# Patient Record
Sex: Male | Born: 1965 | Race: White | Hispanic: No | Marital: Married | Smoking: Former smoker
Health system: Southern US, Community
[De-identification: ages and names within clinical notes are randomized; demographics above are authoritative.]

## PROBLEM LIST (undated history)

## (undated) DIAGNOSIS — I25810 Atherosclerosis of coronary artery bypass graft(s) without angina pectoris: Secondary | ICD-10-CM

## (undated) DIAGNOSIS — H409 Unspecified glaucoma: Secondary | ICD-10-CM

## (undated) DIAGNOSIS — E785 Hyperlipidemia, unspecified: Secondary | ICD-10-CM

## (undated) DIAGNOSIS — I10 Essential (primary) hypertension: Secondary | ICD-10-CM

## (undated) HISTORY — DX: Atherosclerosis of coronary artery bypass graft(s) without angina pectoris: I25.810

## (undated) HISTORY — DX: Hyperlipidemia, unspecified: E78.5

## (undated) HISTORY — PX: NO PAST SURGERIES: SHX2092

---

## 2020-05-30 DIAGNOSIS — Z8673 Personal history of transient ischemic attack (TIA), and cerebral infarction without residual deficits: Secondary | ICD-10-CM

## 2020-05-30 HISTORY — DX: Personal history of transient ischemic attack (TIA), and cerebral infarction without residual deficits: Z86.73

## 2020-07-01 ENCOUNTER — Other Ambulatory Visit: Payer: Self-pay

## 2020-07-01 ENCOUNTER — Inpatient Hospital Stay (HOSPITAL_BASED_OUTPATIENT_CLINIC_OR_DEPARTMENT_OTHER)
Admission: EM | Admit: 2020-07-01 | Discharge: 2020-07-13 | DRG: 234 | Disposition: A | Discharge status: Home / Self Care | Payer: BC Managed Care – PPO | Attending: Cardiothoracic Surgery | Admitting: Cardiothoracic Surgery

## 2020-07-01 ENCOUNTER — Encounter (HOSPITAL_BASED_OUTPATIENT_CLINIC_OR_DEPARTMENT_OTHER): Payer: Self-pay | Admitting: *Deleted

## 2020-07-01 ENCOUNTER — Emergency Department (HOSPITAL_BASED_OUTPATIENT_CLINIC_OR_DEPARTMENT_OTHER): Payer: BC Managed Care – PPO

## 2020-07-01 DIAGNOSIS — J939 Pneumothorax, unspecified: Secondary | ICD-10-CM

## 2020-07-01 DIAGNOSIS — E785 Hyperlipidemia, unspecified: Secondary | ICD-10-CM | POA: Diagnosis present

## 2020-07-01 DIAGNOSIS — I2510 Atherosclerotic heart disease of native coronary artery without angina pectoris: Secondary | ICD-10-CM | POA: Diagnosis present

## 2020-07-01 DIAGNOSIS — Z9689 Presence of other specified functional implants: Secondary | ICD-10-CM

## 2020-07-01 DIAGNOSIS — I6522 Occlusion and stenosis of left carotid artery: Secondary | ICD-10-CM | POA: Diagnosis present

## 2020-07-01 DIAGNOSIS — Z01810 Encounter for preprocedural cardiovascular examination: Secondary | ICD-10-CM

## 2020-07-01 DIAGNOSIS — I214 Non-ST elevation (NSTEMI) myocardial infarction: Principal | ICD-10-CM | POA: Diagnosis present

## 2020-07-01 DIAGNOSIS — Z79899 Other long term (current) drug therapy: Secondary | ICD-10-CM

## 2020-07-01 DIAGNOSIS — Z09 Encounter for follow-up examination after completed treatment for conditions other than malignant neoplasm: Secondary | ICD-10-CM

## 2020-07-01 DIAGNOSIS — Z8616 Personal history of COVID-19: Secondary | ICD-10-CM

## 2020-07-01 DIAGNOSIS — I10 Essential (primary) hypertension: Secondary | ICD-10-CM | POA: Diagnosis present

## 2020-07-01 DIAGNOSIS — H409 Unspecified glaucoma: Secondary | ICD-10-CM | POA: Diagnosis present

## 2020-07-01 DIAGNOSIS — E119 Type 2 diabetes mellitus without complications: Secondary | ICD-10-CM | POA: Diagnosis present

## 2020-07-01 DIAGNOSIS — Z951 Presence of aortocoronary bypass graft: Secondary | ICD-10-CM

## 2020-07-01 DIAGNOSIS — E8770 Fluid overload, unspecified: Secondary | ICD-10-CM | POA: Diagnosis not present

## 2020-07-01 DIAGNOSIS — R1310 Dysphagia, unspecified: Secondary | ICD-10-CM | POA: Diagnosis present

## 2020-07-01 DIAGNOSIS — D62 Acute posthemorrhagic anemia: Secondary | ICD-10-CM | POA: Diagnosis not present

## 2020-07-01 DIAGNOSIS — I161 Hypertensive emergency: Principal | ICD-10-CM | POA: Diagnosis present

## 2020-07-01 DIAGNOSIS — J9811 Atelectasis: Secondary | ICD-10-CM

## 2020-07-01 DIAGNOSIS — Z87891 Personal history of nicotine dependence: Secondary | ICD-10-CM

## 2020-07-01 DIAGNOSIS — Z8249 Family history of ischemic heart disease and other diseases of the circulatory system: Secondary | ICD-10-CM

## 2020-07-01 DIAGNOSIS — D6959 Other secondary thrombocytopenia: Secondary | ICD-10-CM | POA: Diagnosis present

## 2020-07-01 HISTORY — DX: Essential (primary) hypertension: I10

## 2020-07-01 HISTORY — DX: Unspecified glaucoma: H40.9

## 2020-07-01 LAB — CBC WITH DIFFERENTIAL/PLATELET
Abs Immature Granulocytes: 0.01 10*3/uL (ref 0.00–0.07)
Basophils Absolute: 0 10*3/uL (ref 0.0–0.1)
Basophils Relative: 0 %
Eosinophils Absolute: 0.2 10*3/uL (ref 0.0–0.5)
Eosinophils Relative: 3 %
HCT: 46.8 % (ref 39.0–52.0)
Hemoglobin: 15.7 g/dL (ref 13.0–17.0)
Immature Granulocytes: 0 %
Lymphocytes Relative: 28 %
Lymphs Abs: 1.9 10*3/uL (ref 0.7–4.0)
MCH: 28.7 pg (ref 26.0–34.0)
MCHC: 33.5 g/dL (ref 30.0–36.0)
MCV: 85.6 fL (ref 80.0–100.0)
Monocytes Absolute: 0.6 10*3/uL (ref 0.1–1.0)
Monocytes Relative: 9 %
Neutro Abs: 4.1 10*3/uL (ref 1.7–7.7)
Neutrophils Relative %: 60 %
Platelets: 291 10*3/uL (ref 150–400)
RBC: 5.47 MIL/uL (ref 4.22–5.81)
RDW: 13.2 % (ref 11.5–15.5)
WBC: 6.9 10*3/uL (ref 4.0–10.5)
nRBC: 0 % (ref 0.0–0.2)

## 2020-07-01 LAB — COMPREHENSIVE METABOLIC PANEL
ALT: 17 U/L (ref 0–44)
AST: 23 U/L (ref 15–41)
Albumin: 4.2 g/dL (ref 3.5–5.0)
Alkaline Phosphatase: 98 U/L (ref 38–126)
Anion gap: 11 (ref 5–15)
BUN: 20 mg/dL (ref 6–20)
CO2: 24 mmol/L (ref 22–32)
Calcium: 9.5 mg/dL (ref 8.9–10.3)
Chloride: 101 mmol/L (ref 98–111)
Creatinine, Ser: 1.09 mg/dL (ref 0.61–1.24)
GFR, Estimated: >60 mL/min (ref >60)
Glucose, Bld: 101 mg/dL — ABNORMAL HIGH (ref 70–99)
Potassium: 4.1 mmol/L (ref 3.5–5.1)
Sodium: 136 mmol/L (ref 135–145)
Total Bilirubin: 0.6 mg/dL (ref 0.3–1.2)
Total Protein: 7.3 g/dL (ref 6.5–8.1)

## 2020-07-01 LAB — D-DIMER, QUANTITATIVE: D-Dimer, Quant: 0.62 ug/mL-FEU — ABNORMAL HIGH (ref 0.00–0.50)

## 2020-07-01 LAB — TROPONIN I (HIGH SENSITIVITY)
Troponin I (High Sensitivity): 558 ng/L (ref <18)
Troponin I (High Sensitivity): 601 ng/L (ref <18)

## 2020-07-01 LAB — SARS CORONAVIRUS 2 BY RT PCR (HOSPITAL ORDER, PERFORMED IN ~~LOC~~ HOSPITAL LAB): SARS Coronavirus 2: POSITIVE — AB

## 2020-07-01 MED ORDER — NITROGLYCERIN IN D5W 200-5 MCG/ML-% IV SOLN
0.0000 ug/min | INTRAVENOUS | Status: DC
  Administered 2020-07-01: 5 ug/min via INTRAVENOUS
  Administered 2020-07-03 – 2020-07-04 (×2): 30 ug/min via INTRAVENOUS
  Filled 2020-07-01 (×3): qty 250

## 2020-07-01 MED ORDER — HEPARIN (PORCINE) 25000 UT/250ML-% IV SOLN
1450.0000 [IU]/h | INTRAVENOUS | Status: DC
  Administered 2020-07-01: 1100 [IU]/h via INTRAVENOUS
  Administered 2020-07-02: 1350 [IU]/h via INTRAVENOUS
  Filled 2020-07-01 (×2): qty 250

## 2020-07-01 MED ORDER — HEPARIN BOLUS VIA INFUSION
4000.0000 [IU] | Freq: Once | INTRAVENOUS | Status: AC
  Administered 2020-07-01: 4000 [IU] via INTRAVENOUS

## 2020-07-01 NOTE — Progress Notes (Signed)
ANTICOAGULATION CONSULT NOTE - Initial Consult  Pharmacy Consult for heparin Indication: ACS/STEMI  No Known Allergies  Patient Measurements: Height: 5\' 8"  (172.7 cm) Weight: 104.3 kg (230 lb) IBW/kg (Calculated) : 68.4 Heparin Dosing Weight: 91.1  Vital Signs: Temp: 98.1 F (36.7 C) (02/02 1730) BP: 213/126 (02/02 1812) Pulse Rate: 83 (02/02 1812)  Labs: Recent Labs    07/01/20 1739  HGB 15.7  HCT 46.8  PLT 291  CREATININE 1.09  TROPONINIHS 558*    Estimated Creatinine Clearance: 92.8 mL/min (by C-G formula based on SCr of 1.09 mg/dL).   Medical History: Past Medical History:  Diagnosis Date  . Glaucoma   . Hypertension     Assessment: 55 yo male admitted with L sided chest pain which radiates down his L arm.  Reports shortness of breath x1 week.  Troponin on arrival 558. CBC stable.  Goal of Therapy:  Heparin level 0.3-0.7 units/ml Monitor platelets by anticoagulation protocol: Yes   Plan:  Heparin 4000 units bolus x1, then Start heparin infusion at 1100 units/hr  F/u 6 hour HL Monitor CBC, s/sx bleeding  44, PharmD PGY-1 Acute Care Pharmacy Resident Office: [phone] 07/01/2020 6:59 PM

## 2020-07-01 NOTE — ED Notes (Signed)
Date and time results received: 07/01/20 2002 (use smartphrase ".now" to insert current time)  Test: Trop Critical Value: 601  Name of Provider Notified: Rubin Payor   Orders Received? Or Actions Taken?: none

## 2020-07-01 NOTE — ED Notes (Signed)
Spoke with the patient regarding his BP.  He stated that he used to take BP medicine and he stopped taking it 5 years ago because he feels like a zombie.  He has young children then and he prefer to play with them and he decided to stopped taking his blood pressure medicine.

## 2020-07-01 NOTE — ED Notes (Addendum)
Pt sent from UC for hypertension. No complaints of chest pain at this time. Chest pain was last night around midnight. Heat helped. Pt had covid 3 weeks ago

## 2020-07-01 NOTE — ED Notes (Signed)
ED Provider at bedside. 

## 2020-07-01 NOTE — ED Provider Notes (Signed)
[facility] EMERGENCY DEPARTMENT Provider Note   CSN: [id number] Arrival date & time: 07/01/20  1724     History Chief Complaint  Patient presents with  . Chest Pain    Leonard Donovan is a 55 y.o. male.  HPI Patient presents with chest pain.  Has had for the last around 2 weeks on and off.  Pain-free now.  Pain is a pressure in his mid chest.  Will go to left arm and left neck at times.  Also now covers both sides of the chest and both arms although is pain-free at this time.  He has been on and off.  Does not seem to come on with exertion although 2 days ago did have it come on while he is at work but usually episodes come on whenever they want to including sometimes at rest.  States that they last an hour and a half and then will go away.  Not really feeling shortness of breath.  Although was diagnosed with Covid 3 weeks ago.  Had around 10 days of fevers.  No more fevers.  Has a history of hypertension but not on any medicines.  States he tried to go see his primary care doctor but it been so long since he had seen them they would not accept him as a patient.  Went to urgent care and was sent in here.  Has a pressure of 230/130 upon arrival.  No swelling his legs.  No further cough.  No known cardiac history    Past Medical History:  Diagnosis Date  . Glaucoma   . Hypertension     There are no problems to display for this patient.   History reviewed. No pertinent surgical history.     No family history on file.  Social History   Tobacco Use  . Smoking status: Former Games developer  . Smokeless tobacco: Former Clinical biochemist  . Vaping Use: Never used  Substance Use Topics  . Alcohol use: Not Currently  . Drug use: Not Currently    Home Medications Prior to Admission medications   Medication Sig Start Date End Date Taking? Authorizing Provider  Ascorbic Acid (VITAMIN C) 1000 MG tablet Take 1,000 mg by mouth daily.   Yes [provider]  calcium-vitamin  D (OSCAL WITH D) 500-200 MG-UNIT tablet Take 1 tablet by mouth.   Yes [provider]  folic acid-vitamin b complex-vitamin c-selenium-zinc (DIALYVITE) 3 MG TABS tablet Take 1 tablet by mouth daily.   Yes [provider]  zinc gluconate 50 MG tablet Take 50 mg by mouth daily.   Yes [provider]    Allergies    Patient has no known allergies.  Review of Systems   Review of Systems  Constitutional: Negative for appetite change.  HENT: Negative for congestion.   Respiratory: Negative for cough and shortness of breath.   Cardiovascular: Positive for chest pain.  Gastrointestinal: Negative for abdominal pain.  Genitourinary: Negative for flank pain.  Musculoskeletal: Negative for back pain.  Skin: Negative for rash.  Neurological: Negative for weakness.  Psychiatric/Behavioral: Negative for confusion.    Physical Exam Updated Vital Signs BP (!) 213/126   Pulse 83   Temp 98.1 F (36.7 C)   Resp 14   Ht 5\' 8"  (1.727 m)   Wt 104.3 kg   SpO2 95%   BMI 34.97 kg/m   Physical Exam Vitals and nursing note reviewed.  Constitutional:      Appearance: He  is well-developed.  HENT:     Head: Atraumatic.  Cardiovascular:     Rate and Rhythm: Normal rate and regular rhythm.  Pulmonary:     Breath sounds: No decreased breath sounds, wheezing, rhonchi or rales.  Chest:     Chest wall: No tenderness.  Musculoskeletal:     Cervical back: Normal range of motion and neck supple.     Right lower leg: No tenderness. No edema.     Left lower leg: No tenderness. No edema.  Skin:    Capillary Refill: Capillary refill takes less than 2 seconds.  Neurological:     Mental Status: He is alert and oriented to person, place, and time.     ED Results / Procedures / Treatments   Labs (all labs ordered are listed, but only abnormal results are displayed) Labs Reviewed  COMPREHENSIVE METABOLIC PANEL - Abnormal; Notable for the following components:      Result  Value   Glucose, Bld 101 (*)    All other components within normal limits  D-DIMER, QUANTITATIVE (NOT AT [facility]) - Abnormal; Notable for the following components:   D-Dimer, Quant 0.62 (*)    All other components within normal limits  TROPONIN I (HIGH SENSITIVITY) - Abnormal; Notable for the following components:   Troponin I (High Sensitivity) 558 (*)    All other components within normal limits  SARS CORONAVIRUS 2 BY RT PCR (HOSPITAL ORDER, PERFORMED IN [facility])  CBC WITH DIFFERENTIAL/PLATELET  HEPARIN LEVEL (UNFRACTIONATED)  TROPONIN I (HIGH SENSITIVITY)    EKG EKG Interpretation  Date/Time:  Wednesday July 01 2020 17:29:32 EST Ventricular Rate:  87 PR Interval:  158 QRS Duration: 94 QT Interval:  380 QTC Calculation: 457 R Axis:   -2 Text Interpretation: Normal sinus rhythm Possible Left atrial enlargement Borderline ECG baseline wander in V3. No old tracing to compare Confirmed by Benjiman Core [phone]) on 07/01/2020 6:31:25 PM   Radiology DG Chest 2 View  Result Date: 07/01/2020 CLINICAL DATA:  Chest pain.  Shortness of breath EXAM: CHEST - 2 VIEW COMPARISON:  None. FINDINGS: Heart size is normal. No pleural effusion or edema. Bilateral upper lobe predominant hazy opacities are identified. Subsegmental atelectasis noted in the left base. The visualized osseous structures are unremarkable. IMPRESSION: Bilateral upper lobe predominant hazy opacities concerning for multifocal infection. Electronically Signed   By: Signa Kell M.D.   On: 07/01/2020 18:01    Procedures Procedures   Medications Ordered in ED Medications  nitroGLYCERIN 50 mg in dextrose 5 % 250 mL (0.2 mg/mL) infusion (5 mcg/min Intravenous New Bag/Given 07/01/20 1853)  heparin bolus via infusion 4,000 Units (has no administration in time range)  heparin ADULT infusion 100 units/mL (25000 units/240mL) (1,100 Units/hr Intravenous New Bag/Given 07/01/20 1911)    ED Course  I have reviewed  the triage vital signs and the nursing notes.  Pertinent labs & imaging results that were available during my care of the patient were reviewed by me and considered in my medical decision making (see chart for details).    MDM Rules/Calculators/A&P                          Patient presents with chest pain.  Episodes of it last around an hour and a half.  One event may have been exertional but has exertion without episodes and otherwise come on at rest.  Has exerted himself since the potentially exertional episode without any pain.  Initially mid chest or left chest going to the left arm.  Now had involved both sides the chest and both arms.  Does not go to the back.  Not hypoxic.  Did have Covid around 3 weeks ago.  EKG reassuring but troponin is negative and patient is severely hypertensive. Discussed with Dr. Gaynelle Arabian from cardiology.  Thinks patient would be appropriate for his service.  Will start on nitroglycerin drip and heparin drip.  I think likely either hypertensive emergency versus a Covid myocarditis.  Acute vessel occlusion felt less likely but still considered.  Doubt pulmonary embolism.  D-dimer has been ordered through triage, however I think this is mildly elevated only from the Covid infection I do not going to get a CT angiography at this time. Will admit to Cardiology at [facility].  CRITICAL CARE Performed by: Benjiman Core Total critical care time: 30 minutes Critical care time was exclusive of separately billable procedures and treating other patients. Critical care was necessary to treat or prevent imminent or life-threatening deterioration. Critical care was time spent personally by me on the following activities: development of treatment plan with patient and/or surrogate as well as nursing, discussions with consultants, evaluation of patient's response to treatment, examination of patient, obtaining history from patient or surrogate, ordering and performing treatments  and interventions, ordering and review of laboratory studies, ordering and review of radiographic studies, pulse oximetry and re-evaluation of patient's condition.  Final Clinical Impression(s) / ED Diagnoses Final diagnoses:  Hypertensive emergency  NSTEMI (non-ST elevated myocardial infarction) [facility])    Rx / DC Orders ED Discharge Orders    None       Benjiman Core, MD 07/01/20 1930

## 2020-07-01 NOTE — ED Triage Notes (Signed)
C/o left sided chest pain which radiates Down left arm , SOb  multiple episodes x 1 week

## 2020-07-02 ENCOUNTER — Observation Stay (HOSPITAL_COMMUNITY): Payer: BC Managed Care – PPO

## 2020-07-02 ENCOUNTER — Other Ambulatory Visit (HOSPITAL_BASED_OUTPATIENT_CLINIC_OR_DEPARTMENT_OTHER): Payer: Self-pay

## 2020-07-02 ENCOUNTER — Other Ambulatory Visit: Payer: Self-pay

## 2020-07-02 ENCOUNTER — Encounter (HOSPITAL_COMMUNITY)
Admission: EM | Disposition: A | Discharge status: Home / Self Care | Payer: Self-pay | Source: Home / Self Care | Attending: Cardiology

## 2020-07-02 ENCOUNTER — Encounter (HOSPITAL_COMMUNITY): Payer: Self-pay | Admitting: Cardiology

## 2020-07-02 DIAGNOSIS — E785 Hyperlipidemia, unspecified: Secondary | ICD-10-CM | POA: Diagnosis present

## 2020-07-02 DIAGNOSIS — D6959 Other secondary thrombocytopenia: Secondary | ICD-10-CM | POA: Diagnosis present

## 2020-07-02 DIAGNOSIS — Z951 Presence of aortocoronary bypass graft: Secondary | ICD-10-CM | POA: Diagnosis not present

## 2020-07-02 DIAGNOSIS — Z79899 Other long term (current) drug therapy: Secondary | ICD-10-CM | POA: Diagnosis not present

## 2020-07-02 DIAGNOSIS — I2510 Atherosclerotic heart disease of native coronary artery without angina pectoris: Secondary | ICD-10-CM

## 2020-07-02 DIAGNOSIS — I6522 Occlusion and stenosis of left carotid artery: Secondary | ICD-10-CM | POA: Diagnosis present

## 2020-07-02 DIAGNOSIS — E119 Type 2 diabetes mellitus without complications: Secondary | ICD-10-CM | POA: Diagnosis present

## 2020-07-02 DIAGNOSIS — I214 Non-ST elevation (NSTEMI) myocardial infarction: Secondary | ICD-10-CM

## 2020-07-02 DIAGNOSIS — R1310 Dysphagia, unspecified: Secondary | ICD-10-CM | POA: Diagnosis present

## 2020-07-02 DIAGNOSIS — I161 Hypertensive emergency: Secondary | ICD-10-CM | POA: Diagnosis present

## 2020-07-02 DIAGNOSIS — Z8616 Personal history of COVID-19: Secondary | ICD-10-CM | POA: Diagnosis not present

## 2020-07-02 DIAGNOSIS — E8770 Fluid overload, unspecified: Secondary | ICD-10-CM | POA: Diagnosis not present

## 2020-07-02 DIAGNOSIS — H409 Unspecified glaucoma: Secondary | ICD-10-CM | POA: Diagnosis present

## 2020-07-02 DIAGNOSIS — D62 Acute posthemorrhagic anemia: Secondary | ICD-10-CM | POA: Diagnosis not present

## 2020-07-02 DIAGNOSIS — I10 Essential (primary) hypertension: Secondary | ICD-10-CM | POA: Diagnosis present

## 2020-07-02 DIAGNOSIS — Z8249 Family history of ischemic heart disease and other diseases of the circulatory system: Secondary | ICD-10-CM

## 2020-07-02 DIAGNOSIS — Z87891 Personal history of nicotine dependence: Secondary | ICD-10-CM | POA: Diagnosis not present

## 2020-07-02 DIAGNOSIS — E782 Mixed hyperlipidemia: Secondary | ICD-10-CM | POA: Diagnosis not present

## 2020-07-02 DIAGNOSIS — I25110 Atherosclerotic heart disease of native coronary artery with unstable angina pectoris: Secondary | ICD-10-CM | POA: Diagnosis not present

## 2020-07-02 DIAGNOSIS — Z1810 Retained metal fragments, unspecified: Secondary | ICD-10-CM | POA: Diagnosis not present

## 2020-07-02 HISTORY — PX: LEFT HEART CATH AND CORONARY ANGIOGRAPHY: CATH118249

## 2020-07-02 LAB — ECHOCARDIOGRAM COMPLETE
Area-P 1/2: 2.66 cm2
Height: 69 in
S' Lateral: 3.1 cm
Weight: 3489.6 oz

## 2020-07-02 LAB — CBC
HCT: 45.1 % (ref 39.0–52.0)
Hemoglobin: 14.7 g/dL (ref 13.0–17.0)
MCH: 28.7 pg (ref 26.0–34.0)
MCHC: 32.6 g/dL (ref 30.0–36.0)
MCV: 88.1 fL (ref 80.0–100.0)
Platelets: 240 10*3/uL (ref 150–400)
RBC: 5.12 MIL/uL (ref 4.22–5.81)
RDW: 13.3 % (ref 11.5–15.5)
WBC: 8.9 10*3/uL (ref 4.0–10.5)
nRBC: 0 % (ref 0.0–0.2)

## 2020-07-02 LAB — TROPONIN I (HIGH SENSITIVITY): Troponin I (High Sensitivity): 271 ng/L (ref <18)

## 2020-07-02 LAB — HEMOGLOBIN A1C
Hgb A1c MFr Bld: 5.7 % — ABNORMAL HIGH (ref 4.8–5.6)
Mean Plasma Glucose: 116.89 mg/dL

## 2020-07-02 LAB — HEPARIN LEVEL (UNFRACTIONATED)
Heparin Unfractionated: 0.19 IU/mL — ABNORMAL LOW (ref 0.30–0.70)
Heparin Unfractionated: 0.29 IU/mL — ABNORMAL LOW (ref 0.30–0.70)

## 2020-07-02 SURGERY — LEFT HEART CATH AND CORONARY ANGIOGRAPHY
Anesthesia: LOCAL

## 2020-07-02 MED ORDER — HYDRALAZINE HCL 20 MG/ML IJ SOLN
INTRAMUSCULAR | Status: DC | PRN
  Administered 2020-07-02 (×2): 10 mg via INTRAVENOUS

## 2020-07-02 MED ORDER — SODIUM CHLORIDE 0.9 % IV SOLN: 250.0000 mL | INTRAVENOUS | Status: DC | PRN

## 2020-07-02 MED ORDER — HYDRALAZINE HCL 20 MG/ML IJ SOLN
INTRAMUSCULAR | Status: AC
  Filled 2020-07-02: qty 1

## 2020-07-02 MED ORDER — ASPIRIN EC 81 MG PO TBEC
81.0000 mg | DELAYED_RELEASE_TABLET | Freq: Every day | ORAL | Status: DC
  Administered 2020-07-03 – 2020-07-07 (×5): 81 mg via ORAL
  Filled 2020-07-02 (×5): qty 1

## 2020-07-02 MED ORDER — FENTANYL CITRATE (PF) 100 MCG/2ML IJ SOLN
INTRAMUSCULAR | Status: DC | PRN
  Administered 2020-07-02: 25 ug via INTRAVENOUS

## 2020-07-02 MED ORDER — ASPIRIN 300 MG RE SUPP
300.0000 mg | RECTAL | Status: AC
  Filled 2020-07-02: qty 1

## 2020-07-02 MED ORDER — ATORVASTATIN CALCIUM 80 MG PO TABS
80.0000 mg | ORAL_TABLET | Freq: Every day | ORAL | Status: DC
  Administered 2020-07-02 – 2020-07-13 (×11): 80 mg via ORAL
  Filled 2020-07-02 (×11): qty 1

## 2020-07-02 MED ORDER — HYDRALAZINE HCL 20 MG/ML IJ SOLN: 10.0000 mg | INTRAMUSCULAR | Status: AC | PRN

## 2020-07-02 MED ORDER — MIDAZOLAM HCL 2 MG/2ML IJ SOLN
INTRAMUSCULAR | Status: DC | PRN
  Administered 2020-07-02: 2 mg via INTRAVENOUS

## 2020-07-02 MED ORDER — SODIUM CHLORIDE 0.9% FLUSH
3.0000 mL | Freq: Two times a day (BID) | INTRAVENOUS | Status: DC
  Administered 2020-07-05 – 2020-07-07 (×4): 3 mL via INTRAVENOUS

## 2020-07-02 MED ORDER — MIDAZOLAM HCL 2 MG/2ML IJ SOLN
INTRAMUSCULAR | Status: AC
  Filled 2020-07-02: qty 2

## 2020-07-02 MED ORDER — LIDOCAINE HCL (PF) 1 % IJ SOLN
INTRAMUSCULAR | Status: AC
  Filled 2020-07-02: qty 30

## 2020-07-02 MED ORDER — HEPARIN (PORCINE) IN NACL 1000-0.9 UT/500ML-% IV SOLN
INTRAVENOUS | Status: DC | PRN
  Administered 2020-07-02 (×2): 500 mL

## 2020-07-02 MED ORDER — ASPIRIN 81 MG PO CHEW
324.0000 mg | CHEWABLE_TABLET | ORAL | Status: AC
  Administered 2020-07-02: 324 mg via ORAL
  Filled 2020-07-02: qty 4

## 2020-07-02 MED ORDER — ACETAMINOPHEN 325 MG PO TABS: 650.0000 mg | ORAL_TABLET | ORAL | Status: DC | PRN

## 2020-07-02 MED ORDER — HEPARIN (PORCINE) IN NACL 1000-0.9 UT/500ML-% IV SOLN
INTRAVENOUS | Status: AC
  Filled 2020-07-02: qty 1000

## 2020-07-02 MED ORDER — ONDANSETRON HCL 4 MG/2ML IJ SOLN: 4.0000 mg | Freq: Four times a day (QID) | INTRAMUSCULAR | Status: DC | PRN

## 2020-07-02 MED ORDER — NITROGLYCERIN 0.4 MG SL SUBL: 0.4000 mg | SUBLINGUAL_TABLET | SUBLINGUAL | Status: DC | PRN

## 2020-07-02 MED ORDER — VERAPAMIL HCL 2.5 MG/ML IV SOLN
INTRAVENOUS | Status: AC
  Filled 2020-07-02: qty 2

## 2020-07-02 MED ORDER — SODIUM CHLORIDE 0.9% FLUSH: 3.0000 mL | INTRAVENOUS | Status: DC | PRN

## 2020-07-02 MED ORDER — IOHEXOL 350 MG/ML SOLN
INTRAVENOUS | Status: DC | PRN
  Administered 2020-07-02: 70 mL

## 2020-07-02 MED ORDER — HEPARIN (PORCINE) 25000 UT/250ML-% IV SOLN
1450.0000 [IU]/h | INTRAVENOUS | Status: DC
  Filled 2020-07-02: qty 250

## 2020-07-02 MED ORDER — CARVEDILOL 3.125 MG PO TABS
3.1250 mg | ORAL_TABLET | Freq: Two times a day (BID) | ORAL | Status: DC
  Administered 2020-07-02 – 2020-07-04 (×4): 3.125 mg via ORAL
  Filled 2020-07-02 (×4): qty 1

## 2020-07-02 MED ORDER — LIDOCAINE HCL (PF) 1 % IJ SOLN
INTRAMUSCULAR | Status: DC | PRN
  Administered 2020-07-02: 2 mL

## 2020-07-02 MED ORDER — LABETALOL HCL 5 MG/ML IV SOLN: 10.0000 mg | INTRAVENOUS | Status: AC | PRN

## 2020-07-02 MED ORDER — VERAPAMIL HCL 2.5 MG/ML IV SOLN
INTRAVENOUS | Status: DC | PRN
  Administered 2020-07-02: 10 mL via INTRA_ARTERIAL

## 2020-07-02 MED ORDER — HEPARIN BOLUS VIA INFUSION
2000.0000 [IU] | Freq: Once | INTRAVENOUS | Status: AC
  Administered 2020-07-02: 2000 [IU] via INTRAVENOUS

## 2020-07-02 MED ORDER — ACETAMINOPHEN 500 MG PO TABS
1000.0000 mg | ORAL_TABLET | Freq: Once | ORAL | Status: AC
  Administered 2020-07-02: 1000 mg via ORAL
  Filled 2020-07-02: qty 2

## 2020-07-02 MED ORDER — AMLODIPINE BESYLATE 5 MG PO TABS
5.0000 mg | ORAL_TABLET | Freq: Every day | ORAL | Status: DC
  Administered 2020-07-02 – 2020-07-06 (×5): 5 mg via ORAL
  Filled 2020-07-02 (×5): qty 1

## 2020-07-02 MED ORDER — HEPARIN (PORCINE) 25000 UT/250ML-% IV SOLN
2000.0000 [IU]/h | INTRAVENOUS | Status: DC
  Administered 2020-07-02: 22:00:00 1450 [IU]/h via INTRAVENOUS
  Administered 2020-07-03: 1700 [IU]/h via INTRAVENOUS
  Administered 2020-07-04 – 2020-07-08 (×7): 2000 [IU]/h via INTRAVENOUS
  Filled 2020-07-02 (×13): qty 250

## 2020-07-02 MED ORDER — FENTANYL CITRATE (PF) 100 MCG/2ML IJ SOLN
INTRAMUSCULAR | Status: AC
  Filled 2020-07-02: qty 2

## 2020-07-02 MED ORDER — SODIUM CHLORIDE 0.9 % IV SOLN: INTRAVENOUS | Status: AC

## 2020-07-02 SURGICAL SUPPLY — 11 items
BAG SNAP BAND KOVER 36X36 (MISCELLANEOUS) ×2 IMPLANT
CATH 5FR JL3.5 JR4 ANG PIG MP (CATHETERS) ×2 IMPLANT
COVER DOME SNAP 22 D (MISCELLANEOUS) ×2 IMPLANT
DEVICE RAD COMP TR BAND LRG (VASCULAR PRODUCTS) ×2 IMPLANT
GLIDESHEATH SLEND SS 6F .021 (SHEATH) ×2 IMPLANT
GUIDEWIRE INQWIRE 1.5J.035X260 (WIRE) ×1 IMPLANT
INQWIRE 1.5J .035X260CM (WIRE) ×2
KIT HEART LEFT (KITS) ×2 IMPLANT
PACK CARDIAC CATHETERIZATION (CUSTOM PROCEDURE TRAY) ×2 IMPLANT
TRANSDUCER W/STOPCOCK (MISCELLANEOUS) ×2 IMPLANT
TUBING CIL FLEX 10 FLL-RA (TUBING) ×2 IMPLANT

## 2020-07-02 NOTE — ED Notes (Signed)
Report given to [facility] at [place].

## 2020-07-02 NOTE — Interval H&P Note (Signed)
Cath Lab Visit (complete for each Cath Lab visit)  Clinical Evaluation Leading to the Procedure:   ACS: Yes.    Non-ACS:    Anginal Classification: CCS IV  Anti-ischemic medical therapy: Minimal Therapy (1 class of medications)  Non-Invasive Test Results: No non-invasive testing performed  Prior CABG: No previous CABG      History and Physical Interval Note:  07/02/2020 3:11 PM  Leonard Donovan  has presented today for surgery, with the diagnosis of NSTEMI.  The various methods of treatment have been discussed with the patient and family. After consideration of risks, benefits and other options for treatment, the patient has consented to  Procedure(s): LEFT HEART CATH AND CORONARY ANGIOGRAPHY (N/A) as a surgical intervention.  The patient's history has been reviewed, patient examined, no change in status, stable for surgery.  I have reviewed the patient's chart and labs.  Questions were answered to the patient's satisfaction.     Lance Muss

## 2020-07-02 NOTE — Progress Notes (Signed)
ANTICOAGULATION CONSULT NOTE   Pharmacy Consult for heparin Indication: ACS/STEMI  No Known Allergies  Patient Measurements: Height: 5\' 9"  (175.3 cm) Weight: 98.9 kg (218 lb 1.6 oz) IBW/kg (Calculated) : 70.7 Heparin Dosing Weight: 91 kg  Vital Signs: Temp: 97.9 F (36.6 C) (02/03 0957) Temp Source: Oral (02/03 0957) BP: 189/108 (02/03 1100) Pulse Rate: 66 (02/03 1100)  Labs: Recent Labs    07/01/20 1739 07/01/20 1913 07/02/20 0115 07/02/20 0955  HGB 15.7  --   --   --   HCT 46.8  --   --   --   PLT 291  --   --   --   HEPARINUNFRC  --   --  0.19* 0.29*  CREATININE 1.09  --   --   --   TROPONINIHS 558* 601*  --  271*    Estimated Creatinine Clearance: 91.9 mL/min (by C-G formula based on SCr of 1.09 mg/dL).  Assessment: 55 yo male admitted with L sided chest pain which radiates down his L arm.  Reports shortness of breath x1 week.  Troponin on arrival 558. CBC stable.  Heparin level just below goal (0.29) on IV heparin at 1350 units/hr. No issues with line or bleeding reported per RN.  Goal of Therapy:  Heparin level 0.3-0.7 units/ml Monitor platelets by anticoagulation protocol: Yes   Plan:  Increase IV heparin to 1450 units/hr. Continue daily heparin level and CBC. F/u plans for IV heparin after cath today.  44, Reece Leader, BCCP Clinical Pharmacist  07/02/2020 1:09 PM   [facility] pharmacy phone numbers are listed on [email or web address]

## 2020-07-02 NOTE — Progress Notes (Signed)
TCTS consulted for CABG evaluation. °

## 2020-07-02 NOTE — Progress Notes (Signed)
ANTICOAGULATION CONSULT NOTE   Pharmacy Consult for heparin Indication: ACS/STEMI  No Known Allergies  Patient Measurements: Height: 5\' 9"  (175.3 cm) Weight: 98.9 kg (218 lb 1.6 oz) IBW/kg (Calculated) : 70.7 Heparin Dosing Weight: 91 kg  Vital Signs: Temp: 98.9 F (37.2 C) (02/03 1400) Temp Source: Oral (02/03 1400) BP: 121/71 (02/03 1800) Pulse Rate: 88 (02/03 1800)  Labs: Recent Labs    07/01/20 1739 07/01/20 1913 07/02/20 0115 07/02/20 0955 07/02/20 1453  HGB 15.7  --   --   --  14.7  HCT 46.8  --   --   --  45.1  PLT 291  --   --   --  240  HEPARINUNFRC  --   --  0.19* 0.29*  --   CREATININE 1.09  --   --   --   --   TROPONINIHS 558* 601*  --  271*  --     Estimated Creatinine Clearance: 91.9 mL/min (by C-G formula based on SCr of 1.09 mg/dL).  Assessment: 55 yo male admitted with L sided chest pain which radiates down his L arm.  Reports shortness of breath x1 week.  Troponin on arrival 558. CBC stable.  Heparin level just below goal (0.29) on IV heparin at 1350 units/hr. No issues with line or bleeding reported per RN.  Pt is s/p cath with multi-vessels dz. Plan for TCTS consult. D/w 44 and will resume heparin 6 hr post sheath removal.   Goal of Therapy:  Heparin level 0.3-0.7 units/ml Monitor platelets by anticoagulation protocol: Yes   Plan:  Resume IV heparin 1450 units/hr at 2130 tonight Continue daily heparin level and CBC.  2131, PharmD, BCIDP, AAHIVP, CPP Infectious Disease Pharmacist 07/02/2020 7:11 PM

## 2020-07-02 NOTE — Progress Notes (Signed)
  Echocardiogram 2D Echocardiogram has been performed.  Leonard Donovan 07/02/2020, 2:22 PM

## 2020-07-02 NOTE — Progress Notes (Signed)
ANTICOAGULATION CONSULT NOTE   Pharmacy Consult for heparin Indication: ACS/STEMI  No Known Allergies  Patient Measurements: Height: 5\' 8"  (172.7 cm) Weight: 104.3 kg (230 lb) IBW/kg (Calculated) : 68.4 Heparin Dosing Weight: 91 kg  Vital Signs: Temp: 98.1 F (36.7 C) (02/02 1730) BP: 165/102 (02/03 0230) Pulse Rate: 59 (02/03 0230)  Labs: Recent Labs    07/01/20 1739 07/01/20 1913 07/02/20 0115  HGB 15.7  --   --   HCT 46.8  --   --   PLT 291  --   --   HEPARINUNFRC  --   --  0.19*  CREATININE 1.09  --   --   TROPONINIHS 558* 601*  --     Estimated Creatinine Clearance: 92.8 mL/min (by C-G formula based on SCr of 1.09 mg/dL).  Assessment: 55 yo male admitted with L sided chest pain which radiates down his L arm.  Reports shortness of breath x1 week.  Troponin on arrival 558. CBC stable.  Heparin level subtherapeutic (0.19) on gtt at 1100 units/hr. No issues with line or bleeding reported per RN.  Goal of Therapy:  Heparin level 0.3-0.7 units/ml Monitor platelets by anticoagulation protocol: Yes   Plan:  Rebolus heparin 2000 units and increase gtt to 1350 units/hr Will f/u 6hr heparin level  44, PharmD, BCPS Please see amion for complete clinical pharmacist phone list 07/02/2020 3:27 AM

## 2020-07-02 NOTE — H&P (Addendum)
Cardiology Admission History and Physical:   Patient ID: LANNIE MOGUL MRN: [id number]; DOB: [birth year]   Admission date: 07/01/2020  Primary Care Provider: Patient, No Pcp Per [facility] Cardiologist: New to [facility]. [facility] Electrophysiologist:  None   Chief Complaint: chest pain  Patient Profile:   BALDEMAR WOLFROM is a 55 y.o. male with a history of hypertension, glaucoma, and recent COVID infection (diagnosed on 06/18/2020) but no known cardiac history although he has not been to a medical provider in about 20 years. He was seen at an Urgent Care yesterday for further evaluation of intermittent chest pain and was found to be markedly hypertensive. He was sent to the ED for further evaluation and ruled in for NSTEMI.   History of Present Illness:   Mr. Gladd is a 55 year old male with a history of hypertension, glaucoma, and recent COVID infection (diagnosed on 06/18/2020) but no known cardiac history.  However, he has not seen a medical provider in probably 20 years until last month when he was [facility].  Although he has a history of hypertension he has not been on any medications.  He recently started taking multiple vitamins after being diagnosed with Covid but is on no other medications at home.  He has no known history of hyperlipidemia or diabetes.  He is a former smoker but quit about 30 years ago.  No other alcohol or drug use.  He does have a family history of heart disease with his sister having known CAD ultimately requiring CABG (first diagnosed with CAD in her 9's).  His father has a history of hypertension.  Patient started having symptoms of COVID on 06/07/2020. Symptoms included fever, fatigue/decreased energy, and mild nasal congestion/cough.  Symptoms persisted and [facility] ultimately [facility] where he tested positive for COVID.  He still has a mild productive cough but otherwise has recovered.  No more fevers.  He  does report intermittent diffuse chest pain over the past 2 weeks that radiates to bilateral shoulders at times.  He describes the pain as a burning sensation and ranks it as an 8 out of 10 at the worst.  He has associated shortness of breath and numbness in his hands with this as well as occasional diaphoresis.  He has also noted some neck/jaw pain he cannot say if it is always related to his chest pain.  No nausea/vomiting.  Pain has been getting worse over the past 2 weeks and becoming more frequent.  It occurs both at rest and with activity.  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  And on Tuesday night he had multiple episodes of pain throughout the night that was worse with laying down.  Given progressive symptoms, he went to an Urgent Care yesterday for further evaluation and was noted to be markedly hypertensive.  Therefore, he was sent to the ED for further work-up.  He is currently chest pain free. He does report some shortness of breath at night when laying flat on his back as well as waking up all of a sudden feeling short of breath.  However it sounds like he probably has undiagnosed sleep apnea rather than true orthopnea/PND.  His wife does report snoring when he lays on his back.  No lower extremity edema.  He has had some intermittent dizziness lately but no syncope.   In the ED, patient  markedly hypertensive with BP as high as 234/128. Otherwise, vitals stable. EKG showed normal sinus rhythm with no acute ischemic changes. High-sensitivity troponin elevated at 558 >> 601. Chest x-ray showed bilateral upper lobe predominant hazy opacities concerning for multifocal infection. WBC 6.9, Hgb 15.7, Plts 13.2. Na 136, K 4.1, Glucose 101, BUN 20, Cr 1.09. LFT normal. D-dimer minimally elevated at 0.62. COVID-19 still positive (but not felt to be acute infection). He was started on IV Heparin and admitted for further evaluation.    Past Medical History:   Diagnosis Date  . Glaucoma   . Hypertension     Past Surgical History:  Procedure Laterality Date  . NO PAST SURGERIES       Medications Prior to Admission: Prior to Admission medications   Medication Sig Start Date End Date Taking? Authorizing Provider  Ascorbic Acid (VITAMIN C) 1000 MG tablet Take 1,000 mg by mouth daily.   Yes [provider]  cholecalciferol (VITAMIN D3) 25 MCG (1000 UNIT) tablet Take 1,000 Units by mouth daily.   Yes [provider]  guaiFENesin (MUCINEX) 600 MG 12 hr tablet Take 600 mg by mouth 2 (two) times daily as needed for cough or to loosen phlegm.   Yes [provider]  Multiple Vitamins-Minerals (MULTIVITAMIN WITH MINERALS) tablet Take 1 tablet by mouth daily.   Yes [provider]  vitamin B-12 (CYANOCOBALAMIN) 1000 MCG tablet Take 1,000 mcg by mouth daily.   Yes [provider]  zinc gluconate 50 MG tablet Take 50 mg by mouth daily.   Yes [provider]     Allergies:   No Known Allergies  Social History:   Social History   Socioeconomic History  . Marital status: Married    Spouse name: Not on file  . Number of children: Not on file  . Years of education: Not on file  . Highest education level: Not on file  Occupational History  . Not on file  Tobacco Use  . Smoking status: Former Games developer  . Smokeless tobacco: Former Clinical biochemist  . Vaping Use: Never used  Substance and Sexual Activity  . Alcohol use: Not Currently  . Drug use: Not Currently  . Sexual activity: Not on file  Other Topics Concern  . Not on file  Social History Narrative  . Not on file   Social Determinants of Health   Financial Resource Strain: Not on file  Food Insecurity: Not on file  Transportation Needs: Not on file  Physical Activity: Not on file  Stress: Not on file  Social Connections: Not on file  Intimate Partner Violence: Not on file    Family History:   The patient's family history includes  CAD in his sister; Heart attack in his sister; Hypertension in his father.    ROS:  Please see the history of present illness.  All other ROS reviewed and negative.     Physical Exam/Data:   Vitals:   07/02/20 0730 07/02/20 0945 07/02/20 0957 07/02/20 1100  BP: (!) 156/98 (!) 187/104 (!) 187/104 (!) 189/108  Pulse: 65 76 76 66  Resp: 19 20 20  (!) 21  Temp:  97.9 F (36.6 C) 97.9 F (36.6 C)   TempSrc:  Oral Oral   SpO2: 96% 98% 98% 96%  Weight:  98.9 kg 98.9 kg   Height:  5\' 9"  (1.753 m) 5\' 9"  (1.753 m)     Intake/Output Summary (Last 24 hours) at 07/02/2020 1224 Last data filed at 07/02/2020  1220 Gross per 24 hour  Intake 282.92 ml  Output 300 ml  Net -17.08 ml   Last 3 Weights 07/02/2020 07/02/2020 07/01/2020  Weight (lbs) 218 lb 1.6 oz 218 lb 1.6 oz 230 lb  Weight (kg) 98.93 kg 98.93 kg 104.327 kg     Body mass index is 32.21 kg/m.  General: 55 y.o. male resting comfortably in no acute distress. HEENT: Normocephalic and atraumatic. Sclera clear.  Neck: Supple. No carotid bruits. No JVD. Heart: RRR. Distinct S1 and S2. No murmurs, gallops, or rubs. Radial pulses 2+ and equal bilaterally. Lungs: No increased work of breathing. Clear to ausculation bilaterally. No wheezes, rhonchi, or rales.  Abdomen: Soft, non-distended, and non-tender to palpation. Bowel sounds present. Extremities: No lower extremity edema.    Skin: Warm and dry. Neuro: Alert and oriented x3. No focal deficits. Psych: Normal affect. Responds appropriately.   EKG:  The ECG that was done was personally reviewed and demonstrates normal sinus rhythm, rate 86 bpm, with mild J point elevation in V2 (not consistent with STEMI) and otherwise non-specific ST/T changes.  Telemetry: Telemetry personally reviewed and demonstrates normal sinus rhythm with rates in the 60's to 70's.  Relevant CV Studies: N/A.  Laboratory Data:  High Sensitivity Troponin:   Recent Labs  Lab 07/01/20 1739 07/01/20 1913   TROPONINIHS 558* 601*      Chemistry Recent Labs  Lab 07/01/20 1739  NA 136  K 4.1  CL 101  CO2 24  GLUCOSE 101*  BUN 20  CREATININE 1.09  CALCIUM 9.5  GFRNONAA >60  ANIONGAP 11    Recent Labs  Lab 07/01/20 1739  PROT 7.3  ALBUMIN 4.2  AST 23  ALT 17  ALKPHOS 98  BILITOT 0.6   Hematology Recent Labs  Lab 07/01/20 1739  WBC 6.9  RBC 5.47  HGB 15.7  HCT 46.8  MCV 85.6  MCH 28.7  MCHC 33.5  RDW 13.2  PLT 291   BNPNo results for input(s): BNP, PROBNP in the last 168 hours.  DDimer  Recent Labs  Lab 07/01/20 1739  DDIMER 0.62*     Radiology/Studies:  DG Chest 2 View  Result Date: 07/01/2020 CLINICAL DATA:  Chest pain.  Shortness of breath EXAM: CHEST - 2 VIEW COMPARISON:  None. FINDINGS: Heart size is normal. No pleural effusion or edema. Bilateral upper lobe predominant hazy opacities are identified. Subsegmental atelectasis noted in the left base. The visualized osseous structures are unremarkable. IMPRESSION: Bilateral upper lobe predominant hazy opacities concerning for multifocal infection. Electronically Signed   By: Signa Kellaylor  Stroud M.D.   On: 07/01/2020 18:01   Assessment and Plan:   NSTEMI vs. Demand Ischemia - Patient presented with intermittent chest pain for the last 2 weeks that has been increasing in severity and frequency. Occurs both at rest and with activity.Currently chest pain free. - EKG showed no acute ischemic changes. - High-sensitivity troponin elevated at 558 >> 601.  - D-dimer minimally elevated at 0.61. - Will order Echo. - Continue IV Heparin. IV Nitro. - Will check lipid panel and hemoglobin A1c. - Will start aspirin, beta-blocker, and high-intensity statin. - Patient has both typical and typical features. Possibly demand ischemia in setting of hypertensive emergency. Also may be a component of myopericarditis given recent COVID infection and worse when laying down. However, I think we need to rule out ACS. Discussed with MD who  agrees. Will proceed with cardiac catheterization. The patient understands that risks include but are not limited to stroke (  1 in 1000), death (1 in 1000), kidney failure [usually temporary] (1 in 500), bleeding (1 in 200), allergic reaction [possibly serious] (1 in 200), and agrees to proceed.   Hypertensive Emergency - BP markedly elevated at 234/128. - Currently on IV Nitro and BP 189/108. Can continue this for now for BP control. - Will add Amlodipine 5mg  daily and Coreg 3.125mg  twice daily.   Recent COVID-19 Infection - Symptoms started on 06/07/2020. [facility]. He a lingering mild cough but otherwise symptoms resolved. No fevers. - D-dimer slightly elevated. - Chest x-ray showed bilateral upper lobe predominant hazy opacities concerning for possible multifocal infection. However, patient afebrile and WBC normal. Patient denies any shortness of breath outside of chest pain episode. Don't think any other work-up is necessary.  Risk Assessment/Risk Scores:   TIMI Risk Score for Unstable Angina or Non-ST Elevation MI:   The patient's TIMI risk score is 2, which indicates a 8% risk of all cause mortality, new or recurrent myocardial infarction or need for urgent revascularization in the next 14 days.{  Severity of Illness: The appropriate patient status for this patient is OBSERVATION. Observation status is judged to be reasonable and necessary in order to provide the required intensity of service to ensure the patient's safety. The patient's presenting symptoms, physical exam findings, and initial radiographic and laboratory data in the context of their medical condition is felt to place them at decreased risk for further clinical deterioration. Furthermore, it is anticipated that the patient will be medically stable for discharge from the hospital within 2 midnights of admission. The following factors support the patient status of observation.   " The  patient's presenting symptoms include chest pain. " The physical exam findings as above. " The initial radiographic and laboratory data are elevated troponin.  For questions or updates, please contact [facility] Please consult [email or web address] for contact info under    Signed, 06/20/2020, PA-C  07/02/2020 12:24 PM   The patient was seen, examined and discussed with 08/30/2020, PA-C  and I agree with the above.   55 y.o. male with a history of hypertension, glaucoma, and recent COVID infection (diagnosed on 06/18/2020) but no known cardiac history although he has not been to a medical provider in about 20 years. He was seen at an Urgent Care yesterday for further evaluation of intermittent chest pain and was found to be markedly hypertensive. He was sent to the ED for further evaluation and ruled in for NSTEMI.  Troponin elevation 558, 601, 271. The patient states that he has family history of premature coronary artery disease, his sister underwent bypass surgery in her 93s, he also has history of smoking about 30 years ago.  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  His symptoms get significantly worse after he acquired Covid infection on January 20 is.  He has been experiencing chest pressure shortness of breath and chest pain radiating down his arms. He is EKG shows sinus rhythm nonspecific ST-T wave abnormalities.  His echocardiogram is pending.  He is normal creatinine, hemoglobin and platelet count.  On presentation he had severely elevated blood pressure. This might represent hypertensive urgency, however with family history of premature coronary artery disease, risk factors and elevated troponin will proceed with cardiac catheterization.  Physical exam he is not in acute distress, he has no JVDs, S1-S2 no  murmur clear lungs and warm extremities with good pulses and no edema.  We will  start aspirin, high-dose statin, low-dose carvedilol, we will start amlodipine 5 mg daily and uptitrate as needed.  We might add these apart depending on his LVEF.  Tobias Alexander, MD 07/02/2020

## 2020-07-03 ENCOUNTER — Encounter (HOSPITAL_COMMUNITY): Payer: Self-pay | Admitting: Interventional Cardiology

## 2020-07-03 ENCOUNTER — Inpatient Hospital Stay (HOSPITAL_COMMUNITY): Payer: BC Managed Care – PPO

## 2020-07-03 DIAGNOSIS — Z1810 Retained metal fragments, unspecified: Secondary | ICD-10-CM

## 2020-07-03 DIAGNOSIS — I214 Non-ST elevation (NSTEMI) myocardial infarction: Secondary | ICD-10-CM | POA: Diagnosis not present

## 2020-07-03 DIAGNOSIS — I161 Hypertensive emergency: Secondary | ICD-10-CM | POA: Diagnosis not present

## 2020-07-03 DIAGNOSIS — Z8616 Personal history of COVID-19: Secondary | ICD-10-CM

## 2020-07-03 DIAGNOSIS — I10 Essential (primary) hypertension: Secondary | ICD-10-CM

## 2020-07-03 DIAGNOSIS — I25110 Atherosclerotic heart disease of native coronary artery with unstable angina pectoris: Secondary | ICD-10-CM

## 2020-07-03 DIAGNOSIS — E782 Mixed hyperlipidemia: Secondary | ICD-10-CM | POA: Diagnosis not present

## 2020-07-03 LAB — BASIC METABOLIC PANEL
Anion gap: 10 (ref 5–15)
BUN: 10 mg/dL (ref 6–20)
CO2: 22 mmol/L (ref 22–32)
Calcium: 8.7 mg/dL — ABNORMAL LOW (ref 8.9–10.3)
Chloride: 107 mmol/L (ref 98–111)
Creatinine, Ser: 1.05 mg/dL (ref 0.61–1.24)
GFR, Estimated: >60 mL/min (ref >60)
Glucose, Bld: 109 mg/dL — ABNORMAL HIGH (ref 70–99)
Potassium: 3.6 mmol/L (ref 3.5–5.1)
Sodium: 139 mmol/L (ref 135–145)

## 2020-07-03 LAB — PULMONARY FUNCTION TEST
FEF 25-75 Pre: 5.6 L/sec
FEF2575-%Pred-Pre: 170 %
FEV1-%Pred-Pre: 99 %
FEV1-Pre: 3.74 L
FEV1FVC-%Pred-Pre: 113 %
FEV6-%Pred-Pre: 90 %
FEV6-Pre: 4.24 L
FEV6FVC-%Pred-Pre: 103 %
FVC-%Pred-Pre: 87 %
FVC-Pre: 4.25 L
Pre FEV1/FVC ratio: 88 %
Pre FEV6/FVC Ratio: 100 %

## 2020-07-03 LAB — HEPARIN LEVEL (UNFRACTIONATED)
Heparin Unfractionated: <0.1 IU/mL — ABNORMAL LOW (ref 0.30–0.70)
Heparin Unfractionated: 0.13 IU/mL — ABNORMAL LOW (ref 0.30–0.70)
Heparin Unfractionated: 0.44 IU/mL (ref 0.30–0.70)

## 2020-07-03 LAB — LIPID PANEL
Cholesterol: 149 mg/dL (ref 0–200)
HDL: 34 mg/dL — ABNORMAL LOW (ref >40)
LDL Cholesterol: 99 mg/dL (ref 0–99)
Total CHOL/HDL Ratio: 4.4 RATIO
Triglycerides: 82 mg/dL (ref <150)
VLDL: 16 mg/dL (ref 0–40)

## 2020-07-03 LAB — CBC
HCT: 41.4 % (ref 39.0–52.0)
Hemoglobin: 13.9 g/dL (ref 13.0–17.0)
MCH: 28.7 pg (ref 26.0–34.0)
MCHC: 33.6 g/dL (ref 30.0–36.0)
MCV: 85.4 fL (ref 80.0–100.0)
Platelets: 198 10*3/uL (ref 150–400)
RBC: 4.85 MIL/uL (ref 4.22–5.81)
RDW: 13.3 % (ref 11.5–15.5)
WBC: 8.1 10*3/uL (ref 4.0–10.5)
nRBC: 0 % (ref 0.0–0.2)

## 2020-07-03 NOTE — Progress Notes (Signed)
CARDIAC REHAB PHASE I   PRE:  Rate/Rhythm: 77 SR    BP: sitting 149/93    SaO2: 96 RA  MODE:  Ambulation: 800 ft   POST:  Rate/Rhythm: 88 SR    BP: sitting 134/103     SaO2: 98 RA  Tolerated well, thankful to walk. No angina, on NTG. Will f/u later for preop (resp in for PFTs). [id number]   Harriet Masson CES, ACSM 07/03/2020 11:41 AM

## 2020-07-03 NOTE — Progress Notes (Signed)
ANTICOAGULATION CONSULT NOTE   Pharmacy Consult for heparin Indication: ACS/STEMI  No Known Allergies  Patient Measurements: Height: 5\' 9"  (175.3 cm) Weight: 96.7 kg (213 lb 3.2 oz) (scale b) IBW/kg (Calculated) : 70.7 Heparin Dosing Weight: 91 kg  Vital Signs: Temp: 97.5 F (36.4 C) (02/04 0313) Temp Source: Oral (02/04 0313) BP: 156/93 (02/04 0313) Pulse Rate: 73 (02/04 0313)  Labs: Recent Labs    07/01/20 1739 07/01/20 1913 07/02/20 0115 07/02/20 0955 07/02/20 1453 07/03/20 0401  HGB 15.7  --   --   --  14.7 13.9  HCT 46.8  --   --   --  45.1 41.4  PLT 291  --   --   --  240 198  HEPARINUNFRC  --   --  0.19* 0.29*  --  <0.10*  CREATININE 1.09  --   --   --   --   --   TROPONINIHS 558* 601*  --  271*  --   --     Estimated Creatinine Clearance: 90.9 mL/min (by C-G formula based on SCr of 1.09 mg/dL).  Assessment: 55 yo male s/p cath with multi-vessels dz. Plan for TCTS consult. Heparin resumed post sheath removal.   Heparin level undetectable on 1450 units/hr. No issues with line or bleeding reported per RN.  Goal of Therapy:  Heparin level 0.3-0.7 units/ml Monitor platelets by anticoagulation protocol: Yes   Plan:  Increase IV heparin to 1700 units/hr Will f/u 6 hr heparin level  44, PharmD, BCPS Please see amion for complete clinical pharmacist phone list 07/03/2020 5:06 AM

## 2020-07-03 NOTE — Progress Notes (Signed)
ANTICOAGULATION CONSULT NOTE   Pharmacy Consult for heparin Indication: ACS/STEMI  No Known Allergies  Patient Measurements: Height: 5\' 9"  (175.3 cm) Weight: 96.7 kg (213 lb 3.2 oz) (scale b) IBW/kg (Calculated) : 70.7 Heparin Dosing Weight: 91 kg  Vital Signs: Temp: 99 F (37.2 C) (02/04 1941) Temp Source: Oral (02/04 1941) BP: 159/87 (02/04 1941) Pulse Rate: 88 (02/04 1941)  Labs: Recent Labs    07/01/20 1739 07/01/20 1913 07/02/20 0115 07/02/20 0955 07/02/20 1453 07/03/20 0401 07/03/20 1202 07/03/20 2150  HGB 15.7  --   --   --  14.7 13.9  --   --   HCT 46.8  --   --   --  45.1 41.4  --   --   PLT 291  --   --   --  240 198  --   --   HEPARINUNFRC  --   --    < > 0.29*  --  <0.10* 0.13* 0.44  CREATININE 1.09  --   --   --   --  1.05  --   --   TROPONINIHS 558* 601*  --  271*  --   --   --   --    < > = values in this interval not displayed.    Estimated Creatinine Clearance: 94.4 mL/min (by C-G formula based on SCr of 1.05 mg/dL).  Assessment: 55 yo male s/p cath with multi-vessels dz.. Heparin resumed post sheath removal. CABG planned for Tuesday  Heparin level therapeutic (0.44) on gtt at 2000 units/hr. No bleeding noted.  Goal of Therapy:  Heparin level 0.3-0.7 units/ml Monitor platelets by anticoagulation protocol: Yes   Plan:  Continue heparin at 2000 units / hr F/u daily heparin level and CBC  Tuesday, PharmD, BCPS Please see amion for complete clinical pharmacist phone list 07/03/2020 10:44 PM

## 2020-07-03 NOTE — Consult Note (Addendum)
[address]       [place] [address]             [phone]        Leonard Donovan [facility] Medical Record #[id number] Date of Birth: [birth year]  Referring: No ref. provider found Primary Care: Patient, No Pcp Per Primary Cardiologist:No primary care provider on file.  Chief Complaint:    Chief Complaint  Patient presents with  . Chest Pain    History of Present Illness:      Leonard Donovan is a 55 year old male patient with a past medical history significant for hypertension, glaucoma, and recent Covid infection which was diagnosed on 06/18/2020.  He has no known history of hyperlipidemia or diabetes.  He is a former smoker but has not smoked in about 30 years.  He does have a positive family history for heart disease with his  sister having known coronary artery disease and ultimately required CABG.  She was first diagnosed with coronary artery disease in her 19s.  He presented on 07/02/2020 to the [facility] emergency department with diffuse chest pain over the past 2 weeks that radiates to bilateral shoulders at times.  His pain has really been occurring for the last two years (since COVID) but it started only with a lot of exertion. He does have associated shortness of breath and numbness in his hands as well as occasional diaphoresis.  The pain is a burning sensation and is an 8 out of 10 at its worst.  The pain had been getting worse over the past 2 weeks and becoming more frequent.  It occurs at both rest and with activity at this point.  He also reports some shortness of breath while lying flat on his back as well as waking up all of a sudden with a feeling of shortness of breath during the night.  Due to his strong family history and symptoms he was taken to the Cath Lab and underwent a cardiac catheterization on 07/02/2020 which showed proximal LAD lesion of 75% stenosis with diffuse disease down the vessel, mid circumflex lesion of 80% with a first OM branch of 75%  stenosis, proximal RCA lesion of 80% stenosis with a right PDA branch of 75% stenosis and an additional right PDA branch with 50% stenosis, lastly a right  atrioventriclar artery with 50% stenosis. Recent Echocardiogram shows EF of 60-65% and mild mitral valve regurgitation with no other valvular abnormalities.  We are consulted for possible surgical revascularization.  Leonard Donovan lives in [place] with his wife and three kids ages 40, 45, and 35. 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 He has lead an active lifestyle until he was diagnosed with COVID in January. He had fevers and shortness of breath with this infection. After about two weeks he began to feel better but his wife continues to worry about post COVID pneumonia. Chest xray was reviewed with the patient and wife at the bedside.     Current Activity/ Functional Status: Patient was independent with mobility/ambulation, transfers, ADL's, IADL's.   Zubrod Score: At the time of surgery this patient's most appropriate activity status/level should be described as: []     0    Normal activity, no symptoms [x]     1    Restricted in physical strenuous activity but ambulatory, able to do out light work []     2    Ambulatory and capable of self care, unable to do work activities,  up and about                 more than 50%  Of the time                            []     3    Only limited self care, in bed greater than 50% of waking hours []     4    Completely disabled, no self care, confined to bed or chair []     5    Moribund  Past Medical History:  Diagnosis Date  . Glaucoma   . Hypertension     Past Surgical History:  Procedure Laterality Date  . LEFT HEART CATH AND CORONARY ANGIOGRAPHY N/A 07/02/2020   Procedure: LEFT HEART CATH AND CORONARY ANGIOGRAPHY;  Surgeon: , MD;  Location: [facility] INVASIVE CV LAB;  Service: Cardiovascular;  Laterality: N/A;  . NO PAST SURGERIES      Social History   Tobacco Use  Smoking  Status Former Smoker  Smokeless Tobacco Former 08/30/2020    Social History   Substance and Sexual Activity  Alcohol Use Not Currently     No Known Allergies  Current Facility-Administered Medications  Medication Dose Route Frequency Provider Last Rate Last Admin  . 0.9 %  sodium chloride infusion  250 mL Intravenous PRN Corky Crafts, MD      . acetaminophen (TYLENOL) tablet 650 mg  650 mg Oral Q4H PRN [facility], MD      . amLODipine (NORVASC) tablet 5 mg  5 mg Oral Daily Neurosurgeon, MD   5 mg at 07/03/20 0910  . aspirin EC tablet 81 mg  81 mg Oral Daily Corky Crafts, MD   81 mg at 07/03/20 0910  . atorvastatin (LIPITOR) tablet 80 mg  80 mg Oral Daily 08/31/20, MD   80 mg at 07/03/20 0910  . carvedilol (COREG) tablet 3.125 mg  3.125 mg Oral BID WC 08/31/20, MD   3.125 mg at 07/03/20 0910  . heparin ADULT infusion 100 units/mL (25000 units/260mL)  1,700 Units/hr Intravenous Continuous Corky Crafts, RPH 17 mL/hr at 07/03/20 0547 1,700 Units/hr at 07/03/20 0547  . nitroGLYCERIN (NITROSTAT) SL tablet 0.4 mg  0.4 mg Sublingual Q5 Min x 3 PRN Titus Mould, MD      . nitroGLYCERIN 50 mg in dextrose 5 % 250 mL (0.2 mg/mL) infusion  0-200 mcg/min Intravenous Continuous 08/31/20, MD 9 mL/hr at 07/03/20 0110 30 mcg/min at 07/03/20 0110  . ondansetron (ZOFRAN) injection 4 mg  4 mg Intravenous Q6H PRN Corky Crafts S, MD      . sodium chloride flush (NS) 0.9 % injection 3 mL  3 mL Intravenous Q12H 08/31/20 S, MD      . sodium chloride flush (NS) 0.9 % injection 3 mL  3 mL Intravenous PRN 08/31/20, MD        Medications Prior to Admission  Medication Sig Dispense Refill Last Dose  . Ascorbic Acid (VITAMIN C) 1000 MG tablet Take 1,000 mg by mouth daily.   06/30/2020  . cholecalciferol (VITAMIN D3) 25 MCG (1000 UNIT) tablet Take 1,000 Units by mouth daily.   06/30/2020  . guaiFENesin (MUCINEX) 600 MG  12 hr tablet Take 600 mg by mouth 2 (two) times daily as needed for cough or to loosen phlegm.   Past Week at Unknown  time  . Multiple Vitamins-Minerals (MULTIVITAMIN WITH MINERALS) tablet Take 1 tablet by mouth daily.   06/30/2020  . vitamin B-12 (CYANOCOBALAMIN) 1000 MCG tablet Take 1,000 mcg by mouth daily.   06/30/2020  . zinc gluconate 50 MG tablet Take 50 mg by mouth daily.   06/30/2020    Family History  Problem Relation Age of Onset  . Hypertension Father   . CAD Sister        diagnosed in her 23s  . Heart attack Sister      Review of Systems:   Review of Systems  Constitutional: Negative for chills, fever, malaise/fatigue and weight loss.  Respiratory: Positive for cough and shortness of breath.   Cardiovascular: Positive for orthopnea and PND. Negative for chest pain and leg swelling.   Pertinent items are noted in HPI.    Physical Exam: BP (!) 148/88 (BP Location: Right Arm)   Pulse 73   Temp 98.8 F (37.1 C) (Oral)   Resp 18   Ht 5\' 9"  (1.753 m)   Wt 96.7 kg Comment: scale b  SpO2 94%   BMI 31.48 kg/m    General appearance: alert, cooperative and no distress Resp: clear to auscultation bilaterally Cardio: regular rate and rhythm, S1, S2 normal, no murmur, click, rub or gallop GI: soft, non-tender; bowel sounds normal; no masses,  no organomegaly Extremities: extremities normal, atraumatic, no cyanosis or edema Neurologic: Grossly normal Right and left legs warm and well perfused. No abnormalities. Will probably work for .   Diagnostic Studies & Laboratory data:     Recent Radiology Findings:   DG Chest 2 View  Result Date: 07/01/2020 CLINICAL DATA:  Chest pain.  Shortness of breath EXAM: CHEST - 2 VIEW COMPARISON:  None. FINDINGS: Heart size is normal. No pleural effusion or edema. Bilateral upper lobe predominant hazy opacities are identified. Subsegmental atelectasis noted in the left base. The visualized osseous structures are unremarkable.  IMPRESSION: Bilateral upper lobe predominant hazy opacities concerning for multifocal infection. Electronically Signed   By: 08/29/2020 M.D.   On: 07/01/2020 18:01   CARDIAC CATHETERIZATION  Result Date: 07/02/2020  1st Mrg lesion is 75% stenosed.  Mid Cx lesion is 80% stenosed.  Prox RCA lesion is 80% stenosed.  Mid LAD lesion is 50% stenosed.  Dist LAD lesion is 75% stenosed.  RPDA-1 lesion is 75% stenosed.  RPDA-2 lesion is 50% stenosed.  RPAV lesion is 50% stenosed.  Prox LAD to Mid LAD lesion is 75% stenosed.  The left ventricular systolic function is normal.  LV end diastolic pressure is normal.  The left ventricular ejection fraction is 55-65% by visual estimate.  There is no aortic valve stenosis.  Diffuse three vessel disease, particularly throughout the LAD.  Plan for cardiac surgery consult.  Distal small vessel disease may affect quality of targets.  I stressed the importance of risk factor modification with the patient including lipid lowering therapy and BP control. Results discussed with his wife, 08/30/2020.   ECHOCARDIOGRAM COMPLETE  Result Date: 07/02/2020    ECHOCARDIOGRAM REPORT   Patient Name:   Leonard Donovan Date of Exam: 07/02/2020 Medical Rec #:  08/30/2020      Height:       69.0 in Accession #:    [id number]     Weight:       218.1 lb Date of Birth:  Sep 01, 1965     BSA:          2.143 m Patient Age:  54 years       BP:           189/108 mmHg Patient Gender: M              HR:           75 bpm. Exam Location:  Inpatient Procedure: 2D Echo, Cardiac Doppler and Color Doppler Indications:    NSTEMI I21.4  History:        Patient has no prior history of Echocardiogram examinations.                 Risk Factors:Hypertension.  Sonographer:    Eulah PontSarah Pirrotta RDCS Referring Phys: [id number] CALLIE E GOODRICH IMPRESSIONS  1. Left ventricular ejection fraction, by estimation, is 60 to 65%. The left ventricle has normal function. The left ventricle has no regional wall motion  abnormalities. Left ventricular diastolic parameters are consistent with Grade II diastolic dysfunction (pseudonormalization). Elevated left atrial pressure.  2. Right ventricular systolic function is normal. The right ventricular size is normal.  3. The mitral valve is grossly normal. Mild mitral valve regurgitation.  4. The aortic valve is normal in structure. Aortic valve regurgitation is not visualized. FINDINGS  Left Ventricle: Left ventricular ejection fraction, by estimation, is 60 to 65%. The left ventricle has normal function. The left ventricle has no regional wall motion abnormalities. The left ventricular internal cavity size was normal in size. There is  no left ventricular hypertrophy. Left ventricular diastolic parameters are consistent with Grade II diastolic dysfunction (pseudonormalization). Elevated left atrial pressure. Right Ventricle: The right ventricular size is normal. Right vetricular wall thickness was not assessed. Right ventricular systolic function is normal. Left Atrium: Left atrial size was normal in size. Right Atrium: Right atrial size was normal in size. Pericardium: There is no evidence of pericardial effusion. Mitral Valve: The mitral valve is grossly normal. Mild mitral valve regurgitation. Tricuspid Valve: The tricuspid valve is normal in structure. Tricuspid valve regurgitation is mild. Aortic Valve: The aortic valve is normal in structure. Aortic valve regurgitation is not visualized. Pulmonic Valve: The pulmonic valve was normal in structure. Pulmonic valve regurgitation is not visualized. Aorta: The aortic root and ascending aorta are structurally normal, with no evidence of dilitation. IAS/Shunts: The interatrial septum was not assessed.  LEFT VENTRICLE PLAX 2D LVIDd:         4.70 cm  Diastology LVIDs:         3.10 cm  LV e' medial:    4.35 cm/s LV PW:         1.00 cm  LV E/e' medial:  20.2 LV IVS:        1.10 cm  LV e' lateral:   3.26 cm/s LVOT diam:     2.10 cm  LV E/e'  lateral: 26.9 LV SV:         85 LV SV Index:   40 LVOT Area:     3.46 cm  RIGHT VENTRICLE RV S prime:     18.50 cm/s TAPSE (M-mode): 2.1 cm LEFT ATRIUM             Index       RIGHT ATRIUM           Index LA diam:        3.50 cm 1.63 cm/m  RA Area:     10.20 cm LA Vol (A2C):   52.7 ml 24.59 ml/m RA Volume:   19.70 ml  9.19 ml/m LA Vol (A4C):   51.1  ml 23.84 ml/m LA Biplane Vol: 54.6 ml 25.47 ml/m  AORTIC VALVE LVOT Vmax:   123.00 cm/s LVOT Vmean:  89.800 cm/s LVOT VTI:    0.245 m  AORTA Ao Root diam: 3.20 cm Ao Asc diam:  3.20 cm MITRAL VALVE MV Area (PHT): 2.66 cm     SHUNTS MV Decel Time: 285 msec     Systemic VTI:  0.24 m MV E velocity: 87.80 cm/s   Systemic Diam: 2.10 cm MV A velocity: 119.00 cm/s MV E/A ratio:  0.74 Dietrich Pates MD Electronically signed by Dietrich Pates MD Signature Date/Time: 07/02/2020/8:02:07 PM    Final      I have independently reviewed the above radiologic studies and discussed with the patient   Recent Lab Findings: Lab Results  Component Value Date   WBC 8.1 07/03/2020   HGB 13.9 07/03/2020   HCT 41.4 07/03/2020   PLT 198 07/03/2020   GLUCOSE 109 (H) 07/03/2020   CHOL 149 07/03/2020   TRIG 82 07/03/2020   HDL 34 (L) 07/03/2020   LDLCALC 99 07/03/2020   ALT 17 07/01/2020   AST 23 07/01/2020   NA 139 07/03/2020   K 3.6 07/03/2020   CL 107 07/03/2020   CREATININE 1.05 07/03/2020   BUN 10 07/03/2020   CO2 22 07/03/2020   HGBA1C 5.7 (H) 07/02/2020      Assessment / Plan:      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 Pain free at the moment. He is on asa, statin, Norvasc and Coreg.  2. Hypertension-has not been on medication in many years due to intolerance. BP moderately controlled on IV nitro and PO medications listed above. 3. Glaucoma- mild, was never on any medications 4. Recent COVID + a month ago. Lingering cough but no other symptoms at this time. On room air with good oxygen saturation. Recent CXR reviewed without pleural effusion. Upper  bilateral hazy opacities mentioned in the CXR report but with no WBC or fevers doubtful this is pneumonia. No CXR pre-COVID to compare to.   Plan: Coronary artery bypass grafting discussed with the patient and wife at the bedside. Possibly scheduled with Dr. Tyrone Sage for Tuesday 2/8. All questions were answered to the patient's and wife's satisfaction. Would recommend PFTs for recent COVID infection.     I  spent 40 minutes counseling the patient face to face.  Jari Favre, PA-C 07/03/2020 9:12 AM  Patient seen, examined, laboratory findings chest x-ray and cine films all reviewed.  Patient presented to the current facility on 68 approximately 530 Wednesday, February 3 after trying to get an appointment because of chest discomfort shoulder pain starting on Tuesday.  He presented with markedly elevated blood pressure 250/128, tachycardia positive cardiac enzymes.  No CTA of the chest was done to rule out dissection.  He was started on heparin and nitroglycerin drips.  Ultimately after delay of 18 hours he was seen by cardiology at [facility] and transferred.  Echocardiogram and cardiac catheterization was done, confirming presence of three-vessel coronary artery disease.  The patient's blood pressure is better controlled now and he is without chest pain.  I have reviewed in detail with he and his wife the findings on the cardiac catheterization and the place for coronary artery bypass grafting with significant three-vessel coronary artery disease symptomatic and non-STEMI presentation.  He does have distal disease both in the circumflex and right coronary distribution but also has significant proximal disease which could not be adequately bypassed.  Risk of surgery  including death infection stroke myocardial infarction bleeding blood transfusion were all discussed.  Currently operative schedule as the patient on for coronary artery bypass grafting on Wednesday morning.  With his acute presentation poorly controlled  blood pressure remaining in the hospital until the time of surgery should be done. I spend 45 min with patient and his wife explaining details.  Delight Ovens MD      [address] [place] [place] [address] Office [phone]

## 2020-07-03 NOTE — Progress Notes (Signed)
Progress Note  Patient Name: Leonard Donovan Date of Encounter: 07/03/2020  [facility] Cardiologist: New patient  Subjective   The patient feels better today he denies any chest pain or shortness of breath.  Inpatient Medications    Scheduled Meds: . amLODipine  5 mg Oral Daily  . aspirin EC  81 mg Oral Daily  . atorvastatin  80 mg Oral Daily  . carvedilol  3.125 mg Oral BID WC  . sodium chloride flush  3 mL Intravenous Q12H   Continuous Infusions: . sodium chloride    . heparin 1,700 Units/hr (07/03/20 0547)  . nitroGLYCERIN 30 mcg/min (07/03/20 0110)   PRN Meds: sodium chloride, acetaminophen, nitroGLYCERIN, ondansetron (ZOFRAN) IV, sodium chloride flush   Vital Signs    Vitals:   07/02/20 1900 07/03/20 0104 07/03/20 0313 07/03/20 0725  BP: 117/76 136/78 (!) 156/93 (!) 148/88  Pulse: 87 74 73 73  Resp: (!) 21 (!) 23 17 18   Temp: 98.6 F (37 C) 98.1 F (36.7 C) (!) 97.5 F (36.4 C) 98.8 F (37.1 C)  TempSrc: Oral  Oral Oral  SpO2: 92% 94% 93% 94%  Weight:   96.7 kg   Height:        Intake/Output Summary (Last 24 hours) at 07/03/2020 1122 Last data filed at 07/03/2020 0850 Gross per 24 hour  Intake 809 ml  Output 1775 ml  Net -966 ml   Last 3 Weights 07/03/2020 07/02/2020 07/02/2020  Weight (lbs) 213 lb 3.2 oz 218 lb 1.6 oz 218 lb 1.6 oz  Weight (kg) 96.707 kg 98.93 kg 98.93 kg      Telemetry    Sinus rhythm with ventricular rates in 70s- Personally Reviewed  ECG    No new tracing- Personally Reviewed  Physical Exam   GEN: No acute distress.   Neck: No JVD Cardiac: RRR, no murmurs, rubs, or gallops.  Respiratory: Clear to auscultation bilaterally. GI: Soft, nontender, non-distended  MS: No edema; No deformity. Neuro:  Nonfocal  Psych: Normal affect   Labs    High Sensitivity Troponin:   Recent Labs  Lab 07/01/20 1739 07/01/20 1913 07/02/20 0955  TROPONINIHS 558* 601* 271*      Chemistry Recent Labs  Lab 07/01/20 1739 07/03/20 0401   NA 136 139  K 4.1 3.6  CL 101 107  CO2 24 22  GLUCOSE 101* 109*  BUN 20 10  CREATININE 1.09 1.05  CALCIUM 9.5 8.7*  PROT 7.3  --   ALBUMIN 4.2  --   AST 23  --   ALT 17  --   ALKPHOS 98  --   BILITOT 0.6  --   GFRNONAA >60 >60  ANIONGAP 11 10     Hematology Recent Labs  Lab 07/01/20 1739 07/02/20 1453 07/03/20 0401  WBC 6.9 8.9 8.1  RBC 5.47 5.12 4.85  HGB 15.7 14.7 13.9  HCT 46.8 45.1 41.4  MCV 85.6 88.1 85.4  MCH 28.7 28.7 28.7  MCHC 33.5 32.6 33.6  RDW 13.2 13.3 13.3  PLT 291 240 198    BNPNo results for input(s): BNP, PROBNP in the last 168 hours.   DDimer  Recent Labs  Lab 07/01/20 1739  DDIMER 0.62*     Radiology    DG Chest 2 View  Result Date: 07/01/2020 CLINICAL DATA:  Chest pain.  Shortness of breath EXAM: CHEST - 2 VIEW COMPARISON:  None. FINDINGS: Heart size is normal. No pleural effusion or edema. Bilateral upper lobe predominant hazy opacities are  identified. Subsegmental atelectasis noted in the left base. The visualized osseous structures are unremarkable. IMPRESSION: Bilateral upper lobe predominant hazy opacities concerning for multifocal infection. Electronically Signed   By: Signa Kell M.D.   On: 07/01/2020 18:01   CARDIAC CATHETERIZATION  Result Date: 07/02/2020  1st Mrg lesion is 75% stenosed.  Mid Cx lesion is 80% stenosed.  Prox RCA lesion is 80% stenosed.  Mid LAD lesion is 50% stenosed.  Dist LAD lesion is 75% stenosed.  RPDA-1 lesion is 75% stenosed.  RPDA-2 lesion is 50% stenosed.  RPAV lesion is 50% stenosed.  Prox LAD to Mid LAD lesion is 75% stenosed.  The left ventricular systolic function is normal.  LV end diastolic pressure is normal.  The left ventricular ejection fraction is 55-65% by visual estimate.  There is no aortic valve stenosis.  Diffuse three vessel disease, particularly throughout the LAD.  Plan for cardiac surgery consult.  Distal small vessel disease may affect quality of targets.  I stressed the  importance of risk factor modification with the patient including lipid lowering therapy and BP control. Results discussed with his wife, Albin Felling.   ECHOCARDIOGRAM COMPLETE: 07/01/2020  1. Left ventricular ejection fraction, by estimation, is 60 to 65%. The  left ventricle has normal function. The left ventricle has no regional  wall motion abnormalities. Left ventricular diastolic parameters are  consistent with Grade II diastolic  dysfunction (pseudonormalization). Elevated left atrial pressure.  2. Right ventricular systolic function is normal. The right ventricular  size is normal.  3. The mitral valve is grossly normal. Mild mitral valve regurgitation.  4. The aortic valve is normal in structure. Aortic valve regurgitation is  not visualized.   Patient Profile     55 y.o. male   Assessment & Plan    CAD, non-STEMI -the patient underwent cardiac catheterization yesterday that showed three-vessel coronary artery disease and was referred to CT surgery that has scheduled him for bypass surgery for next Tuesday, July 07, 2020. He is undergoing preop evaluation including vascular mapping and carotid ultrasound. His LVEF is 60 to 65% with no regional wall motion abnormalities he has grade 2 diastolic dysfunction but no signs of fluid overload on physical exam. New aspirin and high-dose statin with atorvastatin 80 mg daily.  Hypertension -his blood pressure remains elevated, I would increase his carvedilol to 6.25 mg p.o. twice daily, we will continue amlodipine 5 mg daily.  Uptitrate as needed.  Hyperlipidemia -as of above.  For questions or updates, please contact [facility] Please consult [email or web address] for contact info under     Signed, Tobias Alexander, MD  07/03/2020, 11:22 AM

## 2020-07-03 NOTE — Progress Notes (Signed)
ANTICOAGULATION CONSULT NOTE   Pharmacy Consult for heparin Indication: ACS/STEMI  No Known Allergies  Patient Measurements: Height: 5\' 9"  (175.3 cm) Weight: 96.7 kg (213 lb 3.2 oz) (scale b) IBW/kg (Calculated) : 70.7 Heparin Dosing Weight: 91 kg  Vital Signs: Temp: 98.5 F (36.9 C) (02/04 1134) Temp Source: Oral (02/04 1134) BP: 149/93 (02/04 1134) Pulse Rate: 73 (02/04 0725)  Labs: Recent Labs    07/01/20 1739 07/01/20 1913 07/02/20 0115 07/02/20 0955 07/02/20 1453 07/03/20 0401 07/03/20 1202  HGB 15.7  --   --   --  14.7 13.9  --   HCT 46.8  --   --   --  45.1 41.4  --   PLT 291  --   --   --  240 198  --   HEPARINUNFRC  --   --    < > 0.29*  --  <0.10* 0.13*  CREATININE 1.09  --   --   --   --  1.05  --   TROPONINIHS 558* 601*  --  271*  --   --   --    < > = values in this interval not displayed.    Estimated Creatinine Clearance: 94.4 mL/min (by C-G formula based on SCr of 1.05 mg/dL).  Assessment: 55 yo male s/p cath with multi-vessels dz.. Heparin resumed post sheath removal.   Cabg planned for Tuesday  Heparin level still low post cath  Goal of Therapy:  Heparin level 0.3-0.7 units/ml Monitor platelets by anticoagulation protocol: Yes   Plan:  Increase heparin to 2000 units / hr 8 hour heparin level  Thank you Saturday, PharmD 07/03/2020 1:07 PM

## 2020-07-03 NOTE — Progress Notes (Signed)
CARDIAC REHAB PHASE I   Talked with pt and wife about sternal precautions, IS, mobility post op, and d/c planning. Pt was receptive and performed 2500+ on IS. Gave materials for pt to review. Wife and daughter will be with him d/c.   [id number] Harrie Jeans ACSM-EP 07/03/2020 2:43 PM

## 2020-07-03 NOTE — Progress Notes (Signed)
Pre-CABG  Completed    Please see CV Proc for preliminary results.   Clint Guy, RVT

## 2020-07-04 DIAGNOSIS — I214 Non-ST elevation (NSTEMI) myocardial infarction: Secondary | ICD-10-CM | POA: Diagnosis not present

## 2020-07-04 DIAGNOSIS — I161 Hypertensive emergency: Secondary | ICD-10-CM | POA: Diagnosis not present

## 2020-07-04 LAB — CBC
HCT: 40.6 % (ref 39.0–52.0)
Hemoglobin: 13.4 g/dL (ref 13.0–17.0)
MCH: 28.3 pg (ref 26.0–34.0)
MCHC: 33 g/dL (ref 30.0–36.0)
MCV: 85.8 fL (ref 80.0–100.0)
Platelets: 185 10*3/uL (ref 150–400)
RBC: 4.73 MIL/uL (ref 4.22–5.81)
RDW: 13.2 % (ref 11.5–15.5)
WBC: 7.7 10*3/uL (ref 4.0–10.5)
nRBC: 0 % (ref 0.0–0.2)

## 2020-07-04 LAB — BASIC METABOLIC PANEL
Anion gap: 9 (ref 5–15)
BUN: 15 mg/dL (ref 6–20)
CO2: 22 mmol/L (ref 22–32)
Calcium: 8.3 mg/dL — ABNORMAL LOW (ref 8.9–10.3)
Chloride: 107 mmol/L (ref 98–111)
Creatinine, Ser: 1.08 mg/dL (ref 0.61–1.24)
GFR, Estimated: >60 mL/min (ref >60)
Glucose, Bld: 96 mg/dL (ref 70–99)
Potassium: 3.5 mmol/L (ref 3.5–5.1)
Sodium: 138 mmol/L (ref 135–145)

## 2020-07-04 LAB — HEPARIN LEVEL (UNFRACTIONATED): Heparin Unfractionated: 0.48 IU/mL (ref 0.30–0.70)

## 2020-07-04 MED ORDER — CARVEDILOL 12.5 MG PO TABS
12.5000 mg | ORAL_TABLET | Freq: Two times a day (BID) | ORAL | Status: DC
  Administered 2020-07-04 – 2020-07-07 (×7): 12.5 mg via ORAL
  Filled 2020-07-04 (×4): qty 1
  Filled 2020-07-04: qty 2
  Filled 2020-07-04 (×2): qty 1

## 2020-07-04 NOTE — Progress Notes (Signed)
ANTICOAGULATION CONSULT NOTE   Pharmacy Consult for heparin Indication: ACS/STEMI  No Known Allergies  Patient Measurements: Height: 5\' 9"  (175.3 cm) Weight: 96.6 kg (213 lb) IBW/kg (Calculated) : 70.7 Heparin Dosing Weight: 91 kg  Vital Signs: Temp: 98.5 F (36.9 C) (02/05 0725) Temp Source: Oral (02/05 0725) BP: 144/101 (02/05 0725) Pulse Rate: 74 (02/05 0725)  Labs: Recent Labs    07/01/20 1739 07/01/20 1913 07/02/20 0115 07/02/20 0955 07/02/20 1453 07/03/20 0401 07/03/20 1202 07/03/20 2150 07/04/20 0245  HGB 15.7  --   --   --  14.7 13.9  --   --  13.4  HCT 46.8  --   --   --  45.1 41.4  --   --  40.6  PLT 291  --   --   --  240 198  --   --  185  HEPARINUNFRC  --   --    < > 0.29*  --  <0.10* 0.13* 0.44 0.48  CREATININE 1.09  --   --   --   --  1.05  --   --  1.08  TROPONINIHS 558* 601*  --  271*  --   --   --   --   --    < > = values in this interval not displayed.    Estimated Creatinine Clearance: 91.8 mL/min (by C-G formula based on SCr of 1.08 mg/dL).  Assessment: 55 yo male s/p cath with multi-vessel dz. Heparin resumed post sheath removal. CABG planned for Tuesday 2/8.  Heparin level therapeutic at 0.48 on gtt at 2000 units/hr. CBC stable, no bleeding noted.  Goal of Therapy:  Heparin level 0.3-0.7 units/ml Monitor platelets by anticoagulation protocol: Yes   Plan:  Continue heparin at 2000 units / hr F/u daily heparin level and CBC, monitor for s/sx bleeding Follow plans post-CABG  06-19-2005, PharmD PGY1 Acute Care Pharmacy Resident Please refer to [facility] for unit-specific pharmacist

## 2020-07-04 NOTE — Progress Notes (Signed)
Progress Note  Patient Name: Leonard Donovan Date of Encounter: 07/04/2020  [facility] Cardiologist: New patient  Subjective   No events overnight, he is chest pain-free.  Inpatient Medications    Scheduled Meds: . amLODipine  5 mg Oral Daily  . aspirin EC  81 mg Oral Daily  . atorvastatin  80 mg Oral Daily  . carvedilol  3.125 mg Oral BID WC  . sodium chloride flush  3 mL Intravenous Q12H   Continuous Infusions: . sodium chloride    . heparin 2,000 Units/hr (07/04/20 0008)  . nitroGLYCERIN 30 mcg/min (07/04/20 0009)   PRN Meds: sodium chloride, acetaminophen, nitroGLYCERIN, ondansetron (ZOFRAN) IV, sodium chloride flush   Vital Signs    Vitals:   07/03/20 1941 07/04/20 0015 07/04/20 0307 07/04/20 0725  BP: (!) 159/87 (!) 147/103 (!) 142/88 (!) 144/101  Pulse: 88 72 77 74  Resp: (!) 21  20 20   Temp: 99 F (37.2 C) 98 F (36.7 C) 98.8 F (37.1 C) 98.5 F (36.9 C)  TempSrc: Oral Oral Oral Oral  SpO2: 95% 96% 95% 95%  Weight:   96.6 kg   Height:        Intake/Output Summary (Last 24 hours) at 07/04/2020 1050 Last data filed at 07/04/2020 0834 Gross per 24 hour  Intake 1077 ml  Output 400 ml  Net 677 ml   Last 3 Weights 07/04/2020 07/03/2020 07/02/2020  Weight (lbs) 213 lb 213 lb 3.2 oz 218 lb 1.6 oz  Weight (kg) 96.616 kg 96.707 kg 98.93 kg      Telemetry    Sinus rhythm with ventricular rates in 70s- Personally Reviewed  ECG    No new tracing- Personally Reviewed  Physical Exam   GEN: No acute distress.   Neck: No JVD Cardiac: RRR, no murmurs, rubs, or gallops.  Respiratory: Clear to auscultation bilaterally. GI: Soft, nontender, non-distended  MS: No edema; No deformity. Neuro:  Nonfocal  Psych: Normal affect   Labs    High Sensitivity Troponin:   Recent Labs  Lab 07/01/20 1739 07/01/20 1913 07/02/20 0955  TROPONINIHS 558* 601* 271*      Chemistry Recent Labs  Lab 07/01/20 1739 07/03/20 0401 07/04/20 0245  NA 136 139 138  K 4.1  3.6 3.5  CL 101 107 107  CO2 24 22 22   GLUCOSE 101* 109* 96  BUN 20 10 15   CREATININE 1.09 1.05 1.08  CALCIUM 9.5 8.7* 8.3*  PROT 7.3  --   --   ALBUMIN 4.2  --   --   AST 23  --   --   ALT 17  --   --   ALKPHOS 98  --   --   BILITOT 0.6  --   --   GFRNONAA >60 >60 >60  ANIONGAP 11 10 9      Hematology Recent Labs  Lab 07/02/20 1453 07/03/20 0401 07/04/20 0245  WBC 8.9 8.1 7.7  RBC 5.12 4.85 4.73  HGB 14.7 13.9 13.4  HCT 45.1 41.4 40.6  MCV 88.1 85.4 85.8  MCH 28.7 28.7 28.3  MCHC 32.6 33.6 33.0  RDW 13.3 13.3 13.2  PLT 240 198 185    BNPNo results for input(s): BNP, PROBNP in the last 168 hours.   DDimer  Recent Labs  Lab 07/01/20 1739  DDIMER 0.62*     Radiology    DG Chest 2 View  Result Date: 07/01/2020 CLINICAL DATA:  Chest pain.  Shortness of breath EXAM: CHEST - 2  VIEW COMPARISON:  None. FINDINGS: Heart size is normal. No pleural effusion or edema. Bilateral upper lobe predominant hazy opacities are identified. Subsegmental atelectasis noted in the left base. The visualized osseous structures are unremarkable. IMPRESSION: Bilateral upper lobe predominant hazy opacities concerning for multifocal infection. Electronically Signed   By: Signa Kell M.D.   On: 07/01/2020 18:01   CARDIAC CATHETERIZATION  Result Date: 07/02/2020  1st Mrg lesion is 75% stenosed.  Mid Cx lesion is 80% stenosed.  Prox RCA lesion is 80% stenosed.  Mid LAD lesion is 50% stenosed.  Dist LAD lesion is 75% stenosed.  RPDA-1 lesion is 75% stenosed.  RPDA-2 lesion is 50% stenosed.  RPAV lesion is 50% stenosed.  Prox LAD to Mid LAD lesion is 75% stenosed.  The left ventricular systolic function is normal.  LV end diastolic pressure is normal.  The left ventricular ejection fraction is 55-65% by visual estimate.  There is no aortic valve stenosis.  Diffuse three vessel disease, particularly throughout the LAD.  Plan for cardiac surgery consult.  Distal small vessel disease may  affect quality of targets.  I stressed the importance of risk factor modification with the patient including lipid lowering therapy and BP control. Results discussed with his wife, Leonard Donovan.   ECHOCARDIOGRAM COMPLETE: 07/01/2020  1. Left ventricular ejection fraction, by estimation, is 60 to 65%. The  left ventricle has normal function. The left ventricle has no regional  wall motion abnormalities. Left ventricular diastolic parameters are  consistent with Grade II diastolic  dysfunction (pseudonormalization). Elevated left atrial pressure.  2. Right ventricular systolic function is normal. The right ventricular  size is normal.  3. The mitral valve is grossly normal. Mild mitral valve regurgitation.  4. The aortic valve is normal in structure. Aortic valve regurgitation is  not visualized.   Patient Profile     55 y.o. male   Assessment & Plan    CAD, non-STEMI -the patient underwent cardiac catheterization yesterday that showed three-vessel coronary artery disease and was referred to CT surgery that has scheduled him for bypass surgery for next Tuesday, July 07, 2020. He is undergoing preop evaluation including vascular mapping and carotid ultrasound. His LVEF is 60 to 65% with no regional wall motion abnormalities he has grade 2 diastolic dysfunction but no signs of fluid overload on physical exam. -New aspirin and high-dose statin with atorvastatin 80 mg daily. -Discontinue nitroglycerin drip,  -Uptitrate carvedilol to 12.5 mg p.o. twice daily  Hypertension -his blood pressure remains elevated, I would increase his carvedilol to 6.25 mg p.o. twice daily, we will continue amlodipine 5 mg daily. Uptitrate carvedilol to 12.5 mg p.o. twice daily.  Hyperlipidemia -atorvastatin 80 mg daily.  For questions or updates, please contact [facility] Please consult [email or web address] for contact info under     Signed, Tobias Alexander, MD  07/04/2020, 10:50 AM

## 2020-07-04 NOTE — Progress Notes (Signed)
CARDIAC REHAB PHASE I   PRE:  Rate/Rhythm: SR/78  BP:  Sitting: 150/93      SaO2: 96  MODE:  Ambulation: 470 ft 96%/SR  POST:  Rate/Rhythm: SR/97  BP:  Sitting: 161/103      SaO2: 97%  Pr OOB in chair, agrees to walk. Pt voices he has eager to walk all day. Pt ambulated with strong, steady gait and had no C/O SOB, dizziness or pain. Pt back to chair with spouse in room. Call bell at side. Post ambulation BP elevated 161/103: RN Laveda Norman) notified. OOB to chair encouraged.   Lorin Picket, MS, ACSM EP-C, [facility] 07/04/2020  2:43 PM

## 2020-07-05 DIAGNOSIS — I214 Non-ST elevation (NSTEMI) myocardial infarction: Secondary | ICD-10-CM | POA: Diagnosis not present

## 2020-07-05 DIAGNOSIS — I161 Hypertensive emergency: Secondary | ICD-10-CM | POA: Diagnosis not present

## 2020-07-05 DIAGNOSIS — E782 Mixed hyperlipidemia: Secondary | ICD-10-CM | POA: Diagnosis not present

## 2020-07-05 LAB — BASIC METABOLIC PANEL
Anion gap: 9 (ref 5–15)
BUN: 11 mg/dL (ref 6–20)
CO2: 21 mmol/L — ABNORMAL LOW (ref 22–32)
Calcium: 8.7 mg/dL — ABNORMAL LOW (ref 8.9–10.3)
Chloride: 108 mmol/L (ref 98–111)
Creatinine, Ser: 1 mg/dL (ref 0.61–1.24)
GFR, Estimated: >60 mL/min (ref >60)
Glucose, Bld: 94 mg/dL (ref 70–99)
Potassium: 3.9 mmol/L (ref 3.5–5.1)
Sodium: 138 mmol/L (ref 135–145)

## 2020-07-05 LAB — CBC
HCT: 42.3 % (ref 39.0–52.0)
Hemoglobin: 14 g/dL (ref 13.0–17.0)
MCH: 28.7 pg (ref 26.0–34.0)
MCHC: 33.1 g/dL (ref 30.0–36.0)
MCV: 86.7 fL (ref 80.0–100.0)
Platelets: 165 10*3/uL (ref 150–400)
RBC: 4.88 MIL/uL (ref 4.22–5.81)
RDW: 13.5 % (ref 11.5–15.5)
WBC: 7 10*3/uL (ref 4.0–10.5)
nRBC: 0 % (ref 0.0–0.2)

## 2020-07-05 LAB — HEPARIN LEVEL (UNFRACTIONATED): Heparin Unfractionated: 0.64 IU/mL (ref 0.30–0.70)

## 2020-07-05 MED ORDER — LOSARTAN POTASSIUM 50 MG PO TABS
50.0000 mg | ORAL_TABLET | Freq: Every day | ORAL | Status: DC
  Administered 2020-07-05 – 2020-07-07 (×3): 50 mg via ORAL
  Filled 2020-07-05 (×3): qty 1

## 2020-07-05 NOTE — Progress Notes (Signed)
ANTICOAGULATION CONSULT NOTE   Pharmacy Consult for heparin Indication: ACS/STEMI  No Known Allergies  Patient Measurements: Height: 5\' 9"  (175.3 cm) Weight: 96.3 kg (212 lb 6.4 oz) IBW/kg (Calculated) : 70.7 Heparin Dosing Weight: 91 kg  Vital Signs: Temp: 97.8 F (36.6 C) (02/06 0800) Temp Source: Oral (02/06 0800) BP: 159/99 (02/06 0800) Pulse Rate: 65 (02/06 0800)  Labs: Recent Labs    07/02/20 0955 07/02/20 1453 07/03/20 0401 07/03/20 1202 07/03/20 2150 07/04/20 0245 07/05/20 0445  HGB  --    < > 13.9  --   --  13.4 14.0  HCT  --    < > 41.4  --   --  40.6 42.3  PLT  --    < > 198  --   --  185 165  HEPARINUNFRC 0.29*  --  <0.10*   < > 0.44 0.48 0.64  CREATININE  --   --  1.05  --   --  1.08 1.00  TROPONINIHS 271*  --   --   --   --   --   --    < > = values in this interval not displayed.    Estimated Creatinine Clearance: 98.9 mL/min (by C-G formula based on SCr of 1 mg/dL).  Assessment: 55 yo male s/p cath with multi-vessel dz. Heparin resumed post sheath removal. CABG planned for Tuesday 2/8.  Heparin level therapeutic at 0.64 on gtt at 2,000 units/hr. CBC stable, no bleeding noted.  Goal of Therapy:  Heparin level 0.3-0.7 units/ml Monitor platelets by anticoagulation protocol: Yes   Plan:  Continue IV heparin at 2,000 units/hr F/u daily heparin level and CBC, monitor for s/sx bleeding Follow plans post-CABG  06-19-2005, PharmD PGY1 Acute Care Pharmacy Resident Please refer to [facility] for unit-specific pharmacist

## 2020-07-05 NOTE — Progress Notes (Addendum)
Progress Note  Patient Name: Leonard Donovan Date of Encounter: 07/05/2020  [facility] Cardiologist: New patient  Subjective   No events overnight, no recurrent chest pain.  Inpatient Medications    Scheduled Meds: . amLODipine  5 mg Oral Daily  . aspirin EC  81 mg Oral Daily  . atorvastatin  80 mg Oral Daily  . carvedilol  12.5 mg Oral BID WC  . losartan  50 mg Oral Daily  . sodium chloride flush  3 mL Intravenous Q12H   Continuous Infusions: . sodium chloride    . heparin 2,000 Units/hr (07/05/20 1240)   PRN Meds: sodium chloride, acetaminophen, nitroGLYCERIN, ondansetron (ZOFRAN) IV, sodium chloride flush   Vital Signs    Vitals:   07/05/20 0532 07/05/20 0536 07/05/20 0800 07/05/20 1129  BP: (!) 169/99  (!) 159/99 (!) 158/100  Pulse: 67  65 61  Resp: 17  18 17   Temp: 98.4 F (36.9 C)  97.8 F (36.6 C) 98.2 F (36.8 C)  TempSrc: Oral  Oral Oral  SpO2: 98%  95% 94%  Weight:  96.3 kg    Height:        Intake/Output Summary (Last 24 hours) at 07/05/2020 1312 Last data filed at 07/05/2020 0955 Gross per 24 hour  Intake 723 ml  Output 0 ml  Net 723 ml   Last 3 Weights 07/05/2020 07/04/2020 07/03/2020  Weight (lbs) 212 lb 6.4 oz 213 lb 213 lb 3.2 oz  Weight (kg) 96.344 kg 96.616 kg 96.707 kg     Telemetry    Sinus rhythm with ventricular rates in 70s- Personally Reviewed  ECG    No new tracing- Personally Reviewed  Physical Exam   GEN: No acute distress.   Neck: No JVD Cardiac: RRR, no murmurs, rubs, or gallops.  Respiratory: Clear to auscultation bilaterally. GI: Soft, nontender, non-distended  MS: No edema; No deformity. Neuro:  Nonfocal  Psych: Normal affect   Labs    High Sensitivity Troponin:   Recent Labs  Lab 07/01/20 1739 07/01/20 1913 07/02/20 0955  TROPONINIHS 558* 601* 271*     Chemistry Recent Labs  Lab 07/01/20 1739 07/03/20 0401 07/04/20 0245 07/05/20 0445  NA 136 139 138 138  K 4.1 3.6 3.5 3.9  CL 101 107 107 108   CO2 24 22 22  21*  GLUCOSE 101* 109* 96 94  BUN 20 10 15 11   CREATININE 1.09 1.05 1.08 1.00  CALCIUM 9.5 8.7* 8.3* 8.7*  PROT 7.3  --   --   --   ALBUMIN 4.2  --   --   --   AST 23  --   --   --   ALT 17  --   --   --   ALKPHOS 98  --   --   --   BILITOT 0.6  --   --   --   GFRNONAA >60 >60 >60 >60  ANIONGAP 11 10 9 9     Hematology Recent Labs  Lab 07/03/20 0401 07/04/20 0245 07/05/20 0445  WBC 8.1 7.7 7.0  RBC 4.85 4.73 4.88  HGB 13.9 13.4 14.0  HCT 41.4 40.6 42.3  MCV 85.4 85.8 86.7  MCH 28.7 28.3 28.7  MCHC 33.6 33.0 33.1  RDW 13.3 13.2 13.5  PLT 198 185 165   BNPNo results for input(s): BNP, PROBNP in the last 168 hours.   DDimer  Recent Labs  Lab 07/01/20 1739  DDIMER 0.62*    Radiology  DG Chest 2 View  Result Date: 07/01/2020 CLINICAL DATA:  Chest pain.  Shortness of breath EXAM: CHEST - 2 VIEW COMPARISON:  None. FINDINGS: Heart size is normal. No pleural effusion or edema. Bilateral upper lobe predominant hazy opacities are identified. Subsegmental atelectasis noted in the left base. The visualized osseous structures are unremarkable. IMPRESSION: Bilateral upper lobe predominant hazy opacities concerning for multifocal infection. Electronically Signed   By: Signa Kell M.D.   On: 07/01/2020 18:01   CARDIAC CATHETERIZATION  Result Date: 07/02/2020  1st Mrg lesion is 75% stenosed.  Mid Cx lesion is 80% stenosed.  Prox RCA lesion is 80% stenosed.  Mid LAD lesion is 50% stenosed.  Dist LAD lesion is 75% stenosed.  RPDA-1 lesion is 75% stenosed.  RPDA-2 lesion is 50% stenosed.  RPAV lesion is 50% stenosed.  Prox LAD to Mid LAD lesion is 75% stenosed.  The left ventricular systolic function is normal.  LV end diastolic pressure is normal.  The left ventricular ejection fraction is 55-65% by visual estimate.  There is no aortic valve stenosis.  Diffuse three vessel disease, particularly throughout the LAD.  Plan for cardiac surgery consult.  Distal  small vessel disease may affect quality of targets.  I stressed the importance of risk factor modification with the patient including lipid lowering therapy and BP control. Results discussed with his wife, Albin Felling.   ECHOCARDIOGRAM COMPLETE: 07/01/2020  1. Left ventricular ejection fraction, by estimation, is 60 to 65%. The  left ventricle has normal function. The left ventricle has no regional  wall motion abnormalities. Left ventricular diastolic parameters are  consistent with Grade II diastolic  dysfunction (pseudonormalization). Elevated left atrial pressure.  2. Right ventricular systolic function is normal. The right ventricular  size is normal.  3. The mitral valve is grossly normal. Mild mitral valve regurgitation.  4. The aortic valve is normal in structure. Aortic valve regurgitation is  not visualized.    Patient Profile     55 y.o. male   Assessment & Plan    CAD, non-STEMI -the patient underwent cardiac catheterization yesterday that showed three-vessel coronary artery disease and was referred to CT surgery that has scheduled him for bypass surgery for next Tuesday, July 07, 2020. He is undergoing preop evaluation including vascular mapping and carotid ultrasound. His LVEF is 60 to 65% with no regional wall motion abnormalities he has grade 2 diastolic dysfunction but no signs of fluid overload on physical exam. -New aspirin and high-dose statin with atorvastatin 80 mg daily. -Discontinued nitroglycerin drip,  -continue carvedilol to 12.5 mg p.o. twice daily  Hypertension -his blood pressure remains elevated, continue carvedilol to 12.5 mg p.o. twice daily, amlodipine 5 mg daily, add losartan 50 mg po daily.  Hyperlipidemia -atorvastatin 80 mg daily.  For questions or updates, please contact [facility] Please consult [email or web address] for contact info under     Signed, Tobias Alexander, MD  07/05/2020, 1:12 PM

## 2020-07-06 DIAGNOSIS — I214 Non-ST elevation (NSTEMI) myocardial infarction: Secondary | ICD-10-CM | POA: Diagnosis not present

## 2020-07-06 LAB — CBC
HCT: 43.1 % (ref 39.0–52.0)
Hemoglobin: 14 g/dL (ref 13.0–17.0)
MCH: 28.5 pg (ref 26.0–34.0)
MCHC: 32.5 g/dL (ref 30.0–36.0)
MCV: 87.8 fL (ref 80.0–100.0)
Platelets: 188 10*3/uL (ref 150–400)
RBC: 4.91 MIL/uL (ref 4.22–5.81)
RDW: 13.7 % (ref 11.5–15.5)
WBC: 6.6 10*3/uL (ref 4.0–10.5)
nRBC: 0 % (ref 0.0–0.2)

## 2020-07-06 LAB — BASIC METABOLIC PANEL
Anion gap: 9 (ref 5–15)
BUN: 15 mg/dL (ref 6–20)
CO2: 20 mmol/L — ABNORMAL LOW (ref 22–32)
Calcium: 8.5 mg/dL — ABNORMAL LOW (ref 8.9–10.3)
Chloride: 108 mmol/L (ref 98–111)
Creatinine, Ser: 1.06 mg/dL (ref 0.61–1.24)
GFR, Estimated: >60 mL/min (ref >60)
Glucose, Bld: 97 mg/dL (ref 70–99)
Potassium: 3.7 mmol/L (ref 3.5–5.1)
Sodium: 137 mmol/L (ref 135–145)

## 2020-07-06 LAB — HEPARIN LEVEL (UNFRACTIONATED): Heparin Unfractionated: 0.6 IU/mL (ref 0.30–0.70)

## 2020-07-06 MED ORDER — AMLODIPINE BESYLATE 5 MG PO TABS
5.0000 mg | ORAL_TABLET | Freq: Once | ORAL | Status: AC
  Administered 2020-07-06: 5 mg via ORAL
  Filled 2020-07-06: qty 1

## 2020-07-06 MED ORDER — AMLODIPINE BESYLATE 10 MG PO TABS
10.0000 mg | ORAL_TABLET | Freq: Every day | ORAL | Status: DC
  Administered 2020-07-07: 10 mg via ORAL
  Filled 2020-07-06: qty 1

## 2020-07-06 NOTE — Progress Notes (Addendum)
Pre operative carotid duplex US showed right ICA 40-59% stenosis. Cardiologist can follow as outpatient. He states he had diarrhea yesterday but has none yet this am. Patient denies chest pain or shortness of breath within the last 24 hours. Patient is scheduled for CABG on Wednesday 07/08/2020 with Dr. Tyrone Sage.

## 2020-07-06 NOTE — Progress Notes (Addendum)
Progress Note  Patient Name: Leonard Donovan Date of Encounter: 07/06/2020  [facility] Cardiologist: Tobias Alexander, MD   Subjective   Feeling okay today. No recurrent chest pain. He has occasional mild DOE but no SOB at rest. He denies dizziness or lightheadedness. He mentions occasional sensation of food getting stuck in his throat and wonders if he has esophageal strictures like his mother, though has never seen a gastroenterologist.   Inpatient Medications    Scheduled Meds: . amLODipine  5 mg Oral Daily  . aspirin EC  81 mg Oral Daily  . atorvastatin  80 mg Oral Daily  . carvedilol  12.5 mg Oral BID WC  . losartan  50 mg Oral Daily  . sodium chloride flush  3 mL Intravenous Q12H   Continuous Infusions: . sodium chloride    . heparin 2,000 Units/hr (07/05/20 1240)   PRN Meds: sodium chloride, acetaminophen, nitroGLYCERIN, ondansetron (ZOFRAN) IV, sodium chloride flush   Vital Signs    Vitals:   07/05/20 2145 07/06/20 0535 07/06/20 0858 07/06/20 1202  BP: 136/88 (!) 144/100 (!) 146/122 (!) 141/91  Pulse:  (!) 52 (!) 54 (!) 56  Resp: 13 19  20   Temp: 98.4 F (36.9 C) 98.3 F (36.8 C)  97.9 F (36.6 C)  TempSrc: Oral Oral  Oral  SpO2: 98% 98%  96%  Weight:  96 kg    Height:        Intake/Output Summary (Last 24 hours) at 07/06/2020 1259 Last data filed at 07/06/2020 0843 Gross per 24 hour  Intake 654 ml  Output 825 ml  Net -171 ml   Last 3 Weights 07/06/2020 07/05/2020 07/04/2020  Weight (lbs) 211 lb 9.6 oz 212 lb 6.4 oz 213 lb  Weight (kg) 95.981 kg 96.344 kg 96.616 kg      Telemetry    Sinus bradycardia with rates as low as 46 bpm, though predominately in the 50s. - Personally Reviewed  ECG    No new tracings - Personally Reviewed  Physical Exam   GEN: Sitting in bedside chair in no acute distress.   Neck: No JVD Cardiac: RRR, no murmurs, rubs, or gallops.  Respiratory: Clear to auscultation bilaterally. GI: Soft, obese, nontender, non-distended   MS: No edema; No deformity. Neuro:  Nonfocal  Psych: Normal affect   Labs    High Sensitivity Troponin:   Recent Labs  Lab 07/01/20 1739 07/01/20 1913 07/02/20 0955  TROPONINIHS 558* 601* 271*      Chemistry Recent Labs  Lab 07/01/20 1739 07/03/20 0401 07/04/20 0245 07/05/20 0445 07/06/20 0216  NA 136   < > 138 138 137  K 4.1   < > 3.5 3.9 3.7  CL 101   < > 107 108 108  CO2 24   < > 22 21* 20*  GLUCOSE 101*   < > 96 94 97  BUN 20   < > 15 11 15   CREATININE 1.09   < > 1.08 1.00 1.06  CALCIUM 9.5   < > 8.3* 8.7* 8.5*  PROT 7.3  --   --   --   --   ALBUMIN 4.2  --   --   --   --   AST 23  --   --   --   --   ALT 17  --   --   --   --   ALKPHOS 98  --   --   --   --   BILITOT  0.6  --   --   --   --   GFRNONAA >60   < > >60 >60 >60  ANIONGAP 11   < > 9 9 9    < > = values in this interval not displayed.     Hematology Recent Labs  Lab 07/04/20 0245 07/05/20 0445 07/06/20 0216  WBC 7.7 7.0 6.6  RBC 4.73 4.88 4.91  HGB 13.4 14.0 14.0  HCT 40.6 42.3 43.1  MCV 85.8 86.7 87.8  MCH 28.3 28.7 28.5  MCHC 33.0 33.1 32.5  RDW 13.2 13.5 13.7  PLT 185 165 188    BNPNo results for input(s): BNP, PROBNP in the last 168 hours.   DDimer  Recent Labs  Lab 07/01/20 1739  DDIMER 0.62*     Radiology    No results found.  Cardiac Studies   CARDIAC CATHETERIZATION: 07/02/2020  1st Mrg lesion is 75% stenosed.  Mid Cx lesion is 80% stenosed.  Prox RCA lesion is 80% stenosed.  Mid LAD lesion is 50% stenosed.  Dist LAD lesion is 75% stenosed.  RPDA-1 lesion is 75% stenosed.  RPDA-2 lesion is 50% stenosed.  RPAV lesion is 50% stenosed.  Prox LAD to Mid LAD lesion is 75% stenosed.  The left ventricular systolic function is normal.  LV end diastolic pressure is normal.  The left ventricular ejection fraction is 55-65% by visual estimate.  There is no aortic valve stenosis.  Diffuse three vessel disease, particularly throughout the LAD.  Plan for cardiac surgery  consult.  Distal small vessel disease may affect quality of targets.  I stressed the importance of risk factor modification with the patient including lipid lowering therapy and BP control. Results discussed with his wife, 08/30/2020.   ECHOCARDIOGRAM COMPLETE: 07/01/2020  1. Left ventricular ejection fraction, by estimation, is 60 to 65%. The  left ventricle has normal function. The left ventricle has no regional  wall motion abnormalities. Left ventricular diastolic parameters are  consistent with Grade II diastolic  dysfunction (pseudonormalization). Elevated left atrial pressure.  2. Right ventricular systolic function is normal. The right ventricular  size is normal.  3. The mitral valve is grossly normal. Mild mitral valve regurgitation.  4. The aortic valve is normal in structure. Aortic valve regurgitation is  not visualized.   Patient Profile     55 y.o. male with a PMH of HTN, glaucoma, and recent COVID-19 infection (diagnosed on 06/18/20), with no prior cardiac history, though had not followed with a medical provider in about 20 years, who presented with chest pain, found to have an NSTEMI.  Assessment & Plan    1. NSTEMI: patient presented with intermittent chest pain and markedly elevated blood pressures. HsTrop peaked at 601 and trended down. EKG without ischemic changes. Echo showed EF 60-65%, G2DD, no RWMA, and mild MR. He underwent LHC 07/02/20 which showed diffuse 3-vessel CAD. He was recommended to TCTS evaluation and is tentatively scheduled for CABG 07/08/20.  - Continue heparin gtt for now - Continue aspirin and statin - Continue BBlocker  2. HTN: BP overall improved, though still generally above goal. Bradycardia limits titration of BBlocker - Will increased amlodipine to 10mg  daily - Continue carvedilol and losartan  3. HLD: LDL 99 this admission; goal <70. Started on atorvastatin 80mg  daily - Continue statin - Will need repeat FLP/LFTs in 6-8 weeks for close  monitoring  4. Pre-DM type 2: A1C 5.7 this admission - Encourage dietary/lifestyle modifications to prevent progression  5. Dysphagia: notes sensation of  food getting stuck in his throat on occasion. Wonders if this is esophageal strictures which his mother has a history of. He has not seen a gastroenterologist in the past - Consider outpatient GI referral at discharge  For questions or updates, please contact [facility] Please consult [email or web address] for contact info under        Signed, Beatriz Stallion, PA-C  07/06/2020, 12:59 PM     History and all data above reviewed.  Patient examined.  I agree with the findings as above. No further chest pain.  No SOB.    The patient exam reveals COR:RRR  ,  Lungs: Clear  ,  Abd: Positive bowel sounds, no rebound no guarding, Ext No edema  .  All available labs, radiology testing, previous records reviewed. Agree with documented assessment and plan.   NSTEMI:  Unstable angina.  CABG on Wed.  No change in therapy.  Dysphagia:  Needs to be noted by anesthesia in case TEE is needed.  We will plan out patient follow up.    Fayrene Fearing Adraine Biffle  3:09 PM  07/06/2020

## 2020-07-06 NOTE — Progress Notes (Signed)
ANTICOAGULATION CONSULT NOTE   Pharmacy Consult for heparin Indication: ACS/STEMI  No Known Allergies  Patient Measurements: Height: 5\' 9"  (175.3 cm) Weight: 96 kg (211 lb 9.6 oz) IBW/kg (Calculated) : 70.7 Heparin Dosing Weight: 91 kg  Vital Signs: Temp: 98.3 F (36.8 C) (02/07 0535) Temp Source: Oral (02/07 0535) BP: 146/122 (02/07 0858) Pulse Rate: 54 (02/07 0858)  Labs: Recent Labs    07/04/20 0245 07/05/20 0445 07/06/20 0216  HGB 13.4 14.0 14.0  HCT 40.6 42.3 43.1  PLT 185 165 188  HEPARINUNFRC 0.48 0.64 0.60  CREATININE 1.08 1.00 1.06    Estimated Creatinine Clearance: 93.2 mL/min (by C-G formula based on SCr of 1.06 mg/dL).  Assessment: 55 yo male s/p cath with multi-vessel dz. Heparin resumed post sheath removal. CABG planned for 2/9.  Heparin level therapeutic at 0.6 on gtt at 2,000 units/hr. CBC stable  Goal of Therapy:  Heparin level 0.3-0.7 units/ml Monitor platelets by anticoagulation protocol: Yes   Plan:  Continue IV heparin at 2,000 units/hr F/u daily heparin level and CBC, monitor for s/sx bleeding  4/9, PharmD Clinical Pharmacist **Pharmacist phone directory can now be found on [email or web address] ([email or web address]).  Listed under [facility].

## 2020-07-06 NOTE — Progress Notes (Signed)
CARDIAC REHAB PHASE I   PRE:  Rate/Rhythm: 65 SR    BP: sitting 150/90    SaO2:   MODE:  Ambulation: 790 ft   POST:  Rate/Rhythm: 74 SR    BP: sitting 177/90     SaO2:   Tolerated well, happy to walk. No CP, BP still up. 0100-7121   Leonard Donovan CES, ACSM 07/06/2020 3:00 PM

## 2020-07-07 ENCOUNTER — Inpatient Hospital Stay (HOSPITAL_COMMUNITY): Payer: BC Managed Care – PPO

## 2020-07-07 DIAGNOSIS — I214 Non-ST elevation (NSTEMI) myocardial infarction: Secondary | ICD-10-CM | POA: Diagnosis not present

## 2020-07-07 DIAGNOSIS — I2510 Atherosclerotic heart disease of native coronary artery without angina pectoris: Secondary | ICD-10-CM

## 2020-07-07 LAB — BLOOD GAS, ARTERIAL
Acid-base deficit: 1 mmol/L (ref 0.0–2.0)
Bicarbonate: 22.7 mmol/L (ref 20.0–28.0)
Drawn by: 36277
FIO2: 21
O2 Saturation: 96.3 %
Patient temperature: 37
pCO2 arterial: 34.7 mmHg (ref 32.0–48.0)
pH, Arterial: 7.432 (ref 7.350–7.450)
pO2, Arterial: 81.6 mmHg — ABNORMAL LOW (ref 83.0–108.0)

## 2020-07-07 LAB — CBC
HCT: 43.4 % (ref 39.0–52.0)
Hemoglobin: 14.4 g/dL (ref 13.0–17.0)
MCH: 28.7 pg (ref 26.0–34.0)
MCHC: 33.2 g/dL (ref 30.0–36.0)
MCV: 86.6 fL (ref 80.0–100.0)
Platelets: 182 10*3/uL (ref 150–400)
RBC: 5.01 MIL/uL (ref 4.22–5.81)
RDW: 13.6 % (ref 11.5–15.5)
WBC: 4.9 10*3/uL (ref 4.0–10.5)
nRBC: 0 % (ref 0.0–0.2)

## 2020-07-07 LAB — HEPARIN LEVEL (UNFRACTIONATED): Heparin Unfractionated: 0.67 IU/mL (ref 0.30–0.70)

## 2020-07-07 LAB — PROTIME-INR
INR: 1.1 (ref 0.8–1.2)
Prothrombin Time: 14.2 seconds (ref 11.4–15.2)

## 2020-07-07 LAB — COMPREHENSIVE METABOLIC PANEL
ALT: 19 U/L (ref 0–44)
AST: 26 U/L (ref 15–41)
Albumin: 3 g/dL — ABNORMAL LOW (ref 3.5–5.0)
Alkaline Phosphatase: 76 U/L (ref 38–126)
Anion gap: 9 (ref 5–15)
BUN: 15 mg/dL (ref 6–20)
CO2: 23 mmol/L (ref 22–32)
Calcium: 8.8 mg/dL — ABNORMAL LOW (ref 8.9–10.3)
Chloride: 109 mmol/L (ref 98–111)
Creatinine, Ser: 1 mg/dL (ref 0.61–1.24)
GFR, Estimated: >60 mL/min (ref >60)
Glucose, Bld: 90 mg/dL (ref 70–99)
Potassium: 3.6 mmol/L (ref 3.5–5.1)
Sodium: 141 mmol/L (ref 135–145)
Total Bilirubin: 0.9 mg/dL (ref 0.3–1.2)
Total Protein: 6.1 g/dL — ABNORMAL LOW (ref 6.5–8.1)

## 2020-07-07 LAB — URINALYSIS, ROUTINE W REFLEX MICROSCOPIC
Bilirubin Urine: NEGATIVE
Glucose, UA: NEGATIVE mg/dL
Ketones, ur: NEGATIVE mg/dL
Leukocytes,Ua: NEGATIVE
Nitrite: NEGATIVE
Protein, ur: NEGATIVE mg/dL
Specific Gravity, Urine: <1.005 — ABNORMAL LOW (ref 1.005–1.030)
pH: 6.5 (ref 5.0–8.0)

## 2020-07-07 LAB — SURGICAL PCR SCREEN
MRSA, PCR: NEGATIVE
Staphylococcus aureus: POSITIVE — AB

## 2020-07-07 LAB — TYPE AND SCREEN
ABO/RH(D): O POS
Antibody Screen: NEGATIVE

## 2020-07-07 LAB — URINALYSIS, MICROSCOPIC (REFLEX)
Bacteria, UA: NONE SEEN
Squamous Epithelial / HPF: NONE SEEN (ref 0–5)

## 2020-07-07 LAB — ABO/RH: ABO/RH(D): O POS

## 2020-07-07 MED ORDER — MILRINONE LACTATE IN DEXTROSE 20-5 MG/100ML-% IV SOLN
0.3000 ug/kg/min | INTRAVENOUS | Status: DC
  Filled 2020-07-07: qty 100

## 2020-07-07 MED ORDER — NITROGLYCERIN IN D5W 200-5 MCG/ML-% IV SOLN
2.0000 ug/min | INTRAVENOUS | Status: AC
  Administered 2020-07-08: 10 ug/min via INTRAVENOUS
  Filled 2020-07-07: qty 250

## 2020-07-07 MED ORDER — MAGNESIUM SULFATE 50 % IJ SOLN
40.0000 meq | INTRAMUSCULAR | Status: DC
  Filled 2020-07-07: qty 9.85

## 2020-07-07 MED ORDER — INSULIN REGULAR(HUMAN) IN NACL 100-0.9 UT/100ML-% IV SOLN
INTRAVENOUS | Status: AC
  Administered 2020-07-08: .7 [IU]/h via INTRAVENOUS
  Filled 2020-07-07: qty 100

## 2020-07-07 MED ORDER — TRANEXAMIC ACID (OHS) BOLUS VIA INFUSION
15.0000 mg/kg | INTRAVENOUS | Status: AC
  Administered 2020-07-08: 1432.5 mg via INTRAVENOUS
  Filled 2020-07-07: qty 1433

## 2020-07-07 MED ORDER — CHLORHEXIDINE GLUCONATE CLOTH 2 % EX PADS: 6.0000 | MEDICATED_PAD | Freq: Every day | CUTANEOUS | Status: DC

## 2020-07-07 MED ORDER — POTASSIUM CHLORIDE 2 MEQ/ML IV SOLN
80.0000 meq | INTRAVENOUS | Status: DC
  Filled 2020-07-07: qty 40

## 2020-07-07 MED ORDER — BISACODYL 5 MG PO TBEC
5.0000 mg | DELAYED_RELEASE_TABLET | Freq: Once | ORAL | Status: AC
  Administered 2020-07-07: 5 mg via ORAL
  Filled 2020-07-07: qty 1

## 2020-07-07 MED ORDER — SODIUM CHLORIDE 0.9 % IV SOLN
1.5000 g | INTRAVENOUS | Status: AC
  Administered 2020-07-08: 1.5 g via INTRAVENOUS
  Filled 2020-07-07: qty 1.5

## 2020-07-07 MED ORDER — CHLORHEXIDINE GLUCONATE CLOTH 2 % EX PADS
6.0000 | MEDICATED_PAD | Freq: Once | CUTANEOUS | Status: AC
  Administered 2020-07-08: 6 via TOPICAL

## 2020-07-07 MED ORDER — DEXMEDETOMIDINE HCL IN NACL 400 MCG/100ML IV SOLN
0.1000 ug/kg/h | INTRAVENOUS | Status: AC
  Administered 2020-07-08: .7 ug/kg/h via INTRAVENOUS
  Filled 2020-07-07: qty 100

## 2020-07-07 MED ORDER — VANCOMYCIN HCL 1500 MG/300ML IV SOLN
1500.0000 mg | INTRAVENOUS | Status: AC
  Administered 2020-07-08: 1500 mg via INTRAVENOUS
  Filled 2020-07-07: qty 300

## 2020-07-07 MED ORDER — CHLORHEXIDINE GLUCONATE CLOTH 2 % EX PADS
6.0000 | MEDICATED_PAD | Freq: Once | CUTANEOUS | Status: AC
  Administered 2020-07-07: 6 via TOPICAL

## 2020-07-07 MED ORDER — TRANEXAMIC ACID 1000 MG/10ML IV SOLN
1.5000 mg/kg/h | INTRAVENOUS | Status: AC
  Administered 2020-07-08: 1.5 mg/kg/h via INTRAVENOUS
  Filled 2020-07-07: qty 25

## 2020-07-07 MED ORDER — PHENYLEPHRINE HCL-NACL 20-0.9 MG/250ML-% IV SOLN
30.0000 ug/min | INTRAVENOUS | Status: AC
  Administered 2020-07-08: 25 ug/min via INTRAVENOUS
  Administered 2020-07-08: 15 ug/min via INTRAVENOUS
  Filled 2020-07-07: qty 250

## 2020-07-07 MED ORDER — SODIUM CHLORIDE 0.9 % IV SOLN
INTRAVENOUS | Status: DC
  Filled 2020-07-07: qty 30

## 2020-07-07 MED ORDER — EPINEPHRINE HCL 5 MG/250ML IV SOLN IN NS
0.0000 ug/min | INTRAVENOUS | Status: DC
  Filled 2020-07-07: qty 250

## 2020-07-07 MED ORDER — NOREPINEPHRINE 4 MG/250ML-% IV SOLN
0.0000 ug/min | INTRAVENOUS | Status: DC
  Filled 2020-07-07: qty 250

## 2020-07-07 MED ORDER — METOPROLOL TARTRATE 12.5 MG HALF TABLET
12.5000 mg | ORAL_TABLET | Freq: Once | ORAL | Status: AC
  Administered 2020-07-08: 12.5 mg via ORAL
  Filled 2020-07-07: qty 1

## 2020-07-07 MED ORDER — PLASMA-LYTE 148 IV SOLN
INTRAVENOUS | Status: DC
  Filled 2020-07-07: qty 2.5

## 2020-07-07 MED ORDER — TRANEXAMIC ACID (OHS) PUMP PRIME SOLUTION
2.0000 mg/kg | INTRAVENOUS | Status: DC
  Filled 2020-07-07: qty 1.91

## 2020-07-07 MED ORDER — SODIUM CHLORIDE 0.9 % IV SOLN
750.0000 mg | INTRAVENOUS | Status: AC
  Administered 2020-07-08: 750 mg via INTRAVENOUS
  Filled 2020-07-07: qty 750

## 2020-07-07 MED ORDER — CHLORHEXIDINE GLUCONATE 0.12 % MT SOLN
15.0000 mL | Freq: Once | OROMUCOSAL | Status: AC
  Administered 2020-07-08: 15 mL via OROMUCOSAL
  Filled 2020-07-07: qty 15

## 2020-07-07 MED ORDER — MUPIROCIN 2 % EX OINT
1.0000 "application " | TOPICAL_OINTMENT | Freq: Two times a day (BID) | CUTANEOUS | Status: AC
  Administered 2020-07-07 – 2020-07-12 (×10): 1 via NASAL
  Filled 2020-07-07 (×5): qty 22

## 2020-07-07 MED ORDER — TEMAZEPAM 15 MG PO CAPS
15.0000 mg | ORAL_CAPSULE | Freq: Once | ORAL | Status: AC | PRN
  Administered 2020-07-07: 15 mg via ORAL
  Filled 2020-07-07: qty 1

## 2020-07-07 NOTE — Progress Notes (Signed)
ANTICOAGULATION CONSULT NOTE   Pharmacy Consult for heparin Indication: ACS/STEMI  No Known Allergies  Patient Measurements: Height: 5\' 9"  (175.3 cm) Weight: 95.5 kg (210 lb 9.6 oz) IBW/kg (Calculated) : 70.7 Heparin Dosing Weight: 91 kg  Vital Signs: Temp: 98.1 F (36.7 C) (02/08 0333) Temp Source: Oral (02/08 0333) BP: 150/86 (02/08 0333) Pulse Rate: 49 (02/08 0333)  Labs: Recent Labs    07/05/20 0445 07/06/20 0216 07/07/20 0347  HGB 14.0 14.0 14.4  HCT 42.3 43.1 43.4  PLT 165 188 182  LABPROT  --   --  14.2  INR  --   --  1.1  HEPARINUNFRC 0.64 0.60 0.67  CREATININE 1.00 1.06 1.00    Estimated Creatinine Clearance: 98.5 mL/min (by C-G formula based on SCr of 1 mg/dL).  Assessment: 55 yo male s/p cath with multi-vessel dz. Heparin resumed post sheath removal. CABG planned for 2/9.  Heparin level therapeutic at 0.67 on gtt at 2,000 units/hr. CBC stable  Goal of Therapy:  Heparin level 0.3-0.7 units/ml Monitor platelets by anticoagulation protocol: Yes   Plan:  Continue IV heparin at 2,000 units/hr F/u daily heparin level and CBC, monitor for s/sx bleeding Scheduled for CABG in AM 2/9  4/9, RPh Clinical Pharmacist **Pharmacist phone directory can now be found on [email or web address] ([email or web address]).  Listed under [facility].

## 2020-07-07 NOTE — Progress Notes (Signed)
[address]       [place] [address]             [phone]                 5 Days Post-Op Procedure(s) (LRB): LEFT HEART CATH AND CORONARY ANGIOGRAPHY (N/A)  LOS: 5 days   Subjective: Stable, no chest pain  bp still poorly controlled   Objective: Vital signs in last 24 hours: Patient Vitals for the past 24 hrs:  BP Temp Temp src Pulse Resp SpO2 Weight  07/07/20 1136 131/86 98.2 F (36.8 C) Oral (!) 57 20 95 % --  07/07/20 0911 (!) 167/94 -- -- -- -- -- --  07/07/20 0333 (!) 150/86 98.1 F (36.7 C) Oral (!) 49 17 98 % 95.5 kg  07/06/20 2111 -- 97.9 F (36.6 C) Oral (!) 52 18 94 % --  07/06/20 2110 (!) 143/87 -- -- -- 20 94 % --  07/06/20 1614 (!) 152/86 -- -- 60 -- -- --    Filed Weights   07/05/20 0536 07/06/20 0535 07/07/20 0333  Weight: 96.3 kg 96 kg 95.5 kg    Hemodynamic parameters for last 24 hours:    Intake/Output from previous day: 02/07 0701 - 02/08 0700 In: 358 [P.O.:358] Out: 1250 [Urine:1250] Intake/Output this shift: Total I/O In: 718 [P.O.:718] Out: 1095 [Urine:1095]  Scheduled Meds: . amLODipine  10 mg Oral Daily  . aspirin EC  81 mg Oral Daily  . atorvastatin  80 mg Oral Daily  . carvedilol  12.5 mg Oral BID WC  . [START ON 07/08/2020] Chlorhexidine Gluconate Cloth  6 each Topical Q0600  . [START ON 07/08/2020] epinephrine  0-10 mcg/min Intravenous To OR  . [START ON 07/08/2020] heparin-papaverine-plasmalyte irrigation   Irrigation To OR  . [START ON 07/08/2020] insulin   Intravenous To OR  . losartan  50 mg Oral Daily  . [START ON 07/08/2020] magnesium sulfate  40 mEq Other To OR  . mupirocin ointment  1 application Nasal BID  . [START ON 07/08/2020] phenylephrine  30-200 mcg/min Intravenous To OR  . [START ON 07/08/2020] potassium chloride  80 mEq Other To OR  . sodium chloride flush  3 mL Intravenous Q12H  . [START ON 07/08/2020] tranexamic acid  15 mg/kg Intravenous To OR  . [START ON 07/08/2020] tranexamic acid  2 mg/kg  Intracatheter To OR   Continuous Infusions: . sodium chloride    . [START ON 07/08/2020] cefUROXime (ZINACEF)  IV    . [START ON 07/08/2020] cefUROXime (ZINACEF)  IV    . [START ON 07/08/2020] dexmedetomidine    . [START ON 07/08/2020] heparin 30,000 units/NS 1000 mL solution for CELLSAVER    . heparin 2,000 Units/hr (07/07/20 1414)  . [START ON 07/08/2020] milrinone    . [START ON 07/08/2020] nitroGLYCERIN    . [START ON 07/08/2020] norepinephrine    . [START ON 07/08/2020] tranexamic acid (CYKLOKAPRON) infusion ([facility])    . [START ON 07/08/2020] vancomycin     PRN Meds:.sodium chloride, acetaminophen, nitroGLYCERIN, ondansetron (ZOFRAN) IV, sodium chloride flush  General appearance: alert and cooperative Neurologic: intact Heart: regular rate and rhythm, S1, S2 normal, no murmur, click, rub or gallop Lungs: clear to auscultation bilaterally Abdomen: soft, non-tender; bowel sounds normal; no masses,  no organomegaly Extremities: extremities normal, atraumatic, no cyanosis or edema  Lab Results: CBC: Recent Labs    07/06/20 0216 07/07/20 0347  WBC 6.6 4.9  HGB 14.0 14.4  HCT 43.1 43.4  PLT 188 182   BMET:  Recent Labs    07/06/20 0216 07/07/20 0347  NA 137 141  K 3.7 3.6  CL 108 109  CO2 20* 23  GLUCOSE 97 90  BUN 15 15  CREATININE 1.06 1.00  CALCIUM 8.5* 8.8*    PT/INR:  Recent Labs    07/07/20 0347  LABPROT 14.2  INR 1.1     Radiology DG Chest 2 View  Result Date: 07/07/2020 CLINICAL DATA:  Coronary artery disease. Preoperative coronary artery bypass grafting. Chest pain. EXAM: CHEST - 2 VIEW COMPARISON:  July 01, 2020 FINDINGS: There is slight scarring in the upper lobe regions. There is no edema or airspace opacity. Heart is upper normal in size with pulmonary vascularity normal. No adenopathy. There is degenerative change in the thoracic spine. There is slight anterior wedging of a lower thoracic vertebral body. IMPRESSION: Slight scarring in the upper lobes. No edema  or airspace opacity. Heart upper normal in size. Electronically Signed   By: Bretta Bang III M.D.   On: 07/07/2020 08:08   I have independently reviewed the above radiology studies  and reviewed the findings with the patient.   Assessment/Plan: Plan to proceed with CABG tomorrow as discussed with patient and his wife. Will repeat allen test of hands/arm to evaluate for poss use of left radial The goals risks and alternatives of the planned surgical procedure Procedure(s): LEFT HEART CATH AND CORONARY ANGIOGRAPHY (N/A)  have been discussed with the patient in detail. The risks of the procedure including death, infection, stroke, myocardial infarction, bleeding, blood transfusion have all been discussed specifically.  I have quoted Leonard Donovan a 2 % of perioperative mortality and a complication rate as high as 30  %. The patient's questions have been answered.Leonard Donovan is willing  to proceed with the planned procedure.     Delight Ovens MD 07/07/2020 3:49 PM

## 2020-07-07 NOTE — Progress Notes (Signed)
Progress Note  Patient Name: Leonard Donovan Date of Encounter: 07/07/2020  Primary Cardiologist:   Tobias Alexander, MD   Subjective   No chest pain .  No SOB.    Inpatient Medications    Scheduled Meds: . amLODipine  10 mg Oral Daily  . aspirin EC  81 mg Oral Daily  . atorvastatin  80 mg Oral Daily  . carvedilol  12.5 mg Oral BID WC  . [START ON 07/08/2020] epinephrine  0-10 mcg/min Intravenous To OR  . [START ON 07/08/2020] heparin-papaverine-plasmalyte irrigation   Irrigation To OR  . [START ON 07/08/2020] insulin   Intravenous To OR  . losartan  50 mg Oral Daily  . [START ON 07/08/2020] magnesium sulfate  40 mEq Other To OR  . [START ON 07/08/2020] phenylephrine  30-200 mcg/min Intravenous To OR  . [START ON 07/08/2020] potassium chloride  80 mEq Other To OR  . sodium chloride flush  3 mL Intravenous Q12H  . [START ON 07/08/2020] tranexamic acid  15 mg/kg Intravenous To OR  . [START ON 07/08/2020] tranexamic acid  2 mg/kg Intracatheter To OR   Continuous Infusions: . sodium chloride    . [START ON 07/08/2020] cefUROXime (ZINACEF)  IV    . [START ON 07/08/2020] cefUROXime (ZINACEF)  IV    . [START ON 07/08/2020] dexmedetomidine    . [START ON 07/08/2020] heparin 30,000 units/NS 1000 mL solution for CELLSAVER    . heparin 2,000 Units/hr (07/07/20 0130)  . [START ON 07/08/2020] milrinone    . [START ON 07/08/2020] nitroGLYCERIN    . [START ON 07/08/2020] norepinephrine    . [START ON 07/08/2020] tranexamic acid (CYKLOKAPRON) infusion ([facility])    . [START ON 07/08/2020] vancomycin     PRN Meds: sodium chloride, acetaminophen, nitroGLYCERIN, ondansetron (ZOFRAN) IV, sodium chloride flush   Vital Signs    Vitals:   07/06/20 2110 07/06/20 2111 07/07/20 0333 07/07/20 0911  BP: (!) 143/87  (!) 150/86 (!) 167/94  Pulse:  (!) 52 (!) 49   Resp: 20 18 17    Temp:  97.9 F (36.6 C) 98.1 F (36.7 C)   TempSrc:  Oral Oral   SpO2: 94% 94% 98%   Weight:   95.5 kg   Height:        Intake/Output Summary  (Last 24 hours) at 07/07/2020 1040 Last data filed at 07/07/2020 09/04/2020 Gross per 24 hour  Intake 600 ml  Output 1775 ml  Net -1175 ml   Filed Weights   07/05/20 0536 07/06/20 0535 07/07/20 0333  Weight: 96.3 kg 96 kg 95.5 kg    Telemetry    NSR - Personally Reviewed  ECG    NA - Personally Reviewed  Physical Exam   GEN: No acute distress.   Neck: No  JVD Cardiac: RRR, no murmurs, rubs, or gallops.  Respiratory: Clear to auscultation bilaterally. GI: Soft, nontender, non-distended  MS: No edema; No deformity. Neuro:  Nonfocal  Psych: Normal affect   Labs    Chemistry Recent Labs  Lab 07/01/20 1739 07/03/20 0401 07/05/20 0445 07/06/20 0216 07/07/20 0347  NA 136   < > 138 137 141  K 4.1   < > 3.9 3.7 3.6  CL 101   < > 108 108 109  CO2 24   < > 21* 20* 23  GLUCOSE 101*   < > 94 97 90  BUN 20   < > 11 15 15   CREATININE 1.09   < > 1.00 1.06 1.00  CALCIUM 9.5   < > 8.7* 8.5* 8.8*  PROT 7.3  --   --   --  6.1*  ALBUMIN 4.2  --   --   --  3.0*  AST 23  --   --   --  26  ALT 17  --   --   --  19  ALKPHOS 98  --   --   --  76  BILITOT 0.6  --   --   --  0.9  GFRNONAA >60   < > >60 >60 >60  ANIONGAP 11   < > 9 9 9    < > = values in this interval not displayed.     Hematology Recent Labs  Lab 07/05/20 0445 07/06/20 0216 07/07/20 0347  WBC 7.0 6.6 4.9  RBC 4.88 4.91 5.01  HGB 14.0 14.0 14.4  HCT 42.3 43.1 43.4  MCV 86.7 87.8 86.6  MCH 28.7 28.5 28.7  MCHC 33.1 32.5 33.2  RDW 13.5 13.7 13.6  PLT 165 188 182    Cardiac EnzymesNo results for input(s): TROPONINI in the last 168 hours. No results for input(s): TROPIPOC in the last 168 hours.   BNPNo results for input(s): BNP, PROBNP in the last 168 hours.   DDimer  Recent Labs  Lab 07/01/20 1739  DDIMER 0.62*     Radiology    DG Chest 2 View  Result Date: 07/07/2020 CLINICAL DATA:  Coronary artery disease. Preoperative coronary artery bypass grafting. Chest pain. EXAM: CHEST - 2 VIEW COMPARISON:   July 01, 2020 FINDINGS: There is slight scarring in the upper lobe regions. There is no edema or airspace opacity. Heart is upper normal in size with pulmonary vascularity normal. No adenopathy. There is degenerative change in the thoracic spine. There is slight anterior wedging of a lower thoracic vertebral body. IMPRESSION: Slight scarring in the upper lobes. No edema or airspace opacity. Heart upper normal in size. Electronically Signed   By: July 03, 2020 III M.D.   On: 07/07/2020 08:08    Cardiac Studies   CARDIAC CATHETERIZATION: 07/02/2020  1st Mrg lesion is 75% stenosed.  Mid Cx lesion is 80% stenosed.  Prox RCA lesion is 80% stenosed.  Mid LAD lesion is 50% stenosed.  Dist LAD lesion is 75% stenosed.  RPDA-1 lesion is 75% stenosed.  RPDA-2 lesion is 50% stenosed.  RPAV lesion is 50% stenosed.  Prox LAD to Mid LAD lesion is 75% stenosed.  The left ventricular systolic function is normal.  LV end diastolic pressure is normal.  The left ventricular ejection fraction is 55-65% by visual estimate.  There is no aortic valve stenosis. Diffuse three vessel disease, particularly throughout the LAD. Plan for cardiac surgery consult. Distal small vessel disease may affect quality of targets. I stressed the importance of risk factor modification with the patient including lipid lowering therapy and BP control. Results discussed with his wife, 08/30/2020.   ECHOCARDIOGRAM COMPLETE: 07/01/2020  1. Left ventricular ejection fraction, by estimation, is 60 to 65%. The  left ventricle has normal function. The left ventricle has no regional  wall motion abnormalities. Left ventricular diastolic parameters are  consistent with Grade II diastolic  dysfunction (pseudonormalization). Elevated left atrial pressure.  2. Right ventricular systolic function is normal. The right ventricular  size is normal.  3. The mitral valve is grossly normal. Mild mitral valve regurgitation.  4. The aortic  valve is normal in structure. Aortic valve regurgitation is  not visualized.   Patient Profile  55 y.o. male with a PMH of HTN, glaucoma, and recent COVID-19 infection (diagnosed on 06/18/20), with no prior cardiac history, though had not followed with a medical provider in about 20 years, who presented with chest pain, found to have an NSTEMI.   Assessment & Plan      NSTEMI:    For CABG.  Preop work up underway.  Continue current therapy.     HTN: BP is mildly elevated still today but Norvasc increased yesterday.  No med titration today but will titrate and manage BP to goal before discharge.    HLD:    On high dose statin.    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  Diet management.     I had a long discussion today with the patient and his wife about diet.    Dysphagia:   Would plan out patient work up.    For questions or updates, please contact [facility] Please consult [email or web address] for contact info under Cardiology/STEMI.   Signed, Rollene Rotunda, MD  07/07/2020, 10:40 AM

## 2020-07-07 NOTE — Progress Notes (Signed)
CARDIAC REHAB PHASE I   PRE:  Rate/Rhythm: 62 SR    BP: sitting 121/89    SaO2:   MODE:  Ambulation: 790 ft   POST:  Rate/Rhythm: 64 SR    BP: sitting 141/82     SaO2:   Tolerated well. BP better today. Sts he had slight chest ache this am for a few minutes. None walking this afternoon. Set up preop video for pt and wife.  [id number]   Harriet Masson CES, ACSM 07/07/2020 2:29 PM

## 2020-07-07 NOTE — Progress Notes (Signed)
Spoke to [place] [facility] C[facility],  Told her to put tele on standby as pt will start clipping.  Tele was not put on back as soon as possible as pt requested to take a shower.  As  the time being patient  not connected to tele, patient was [facility] denies chest pain. Wife inside the room assisting patient. Will continue tele once pt is ready.

## 2020-07-07 NOTE — Progress Notes (Signed)
Patient denies chest pain or shortness of breath. He states he was awakend this am in order to be taken for a chest x ray. He is scheduled to undergo CABG in am with Dr. Tyrone Sage.

## 2020-07-08 ENCOUNTER — Inpatient Hospital Stay (HOSPITAL_COMMUNITY): Payer: BC Managed Care – PPO

## 2020-07-08 ENCOUNTER — Encounter (HOSPITAL_COMMUNITY): Payer: Self-pay | Admitting: Cardiology

## 2020-07-08 ENCOUNTER — Inpatient Hospital Stay (HOSPITAL_COMMUNITY): Payer: BC Managed Care – PPO | Admitting: Anesthesiology

## 2020-07-08 ENCOUNTER — Inpatient Hospital Stay (HOSPITAL_COMMUNITY)
Admission: EM | Disposition: A | Discharge status: Home / Self Care | Payer: Self-pay | Source: Home / Self Care | Attending: Cardiology

## 2020-07-08 DIAGNOSIS — I2510 Atherosclerotic heart disease of native coronary artery without angina pectoris: Secondary | ICD-10-CM | POA: Diagnosis present

## 2020-07-08 HISTORY — PX: ENDOVEIN HARVEST OF GREATER SAPHENOUS VEIN: SHX5059

## 2020-07-08 HISTORY — PX: TEE WITHOUT CARDIOVERSION: SHX5443

## 2020-07-08 HISTORY — PX: CORONARY ARTERY BYPASS GRAFT: SHX141

## 2020-07-08 LAB — POCT I-STAT 7, (LYTES, BLD GAS, ICA,H+H)
Acid-Base Excess: 0 mmol/L (ref 0.0–2.0)
Acid-Base Excess: 0 mmol/L (ref 0.0–2.0)
Acid-base deficit: 1 mmol/L (ref 0.0–2.0)
Acid-base deficit: 1 mmol/L (ref 0.0–2.0)
Acid-base deficit: 2 mmol/L (ref 0.0–2.0)
Acid-base deficit: 3 mmol/L — ABNORMAL HIGH (ref 0.0–2.0)
Acid-base deficit: 4 mmol/L — ABNORMAL HIGH (ref 0.0–2.0)
Acid-base deficit: 4 mmol/L — ABNORMAL HIGH (ref 0.0–2.0)
Bicarbonate: 25.5 mmol/L (ref 20.0–28.0)
Bicarbonate: 22.7 mmol/L (ref 20.0–28.0)
Bicarbonate: 23.6 mmol/L (ref 20.0–28.0)
Bicarbonate: 24.7 mmol/L (ref 20.0–28.0)
Bicarbonate: 22.6 mmol/L (ref 20.0–28.0)
Bicarbonate: 22.5 mmol/L (ref 20.0–28.0)
Bicarbonate: 21.6 mmol/L (ref 20.0–28.0)
Bicarbonate: 20.8 mmol/L (ref 20.0–28.0)
Calcium, Ion: 1.27 mmol/L (ref 1.15–1.40)
Calcium, Ion: 1 mmol/L — ABNORMAL LOW (ref 1.15–1.40)
Calcium, Ion: 1.06 mmol/L — ABNORMAL LOW (ref 1.15–1.40)
Calcium, Ion: 1.09 mmol/L — ABNORMAL LOW (ref 1.15–1.40)
Calcium, Ion: 1.13 mmol/L — ABNORMAL LOW (ref 1.15–1.40)
Calcium, Ion: 1.16 mmol/L (ref 1.15–1.40)
Calcium, Ion: 1.18 mmol/L (ref 1.15–1.40)
Calcium, Ion: 1.14 mmol/L — ABNORMAL LOW (ref 1.15–1.40)
HCT: 41 % (ref 39.0–52.0)
HCT: 33 % — ABNORMAL LOW (ref 39.0–52.0)
HCT: 30 % — ABNORMAL LOW (ref 39.0–52.0)
HCT: 29 % — ABNORMAL LOW (ref 39.0–52.0)
HCT: 29 % — ABNORMAL LOW (ref 39.0–52.0)
HCT: 35 % — ABNORMAL LOW (ref 39.0–52.0)
HCT: 37 % — ABNORMAL LOW (ref 39.0–52.0)
HCT: 34 % — ABNORMAL LOW (ref 39.0–52.0)
Hemoglobin: 13.9 g/dL (ref 13.0–17.0)
Hemoglobin: 11.2 g/dL — ABNORMAL LOW (ref 13.0–17.0)
Hemoglobin: 10.2 g/dL — ABNORMAL LOW (ref 13.0–17.0)
Hemoglobin: 9.9 g/dL — ABNORMAL LOW (ref 13.0–17.0)
Hemoglobin: 9.9 g/dL — ABNORMAL LOW (ref 13.0–17.0)
Hemoglobin: 11.9 g/dL — ABNORMAL LOW (ref 13.0–17.0)
Hemoglobin: 12.6 g/dL — ABNORMAL LOW (ref 13.0–17.0)
Hemoglobin: 11.6 g/dL — ABNORMAL LOW (ref 13.0–17.0)
O2 Saturation: 100 %
O2 Saturation: 100 %
O2 Saturation: 100 %
O2 Saturation: 100 %
O2 Saturation: 93 %
O2 Saturation: 94 %
O2 Saturation: 96 %
O2 Saturation: 93 %
Patient temperature: 36.5
Patient temperature: 37.1
Patient temperature: 37.3
Potassium: 4 mmol/L (ref 3.5–5.1)
Potassium: 4.7 mmol/L (ref 3.5–5.1)
Potassium: 4.8 mmol/L (ref 3.5–5.1)
Potassium: 5.5 mmol/L — ABNORMAL HIGH (ref 3.5–5.1)
Potassium: 4.2 mmol/L (ref 3.5–5.1)
Potassium: 3.9 mmol/L (ref 3.5–5.1)
Potassium: 4.8 mmol/L (ref 3.5–5.1)
Potassium: 4.4 mmol/L (ref 3.5–5.1)
Sodium: 141 mmol/L (ref 135–145)
Sodium: 143 mmol/L (ref 135–145)
Sodium: 138 mmol/L (ref 135–145)
Sodium: 138 mmol/L (ref 135–145)
Sodium: 141 mmol/L (ref 135–145)
Sodium: 142 mmol/L (ref 135–145)
Sodium: 141 mmol/L (ref 135–145)
Sodium: 142 mmol/L (ref 135–145)
TCO2: 27 mmol/L (ref 22–32)
TCO2: 24 mmol/L (ref 22–32)
TCO2: 25 mmol/L (ref 22–32)
TCO2: 26 mmol/L (ref 22–32)
TCO2: 24 mmol/L (ref 22–32)
TCO2: 24 mmol/L (ref 22–32)
TCO2: 23 mmol/L (ref 22–32)
TCO2: 22 mmol/L (ref 22–32)
pCO2 arterial: 45.7 mmHg (ref 32.0–48.0)
pCO2 arterial: 34.5 mmHg (ref 32.0–48.0)
pCO2 arterial: 37.6 mmHg (ref 32.0–48.0)
pCO2 arterial: 41.3 mmHg (ref 32.0–48.0)
pCO2 arterial: 38.4 mmHg (ref 32.0–48.0)
pCO2 arterial: 38.5 mmHg (ref 32.0–48.0)
pCO2 arterial: 39.4 mmHg (ref 32.0–48.0)
pCO2 arterial: 37.1 mmHg (ref 32.0–48.0)
pH, Arterial: 7.354 (ref 7.350–7.450)
pH, Arterial: 7.425 (ref 7.350–7.450)
pH, Arterial: 7.405 (ref 7.350–7.450)
pH, Arterial: 7.385 (ref 7.350–7.450)
pH, Arterial: 7.377 (ref 7.350–7.450)
pH, Arterial: 7.372 (ref 7.350–7.450)
pH, Arterial: 7.347 — ABNORMAL LOW (ref 7.350–7.450)
pH, Arterial: 7.358 (ref 7.350–7.450)
pO2, Arterial: 222 mmHg — ABNORMAL HIGH (ref 83.0–108.0)
pO2, Arterial: 331 mmHg — ABNORMAL HIGH (ref 83.0–108.0)
pO2, Arterial: 322 mmHg — ABNORMAL HIGH (ref 83.0–108.0)
pO2, Arterial: 293 mmHg — ABNORMAL HIGH (ref 83.0–108.0)
pO2, Arterial: 69 mmHg — ABNORMAL LOW (ref 83.0–108.0)
pO2, Arterial: 69 mmHg — ABNORMAL LOW (ref 83.0–108.0)
pO2, Arterial: 85 mmHg (ref 83.0–108.0)
pO2, Arterial: 69 mmHg — ABNORMAL LOW (ref 83.0–108.0)

## 2020-07-08 LAB — BASIC METABOLIC PANEL
Anion gap: 9 (ref 5–15)
Anion gap: 9 (ref 5–15)
BUN: 15 mg/dL (ref 6–20)
BUN: 14 mg/dL (ref 6–20)
CO2: 22 mmol/L (ref 22–32)
CO2: 19 mmol/L — ABNORMAL LOW (ref 22–32)
Calcium: 9 mg/dL (ref 8.9–10.3)
Calcium: 7.6 mg/dL — ABNORMAL LOW (ref 8.9–10.3)
Chloride: 107 mmol/L (ref 98–111)
Chloride: 108 mmol/L (ref 98–111)
Creatinine, Ser: 0.99 mg/dL (ref 0.61–1.24)
Creatinine, Ser: 0.96 mg/dL (ref 0.61–1.24)
GFR, Estimated: >60 mL/min (ref >60)
GFR, Estimated: >60 mL/min (ref >60)
Glucose, Bld: 99 mg/dL (ref 70–99)
Glucose, Bld: 186 mg/dL — ABNORMAL HIGH (ref 70–99)
Potassium: 4.1 mmol/L (ref 3.5–5.1)
Potassium: 4.3 mmol/L (ref 3.5–5.1)
Sodium: 138 mmol/L (ref 135–145)
Sodium: 136 mmol/L (ref 135–145)

## 2020-07-08 LAB — POCT I-STAT, CHEM 8
BUN: 16 mg/dL (ref 6–20)
BUN: 15 mg/dL (ref 6–20)
BUN: 14 mg/dL (ref 6–20)
BUN: 15 mg/dL (ref 6–20)
BUN: 13 mg/dL (ref 6–20)
BUN: 14 mg/dL (ref 6–20)
BUN: 14 mg/dL (ref 6–20)
Calcium, Ion: 1.22 mmol/L (ref 1.15–1.40)
Calcium, Ion: 1.19 mmol/L (ref 1.15–1.40)
Calcium, Ion: 1.06 mmol/L — ABNORMAL LOW (ref 1.15–1.40)
Calcium, Ion: 1.23 mmol/L (ref 1.15–1.40)
Calcium, Ion: 1 mmol/L — ABNORMAL LOW (ref 1.15–1.40)
Calcium, Ion: 1.09 mmol/L — ABNORMAL LOW (ref 1.15–1.40)
Calcium, Ion: 1.06 mmol/L — ABNORMAL LOW (ref 1.15–1.40)
Chloride: 106 mmol/L (ref 98–111)
Chloride: 108 mmol/L (ref 98–111)
Chloride: 105 mmol/L (ref 98–111)
Chloride: 109 mmol/L (ref 98–111)
Chloride: 104 mmol/L (ref 98–111)
Chloride: 106 mmol/L (ref 98–111)
Chloride: 105 mmol/L (ref 98–111)
Creatinine, Ser: 0.9 mg/dL (ref 0.61–1.24)
Creatinine, Ser: 0.8 mg/dL (ref 0.61–1.24)
Creatinine, Ser: 0.7 mg/dL (ref 0.61–1.24)
Creatinine, Ser: 0.8 mg/dL (ref 0.61–1.24)
Creatinine, Ser: 0.9 mg/dL (ref 0.61–1.24)
Creatinine, Ser: 0.9 mg/dL (ref 0.61–1.24)
Creatinine, Ser: 0.9 mg/dL (ref 0.61–1.24)
Glucose, Bld: 96 mg/dL (ref 70–99)
Glucose, Bld: 101 mg/dL — ABNORMAL HIGH (ref 70–99)
Glucose, Bld: 100 mg/dL — ABNORMAL HIGH (ref 70–99)
Glucose, Bld: 94 mg/dL (ref 70–99)
Glucose, Bld: 118 mg/dL — ABNORMAL HIGH (ref 70–99)
Glucose, Bld: 126 mg/dL — ABNORMAL HIGH (ref 70–99)
Glucose, Bld: 132 mg/dL — ABNORMAL HIGH (ref 70–99)
HCT: 40 % (ref 39.0–52.0)
HCT: 39 % (ref 39.0–52.0)
HCT: 30 % — ABNORMAL LOW (ref 39.0–52.0)
HCT: 35 % — ABNORMAL LOW (ref 39.0–52.0)
HCT: 26 % — ABNORMAL LOW (ref 39.0–52.0)
HCT: 30 % — ABNORMAL LOW (ref 39.0–52.0)
HCT: 29 % — ABNORMAL LOW (ref 39.0–52.0)
Hemoglobin: 13.6 g/dL (ref 13.0–17.0)
Hemoglobin: 13.3 g/dL (ref 13.0–17.0)
Hemoglobin: 10.2 g/dL — ABNORMAL LOW (ref 13.0–17.0)
Hemoglobin: 11.9 g/dL — ABNORMAL LOW (ref 13.0–17.0)
Hemoglobin: 8.8 g/dL — ABNORMAL LOW (ref 13.0–17.0)
Hemoglobin: 10.2 g/dL — ABNORMAL LOW (ref 13.0–17.0)
Hemoglobin: 9.9 g/dL — ABNORMAL LOW (ref 13.0–17.0)
Potassium: 4 mmol/L (ref 3.5–5.1)
Potassium: 4.3 mmol/L (ref 3.5–5.1)
Potassium: 4.4 mmol/L (ref 3.5–5.1)
Potassium: 4.7 mmol/L (ref 3.5–5.1)
Potassium: 5.1 mmol/L (ref 3.5–5.1)
Potassium: 5.5 mmol/L — ABNORMAL HIGH (ref 3.5–5.1)
Potassium: 4.2 mmol/L (ref 3.5–5.1)
Sodium: 141 mmol/L (ref 135–145)
Sodium: 142 mmol/L (ref 135–145)
Sodium: 140 mmol/L (ref 135–145)
Sodium: 141 mmol/L (ref 135–145)
Sodium: 137 mmol/L (ref 135–145)
Sodium: 138 mmol/L (ref 135–145)
Sodium: 141 mmol/L (ref 135–145)
TCO2: 25 mmol/L (ref 22–32)
TCO2: 23 mmol/L (ref 22–32)
TCO2: 24 mmol/L (ref 22–32)
TCO2: 25 mmol/L (ref 22–32)
TCO2: 24 mmol/L (ref 22–32)
TCO2: 23 mmol/L (ref 22–32)
TCO2: 22 mmol/L (ref 22–32)

## 2020-07-08 LAB — CBC
HCT: 45 % (ref 39.0–52.0)
HCT: 36.4 % — ABNORMAL LOW (ref 39.0–52.0)
HCT: 37.8 % — ABNORMAL LOW (ref 39.0–52.0)
Hemoglobin: 15.2 g/dL (ref 13.0–17.0)
Hemoglobin: 12.8 g/dL — ABNORMAL LOW (ref 13.0–17.0)
Hemoglobin: 12.2 g/dL — ABNORMAL LOW (ref 13.0–17.0)
MCH: 29 pg (ref 26.0–34.0)
MCH: 30.3 pg (ref 26.0–34.0)
MCH: 28.4 pg (ref 26.0–34.0)
MCHC: 33.8 g/dL (ref 30.0–36.0)
MCHC: 35.2 g/dL (ref 30.0–36.0)
MCHC: 32.3 g/dL (ref 30.0–36.0)
MCV: 85.9 fL (ref 80.0–100.0)
MCV: 86.1 fL (ref 80.0–100.0)
MCV: 87.9 fL (ref 80.0–100.0)
Platelets: 204 10*3/uL (ref 150–400)
Platelets: 124 10*3/uL — ABNORMAL LOW (ref 150–400)
Platelets: 162 10*3/uL (ref 150–400)
RBC: 5.24 MIL/uL (ref 4.22–5.81)
RBC: 4.23 MIL/uL (ref 4.22–5.81)
RBC: 4.3 MIL/uL (ref 4.22–5.81)
RDW: 13.4 % (ref 11.5–15.5)
RDW: 13.7 % (ref 11.5–15.5)
RDW: 13.7 % (ref 11.5–15.5)
WBC: 6.4 10*3/uL (ref 4.0–10.5)
WBC: 8 10*3/uL (ref 4.0–10.5)
WBC: 10.5 10*3/uL (ref 4.0–10.5)
nRBC: 0 % (ref 0.0–0.2)
nRBC: 0 % (ref 0.0–0.2)
nRBC: 0 % (ref 0.0–0.2)

## 2020-07-08 LAB — HEMOGLOBIN AND HEMATOCRIT, BLOOD
HCT: 31 % — ABNORMAL LOW (ref 39.0–52.0)
Hemoglobin: 10.6 g/dL — ABNORMAL LOW (ref 13.0–17.0)

## 2020-07-08 LAB — POCT I-STAT EG7
Acid-Base Excess: 0 mmol/L (ref 0.0–2.0)
Bicarbonate: 24.9 mmol/L (ref 20.0–28.0)
Calcium, Ion: 1.01 mmol/L — ABNORMAL LOW (ref 1.15–1.40)
HCT: 32 % — ABNORMAL LOW (ref 39.0–52.0)
Hemoglobin: 10.9 g/dL — ABNORMAL LOW (ref 13.0–17.0)
O2 Saturation: 84 %
Potassium: 4.7 mmol/L (ref 3.5–5.1)
Sodium: 143 mmol/L (ref 135–145)
TCO2: 26 mmol/L (ref 22–32)
pCO2, Ven: 42.4 mmHg — ABNORMAL LOW (ref 44.0–60.0)
pH, Ven: 7.377 (ref 7.250–7.430)
pO2, Ven: 50 mmHg — ABNORMAL HIGH (ref 32.0–45.0)

## 2020-07-08 LAB — APTT: aPTT: 30 seconds (ref 24–36)

## 2020-07-08 LAB — PROTIME-INR
INR: 1.4 — ABNORMAL HIGH (ref 0.8–1.2)
Prothrombin Time: 17 seconds — ABNORMAL HIGH (ref 11.4–15.2)

## 2020-07-08 LAB — ECHO INTRAOPERATIVE TEE
Area-P 1/2: 2.96 cm2
Height: 69 in
S' Lateral: 3.7 cm
Weight: 3356.8 oz

## 2020-07-08 LAB — GLUCOSE, CAPILLARY
Glucose-Capillary: 112 mg/dL — ABNORMAL HIGH (ref 70–99)
Glucose-Capillary: 115 mg/dL — ABNORMAL HIGH (ref 70–99)
Glucose-Capillary: 115 mg/dL — ABNORMAL HIGH (ref 70–99)
Glucose-Capillary: 130 mg/dL — ABNORMAL HIGH (ref 70–99)
Glucose-Capillary: 131 mg/dL — ABNORMAL HIGH (ref 70–99)
Glucose-Capillary: 144 mg/dL — ABNORMAL HIGH (ref 70–99)
Glucose-Capillary: 146 mg/dL — ABNORMAL HIGH (ref 70–99)
Glucose-Capillary: 187 mg/dL — ABNORMAL HIGH (ref 70–99)

## 2020-07-08 LAB — HEPARIN LEVEL (UNFRACTIONATED): Heparin Unfractionated: 0.67 IU/mL (ref 0.30–0.70)

## 2020-07-08 LAB — MAGNESIUM: Magnesium: 2.9 mg/dL — ABNORMAL HIGH (ref 1.7–2.4)

## 2020-07-08 LAB — PLATELET COUNT: Platelets: 175 10*3/uL (ref 150–400)

## 2020-07-08 SURGERY — CORONARY ARTERY BYPASS GRAFTING (CABG)
Anesthesia: General | Site: Leg Upper | Laterality: Right

## 2020-07-08 MED ORDER — SODIUM CHLORIDE 0.9 % IV SOLN: INTRAVENOUS | Status: DC

## 2020-07-08 MED ORDER — PROTAMINE SULFATE 10 MG/ML IV SOLN
INTRAVENOUS | Status: AC
  Filled 2020-07-08: qty 5

## 2020-07-08 MED ORDER — LACTATED RINGERS IV SOLN: INTRAVENOUS | Status: DC | PRN

## 2020-07-08 MED ORDER — DEXMEDETOMIDINE HCL IN NACL 400 MCG/100ML IV SOLN
0.0000 ug/kg/h | INTRAVENOUS | Status: DC
  Administered 2020-07-08: 0.5 ug/kg/h via INTRAVENOUS
  Filled 2020-07-08: qty 100

## 2020-07-08 MED ORDER — PLASMA-LYTE 148 IV SOLN
INTRAVENOUS | Status: DC | PRN
  Administered 2020-07-08: 500 mL

## 2020-07-08 MED ORDER — TRAMADOL HCL 50 MG PO TABS
50.0000 mg | ORAL_TABLET | ORAL | Status: DC | PRN
  Administered 2020-07-09 – 2020-07-10 (×3): 100 mg via ORAL
  Administered 2020-07-11: 50 mg via ORAL
  Filled 2020-07-08 (×2): qty 2
  Filled 2020-07-08 (×2): qty 1
  Filled 2020-07-08 (×2): qty 2

## 2020-07-08 MED ORDER — PANTOPRAZOLE SODIUM 40 MG PO TBEC
40.0000 mg | DELAYED_RELEASE_TABLET | Freq: Every day | ORAL | Status: DC
  Administered 2020-07-10 – 2020-07-13 (×4): 40 mg via ORAL
  Filled 2020-07-08 (×4): qty 1

## 2020-07-08 MED ORDER — VANCOMYCIN HCL IN DEXTROSE 1-5 GM/200ML-% IV SOLN
1000.0000 mg | Freq: Once | INTRAVENOUS | Status: AC
  Administered 2020-07-08: 1000 mg via INTRAVENOUS
  Filled 2020-07-08: qty 200

## 2020-07-08 MED ORDER — ROCURONIUM BROMIDE 10 MG/ML (PF) SYRINGE
PREFILLED_SYRINGE | INTRAVENOUS | Status: AC
  Filled 2020-07-08: qty 10

## 2020-07-08 MED ORDER — NITROGLYCERIN IN D5W 200-5 MCG/ML-% IV SOLN: 0.0000 ug/min | INTRAVENOUS | Status: DC

## 2020-07-08 MED ORDER — ORAL CARE MOUTH RINSE
15.0000 mL | OROMUCOSAL | Status: DC
  Administered 2020-07-08 – 2020-07-09 (×3): 15 mL via OROMUCOSAL

## 2020-07-08 MED ORDER — DEXTROSE 50 % IV SOLN
0.0000 mL | INTRAVENOUS | Status: DC | PRN
Start: 2020-07-08 — End: 2020-07-11

## 2020-07-08 MED ORDER — FENTANYL CITRATE (PF) 250 MCG/5ML IJ SOLN
INTRAMUSCULAR | Status: DC | PRN
  Administered 2020-07-08: 100 ug via INTRAVENOUS
  Administered 2020-07-08: 200 ug via INTRAVENOUS
  Administered 2020-07-08: 250 ug via INTRAVENOUS
  Administered 2020-07-08: 50 ug via INTRAVENOUS
  Administered 2020-07-08: 250 ug via INTRAVENOUS
  Administered 2020-07-08: 50 ug via INTRAVENOUS
  Administered 2020-07-08: 100 ug via INTRAVENOUS
  Administered 2020-07-08: 250 ug via INTRAVENOUS
  Administered 2020-07-08: 50 ug via INTRAVENOUS
  Administered 2020-07-08: 200 ug via INTRAVENOUS

## 2020-07-08 MED ORDER — SODIUM CHLORIDE 0.45 % IV SOLN: INTRAVENOUS | Status: DC | PRN

## 2020-07-08 MED ORDER — SODIUM CHLORIDE 0.9% FLUSH
3.0000 mL | Freq: Two times a day (BID) | INTRAVENOUS | Status: DC
  Administered 2020-07-09 – 2020-07-13 (×8): 3 mL via INTRAVENOUS

## 2020-07-08 MED ORDER — ACETAMINOPHEN 160 MG/5ML PO SOLN: 650.0000 mg | Freq: Once | ORAL | Status: AC

## 2020-07-08 MED ORDER — ALBUMIN HUMAN 5 % IV SOLN
250.0000 mL | INTRAVENOUS | Status: AC | PRN
  Administered 2020-07-08: 12.5 g via INTRAVENOUS

## 2020-07-08 MED ORDER — HEPARIN SODIUM (PORCINE) 1000 UNIT/ML IJ SOLN
INTRAMUSCULAR | Status: AC
  Filled 2020-07-08: qty 1

## 2020-07-08 MED ORDER — SODIUM CHLORIDE (PF) 0.9 % IJ SOLN
OROMUCOSAL | Status: DC | PRN
  Administered 2020-07-08: 12 mL via TOPICAL

## 2020-07-08 MED ORDER — MIDAZOLAM HCL 2 MG/2ML IJ SOLN: 2.0000 mg | INTRAMUSCULAR | Status: DC | PRN

## 2020-07-08 MED ORDER — ACETAMINOPHEN 160 MG/5ML PO SOLN: 1000.0000 mg | Freq: Four times a day (QID) | ORAL | Status: DC

## 2020-07-08 MED ORDER — POTASSIUM CHLORIDE 10 MEQ/50ML IV SOLN
10.0000 meq | INTRAVENOUS | Status: AC
  Administered 2020-07-08 (×2): 10 meq via INTRAVENOUS

## 2020-07-08 MED ORDER — METOPROLOL TARTRATE 25 MG/10 ML ORAL SUSPENSION: 12.5000 mg | Freq: Two times a day (BID) | ORAL | Status: DC

## 2020-07-08 MED ORDER — CHLORHEXIDINE GLUCONATE CLOTH 2 % EX PADS
6.0000 | MEDICATED_PAD | Freq: Every day | CUTANEOUS | Status: DC
  Administered 2020-07-08 – 2020-07-13 (×5): 6 via TOPICAL

## 2020-07-08 MED ORDER — PROPOFOL 10 MG/ML IV BOLUS
INTRAVENOUS | Status: AC
  Filled 2020-07-08: qty 20

## 2020-07-08 MED ORDER — BISACODYL 5 MG PO TBEC
10.0000 mg | DELAYED_RELEASE_TABLET | Freq: Every day | ORAL | Status: DC
  Administered 2020-07-09 – 2020-07-11 (×3): 10 mg via ORAL
  Filled 2020-07-08 (×3): qty 2

## 2020-07-08 MED ORDER — PROTAMINE SULFATE 10 MG/ML IV SOLN
INTRAVENOUS | Status: AC
  Filled 2020-07-08: qty 25

## 2020-07-08 MED ORDER — METOPROLOL TARTRATE 12.5 MG HALF TABLET
12.5000 mg | ORAL_TABLET | Freq: Two times a day (BID) | ORAL | Status: DC
  Filled 2020-07-08: qty 1

## 2020-07-08 MED ORDER — ACETAMINOPHEN 650 MG RE SUPP
650.0000 mg | Freq: Once | RECTAL | Status: AC
  Administered 2020-07-08: 650 mg via RECTAL

## 2020-07-08 MED ORDER — DOCUSATE SODIUM 100 MG PO CAPS
200.0000 mg | ORAL_CAPSULE | Freq: Every day | ORAL | Status: DC
  Administered 2020-07-09 – 2020-07-11 (×3): 200 mg via ORAL
  Filled 2020-07-08 (×3): qty 2

## 2020-07-08 MED ORDER — LACTATED RINGERS IV SOLN: INTRAVENOUS | Status: DC

## 2020-07-08 MED ORDER — SODIUM CHLORIDE 0.9 % IV SOLN
1.5000 g | Freq: Two times a day (BID) | INTRAVENOUS | Status: AC
  Administered 2020-07-08 – 2020-07-10 (×4): 1.5 g via INTRAVENOUS
  Filled 2020-07-08 (×4): qty 1.5

## 2020-07-08 MED ORDER — EPHEDRINE SULFATE-NACL 50-0.9 MG/10ML-% IV SOSY
PREFILLED_SYRINGE | INTRAVENOUS | Status: DC | PRN
  Administered 2020-07-08 (×2): 10 mg via INTRAVENOUS
  Administered 2020-07-08: 5 mg via INTRAVENOUS
  Administered 2020-07-08: 10 mg via INTRAVENOUS

## 2020-07-08 MED ORDER — DOPAMINE-DEXTROSE 3.2-5 MG/ML-% IV SOLN
0.0000 ug/kg/min | INTRAVENOUS | Status: DC
  Filled 2020-07-08: qty 250

## 2020-07-08 MED ORDER — MIDAZOLAM HCL 2 MG/2ML IJ SOLN
INTRAMUSCULAR | Status: AC
  Filled 2020-07-08: qty 2

## 2020-07-08 MED ORDER — ASPIRIN EC 325 MG PO TBEC: 325.0000 mg | DELAYED_RELEASE_TABLET | Freq: Every day | ORAL | Status: DC

## 2020-07-08 MED ORDER — FENTANYL CITRATE (PF) 250 MCG/5ML IJ SOLN
INTRAMUSCULAR | Status: AC
  Filled 2020-07-08: qty 25

## 2020-07-08 MED ORDER — CHLORHEXIDINE GLUCONATE 0.12 % MT SOLN
15.0000 mL | OROMUCOSAL | Status: AC
  Administered 2020-07-08: 15 mL via OROMUCOSAL

## 2020-07-08 MED ORDER — HEMOSTATIC AGENTS (NO CHARGE) OPTIME
TOPICAL | Status: DC | PRN
Start: 2020-07-08 — End: 2020-07-08
  Administered 2020-07-08 (×2): 1 via TOPICAL

## 2020-07-08 MED ORDER — LACTATED RINGERS IV SOLN: 500.0000 mL | Freq: Once | INTRAVENOUS | Status: DC | PRN

## 2020-07-08 MED ORDER — DOPAMINE-DEXTROSE 1.6-5 MG/ML-% IV SOLN
INTRAVENOUS | Status: DC | PRN
  Administered 2020-07-08: 3 ug/kg/min via INTRAVENOUS

## 2020-07-08 MED ORDER — ROCURONIUM BROMIDE 100 MG/10ML IV SOLN
INTRAVENOUS | Status: DC | PRN
  Administered 2020-07-08 (×3): 50 mg via INTRAVENOUS
  Administered 2020-07-08: 100 mg via INTRAVENOUS
  Administered 2020-07-08: 50 mg via INTRAVENOUS

## 2020-07-08 MED ORDER — ASPIRIN 81 MG PO CHEW: 324.0000 mg | CHEWABLE_TABLET | Freq: Every day | ORAL | Status: DC

## 2020-07-08 MED ORDER — PHENYLEPHRINE HCL-NACL 20-0.9 MG/250ML-% IV SOLN: 0.0000 ug/min | INTRAVENOUS | Status: DC

## 2020-07-08 MED ORDER — MORPHINE SULFATE (PF) 2 MG/ML IV SOLN
1.0000 mg | INTRAVENOUS | Status: DC | PRN
Start: 2020-07-08 — End: 2020-07-09
  Administered 2020-07-08: 4 mg via INTRAVENOUS
  Administered 2020-07-09: 2 mg via INTRAVENOUS
  Filled 2020-07-08 (×3): qty 1

## 2020-07-08 MED ORDER — MIDAZOLAM HCL (PF) 10 MG/2ML IJ SOLN
INTRAMUSCULAR | Status: AC
  Filled 2020-07-08: qty 2

## 2020-07-08 MED ORDER — 0.9 % SODIUM CHLORIDE (POUR BTL) OPTIME
TOPICAL | Status: DC | PRN
  Administered 2020-07-08: 5000 mL

## 2020-07-08 MED ORDER — ONDANSETRON HCL 4 MG/2ML IJ SOLN
4.0000 mg | Freq: Four times a day (QID) | INTRAMUSCULAR | Status: DC | PRN
  Administered 2020-07-09 – 2020-07-10 (×2): 4 mg via INTRAVENOUS
  Filled 2020-07-08 (×2): qty 2

## 2020-07-08 MED ORDER — CHLORHEXIDINE GLUCONATE 0.12% ORAL RINSE (MEDLINE KIT)
15.0000 mL | Freq: Two times a day (BID) | OROMUCOSAL | Status: DC
  Administered 2020-07-08: 15 mL via OROMUCOSAL

## 2020-07-08 MED ORDER — ACETAMINOPHEN 500 MG PO TABS
1000.0000 mg | ORAL_TABLET | Freq: Four times a day (QID) | ORAL | Status: DC
  Administered 2020-07-08 – 2020-07-13 (×15): 1000 mg via ORAL
  Filled 2020-07-08 (×14): qty 2

## 2020-07-08 MED ORDER — METOPROLOL TARTRATE 5 MG/5ML IV SOLN: 2.5000 mg | INTRAVENOUS | Status: DC | PRN

## 2020-07-08 MED ORDER — INSULIN REGULAR(HUMAN) IN NACL 100-0.9 UT/100ML-% IV SOLN: INTRAVENOUS | Status: DC

## 2020-07-08 MED ORDER — PROTAMINE SULFATE 10 MG/ML IV SOLN
INTRAVENOUS | Status: DC | PRN
  Administered 2020-07-08: 280 mg via INTRAVENOUS

## 2020-07-08 MED ORDER — MIDAZOLAM HCL 5 MG/5ML IJ SOLN
INTRAMUSCULAR | Status: DC | PRN
  Administered 2020-07-08: 1 mg via INTRAVENOUS
  Administered 2020-07-08: 3 mg via INTRAVENOUS
  Administered 2020-07-08 (×2): 2 mg via INTRAVENOUS
  Administered 2020-07-08: 4 mg via INTRAVENOUS

## 2020-07-08 MED ORDER — SODIUM CHLORIDE 0.9% FLUSH: 3.0000 mL | INTRAVENOUS | Status: DC | PRN

## 2020-07-08 MED ORDER — SODIUM CHLORIDE 0.9 % IV SOLN: 250.0000 mL | INTRAVENOUS | Status: DC

## 2020-07-08 MED ORDER — HEPARIN SODIUM (PORCINE) 1000 UNIT/ML IJ SOLN
INTRAMUSCULAR | Status: DC | PRN
  Administered 2020-07-08: 33000 [IU] via INTRAVENOUS

## 2020-07-08 MED ORDER — FAMOTIDINE IN NACL 20-0.9 MG/50ML-% IV SOLN
20.0000 mg | Freq: Two times a day (BID) | INTRAVENOUS | Status: AC
  Administered 2020-07-08 (×2): 20 mg via INTRAVENOUS
  Filled 2020-07-08 (×3): qty 50

## 2020-07-08 MED ORDER — PROPOFOL 10 MG/ML IV BOLUS
INTRAVENOUS | Status: DC | PRN
Start: 2020-07-08 — End: 2020-07-08
  Administered 2020-07-08: 70 mg via INTRAVENOUS

## 2020-07-08 MED ORDER — PHENYLEPHRINE 40 MCG/ML (10ML) SYRINGE FOR IV PUSH (FOR BLOOD PRESSURE SUPPORT)
PREFILLED_SYRINGE | INTRAVENOUS | Status: AC
  Filled 2020-07-08: qty 10

## 2020-07-08 MED ORDER — BISACODYL 10 MG RE SUPP: 10.0000 mg | Freq: Every day | RECTAL | Status: DC

## 2020-07-08 MED ORDER — MAGNESIUM SULFATE 4 GM/100ML IV SOLN
4.0000 g | Freq: Once | INTRAVENOUS | Status: AC
  Administered 2020-07-08: 4 g via INTRAVENOUS
  Filled 2020-07-08: qty 100

## 2020-07-08 MED ORDER — FENTANYL CITRATE (PF) 250 MCG/5ML IJ SOLN
INTRAMUSCULAR | Status: AC
  Filled 2020-07-08: qty 5

## 2020-07-08 MED ORDER — OXYCODONE HCL 5 MG PO TABS
5.0000 mg | ORAL_TABLET | ORAL | Status: DC | PRN
  Administered 2020-07-08 – 2020-07-09 (×3): 10 mg via ORAL
  Administered 2020-07-11: 5 mg via ORAL
  Filled 2020-07-08: qty 1
  Filled 2020-07-08: qty 2
  Filled 2020-07-08: qty 1
  Filled 2020-07-08 (×2): qty 2

## 2020-07-08 SURGICAL SUPPLY — 78 items
BAG DECANTER FOR FLEXI CONT (MISCELLANEOUS) ×4 IMPLANT
BLADE CLIPPER SURG (BLADE) ×4 IMPLANT
BLADE STERNUM SYSTEM 6 (BLADE) ×4 IMPLANT
BLADE SURG 11 STRL SS (BLADE) ×4 IMPLANT
BNDG ELASTIC 4X5.8 VLCR STR LF (GAUZE/BANDAGES/DRESSINGS) ×4 IMPLANT
BNDG ELASTIC 6X5.8 VLCR STR LF (GAUZE/BANDAGES/DRESSINGS) ×4 IMPLANT
BNDG GAUZE ELAST 4 BULKY (GAUZE/BANDAGES/DRESSINGS) ×4 IMPLANT
CANISTER SUCT 3000ML PPV (MISCELLANEOUS) ×4 IMPLANT
CATH CPB KIT GERHARDT (MISCELLANEOUS) ×4 IMPLANT
CLIP RETRACTION 3.0MM CORONARY (MISCELLANEOUS) ×4 IMPLANT
DERMABOND ADVANCED (GAUZE/BANDAGES/DRESSINGS) ×1
DERMABOND ADVANCED .7 DNX12 (GAUZE/BANDAGES/DRESSINGS) ×3 IMPLANT
DRAIN CHANNEL 28F RND 3/8 FF (WOUND CARE) ×4 IMPLANT
DRAIN JP 10F RND SILICONE (MISCELLANEOUS) ×4 IMPLANT
DRAPE CARDIOVASCULAR INCISE (DRAPES) ×4
DRAPE SLUSH/WARMER DISC (DRAPES) ×4 IMPLANT
DRAPE SRG 135X102X78XABS (DRAPES) ×3 IMPLANT
DRSG AQUACEL AG ADV 3.5X14 (GAUZE/BANDAGES/DRESSINGS) ×4 IMPLANT
ELECT BLADE 4.0 EZ CLEAN MEGAD (MISCELLANEOUS) ×4
ELECT REM PT RETURN 9FT ADLT (ELECTROSURGICAL) ×8
ELECTRODE BLDE 4.0 EZ CLN MEGD (MISCELLANEOUS) ×3 IMPLANT
ELECTRODE REM PT RTRN 9FT ADLT (ELECTROSURGICAL) ×6 IMPLANT
EVACUATOR SILICONE 100CC (DRAIN) ×4 IMPLANT
FELT TEFLON 1X6 (MISCELLANEOUS) ×4 IMPLANT
FILTER SMOKE EVAC ULPA (FILTER) ×4 IMPLANT
GAUZE SPONGE 4X4 12PLY STRL (GAUZE/BANDAGES/DRESSINGS) ×8 IMPLANT
GAUZE SPONGE 4X4 12PLY STRL LF (GAUZE/BANDAGES/DRESSINGS) ×8 IMPLANT
GLOVE BIO SURGEON STRL SZ 6.5 (GLOVE) ×12 IMPLANT
GOWN STRL REUS W/ TWL LRG LVL3 (GOWN DISPOSABLE) ×12 IMPLANT
GOWN STRL REUS W/TWL LRG LVL3 (GOWN DISPOSABLE) ×16
HEMOSTAT POWDER SURGIFOAM 1G (HEMOSTASIS) ×12 IMPLANT
HEMOSTAT SURGICEL 2X14 (HEMOSTASIS) ×4 IMPLANT
KIT BASIN OR (CUSTOM PROCEDURE TRAY) ×4 IMPLANT
KIT CATH SUCT 8FR (CATHETERS) ×4 IMPLANT
KIT SUCTION CATH 14FR (SUCTIONS) ×8 IMPLANT
KIT TURNOVER KIT B (KITS) ×4 IMPLANT
KIT VASOVIEW HEMOPRO 2 VH 4000 (KITS) ×4 IMPLANT
LEAD PACING MYOCARDI (MISCELLANEOUS) ×4 IMPLANT
MARKER GRAFT CORONARY BYPASS (MISCELLANEOUS) ×12 IMPLANT
NS IRRIG 1000ML POUR BTL (IV SOLUTION) ×20 IMPLANT
PACK E OPEN HEART (SUTURE) ×4 IMPLANT
PACK OPEN HEART (CUSTOM PROCEDURE TRAY) ×4 IMPLANT
PAD ELECT DEFIB RADIOL ZOLL (MISCELLANEOUS) ×4 IMPLANT
PENCIL BUTTON HOLSTER BLD 10FT (ELECTRODE) ×4 IMPLANT
PENCIL SMOKE EVACUATOR (MISCELLANEOUS) ×4 IMPLANT
POSITIONER HEAD DONUT 9IN (MISCELLANEOUS) ×4 IMPLANT
PUNCH AORTIC ROTATE 4.5MM 8IN (MISCELLANEOUS) ×4 IMPLANT
SET CARDIOPLEGIA MPS 5001102 (MISCELLANEOUS) ×4 IMPLANT
SLEEVE SUCTION 125 (MISCELLANEOUS) ×4 IMPLANT
SPONGE LAP 18X18 RF (DISPOSABLE) ×12 IMPLANT
STAPLER VISISTAT 35W (STAPLE) ×4 IMPLANT
SURGIFLO W/THROMBIN 8M KIT (HEMOSTASIS) ×4 IMPLANT
SUT BONE WAX W31G (SUTURE) ×4 IMPLANT
SUT MNCRL AB 4-0 PS2 18 (SUTURE) ×12 IMPLANT
SUT PROLENE 3 0 SH1 36 (SUTURE) ×4 IMPLANT
SUT PROLENE 4 0 TF (SUTURE) ×8 IMPLANT
SUT PROLENE 5 0 C 1 36 (SUTURE) ×4 IMPLANT
SUT PROLENE 6 0 C 1 30 (SUTURE) ×4 IMPLANT
SUT PROLENE 6 0 CC (SUTURE) ×16 IMPLANT
SUT PROLENE 7 0 BV 1 (SUTURE) ×4 IMPLANT
SUT PROLENE 7 0 BV1 MDA (SUTURE) ×4 IMPLANT
SUT PROLENE 8 0 BV175 6 (SUTURE) ×24 IMPLANT
SUT STEEL 6MS V (SUTURE) ×4 IMPLANT
SUT STEEL SZ 6 DBL 3X14 BALL (SUTURE) ×4 IMPLANT
SUT VIC AB 1 CTX 18 (SUTURE) ×8 IMPLANT
SUT VIC AB 2-0 CT1 27 (SUTURE) ×12
SUT VIC AB 2-0 CT1 TAPERPNT 27 (SUTURE) ×9 IMPLANT
SYSTEM SAHARA CHEST DRAIN ATS (WOUND CARE) ×4 IMPLANT
TAPE CLOTH SURG 4X10 WHT LF (GAUZE/BANDAGES/DRESSINGS) ×4 IMPLANT
TAPE PAPER 2X10 WHT MICROPORE (GAUZE/BANDAGES/DRESSINGS) ×4 IMPLANT
TOWEL GREEN STERILE (TOWEL DISPOSABLE) ×4 IMPLANT
TOWEL GREEN STERILE FF (TOWEL DISPOSABLE) ×4 IMPLANT
TRAY FOLEY SLVR 16FR TEMP STAT (SET/KITS/TRAYS/PACK) ×4 IMPLANT
TUBE CONNECTING 12X1/4 (SUCTIONS) ×4 IMPLANT
TUBING LAP HI FLOW INSUFFLATIO (TUBING) ×4 IMPLANT
UNDERPAD 30X36 HEAVY ABSORB (UNDERPADS AND DIAPERS) ×4 IMPLANT
WATER STERILE IRR 1000ML POUR (IV SOLUTION) ×8 IMPLANT
YANKAUER SUCT BULB TIP NO VENT (SUCTIONS) ×4 IMPLANT

## 2020-07-08 NOTE — Anesthesia Procedure Notes (Signed)
Arterial Line Insertion Start/End2/01/2021 8:04 AM, 07/08/2020 8:04 AM Performed by: CRNA  Patient location: Pre-op. Preanesthetic checklist: patient identified, IV checked, site marked, risks and benefits discussed, surgical consent, monitors and equipment checked, pre-op evaluation, timeout performed and anesthesia consent Lidocaine 1% used for infiltration Left, radial was placed Catheter size: 20 G Hand hygiene performed , maximum sterile barriers used  and Seldinger technique used Allen's test indicative of satisfactory collateral circulation Attempts: 1 Procedure performed without using ultrasound guided technique. Following insertion, dressing applied and Biopatch. Post procedure assessment: normal  Patient tolerated the procedure well with no immediate complications.

## 2020-07-08 NOTE — Progress Notes (Signed)
      [address]       [place] [address]             [phone]    Patient ID: Leonard Donovan, male   DOB: [birth year], 55 y.o.   MRN: [id number] Pre Procedure note for inpatients:   Leonard Donovan has been scheduled for Procedure(s): CORONARY ARTERY BYPASS GRAFTING (CABG) (N/A) TRANSESOPHAGEAL ECHOCARDIOGRAM (TEE) (N/A) today. The various methods of treatment have been discussed with the patient. After consideration of the risks, benefits and treatment options the patient has consented to the planned procedure.   The patient has been seen and labs reviewed. There are no changes in the patient's condition to prevent proceeding with the planned procedure today.  Recent labs:  Lab Results  Component Value Date   WBC 6.4 07/08/2020   HGB 15.2 07/08/2020   HCT 45.0 07/08/2020   PLT 204 07/08/2020   GLUCOSE 99 07/08/2020   CHOL 149 07/03/2020   TRIG 82 07/03/2020   HDL 34 (L) 07/03/2020   LDLCALC 99 07/03/2020   ALT 19 07/07/2020   AST 26 07/07/2020   NA 138 07/08/2020   K 4.1 07/08/2020   CL 107 07/08/2020   CREATININE 0.99 07/08/2020   BUN 15 07/08/2020   CO2 22 07/08/2020   INR 1.1 07/07/2020   HGBA1C 5.7 (H) 07/02/2020  repeat vascular study of left arm- complete obliteration of palmar arch with radial compression-we will not use left radial artery   Delight Ovens, MD 07/08/2020 7:39 AM

## 2020-07-08 NOTE — Anesthesia Procedure Notes (Signed)
Procedure Name: Intubation Date/Time: 07/08/2020 8:42 AM Performed by: Adria Dill, CRNA Pre-anesthesia Checklist: Patient identified, Emergency Drugs available, Suction available and Patient being monitored Patient Re-evaluated:Patient Re-evaluated prior to induction Oxygen Delivery Method: Circle system utilized Preoxygenation: Pre-oxygenation with 100% oxygen Induction Type: IV induction Ventilation: Mask ventilation without difficulty Laryngoscope Size: [place] and 3 Grade View: Grade I Tube type: Oral Tube size: 8.0 mm Number of attempts: 1 Airway Equipment and Method: Stylet and Oral airway Placement Confirmation: ETT inserted through vocal cords under direct vision,  positive ETCO2 and breath sounds checked- equal and bilateral Secured at: 21 cm Tube secured with: Tape Dental Injury: Teeth and Oropharynx as per pre-operative assessment

## 2020-07-08 NOTE — Anesthesia Postprocedure Evaluation (Signed)
Anesthesia Post Note  Patient: Leonard Donovan  Procedure(s) Performed: CORONARY ARTERY BYPASS GRAFTING (CABG), ON PUMP, TIMES FOUR, LEFT INTERNAL MAMMARY ARTERY TO LAD, RIGHT SVG TO PDA, DISTAL CIRCUMFLEX, AND OM1 (N/A Chest) TRANSESOPHAGEAL ECHOCARDIOGRAM (TEE) (N/A Esophagus) ENDOVEIN HARVEST OF RIGHT GREATER SAPHENOUS VEIN (Right Leg Upper)     Patient location during evaluation: SICU Anesthesia Type: General Level of consciousness: sedated and patient remains intubated per anesthesia plan Pain management: pain level controlled Vital Signs Assessment: post-procedure vital signs reviewed and stable Respiratory status: patient remains intubated per anesthesia plan and patient on ventilator - see flowsheet for VS Cardiovascular status: stable Anesthetic complications: no   No complications documented.  Last Vitals:  Vitals:   07/08/20 1700 07/08/20 1800  BP: (!) 128/92 118/89  Pulse: (!) 59 62  Resp: (!) 21 13  Temp: 36.6 C 37 C  SpO2: 96% 99%    Last Pain:  Vitals:   07/08/20 1800  TempSrc: Core  PainSc:                  Lynasia Meloche COKER

## 2020-07-08 NOTE — Transfer of Care (Signed)
Immediate Anesthesia Transfer of Care Note  Patient: Leonard Donovan  Procedure(s) Performed: CORONARY ARTERY BYPASS GRAFTING (CABG), ON PUMP, TIMES FOUR, LEFT INTERNAL MAMMARY ARTERY TO LAD, RIGHT SVG TO PDA, DISTAL CIRCUMFLEX, AND OM1 (N/A Chest) TRANSESOPHAGEAL ECHOCARDIOGRAM (TEE) (N/A Esophagus) ENDOVEIN HARVEST OF RIGHT GREATER SAPHENOUS VEIN (Right Leg Upper)  Patient Location: SICU  Anesthesia Type:General  Level of Consciousness: Patient remains intubated per anesthesia plan  Airway & Oxygen Therapy: Patient remains intubated per anesthesia plan and Patient placed on Ventilator (see vital sign flow sheet for setting)  Post-op Assessment: Report given to RN and Post -op Vital signs reviewed and stable  Post vital signs: Reviewed and stable  Last Vitals:  Vitals Value Taken Time  BP    Temp    Pulse    Resp    SpO2      Last Pain:  Vitals:   07/08/20 0527  TempSrc: Oral  PainSc:          Complications: No complications documented.

## 2020-07-08 NOTE — Brief Op Note (Addendum)
      [address]       [place] [address]             [phone]     07/08/2020  1:19 PM  PATIENT:  Leonard Donovan  55 y.o. male  PRE-OPERATIVE DIAGNOSIS:  1. S/p NSTEMI 2. CORONARY ARTERY DISEASE  POST-OPERATIVE DIAGNOSIS:  1. S/p NSTEMI 2. CORONARY ARTERY DISEASE  PROCEDURE:   CORONARY ARTERY BYPASS GRAFTING (CABG), ON PUMP, TIMES FOUR, LEFT INTERNAL MAMMARY ARTERY TO LAD, RIGHT SVG TO PD, DISTAL CIRCUMFLEX, AND OM1 (N/A) TRANSESOPHAGEAL ECHOCARDIOGRAM (TEE) (N/A) ENDOVEIN HARVEST OF RIGHT GREATER SAPHENOUS VEIN (Right) JP DRAIN RIGHT THIGH EVH HARVEST TIME: 48 minutes; EVH PREP TIME: 16 minutes  SURGEON:  Surgeon(s) and Role:    Delight Ovens, MD - Primary  PHYSICIAN ASSISTANT: Doree Fudge PA-C  ASSISTANTS: Benay Spice RNFA  ANESTHESIA:   general  EBL: Per anesthesia and perfusion record  DRAINS: Chest tubes placed in the mediastinal and pleural spaces   COUNTS CORRECT:  YES  DICTATION: .Dragon Dictation  PLAN OF CARE: Admit to inpatient   PATIENT DISPOSITION:  ICU - intubated and hemodynamically stable.   Delay start of Pharmacological VTE agent (>24hrs) due to surgical blood loss or risk of bleeding: yes  BASELINE WEIGHT: 95.2 kg

## 2020-07-08 NOTE — Progress Notes (Signed)
Patient ID: Leonard Donovan, male   DOB: 06/13/1965, 55 y.o.   MRN: [id number]  TCTS Evening Rounds:   Hemodynamically stable  CI = 1.9  Extubated and alert  Urine output good  CT output low  CBC    Component Value Date/Time   WBC 8.0 07/08/2020 1511   RBC 4.23 07/08/2020 1511   HGB 12.6 (L) 07/08/2020 1834   HCT 37.0 (L) 07/08/2020 1834   PLT 124 (L) 07/08/2020 1511   MCV 86.1 07/08/2020 1511   MCH 30.3 07/08/2020 1511   MCHC 35.2 07/08/2020 1511   RDW 13.7 07/08/2020 1511   LYMPHSABS 1.9 07/01/2020 1739   MONOABS 0.6 07/01/2020 1739   EOSABS 0.2 07/01/2020 1739   BASOSABS 0.0 07/01/2020 1739     BMET    Component Value Date/Time   NA 141 07/08/2020 1834   K 4.8 07/08/2020 1834   CL 105 07/08/2020 1346   CO2 22 07/08/2020 0354   GLUCOSE 132 (H) 07/08/2020 1346   BUN 14 07/08/2020 1346   CREATININE 0.90 07/08/2020 1346   CALCIUM 9.0 07/08/2020 0354   GFRNONAA >60 07/08/2020 0354     A/P:  Stable postop course. Continue current plans

## 2020-07-08 NOTE — Discharge Summary (Signed)
Physician Discharge Summary       [address]       [place] [address]             [phone]    Patient ID: Leonard Donovan MRN: [id number] DOB/AGE: 02-21-1966 55 y.o.  Admit date: 07/01/2020 Discharge date: 07/13/2020  Admission Diagnoses: 1. Hypertensive emergency 2. NSTEMI (non-ST elevated myocardial infarction) (HCC) 3. Coronary artery disease  Discharge Diagnoses:  1. S/p CABG x 4 2. 40-59% left internal carotid artery stenosis 3. History of hypertension 4. History of glaucoma' 5. History of COVID 6. History of tobacco abuse     Consults: None  Procedure (s):    07/08/2020  1:19 PM  PATIENT:  Leonard Donovan  55 y.o. male  PRE-OPERATIVE DIAGNOSIS:  1. S/p NSTEMI 2. CORONARY ARTERY DISEASE  POST-OPERATIVE DIAGNOSIS:  1. S/p NSTEMI 2. CORONARY ARTERY DISEASE  PROCEDURE:   CORONARY ARTERY BYPASS GRAFTING (CABG), ON PUMP, TIMES FOUR, LEFT INTERNAL MAMMARY ARTERY TO LAD, RIGHT SVG TO PD, DISTAL CIRCUMFLEX, AND OM1 (N/A) TRANSESOPHAGEAL ECHOCARDIOGRAM (TEE) (N/A) ENDOVEIN HARVEST OF RIGHT GREATER SAPHENOUS VEIN (Right) JP DRAIN RIGHT THIGH EVH HARVEST TIME: 48 minutes; EVH PREP TIME: 16 minutes  SURGEON:  Surgeon(s) and Role:    Delight Ovens, MD - Primary  PHYSICIAN ASSISTANT: Doree Fudge PA-C  ASSISTANTS: Benay Spice RNFA  ANESTHESIA:   general  EBL: Per anesthesia and perfusion record  DRAINS: Chest tubes placed in the mediastinal and pleural spaces   COUNTS CORRECT:  YES  DICTATION: .Dragon Dictation  PLAN OF CARE: Admit to inpatient   PATIENT DISPOSITION:  ICU - intubated and hemodynamically stable.   Delay start of Pharmacological VTE agent (>24hrs) due to surgical blood loss or risk of bleeding: yes  BASELINE WEIGHT: 95.2 kg  History of Presenting Illness: This is a is a 55 year old male patient with a past medical history significant for hypertension, glaucoma, and recent Covid infection which  was diagnosed on 06/18/2020.  He has no known history of hyperlipidemia or diabetes.  He is a former smoker but has not smoked in about 30 years.  He does have a positive family history for heart disease with his  sister having known coronary artery disease and ultimately required CABG.  She was first diagnosed with coronary artery disease in her 42s.  He presented on 07/02/2020 to the [facility] emergency department with diffuse chest pain over the past 2 weeks that radiates to bilateral shoulders at times.  His pain has really been occurring for the last two years (since COVID) but it started only with a lot of exertion. He does have associated shortness of breath and numbness in his hands as well as occasional diaphoresis.  The pain is a burning sensation and is an 8 out of 10 at its worst.  The pain had been getting worse over the past 2 weeks and becoming more frequent.  It occurs at both rest and with activity at this point.  He also reports some shortness of breath while lying flat on his back as well as waking up all of a sudden with a feeling of shortness of breath during the night.  Due to his strong family history and symptoms he was taken to the Cath Lab and underwent a cardiac catheterization on 07/02/2020 which showed proximal LAD lesion of 75% stenosis with diffuse disease down the vessel, mid circumflex lesion of 80% with a first OM branch of 75% stenosis, proximal RCA lesion of  80% stenosis with a right PDA branch of 75% stenosis and an additional right PDA branch with 50% stenosis, lastly a right  atrioventriclar artery with 50% stenosis. Recent Echocardiogram shows EF of 60-65% and mild mitral valve regurgitation with no other valvular abnormalities.  We are consulted for possible surgical revascularization.  Leonard Donovan lives in [place] with his wife and three kids ages 64, 44, and 43. 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 He has lead an active lifestyle until he was diagnosed with COVID in  January. He had fevers and shortness of breath with this infection. After about two weeks he began to feel better but his wife continues to worry about post COVID pneumonia. Chest xray was reviewed with the patient and wife at the bedside.  Dr. Tyrone Sage discussed the need for coronary artery bypass grafting surgery. Potential risks, complications, and benefits of the surgery were discussed with the patient and he agreed to proceed with surgery. Pre operative carotid duplex US showed no significant left internal carotid artery stenosis and a 40-59% left internal carotid artery stenosis.  Patient underwent a CABG x 4 by Dr. Tyrone Sage on 02/09/20222.  Brief Hospital Course:   Patient was extubated the evening of surgery. He remained afebrile and hemodynamically stable. Theone Murdoch, a line, chest tubes and foley were removed early in his post operative course. He was initially AAI paced. He was later started on Lopressor and changed to Coreg. His creatinine was elevated post op. It went as high as 1.49. This did normalize to 1.07 on 02/13. He was weaned off the Insulin drip. His pre op HGA1C was 5.7. He will need further surveillance by his medical doctor after discharge. He will be provided nutrition information with his discharge paperwork. Once his creatinine was improved, he was diuresed. He was felt surgically stable for transfer from the ICU to 4E for further convalescence on 02/12. Patient was ambulating on room air with good oxygenation. He has been tolerating a diet and has had a bowel movement. All wounds are clean, dry, and healing without signs of infection. Patient is felt surgically stable for discharge today.    Latest Vital Signs: Blood pressure 128/78, pulse 77, temperature 98.5 F (36.9 C), temperature source Oral, resp. rate 18, height 5\' 9"  (1.753 m), weight 97.2 kg, SpO2 95 %.  Physical Exam:   General appearance: alert, cooperative and no distress Heart: regular rate and rhythm, S1,  S2 normal, no murmur, click, rub or gallop Lungs: clear to auscultation bilaterally Abdomen: soft, non-tender; bowel sounds normal; no masses,  no organomegaly Extremities: extremities normal, atraumatic, no cyanosis or edema Wound: clean and dry   Discharge Condition: Stable and discharged to home.  Recent laboratory studies:  Lab Results  Component Value Date   WBC 8.1 07/12/2020   HGB 10.8 (L) 07/12/2020   HCT 33.1 (L) 07/12/2020   MCV 88.0 07/12/2020   PLT 165 07/12/2020   Lab Results  Component Value Date   NA 138 07/12/2020   K 4.1 07/12/2020   CL 106 07/12/2020   CO2 22 07/12/2020   CREATININE 1.07 07/12/2020   GLUCOSE 100 (H) 07/12/2020      Diagnostic Studies: DG Chest 2 View  Result Date: 07/12/2020 CLINICAL DATA:  Status post CABG. EXAM: CHEST - 2 VIEW COMPARISON:  Chest x-ray from yesterday. FINDINGS: Interval removal of the right internal jugular sheath. Stable cardiomegaly status post CABG. Normal pulmonary vascularity. Slightly improved mild bibasilar atelectasis. Unchanged small bilateral pleural effusions. No  pneumothorax. No acute osseous abnormality. IMPRESSION: 1. Slightly improved bibasilar atelectasis. Unchanged small bilateral pleural effusions. Electronically Signed   By: Obie DredgeWilliam T Derry M.D.   On: 07/12/2020 07:18   DG Chest 2 View  Result Date: 07/07/2020 CLINICAL DATA:  Coronary artery disease. Preoperative coronary artery bypass grafting. Chest pain. EXAM: CHEST - 2 VIEW COMPARISON:  July 01, 2020 FINDINGS: There is slight scarring in the upper lobe regions. There is no edema or airspace opacity. Heart is upper normal in size with pulmonary vascularity normal. No adenopathy. There is degenerative change in the thoracic spine. There is slight anterior wedging of a lower thoracic vertebral body. IMPRESSION: Slight scarring in the upper lobes. No edema or airspace opacity. Heart upper normal in size. Electronically Signed   By: Bretta BangWilliam  Woodruff III M.D.    On: 07/07/2020 08:08   DG Chest 2 View  Result Date: 07/01/2020 CLINICAL DATA:  Chest pain.  Shortness of breath EXAM: CHEST - 2 VIEW COMPARISON:  None. FINDINGS: Heart size is normal. No pleural effusion or edema. Bilateral upper lobe predominant hazy opacities are identified. Subsegmental atelectasis noted in the left base. The visualized osseous structures are unremarkable. IMPRESSION: Bilateral upper lobe predominant hazy opacities concerning for multifocal infection. Electronically Signed   By: Signa Kellaylor  Stroud M.D.   On: 07/01/2020 18:01   CARDIAC CATHETERIZATION  Result Date: 07/02/2020  1st Mrg lesion is 75% stenosed.  Mid Cx lesion is 80% stenosed.  Prox RCA lesion is 80% stenosed.  Mid LAD lesion is 50% stenosed.  Dist LAD lesion is 75% stenosed.  RPDA-1 lesion is 75% stenosed.  RPDA-2 lesion is 50% stenosed.  RPAV lesion is 50% stenosed.  Prox LAD to Mid LAD lesion is 75% stenosed.  The left ventricular systolic function is normal.  LV end diastolic pressure is normal.  The left ventricular ejection fraction is 55-65% by visual estimate.  There is no aortic valve stenosis.  Diffuse three vessel disease, particularly throughout the LAD.  Plan for cardiac surgery consult.  Distal small vessel disease may affect quality of targets.  I stressed the importance of risk factor modification with the patient including lipid lowering therapy and BP control. Results discussed with his wife, Albin FellingCarla.   DG Chest Port 1 View  Result Date: 07/11/2020 CLINICAL DATA:  55 year old male postoperative day 3 status post CABG. EXAM: PORTABLE CHEST 1 VIEW COMPARISON:  Portable chest 07/10/2020 and earlier. FINDINGS: Portable AP semi upright view at 0616 hours. Right IJ introducer sheath remains. Low but improved lung volumes from yesterday. Left chest tube removed. No pneumothorax. No pulmonary edema. Patchy bilateral lung base opacity mildly improved. Stable cardiac size and mediastinal contours. Sequelae  of CABG. Negative visible bowel gas. Stable visualized osseous structures. IMPRESSION: 1. Left chest tube removed. No pneumothorax. 2. Low but improved lung volumes from yesterday. Bilateral atelectasis with probable small pleural effusions. Electronically Signed   By: Odessa FlemingH  Hall M.D.   On: 07/11/2020 08:55   DG Chest Port 1 View  Result Date: 07/10/2020 CLINICAL DATA:  Chest pain EXAM: PORTABLE CHEST 1 VIEW COMPARISON:  July 09, 2020 FINDINGS: Swan-Ganz catheter has been removed. Cordis tip is in the superior vena cava. There is an apparent kink in the Cordis catheter at the cervicothoracic junction level. Chest tube on the left is unchanged in position. No pneumothorax. There is atelectatic change in the lung bases, somewhat more on the left than the right. Lungs elsewhere clear. There is cardiomegaly with pulmonary vascularity normal. No adenopathy.  No bone lesions. IMPRESSION: Tube and catheter positions as described without pneumothorax. Apparent kink in Cordis catheter at the right cervicothoracic junction level. There is bibasilar atelectasis. No new opacity evident. Stable cardiac prominence. Electronically Signed   By: Bretta Bang III M.D.   On: 07/10/2020 07:54   DG Chest Port 1 View  Result Date: 07/09/2020 CLINICAL DATA:  Chest pain.  Status postextubation EXAM: PORTABLE CHEST 1 VIEW COMPARISON:  July 08, 2020 FINDINGS: Endotracheal tube and nasogastric tube have been removed. Swan-Ganz catheter tip is in the right main pulmonary artery. There is a chest tube on the left. Temporary pacemaker wires are attached to the right heart. No pneumothorax. There is atelectatic change in the left lower lobe with equivocal left pleural effusion. There is mild atelectasis in the right base. Heart is mildly enlarged with pulmonary vascularity normal, stable. Patient is status post coronary artery bypass grafting. No adenopathy. No bone lesions. IMPRESSION: Tube and catheter positions as described  without pneumothorax. Bibasilar atelectasis, more notable on the left than on the right. Small left pleural effusion. Stable cardiac prominence. Electronically Signed   By: Bretta Bang III M.D.   On: 07/09/2020 07:59   DG Chest Port 1 View  Result Date: 07/08/2020 CLINICAL DATA:  Pneumothorax EXAM: PORTABLE CHEST 1 VIEW COMPARISON:  Chest radiograph from one day prior. FINDINGS: Endotracheal tube tip is 3.6 cm above the carina. Enteric tube terminates in the gastric fundus. Intact sternotomy wires. Right internal jugular central venous catheter terminates in the right pulmonary artery. Left chest tube in place. Stable cardiomediastinal silhouette with top-normal heart size. No pneumothorax. No pleural effusion. Low lung volumes. No overt pulmonary edema. Mild bibasilar atelectasis. IMPRESSION: 1. No pneumothorax.  Well-positioned support structures. 2. Low lung volumes with mild bibasilar atelectasis. Electronically Signed   By: Delbert Phenix M.D.   On: 07/08/2020 15:15   ECHOCARDIOGRAM COMPLETE  Result Date: 07/02/2020    ECHOCARDIOGRAM REPORT   Patient Name:   Leonard Donovan Date of Exam: 07/02/2020 Medical Rec #:  [id number]      Height:       69.0 in Accession #:    [id number]     Weight:       218.1 lb Date of Birth:  1965/08/14     BSA:          2.143 m Patient Age:    54 years       BP:           189/108 mmHg Patient Gender: M              HR:           75 bpm. Exam Location:  Inpatient Procedure: 2D Echo, Cardiac Doppler and Color Doppler Indications:    NSTEMI I21.4  History:        Patient has no prior history of Echocardiogram examinations.                 Risk Factors:Hypertension.  Sonographer:    Eulah Pont RDCS Referring Phys: [id number] CALLIE E GOODRICH IMPRESSIONS  1. Left ventricular ejection fraction, by estimation, is 60 to 65%. The left ventricle has normal function. The left ventricle has no regional wall motion abnormalities. Left ventricular diastolic parameters are consistent with  Grade II diastolic dysfunction (pseudonormalization). Elevated left atrial pressure.  2. Right ventricular systolic function is normal. The right ventricular size is normal.  3. The mitral valve is grossly normal. Mild mitral valve regurgitation.  4.  The aortic valve is normal in structure. Aortic valve regurgitation is not visualized. FINDINGS  Left Ventricle: Left ventricular ejection fraction, by estimation, is 60 to 65%. The left ventricle has normal function. The left ventricle has no regional wall motion abnormalities. The left ventricular internal cavity size was normal in size. There is  no left ventricular hypertrophy. Left ventricular diastolic parameters are consistent with Grade II diastolic dysfunction (pseudonormalization). Elevated left atrial pressure. Right Ventricle: The right ventricular size is normal. Right vetricular wall thickness was not assessed. Right ventricular systolic function is normal. Left Atrium: Left atrial size was normal in size. Right Atrium: Right atrial size was normal in size. Pericardium: There is no evidence of pericardial effusion. Mitral Valve: The mitral valve is grossly normal. Mild mitral valve regurgitation. Tricuspid Valve: The tricuspid valve is normal in structure. Tricuspid valve regurgitation is mild. Aortic Valve: The aortic valve is normal in structure. Aortic valve regurgitation is not visualized. Pulmonic Valve: The pulmonic valve was normal in structure. Pulmonic valve regurgitation is not visualized. Aorta: The aortic root and ascending aorta are structurally normal, with no evidence of dilitation. IAS/Shunts: The interatrial septum was not assessed.  LEFT VENTRICLE PLAX 2D LVIDd:         4.70 cm  Diastology LVIDs:         3.10 cm  LV e' medial:    4.35 cm/s LV PW:         1.00 cm  LV E/e' medial:  20.2 LV IVS:        1.10 cm  LV e' lateral:   3.26 cm/s LVOT diam:     2.10 cm  LV E/e' lateral: 26.9 LV SV:         85 LV SV Index:   40 LVOT Area:     3.46 cm   RIGHT VENTRICLE RV S prime:     18.50 cm/s TAPSE (M-mode): 2.1 cm LEFT ATRIUM             Index       RIGHT ATRIUM           Index LA diam:        3.50 cm 1.63 cm/m  RA Area:     10.20 cm LA Vol (A2C):   52.7 ml 24.59 ml/m RA Volume:   19.70 ml  9.19 ml/m LA Vol (A4C):   51.1 ml 23.84 ml/m LA Biplane Vol: 54.6 ml 25.47 ml/m  AORTIC VALVE LVOT Vmax:   123.00 cm/s LVOT Vmean:  89.800 cm/s LVOT VTI:    0.245 m  AORTA Ao Root diam: 3.20 cm Ao Asc diam:  3.20 cm MITRAL VALVE MV Area (PHT): 2.66 cm     SHUNTS MV Decel Time: 285 msec     Systemic VTI:  0.24 m MV E velocity: 87.80 cm/s   Systemic Diam: 2.10 cm MV A velocity: 119.00 cm/s MV E/A ratio:  0.74 Dietrich Pates MD Electronically signed by Dietrich Pates MD Signature Date/Time: 07/02/2020/8:02:07 PM    Final    ECHO INTRAOPERATIVE TEE  Result Date: 07/08/2020  *INTRAOPERATIVE TRANSESOPHAGEAL REPORT *  Patient Name:   Leonard Donovan Date of Exam: 07/08/2020 Medical Rec #:  [id number]      Height:       69.0 in Accession #:    [id number]     Weight:       209.8 lb Date of Birth:  Oct 25, 1965     BSA:  2.11 m Patient Age:    54 years       BP:           150/83 mmHg Patient Gender: M              HR:           51 bpm. Exam Location:  Anesthesiology Transesophogeal exam was perform intraoperatively during surgical procedure. Patient was closely monitored under general anesthesia during the entirety of examination. Indications:     CAD Native Vessel i25.10 Sonographer:     Irving Burton Senior RDCS Performing Phys: [id number] Gwenith Daily [id number] Diagnosing Phys: Kipp Brood MD Complications: No known complications during this procedure. PRE-OP FINDINGS  Left Ventricle: Marland Kitchen There was normal LV systolic function with the ejection fraction estimated at 55-60% using the 3DQ volumetric package. There were no regional wall motion abnormalities. There was moderate concentric left ventricular hypertrophy with the LV wall thickness 1.15-1.20 cm. On the post-bypass exam, the LV systolic  function was normal and unchanged from the pre-bypass exam. Right Ventricle: The right ventricle has normal systolic function. The cavity was normal. There is no increase in right ventricular wall thickness. On the post-bypass exam, the RV systolic function was normal and unchanged from the pre-bypass study. Left Atrium: Left atrial size was dilated. The left atrial appendage is well visualized and there is evidence of thrombus present. Left atrial appendage velocity is normal at greater than 40 cm/s. Right Atrium: Right atrial size was dilated. Interatrial Septum: No atrial level shunt detected by color flow Doppler. Pericardium: There is no evidence of pericardial effusion. Mitral Valve: The mitral valve is normal in structure. Mild thickening of the mitral valve leaflet. Mitral valve regurgitation is trivial by color flow Doppler. There is No evidence of mitral stenosis. Tricuspid Valve: The tricuspid valve was normal in structure. Tricuspid valve regurgitation is trivial by color flow Doppler. Aortic Valve: The aortic valve is tricuspid Aortic valve regurgitation was not visualized by color flow Doppler. There is no evidence of aortic valve stenosis. There is no evidence of a vegetation on the aortic valve. Pulmonic Valve: The pulmonic valve was normal in structure, with normal. No evidence of pumonic stenosis. Pulmonic valve regurgitation is trivial by color flow Doppler. Aorta: The ascending aorta was normal in diameter with a well defined aortic root and sinotubular junction without dilataion or effacement. The aortic root at the sinuses of valsalva was 2.98 cm The STJ was 2.91 cm. The proximal ascending aorta was 2.93 cm. There was normal wall thickness of the ascending aorta. The descending aorta was normal in diameter and measured . There was grade 3-4 atherosclerotic plaque within the descending aorta was 2.03 cm in diameter. +--------------+-------++ LEFT VENTRICLE        +--------------+-------++  PLAX 2D               +------------+---------++ +--------------+-------++ 3D Volume EF          LVIDd:        4.80 cm +------------+---------++ +--------------+-------++ LV 3D EDV:  126.87 ml LVIDs:        3.70 cm +------------+---------++ +--------------+-------++ LV 3D ESV:  50.92 ml  LV PW:        1.60 cm +------------+---------++ +--------------+-------++ LV IVS:       0.80 cm +--------------+-------++ LV SV:        49 ml   +--------------+-------++ LV SV Index:  22.66   +--------------+-------++                       +--------------+-------++ +--------------+----------++  MITRAL VALVE             +--------------+----------++ MV Area (PHT):2.96 cm   +--------------+----------++ MV PHT:       74.24 msec +--------------+----------++ MV Decel Time:256 msec   +--------------+----------++ +--------------+----------++ MV E velocity:78.90 cm/s +--------------+----------++ MV A velocity:46.70 cm/s +--------------+----------++ MV E/A ratio: 1.69       +--------------+----------++  Kipp Brood MD Electronically signed by Kipp Brood MD Signature Date/Time: 07/08/2020/6:45:51 PM    Final    VAS US DOPPLER PRE CABG  Result Date: 07/03/2020 PREOPERATIVE VASCULAR EVALUATION  Indications:      Pre-CABG. Risk Factors:     Hypertension, past history of smoking. Comparison Study: No previous Performing Technologist: Clint Guy RVT  Examination Guidelines: A complete evaluation includes B-mode imaging, spectral Doppler, color Doppler, and power Doppler as needed of all accessible portions of each vessel. Bilateral testing is considered an integral part of a complete examination. Limited examinations for reoccurring indications may be performed as noted.  Right Carotid Findings: +----------+--------+--------+--------+-----------+----------------------------+           PSV cm/sEDV cm/sStenosisDescribe   Comments                      +----------+--------+--------+--------+-----------+----------------------------+ CCA Prox  90      16                                                      +----------+--------+--------+--------+-----------+----------------------------+ CCA Distal99      26                                                      +----------+--------+--------+--------+-----------+----------------------------+ ICA Prox  125     45      40-59%  homogeneousplaque appears to be                                                      aprroximately 50%            +----------+--------+--------+--------+-----------+----------------------------+ ICA Distal77      25                                                      +----------+--------+--------+--------+-----------+----------------------------+ ECA       91      15                                                      +----------+--------+--------+--------+-----------+----------------------------+ Portions of this table do not appear on this page. +----------+--------+-------+--------+------------+           PSV cm/sEDV cmsDescribeArm Pressure +----------+--------+-------+--------+------------+ Subclavian199                                 +----------+--------+-------+--------+------------+ +---------+--------+--+--------+--+  VertebralPSV cm/s39EDV cm/s14 +---------+--------+--+--------+--+ Left Carotid Findings: +----------+--------+--------+--------+------------+--------+           PSV cm/sEDV cm/sStenosisDescribe    Comments +----------+--------+--------+--------+------------+--------+ CCA Prox  118     22                                   +----------+--------+--------+--------+------------+--------+ CCA Distal129     35                                   +----------+--------+--------+--------+------------+--------+ ICA Prox  77      28      1-39%   heterogenous          +----------+--------+--------+--------+------------+--------+ ICA Distal86      36                                   +----------+--------+--------+--------+------------+--------+ ECA       129     19                                   +----------+--------+--------+--------+------------+--------+ +----------+--------+--------+--------+------------+ SubclavianPSV cm/sEDV cm/sDescribeArm Pressure +----------+--------+--------+--------+------------+           147                                  +----------+--------+--------+--------+------------+ +---------+--------+--+--------+--+ VertebralPSV cm/s41EDV cm/s14 +---------+--------+--+--------+--+  ABI Findings: +--------+------------------+-----+---------+--------+ Right   Rt Pressure (mmHg)IndexWaveform Comment  +--------+------------------+-----+---------+--------+ Brachial                       triphasic85 cm/s  +--------+------------------+-----+---------+--------+ PTA                            triphasic91cm/s   +--------+------------------+-----+---------+--------+ DP                             triphasic78cm/s   +--------+------------------+-----+---------+--------+ +--------+------------------+-----+---------+-------+ Left    Lt Pressure (mmHg)IndexWaveform Comment +--------+------------------+-----+---------+-------+ Brachial                       triphasic94 cm/s +--------+------------------+-----+---------+-------+ PTA                            triphasic100cm/s +--------+------------------+-----+---------+-------+ DP                             triphasic77cm/s  +--------+------------------+-----+---------+-------+  Right Doppler Findings: +-----------+--------+-----+---------+--------+ Site       PressureIndexDoppler  Comments +-----------+--------+-----+---------+--------+ Brachial                triphasic85 cm/s  +-----------+--------+-----+---------+--------+  Radial                  triphasic54 cm/2  +-----------+--------+-----+---------+--------+ Ulnar                   triphasic30 cm/s  +-----------+--------+-----+---------+--------+ Palmar Arch             triphasic         +-----------+--------+-----+---------+--------+  Left Doppler  Findings: +-----------+--------+-----+---------+--------+ Site       PressureIndexDoppler  Comments +-----------+--------+-----+---------+--------+ Brachial                triphasic94 cm/s  +-----------+--------+-----+---------+--------+ Radial                  triphasic74 cm/s  +-----------+--------+-----+---------+--------+ Ulnar                   triphasic64 cm/s  +-----------+--------+-----+---------+--------+ Palmar Arch             triphasic         +-----------+--------+-----+---------+--------+  Summary: Right Carotid: Velocities in the right ICA are consistent with a 40-59%                stenosis. Left Carotid: Velocities in the left ICA are consistent with a 1-39% stenosis. Vertebrals:  Bilateral vertebral arteries demonstrate antegrade flow. Subclavians: Normal flow hemodynamics were seen in bilateral subclavian              arteries. Right ABI: Normal triphasic waveforms. Left ABI: Normal Triphasic waveforms. Right Upper Extremity: Doppler waveforms remain within normal limits with right radial compression. Doppler waveform obliterate with right ulnar compression. Left Upper Extremity: Doppler waveform obliterate with left radial compression. Doppler waveform obliterate with left ulnar compression.  Electronically signed by Sherald Hess MD on 07/03/2020 at 4:26:40 PM.    Final    Discharge Instructions    Amb Referral to Cardiac Rehabilitation   Complete by: As directed    To [place] [facility]   Diagnosis:  CABG NSTEMI     CABG X ___: 4   After initial evaluation and assessments completed: Virtual Based Care may be provided alone or in conjunction with Phase 2 Cardiac  Rehab based on patient barriers.: Yes      Discharge Medications: Allergies as of 07/13/2020   No Known Allergies     Medication List    STOP taking these medications   guaiFENesin 600 MG 12 hr tablet Commonly known as: MUCINEX     TAKE these medications   aspirin 81 MG EC tablet Take 1 tablet (81 mg total) by mouth daily. Swallow whole.   atorvastatin 80 MG tablet Commonly known as: LIPITOR Take 1 tablet (80 mg total) by mouth daily.   carvedilol 6.25 MG tablet Commonly known as: COREG Take 1 tablet (6.25 mg total) by mouth 2 (two) times daily with a meal.   cholecalciferol 25 MCG (1000 UNIT) tablet Commonly known as: VITAMIN D3 Take 1,000 Units by mouth daily.   clopidogrel 75 MG tablet Commonly known as: PLAVIX Take 1 tablet (75 mg total) by mouth daily.   losartan 25 MG tablet Commonly known as: COZAAR Take 1 tablet (25 mg total) by mouth every evening.   multivitamin with minerals tablet Take 1 tablet by mouth daily.   oxyCODONE 5 MG immediate release tablet Commonly known as: Oxy IR/ROXICODONE Take 1 tablet (5 mg total) by mouth every 6 (six) hours as needed for severe pain.   vitamin B-12 1000 MCG tablet Commonly known as: CYANOCOBALAMIN Take 1,000 mcg by mouth daily.   vitamin C 1000 MG tablet Take 1,000 mg by mouth daily.   zinc gluconate 50 MG tablet Take 50 mg by mouth daily.      The patient has been discharged on:   1.Beta Blocker:  Yes [ x  ]  No   [   ]                              If No, reason:  2.Ace Inhibitor/ARB: Yes [ x  ]                                     No  [    ]                                     If No, reason:   3.Statin:   Yes [  x ]                  No  [   ]                  If No, reason:  4.Ecasa:  Yes  [  x ]                  No   [   ]                  If No, reason:  Follow Up Appointments:  Follow-up Information    Delight Ovens, MD. Go on 08/13/2020.   Specialty:  Cardiothoracic Surgery Why: PA/LAT CXR to be taken (at [facility] which is in the same building as Dr. Dennie Maizes office); Appointment time is at 9:30 am Contact information: [address] [employer] [address] [place] [address] [phone]        Azalee Course, [place]. Go on 07/29/2020.   Specialties: Cardiology, Radiology Why: Appointment time is at 10:45 am Contact information: [address] [place] [address] [phone]        [facility] Follow up on 07/20/2020.   Specialty: Cardiology Why: have labs drawn next Monday at [facility] street office.  you may eat priro to labs.  go in the morning. Contact information: [address] [place] [address] [phone]              Signed: Bernadette Hoit [facility] 07/13/2020, 2:01 PM

## 2020-07-08 NOTE — Anesthesia Preprocedure Evaluation (Signed)
Anesthesia Evaluation  Patient identified by MRN, date of birth, ID band Patient awake    Reviewed: Allergy & Precautions, NPO status , Patient's Chart, lab work & pertinent test results  Airway Mallampati: II  TM Distance: >3 FB Neck ROM: Full    Dental  (+) Teeth Intact, Dental Advisory Given   Pulmonary former smoker,    breath sounds clear to auscultation       Cardiovascular hypertension,  Rhythm:Regular Rate:Normal     Neuro/Psych    GI/Hepatic   Endo/Other    Renal/GU      Musculoskeletal   Abdominal   Peds  Hematology   Anesthesia Other Findings   Reproductive/Obstetrics                             Anesthesia Physical Anesthesia Plan  ASA: III  Anesthesia Plan: General   Post-op Pain Management:    Induction: Intravenous  PONV Risk Score and Plan: Ondansetron and Dexamethasone  Airway Management Planned: Oral ETT  Additional Equipment:   Intra-op Plan:   Post-operative Plan: Post-operative intubation/ventilation  Informed Consent: I have reviewed the patients History and Physical, chart, labs and discussed the procedure including the risks, benefits and alternatives for the proposed anesthesia with the patient or authorized representative who has indicated his/her understanding and acceptance.     Dental advisory given  Plan Discussed with: CRNA and Anesthesiologist  Anesthesia Plan Comments:         Anesthesia Quick Evaluation

## 2020-07-08 NOTE — Anesthesia Procedure Notes (Signed)
Central Venous Catheter Insertion Performed by: Kipp Brood, MD, anesthesiologist Start/End2/01/2021 7:40 AM, 07/08/2020 7:45 AM Patient location: Pre-op. Preanesthetic checklist: patient identified, IV checked, site marked, risks and benefits discussed, surgical consent, monitors and equipment checked, pre-op evaluation, timeout performed and anesthesia consent Hand hygiene performed  and maximum sterile barriers used  PA cath was placed.Swan type:thermodilution Procedure performed without using ultrasound guided technique. Attempts: 1 Following insertion, line sutured, dressing applied and Biopatch. Post procedure assessment: blood return through all ports and free fluid flow  Patient tolerated the procedure well with no immediate complications.

## 2020-07-08 NOTE — Procedures (Signed)
Extubation Procedure Note  Patient Details:   Name: MAYJOR AGER DOB: 03-Dec-1965 MRN: [id number]   Airway Documentation:    Vent end date: 07/08/20 Vent end time: 1838   Evaluation  O2 sats: stable throughout Complications: No apparent complications Patient did tolerate procedure well. Bilateral Breath Sounds: Clear,Diminished   Yes, pt could speak post extubation.  Pt extubated to 4 l/m [place] per protocol.  Audrie Lia 07/08/2020, 6:40 PM

## 2020-07-08 NOTE — Anesthesia Procedure Notes (Signed)
Central Venous Catheter Insertion Performed by: Kipp Brood, MD, anesthesiologist Start/End2/01/2021 7:35 AM, 07/08/2020 7:45 AM Patient location: Pre-op. Preanesthetic checklist: patient identified, IV checked, site marked, risks and benefits discussed, surgical consent, monitors and equipment checked, pre-op evaluation, timeout performed and anesthesia consent Lidocaine 1% used for infiltration and patient sedated Hand hygiene performed  and maximum sterile barriers used  Catheter size: 8.5 Fr Sheath introducer Procedure performed using ultrasound guided technique. Ultrasound Notes:anatomy identified, needle tip was noted to be adjacent to the nerve/plexus identified, no ultrasound evidence of intravascular and/or intraneural injection and image(s) printed for medical record Attempts: 1 Following insertion, line sutured and dressing applied. Post procedure assessment: blood return through all ports, free fluid flow and no air  Patient tolerated the procedure well with no immediate complications.

## 2020-07-09 ENCOUNTER — Inpatient Hospital Stay (HOSPITAL_COMMUNITY): Payer: BC Managed Care – PPO

## 2020-07-09 ENCOUNTER — Encounter (HOSPITAL_COMMUNITY): Payer: Self-pay | Admitting: Cardiothoracic Surgery

## 2020-07-09 LAB — BASIC METABOLIC PANEL
Anion gap: 9 (ref 5–15)
Anion gap: 9 (ref 5–15)
BUN: 15 mg/dL (ref 6–20)
BUN: 21 mg/dL — ABNORMAL HIGH (ref 6–20)
CO2: 20 mmol/L — ABNORMAL LOW (ref 22–32)
CO2: 23 mmol/L (ref 22–32)
Calcium: 8.2 mg/dL — ABNORMAL LOW (ref 8.9–10.3)
Calcium: 8.3 mg/dL — ABNORMAL LOW (ref 8.9–10.3)
Chloride: 109 mmol/L (ref 98–111)
Chloride: 104 mmol/L (ref 98–111)
Creatinine, Ser: 0.87 mg/dL (ref 0.61–1.24)
Creatinine, Ser: 1.46 mg/dL — ABNORMAL HIGH (ref 0.61–1.24)
GFR, Estimated: >60 mL/min (ref >60)
GFR, Estimated: 58 mL/min — ABNORMAL LOW (ref >60)
Glucose, Bld: 126 mg/dL — ABNORMAL HIGH (ref 70–99)
Glucose, Bld: 144 mg/dL — ABNORMAL HIGH (ref 70–99)
Potassium: 4.3 mmol/L (ref 3.5–5.1)
Potassium: 4.3 mmol/L (ref 3.5–5.1)
Sodium: 138 mmol/L (ref 135–145)
Sodium: 136 mmol/L (ref 135–145)

## 2020-07-09 LAB — GLUCOSE, CAPILLARY
Glucose-Capillary: 121 mg/dL — ABNORMAL HIGH (ref 70–99)
Glucose-Capillary: 129 mg/dL — ABNORMAL HIGH (ref 70–99)
Glucose-Capillary: 130 mg/dL — ABNORMAL HIGH (ref 70–99)
Glucose-Capillary: 113 mg/dL — ABNORMAL HIGH (ref 70–99)
Glucose-Capillary: 124 mg/dL — ABNORMAL HIGH (ref 70–99)
Glucose-Capillary: 122 mg/dL — ABNORMAL HIGH (ref 70–99)
Glucose-Capillary: 118 mg/dL — ABNORMAL HIGH (ref 70–99)
Glucose-Capillary: 146 mg/dL — ABNORMAL HIGH (ref 70–99)
Glucose-Capillary: 143 mg/dL — ABNORMAL HIGH (ref 70–99)
Glucose-Capillary: 139 mg/dL — ABNORMAL HIGH (ref 70–99)
Glucose-Capillary: 112 mg/dL — ABNORMAL HIGH (ref 70–99)

## 2020-07-09 LAB — MAGNESIUM
Magnesium: 2.5 mg/dL — ABNORMAL HIGH (ref 1.7–2.4)
Magnesium: 2.5 mg/dL — ABNORMAL HIGH (ref 1.7–2.4)

## 2020-07-09 LAB — CBC
HCT: 36.2 % — ABNORMAL LOW (ref 39.0–52.0)
HCT: 38.4 % — ABNORMAL LOW (ref 39.0–52.0)
Hemoglobin: 12 g/dL — ABNORMAL LOW (ref 13.0–17.0)
Hemoglobin: 12.3 g/dL — ABNORMAL LOW (ref 13.0–17.0)
MCH: 29.1 pg (ref 26.0–34.0)
MCH: 28.5 pg (ref 26.0–34.0)
MCHC: 33.1 g/dL (ref 30.0–36.0)
MCHC: 32 g/dL (ref 30.0–36.0)
MCV: 87.7 fL (ref 80.0–100.0)
MCV: 89.1 fL (ref 80.0–100.0)
Platelets: 132 10*3/uL — ABNORMAL LOW (ref 150–400)
Platelets: 182 10*3/uL (ref 150–400)
RBC: 4.13 MIL/uL — ABNORMAL LOW (ref 4.22–5.81)
RBC: 4.31 MIL/uL (ref 4.22–5.81)
RDW: 13.5 % (ref 11.5–15.5)
RDW: 13.8 % (ref 11.5–15.5)
WBC: 8.3 10*3/uL (ref 4.0–10.5)
WBC: 14.9 10*3/uL — ABNORMAL HIGH (ref 4.0–10.5)
nRBC: 0 % (ref 0.0–0.2)
nRBC: 0 % (ref 0.0–0.2)

## 2020-07-09 MED ORDER — ORAL CARE MOUTH RINSE
15.0000 mL | OROMUCOSAL | Status: DC
  Administered 2020-07-09 (×2): 15 mL via OROMUCOSAL

## 2020-07-09 MED ORDER — CARVEDILOL 12.5 MG PO TABS
12.5000 mg | ORAL_TABLET | Freq: Two times a day (BID) | ORAL | Status: DC
  Administered 2020-07-09: 12.5 mg via ORAL
  Filled 2020-07-09: qty 1

## 2020-07-09 MED ORDER — INSULIN ASPART 100 UNIT/ML ~~LOC~~ SOLN
0.0000 [IU] | SUBCUTANEOUS | Status: DC
  Administered 2020-07-09 – 2020-07-10 (×5): 2 [IU] via SUBCUTANEOUS

## 2020-07-09 MED ORDER — ASPIRIN 81 MG PO CHEW: 81.0000 mg | CHEWABLE_TABLET | Freq: Every day | ORAL | Status: DC

## 2020-07-09 MED ORDER — ENOXAPARIN SODIUM 40 MG/0.4ML ~~LOC~~ SOLN
40.0000 mg | Freq: Every day | SUBCUTANEOUS | Status: DC
  Administered 2020-07-09 – 2020-07-10 (×2): 40 mg via SUBCUTANEOUS
  Filled 2020-07-09 (×2): qty 0.4

## 2020-07-09 MED ORDER — KETOROLAC TROMETHAMINE 15 MG/ML IJ SOLN
15.0000 mg | Freq: Four times a day (QID) | INTRAMUSCULAR | Status: AC | PRN
  Administered 2020-07-09 – 2020-07-11 (×3): 15 mg via INTRAVENOUS
  Filled 2020-07-09 (×3): qty 1

## 2020-07-09 MED ORDER — LISINOPRIL 10 MG PO TABS
10.0000 mg | ORAL_TABLET | Freq: Every day | ORAL | Status: DC
  Administered 2020-07-09: 10 mg via ORAL
  Filled 2020-07-09: qty 1

## 2020-07-09 MED ORDER — FUROSEMIDE 10 MG/ML IJ SOLN
40.0000 mg | Freq: Once | INTRAMUSCULAR | Status: AC
  Administered 2020-07-09: 40 mg via INTRAVENOUS
  Filled 2020-07-09: qty 4

## 2020-07-09 MED ORDER — CLOPIDOGREL BISULFATE 75 MG PO TABS
75.0000 mg | ORAL_TABLET | Freq: Every day | ORAL | Status: DC
  Administered 2020-07-09 – 2020-07-13 (×5): 75 mg via ORAL
  Filled 2020-07-09 (×5): qty 1

## 2020-07-09 MED ORDER — ASPIRIN EC 81 MG PO TBEC
81.0000 mg | DELAYED_RELEASE_TABLET | Freq: Every day | ORAL | Status: DC
  Administered 2020-07-09 – 2020-07-13 (×5): 81 mg via ORAL
  Filled 2020-07-09 (×5): qty 1

## 2020-07-09 MED ORDER — ORAL CARE MOUTH RINSE
15.0000 mL | Freq: Two times a day (BID) | OROMUCOSAL | Status: DC
  Administered 2020-07-09 – 2020-07-10 (×3): 15 mL via OROMUCOSAL

## 2020-07-09 MED FILL — Potassium Chloride Inj 2 mEq/ML: INTRAVENOUS | Qty: 40 | Status: AC

## 2020-07-09 MED FILL — Heparin Sodium (Porcine) Inj 1000 Unit/ML: INTRAMUSCULAR | Qty: 30 | Status: AC

## 2020-07-09 MED FILL — Magnesium Sulfate Inj 50%: INTRAMUSCULAR | Qty: 10 | Status: AC

## 2020-07-09 NOTE — Progress Notes (Signed)
TCTS Evening Rounds  POD #1 s/p CABG No complaints BP and HR slightly lower after coreg sCr increased  PE:  BP 110/74   Pulse 61   Temp (!) 97.4 F (36.3 C) (Axillary)   Resp (!) 27   Ht 5\' 9"  (1.753 m)   Wt 99.2 kg   SpO2 94%   BMI 32.30 kg/m  A/o CTA RRR Dressing dry   Intake/Output Summary (Last 24 hours) at 07/09/2020 1732 Last data filed at 07/09/2020 1500 Gross per 24 hour  Intake 1009.37 ml  Output 2189 ml  Net -1179.63 ml    A/p: doing well Atrially pace overnight Repeat labs in am  Coraleigh Sheeran Z. [id number], MD [phone]

## 2020-07-09 NOTE — Addendum Note (Signed)
Addendum  created 07/09/20 1007 by Kipp Brood, MD   Clinical Note Signed

## 2020-07-09 NOTE — Progress Notes (Signed)
Patient ID: Leonard Donovan, male   DOB: 1965-10-03, 55 y.o.   MRN: [id number] TCTS DAILY ICU PROGRESS NOTE                   [address]            [place] [address]          [phone]   1 Day Post-Op Procedure(s) (LRB): CORONARY ARTERY BYPASS GRAFTING (CABG), ON PUMP, TIMES FOUR, LEFT INTERNAL MAMMARY ARTERY TO LAD, RIGHT SVG TO PDA, DISTAL CIRCUMFLEX, AND OM1 (N/A) TRANSESOPHAGEAL ECHOCARDIOGRAM (TEE) (N/A) ENDOVEIN HARVEST OF RIGHT GREATER SAPHENOUS VEIN (Right)  Total Length of Stay:  LOS: 7 days   Subjective: Patient awake alert neurologically intact extubated last night, blood pressure still running high continue with p.o. blood pressure medication  Objective: Vital signs in last 24 hours: Temp:  [97.34 F (36.3 C)-99.14 F (37.3 C)] 98.42 F (36.9 C) (02/10 0700) Pulse Rate:  [45-90] 66 (02/10 0700) Cardiac Rhythm: Normal sinus rhythm (02/09 2030) Resp:  [12-25] 18 (02/10 0700) BP: (92-128)/(55-92) 92/55 (02/10 0700) SpO2:  [93 %-100 %] 100 % (02/10 0700) Arterial Line BP: (97-158)/(62-89) 118/69 (02/10 0700) FiO2 (%):  [36 %-50 %] 36 % (02/09 1839) Weight:  [99.2 kg] 99.2 kg (02/10 0500)  Filed Weights   07/07/20 0333 07/08/20 0527 07/09/20 0500  Weight: 95.5 kg 95.2 kg 99.2 kg    Weight change: 4.035 kg   Hemodynamic parameters for last 24 hours: PAP: (18-43)/(8-25) 34/19 CO:  [3.7 L/min-5.4 L/min] 5.4 L/min CI:  [1.8 L/min/m2-2.6 L/min/m2] 2.6 L/min/m2  Intake/Output from previous day: 02/09 0701 - 02/10 0700 In: 5707.6 [P.O.:60; I.V.:3571.7; Blood:773; IV Piggyback:1302.9] Out: 7001 [Urine:3280; Drains:26; Blood:1130; Chest Tube:238]  Intake/Output this shift: No intake/output data recorded.  Current Meds: Scheduled Meds: . acetaminophen  1,000 mg Oral Q6H   Or  . acetaminophen (TYLENOL) oral liquid 160 mg/5 mL  1,000 mg Per Tube Q6H  . aspirin EC  325 mg Oral Daily   Or  . aspirin  324 mg Per Tube Daily  . atorvastatin  80  mg Oral Daily  . bisacodyl  10 mg Oral Daily   Or  . bisacodyl  10 mg Rectal Daily  . chlorhexidine gluconate (MEDLINE KIT)  15 mL Mouth Rinse BID  . Chlorhexidine Gluconate Cloth  6 each Topical Daily  . docusate sodium  200 mg Oral Daily  . mouth rinse  15 mL Mouth Rinse Q4H  . metoprolol tartrate  12.5 mg Oral BID   Or  . metoprolol tartrate  12.5 mg Per Tube BID  . mupirocin ointment  1 application Nasal BID  . [START ON 07/10/2020] pantoprazole  40 mg Oral Daily  . sodium chloride flush  3 mL Intravenous Q12H   Continuous Infusions: . sodium chloride 10 mL/hr at 07/09/20 0700  . sodium chloride    . sodium chloride 20 mL/hr at 07/08/20 1523  . albumin human Stopped (07/08/20 1633)  . cefUROXime (ZINACEF)  IV Stopped (07/09/20 7494)  . dexmedetomidine (PRECEDEX) IV infusion Stopped (07/08/20 1740)  . insulin 0.9 mL/hr at 07/09/20 0700  . lactated ringers    . lactated ringers Stopped (07/08/20 1528)  . lactated ringers 10 mL/hr at 07/09/20 0700  . nitroGLYCERIN 20 mcg/min (07/09/20 0700)  . phenylephrine (NEO-SYNEPHRINE) Adult infusion Stopped (07/08/20 1529)   PRN Meds:.sodium chloride, albumin human, dextrose, lactated ringers, metoprolol tartrate, morphine injection, ondansetron (ZOFRAN) IV, oxyCODONE, sodium chloride flush, traMADol  General  appearance: alert, cooperative and no distress Neurologic: intact Heart: regular rate and rhythm, S1, S2 normal, no murmur, click, rub or gallop Lungs: diminished breath sounds bibasilar Abdomen: soft, non-tender; bowel sounds normal; no masses,  no organomegaly Extremities: extremities normal, atraumatic, no cyanosis or edema and Homans sign is negative, no sign of DVT Wound: Sternum intact  Lab Results: CBC: Recent Labs    07/08/20 2044 07/09/20 0311  WBC 10.5 8.3  HGB 12.2* 12.0*  HCT 37.8* 36.2*  PLT 162 132*   BMET:  Recent Labs    07/08/20 2044 07/09/20 0311  NA 136 138  K 4.3 4.3  CL 108 109  CO2 19* 20*   GLUCOSE 186* 126*  BUN 14 15  CREATININE 0.96 0.87  CALCIUM 7.6* 8.2*    CMET: Lab Results  Component Value Date   WBC 8.3 07/09/2020   HGB 12.0 (L) 07/09/2020   HCT 36.2 (L) 07/09/2020   PLT 132 (L) 07/09/2020   GLUCOSE 126 (H) 07/09/2020   CHOL 149 07/03/2020   TRIG 82 07/03/2020   HDL 34 (L) 07/03/2020   LDLCALC 99 07/03/2020   ALT 19 07/07/2020   AST 26 07/07/2020   NA 138 07/09/2020   K 4.3 07/09/2020   CL 109 07/09/2020   CREATININE 0.87 07/09/2020   BUN 15 07/09/2020   CO2 20 (L) 07/09/2020   INR 1.4 (H) 07/08/2020   HGBA1C 5.7 (H) 07/02/2020      PT/INR:  Recent Labs    07/08/20 1511  LABPROT 17.0*  INR 1.4*   Radiology: DG Chest Port 1 View  Result Date: 07/08/2020 CLINICAL DATA:  Pneumothorax EXAM: PORTABLE CHEST 1 VIEW COMPARISON:  Chest radiograph from one day prior. FINDINGS: Endotracheal tube tip is 3.6 cm above the carina. Enteric tube terminates in the gastric fundus. Intact sternotomy wires. Right internal jugular central venous catheter terminates in the right pulmonary artery. Left chest tube in place. Stable cardiomediastinal silhouette with top-normal heart size. No pneumothorax. No pleural effusion. Low lung volumes. No overt pulmonary edema. Mild bibasilar atelectasis. IMPRESSION: 1. No pneumothorax.  Well-positioned support structures. 2. Low lung volumes with mild bibasilar atelectasis. Electronically Signed   By: Ilona Sorrel M.D.   On: 07/08/2020 15:15   ECHO INTRAOPERATIVE TEE  Result Date: 07/08/2020  *INTRAOPERATIVE TRANSESOPHAGEAL REPORT *  Patient Name:   Leonard Donovan Date of Exam: 07/08/2020 Medical Rec #:  [id number]      Height:       69.0 in Accession #:    [id number]     Weight:       209.8 lb Date of Birth:  1965/09/04     BSA:          2.11 m Patient Age:    22 years       BP:           150/83 mmHg Patient Gender: M              HR:           51 bpm. Exam Location:  Anesthesiology Transesophogeal exam was perform intraoperatively  during surgical procedure. Patient was closely monitored under general anesthesia during the entirety of examination. Indications:     CAD Native Vessel i25.10 Sonographer:     Raquel Sarna Senior RDCS Performing Phys: [id number] Lilia Argue [id number] Diagnosing Phys: Roberts Gaudy MD Complications: No known complications during this procedure. PRE-OP FINDINGS  Left Ventricle: Marland Kitchen There was normal LV systolic function with the ejection fraction  estimated at 55-60% using the 3DQ volumetric package. There were no regional wall motion abnormalities. There was moderate concentric left ventricular hypertrophy with the LV wall thickness 1.15-1.20 cm. On the post-bypass exam, the LV systolic function was normal and unchanged from the pre-bypass exam. Right Ventricle: The right ventricle has normal systolic function. The cavity was normal. There is no increase in right ventricular wall thickness. On the post-bypass exam, the RV systolic function was normal and unchanged from the pre-bypass study. Left Atrium: Left atrial size was dilated. The left atrial appendage is well visualized and there is evidence of thrombus present. Left atrial appendage velocity is normal at greater than 40 cm/s. Right Atrium: Right atrial size was dilated. Interatrial Septum: No atrial level shunt detected by color flow Doppler. Pericardium: There is no evidence of pericardial effusion. Mitral Valve: The mitral valve is normal in structure. Mild thickening of the mitral valve leaflet. Mitral valve regurgitation is trivial by color flow Doppler. There is No evidence of mitral stenosis. Tricuspid Valve: The tricuspid valve was normal in structure. Tricuspid valve regurgitation is trivial by color flow Doppler. Aortic Valve: The aortic valve is tricuspid Aortic valve regurgitation was not visualized by color flow Doppler. There is no evidence of aortic valve stenosis. There is no evidence of a vegetation on the aortic valve. Pulmonic Valve: The pulmonic valve was  normal in structure, with normal. No evidence of pumonic stenosis. Pulmonic valve regurgitation is trivial by color flow Doppler. Aorta: The ascending aorta was normal in diameter with a well defined aortic root and sinotubular junction without dilataion or effacement. The aortic root at the sinuses of valsalva was 2.98 cm The STJ was 2.91 cm. The proximal ascending aorta was 2.93 cm. There was normal wall thickness of the ascending aorta. The descending aorta was normal in diameter and measured . There was grade 3-4 atherosclerotic plaque within the descending aorta was 2.03 cm in diameter. +--------------+-------++ LEFT VENTRICLE        +--------------+-------++ PLAX 2D               +------------+---------++ +--------------+-------++ 3D Volume EF          LVIDd:        4.80 cm +------------+---------++ +--------------+-------++ LV 3D EDV:  126.87 ml LVIDs:        3.70 cm +------------+---------++ +--------------+-------++ LV 3D ESV:  50.92 ml  LV PW:        1.60 cm +------------+---------++ +--------------+-------++ LV IVS:       0.80 cm +--------------+-------++ LV SV:        49 ml   +--------------+-------++ LV SV Index:  22.66   +--------------+-------++                       +--------------+-------++ +--------------+----------++ MITRAL VALVE             +--------------+----------++ MV Area (PHT):2.96 cm   +--------------+----------++ MV PHT:       74.24 msec +--------------+----------++ MV Decel Time:256 msec   +--------------+----------++ +--------------+----------++ MV E velocity:78.90 cm/s +--------------+----------++ MV A velocity:46.70 cm/s +--------------+----------++ MV E/A ratio: 1.69       +--------------+----------++  Roberts Gaudy MD Electronically signed by Roberts Gaudy MD Signature Date/Time: 07/08/2020/6:45:51 PM    Final      Assessment/Plan: S/P Procedure(s) (LRB): CORONARY ARTERY BYPASS GRAFTING (CABG), ON PUMP,  TIMES FOUR, LEFT INTERNAL MAMMARY ARTERY TO LAD, RIGHT SVG TO PDA, DISTAL CIRCUMFLEX, AND OM1 (N/A) TRANSESOPHAGEAL ECHOCARDIOGRAM (TEE) (N/A) ENDOVEIN HARVEST OF  RIGHT GREATER SAPHENOUS VEIN (Right) Mobilize Diuresis Diabetes control d/c tubes/lines See progression orders Expected Acute  Blood - loss Anemia- continue to monitor     Grace Isaac 07/09/2020 8:00 AM

## 2020-07-09 NOTE — Plan of Care (Signed)
  Problem: Elimination: Goal: Will not experience complications related to urinary retention Outcome: Progressing   Problem: Pain Managment: Goal: General experience of comfort will improve Outcome: Progressing   Problem: Safety: Goal: Ability to remain free from injury will improve Outcome: Progressing   Problem: Skin Integrity: Goal: Risk for impaired skin integrity will decrease Outcome: Progressing   Problem: Urinary Elimination: Goal: Ability to achieve and maintain adequate renal perfusion and functioning will improve Outcome: Progressing

## 2020-07-09 NOTE — Progress Notes (Signed)
Anesthesiology follow-up:  55 year old male S/P NSTEMI with preserved LV function now one day S/P CABG X 4. Anesthetic course uneventful.  Awake and alert, neuro intact, sitting in chair, in good spirits.  VS: T-36.6 BP- 115/87 HR- 73 (SR) RR- 18 O2 Sat 100% on 3L Gray PA 29/14 CO/CI- 5.4/2.6   K-4.3 glucose-126 BUN/Cr.- 15/0.87 H/H- 12.0/36.2 Platelets- 132,000  Extubated 3 1/2 hours post-op. No apparent complications.  Kipp Brood

## 2020-07-10 ENCOUNTER — Inpatient Hospital Stay (HOSPITAL_COMMUNITY): Payer: BC Managed Care – PPO

## 2020-07-10 LAB — GLUCOSE, CAPILLARY
Glucose-Capillary: 103 mg/dL — ABNORMAL HIGH (ref 70–99)
Glucose-Capillary: 104 mg/dL — ABNORMAL HIGH (ref 70–99)
Glucose-Capillary: 108 mg/dL — ABNORMAL HIGH (ref 70–99)
Glucose-Capillary: 110 mg/dL — ABNORMAL HIGH (ref 70–99)
Glucose-Capillary: 136 mg/dL — ABNORMAL HIGH (ref 70–99)
Glucose-Capillary: 146 mg/dL — ABNORMAL HIGH (ref 70–99)
Glucose-Capillary: 147 mg/dL — ABNORMAL HIGH (ref 70–99)

## 2020-07-10 LAB — BASIC METABOLIC PANEL
Anion gap: 9 (ref 5–15)
BUN: 30 mg/dL — ABNORMAL HIGH (ref 6–20)
CO2: 24 mmol/L (ref 22–32)
Calcium: 8.4 mg/dL — ABNORMAL LOW (ref 8.9–10.3)
Chloride: 99 mmol/L (ref 98–111)
Creatinine, Ser: 1.49 mg/dL — ABNORMAL HIGH (ref 0.61–1.24)
GFR, Estimated: 56 mL/min — ABNORMAL LOW (ref >60)
Glucose, Bld: 128 mg/dL — ABNORMAL HIGH (ref 70–99)
Potassium: 4 mmol/L (ref 3.5–5.1)
Sodium: 132 mmol/L — ABNORMAL LOW (ref 135–145)

## 2020-07-10 LAB — CBC
HCT: 37.7 % — ABNORMAL LOW (ref 39.0–52.0)
Hemoglobin: 12.1 g/dL — ABNORMAL LOW (ref 13.0–17.0)
MCH: 28.6 pg (ref 26.0–34.0)
MCHC: 32.1 g/dL (ref 30.0–36.0)
MCV: 89.1 fL (ref 80.0–100.0)
Platelets: 176 10*3/uL (ref 150–400)
RBC: 4.23 MIL/uL (ref 4.22–5.81)
RDW: 14.1 % (ref 11.5–15.5)
WBC: 15.1 10*3/uL — ABNORMAL HIGH (ref 4.0–10.5)
nRBC: 0 % (ref 0.0–0.2)

## 2020-07-10 MED ORDER — CARVEDILOL 6.25 MG PO TABS
6.2500 mg | ORAL_TABLET | Freq: Two times a day (BID) | ORAL | Status: DC
  Administered 2020-07-10 – 2020-07-13 (×6): 6.25 mg via ORAL
  Filled 2020-07-10 (×6): qty 1

## 2020-07-10 MED ORDER — ASPIRIN 81 MG PO TBEC: 81.0000 mg | DELAYED_RELEASE_TABLET | Freq: Every day | ORAL | 11 refills | Status: AC

## 2020-07-10 NOTE — Progress Notes (Signed)
TCTS BRIEF SICU PROGRESS NOTE  2 Days Post-Op  S/P Procedure(s) (LRB): CORONARY ARTERY BYPASS GRAFTING (CABG), ON PUMP, TIMES FOUR, LEFT INTERNAL MAMMARY ARTERY TO LAD, RIGHT SVG TO PDA, DISTAL CIRCUMFLEX, AND OM1 (N/A) TRANSESOPHAGEAL ECHOCARDIOGRAM (TEE) (N/A) ENDOVEIN HARVEST OF RIGHT GREATER SAPHENOUS VEIN (Right)   Stable day NSR w/ stable BP 90-100 systolic Breathing comfortably on nasal cannulae UOP marginal but adequate  Plan: Continue current plan  Purcell Nails, MD 07/10/2020 6:02 PM

## 2020-07-10 NOTE — Progress Notes (Signed)
Patient ID: Leonard Donovan, male   DOB: [birth year], 55 y.o.   MRN: [id number] TCTS DAILY ICU PROGRESS NOTE                   [address]            [place] [address]          [phone]   2 Days Post-Op Procedure(s) (LRB): CORONARY ARTERY BYPASS GRAFTING (CABG), ON PUMP, TIMES FOUR, LEFT INTERNAL MAMMARY ARTERY TO LAD, RIGHT SVG TO PDA, DISTAL CIRCUMFLEX, AND OM1 (N/A) TRANSESOPHAGEAL ECHOCARDIOGRAM (TEE) (N/A) ENDOVEIN HARVEST OF RIGHT GREATER SAPHENOUS VEIN (Right)  Total Length of Stay:  LOS: 8 days   Subjective: Patient awake alert neurologically intact sitting in the chair this morning, says he walked in the hall about 430 this morning more comfortable sitting up about him since temporary pacer is off  Objective: Vital signs in last 24 hours: Temp:  [96.8 F (36 C)-98.3 F (36.8 C)] 97.8 F (36.6 C) (02/11 0406) Pulse Rate:  [59-90] 87 (02/11 0600) Cardiac Rhythm: Normal sinus rhythm;Atrial paced (02/11 0410) Resp:  [13-27] 20 (02/11 0600) BP: (86-127)/(58-85) 107/79 (02/11 0600) SpO2:  [91 %-100 %] 98 % (02/11 0600) Arterial Line BP: (87-148)/(55-80) 95/55 (02/10 1315) Weight:  [98.9 kg] 98.9 kg (02/11 0500)  Filed Weights   07/08/20 0527 07/09/20 0500 07/10/20 0500  Weight: 95.2 kg 99.2 kg 98.9 kg    Weight change: -0.3 kg   Hemodynamic parameters for last 24 hours: PAP: (27-38)/(14-20) 29/14  Intake/Output from previous day: 02/10 0701 - 02/11 0700 In: 1151.1 [P.O.:720; I.V.:243.9; IV Piggyback:187.2] Out: 2125 [Urine:2045; Chest Tube:80]  Intake/Output this shift: No intake/output data recorded.  Current Meds: Scheduled Meds: . acetaminophen  1,000 mg Oral Q6H   Or  . acetaminophen (TYLENOL) oral liquid 160 mg/5 mL  1,000 mg Per Tube Q6H  . aspirin EC  81 mg Oral Daily   Or  . aspirin  81 mg Per Tube Daily  . atorvastatin  80 mg Oral Daily  . bisacodyl  10 mg Oral Daily   Or  . bisacodyl  10 mg Rectal Daily  . carvedilol  12.5  mg Oral BID WC  . Chlorhexidine Gluconate Cloth  6 each Topical Daily  . clopidogrel  75 mg Oral Daily  . docusate sodium  200 mg Oral Daily  . enoxaparin (LOVENOX) injection  40 mg Subcutaneous QHS  . insulin aspart  0-24 Units Subcutaneous Q4H  . lisinopril  10 mg Oral Daily  . mouth rinse  15 mL Mouth Rinse BID  . mupirocin ointment  1 application Nasal BID  . pantoprazole  40 mg Oral Daily  . sodium chloride flush  3 mL Intravenous Q12H   Continuous Infusions: . sodium chloride Stopped (07/09/20 0943)  . sodium chloride 20 mL/hr at 07/08/20 1523  . dexmedetomidine (PRECEDEX) IV infusion Stopped (07/08/20 1740)  . insulin Stopped (07/09/20 1319)  . lactated ringers    . lactated ringers Stopped (07/08/20 1528)  . lactated ringers Stopped (07/10/20 0533)  . nitroGLYCERIN Stopped (07/09/20 1140)  . phenylephrine (NEO-SYNEPHRINE) Adult infusion Stopped (07/08/20 1529)   PRN Meds:.sodium chloride, dextrose, ketorolac, lactated ringers, metoprolol tartrate, ondansetron (ZOFRAN) IV, oxyCODONE, sodium chloride flush, traMADol  General appearance: alert, cooperative and no distress Neurologic: intact Heart: regular rate and rhythm, S1, S2 normal, no murmur, click, rub or gallop Lungs: diminished breath sounds bibasilar Abdomen: soft, non-tender; bowel sounds normal; no masses,  no organomegaly  Extremities: extremities normal, atraumatic, no cyanosis or edema Wound: Sternum stable  Lab Results: CBC: Recent Labs    07/09/20 1539 07/10/20 0531  WBC 14.9* 15.1*  HGB 12.3* 12.1*  HCT 38.4* 37.7*  PLT 182 176   BMET:  Recent Labs    07/09/20 1539 07/10/20 0531  NA 136 132*  K 4.3 4.0  CL 104 99  CO2 23 24  GLUCOSE 144* 128*  BUN 21* 30*  CREATININE 1.46* 1.49*  CALCIUM 8.3* 8.4*    CMET: Lab Results  Component Value Date   WBC 15.1 (H) 07/10/2020   HGB 12.1 (L) 07/10/2020   HCT 37.7 (L) 07/10/2020   PLT 176 07/10/2020   GLUCOSE 128 (H) 07/10/2020   CHOL 149  07/03/2020   TRIG 82 07/03/2020   HDL 34 (L) 07/03/2020   LDLCALC 99 07/03/2020   ALT 19 07/07/2020   AST 26 07/07/2020   NA 132 (L) 07/10/2020   K 4.0 07/10/2020   CL 99 07/10/2020   CREATININE 1.49 (H) 07/10/2020   BUN 30 (H) 07/10/2020   CO2 24 07/10/2020   INR 1.4 (H) 07/08/2020   HGBA1C 5.7 (H) 07/02/2020      PT/INR:  Recent Labs    07/08/20 1511  LABPROT 17.0*  INR 1.4*   Radiology: No results found.   Chronic Kidney Disease   Stage I     GFR >90  Stage II    GFR 60-89  Stage IIIA GFR 45-59  Stage IIIB GFR 30-44  Stage IV   GFR 15-29  Stage V    GFR  <15  Lab Results  Component Value Date   CREATININE 1.49 (H) 07/10/2020   Estimated Creatinine Clearance: 67.3 mL/min (A) (by C-G formula based on SCr of 1.49 mg/dL (H)).   Assessment/Plan: S/P Procedure(s) (LRB): CORONARY ARTERY BYPASS GRAFTING (CABG), ON PUMP, TIMES FOUR, LEFT INTERNAL MAMMARY ARTERY TO LAD, RIGHT SVG TO PDA, DISTAL CIRCUMFLEX, AND OM1 (N/A) TRANSESOPHAGEAL ECHOCARDIOGRAM (TEE) (N/A) ENDOVEIN HARVEST OF RIGHT GREATER SAPHENOUS VEIN (Right) Mobilize Diuresis d/c tubes/lines Mild elevation cr , > 2 l uop  D/c toradol Decrease dose of coreg, stop ace inhibitor for now    Delight Ovens 07/10/2020 7:10 AM

## 2020-07-10 NOTE — Addendum Note (Signed)
Addendum  created 07/10/20 0830 by Adair Laundry, CRNA   Order list changed, Pharmacy for encounter modified

## 2020-07-11 ENCOUNTER — Inpatient Hospital Stay (HOSPITAL_COMMUNITY): Payer: BC Managed Care – PPO

## 2020-07-11 LAB — CBC
HCT: 32 % — ABNORMAL LOW (ref 39.0–52.0)
Hemoglobin: 10.5 g/dL — ABNORMAL LOW (ref 13.0–17.0)
MCH: 29 pg (ref 26.0–34.0)
MCHC: 32.8 g/dL (ref 30.0–36.0)
MCV: 88.4 fL (ref 80.0–100.0)
Platelets: 137 10*3/uL — ABNORMAL LOW (ref 150–400)
RBC: 3.62 MIL/uL — ABNORMAL LOW (ref 4.22–5.81)
RDW: 14.1 % (ref 11.5–15.5)
WBC: 9.9 10*3/uL (ref 4.0–10.5)
nRBC: 0 % (ref 0.0–0.2)

## 2020-07-11 LAB — GLUCOSE, CAPILLARY
Glucose-Capillary: 94 mg/dL (ref 70–99)
Glucose-Capillary: 109 mg/dL — ABNORMAL HIGH (ref 70–99)

## 2020-07-11 LAB — BASIC METABOLIC PANEL
Anion gap: 7 (ref 5–15)
BUN: 30 mg/dL — ABNORMAL HIGH (ref 6–20)
CO2: 25 mmol/L (ref 22–32)
Calcium: 8 mg/dL — ABNORMAL LOW (ref 8.9–10.3)
Chloride: 104 mmol/L (ref 98–111)
Creatinine, Ser: 1.39 mg/dL — ABNORMAL HIGH (ref 0.61–1.24)
GFR, Estimated: >60 mL/min (ref >60)
Glucose, Bld: 92 mg/dL (ref 70–99)
Potassium: 3.6 mmol/L (ref 3.5–5.1)
Sodium: 136 mmol/L (ref 135–145)

## 2020-07-11 MED ORDER — POTASSIUM CHLORIDE CRYS ER 20 MEQ PO TBCR
20.0000 meq | EXTENDED_RELEASE_TABLET | ORAL | Status: AC
  Administered 2020-07-11 (×3): 20 meq via ORAL
  Filled 2020-07-11 (×3): qty 1

## 2020-07-11 MED ORDER — POTASSIUM CHLORIDE CRYS ER 20 MEQ PO TBCR
20.0000 meq | EXTENDED_RELEASE_TABLET | Freq: Every day | ORAL | Status: AC
  Administered 2020-07-12 – 2020-07-13 (×2): 20 meq via ORAL
  Filled 2020-07-11 (×2): qty 1

## 2020-07-11 MED ORDER — ~~LOC~~ CARDIAC SURGERY, PATIENT & FAMILY EDUCATION: Freq: Once | Status: AC

## 2020-07-11 MED ORDER — FUROSEMIDE 40 MG PO TABS
40.0000 mg | ORAL_TABLET | Freq: Every day | ORAL | Status: AC
  Administered 2020-07-11 – 2020-07-13 (×3): 40 mg via ORAL
  Filled 2020-07-11 (×3): qty 1

## 2020-07-11 MED ORDER — INSULIN ASPART 100 UNIT/ML ~~LOC~~ SOLN: 0.0000 [IU] | Freq: Three times a day (TID) | SUBCUTANEOUS | Status: DC

## 2020-07-11 NOTE — Progress Notes (Signed)
Pt's shower bell was going off, when entered the room pt was standing in the shower naked. Pt have had large BM in the bed, pt had walked to bathroom by himself, he had stool all over the bathroom floor, pt stated he was trying to wash the stool off of him. Pt did not call for assistance, instructed/educated that he is not allowed to get in the shower. Pt still has pacing wires. Pt stated he was just washing his legs and abdomen, did not take shower. When checked, pt's dressing to chest tube site, and pacing wire site were wet. Helped pt back to chair, cleaned the pt and room with help of NT. Pt's chest tube and pacing wire sites cleaned with betadine swab, clean dressing applied. Pacing wires capped and taped, intact. Pt right leg multiple incision sites cleaned with betadine. Dry dressing applied to right inner thigh and right ankle. vitals stable, pt was in NSR 70's the whole time. Pt apologized . Instructed pt to call for assistance when getting OOB, or with any other needs. Call bell within reach, bed alarm on. Will continue to monitor.

## 2020-07-11 NOTE — Progress Notes (Signed)
Cardiac Rehab Phase I- Attempted to see patient for ambulation, but patient has just transferred from [facility] and is being assisted to bed by Nurse Tech. Patient not ready to walk at this time but will ambulate later with nursing staff.   Artist Pais, MS, ACSM CEP 07/11/2020 1415

## 2020-07-11 NOTE — Progress Notes (Addendum)
      [address]       [place] [address]             [phone]        CARDIOTHORACIC SURGERY PROGRESS NOTE   R3 Days Post-Op Procedure(s) (LRB): CORONARY ARTERY BYPASS GRAFTING (CABG), ON PUMP, TIMES FOUR, LEFT INTERNAL MAMMARY ARTERY TO LAD, RIGHT SVG TO PDA, DISTAL CIRCUMFLEX, AND OM1 (N/A) TRANSESOPHAGEAL ECHOCARDIOGRAM (TEE) (N/A) ENDOVEIN HARVEST OF RIGHT GREATER SAPHENOUS VEIN (Right)  Subjective: Feels sore in chest, otherwise no complaints  Objective: Vital signs: BP Readings from Last 1 Encounters:  07/11/20 106/75   Pulse Readings from Last 1 Encounters:  07/11/20 73   Resp Readings from Last 1 Encounters:  07/11/20 16   Temp Readings from Last 1 Encounters:  07/11/20 99.2 F (37.3 C) (Oral)    Hemodynamics:    Physical Exam:  Rhythm:   sinus  Breath sounds: clear  Heart sounds:  RRR  Incisions:  Clean and dry  Abdomen:  Soft, non-distended, non-tender  Extremities:  Warm, well-perfused    Intake/Output from previous day: 02/11 0701 - 02/12 0700 In: 408.6 [P.O.:120; I.V.:288.6] Out: 1240 [Urine:1240] Intake/Output this shift: Total I/O In: 133 [P.O.:120; I.V.:13] Out: -   Lab Results:  CBC: Recent Labs    07/10/20 0531 07/11/20 0339  WBC 15.1* 9.9  HGB 12.1* 10.5*  HCT 37.7* 32.0*  PLT 176 137*    BMET:  Recent Labs    07/10/20 0531 07/11/20 0339  NA 132* 136  K 4.0 3.6  CL 99 104  CO2 24 25  GLUCOSE 128* 92  BUN 30* 30*  CREATININE 1.49* 1.39*  CALCIUM 8.4* 8.0*     PT/INR:   Recent Labs    07/08/20 1511  LABPROT 17.0*  INR 1.4*    CBG (last 3)  Recent Labs    07/10/20 2301 07/11/20 0338 07/11/20 0810  GLUCAP 104* 94 109*    ABG    Component Value Date/Time   PHART 7.358 07/08/2020 1938   PCO2ART 37.1 07/08/2020 1938   PO2ART 69 (L) 07/08/2020 1938   HCO3 20.8 07/08/2020 1938   TCO2 22 07/08/2020 1938   ACIDBASEDEF 4.0 (H) 07/08/2020 1938   O2SAT 93.0 07/08/2020 1938     CXR: PORTABLE CHEST 1 VIEW  COMPARISON:  Portable chest 07/10/2020 and earlier.  FINDINGS: Portable AP semi upright view at 0616 hours. Right IJ introducer sheath remains. Low but improved lung volumes from yesterday. Left chest tube removed. No pneumothorax. No pulmonary edema. Patchy bilateral lung base opacity mildly improved. Stable cardiac size and mediastinal contours. Sequelae of CABG. Negative visible bowel gas. Stable visualized osseous structures.  IMPRESSION: 1. Left chest tube removed. No pneumothorax. 2. Low but improved lung volumes from yesterday. Bilateral atelectasis with probable small pleural effusions.   Electronically Signed   By: Odessa Fleming M.D.   On: 07/11/2020 08:55  Assessment/Plan: S/P Procedure(s) (LRB): CORONARY ARTERY BYPASS GRAFTING (CABG), ON PUMP, TIMES FOUR, LEFT INTERNAL MAMMARY ARTERY TO LAD, RIGHT SVG TO PDA, DISTAL CIRCUMFLEX, AND OM1 (N/A) TRANSESOPHAGEAL ECHOCARDIOGRAM (TEE) (N/A) ENDOVEIN HARVEST OF RIGHT GREATER SAPHENOUS VEIN (Right)  Doing well POD3 Maintaining NSR w/ stable BP Breathing comfortably on room air and CXR looks good Hypertension, currently well-controlled   Mobilize  Diuresis  DAPT and statin  Beta blocker  Transfer 4E  Purcell Nails, MD 07/11/2020 9:38 AM

## 2020-07-11 NOTE — Plan of Care (Signed)
  Problem: Education: Goal: Understanding of cardiac disease, CV risk reduction, and recovery process will improve Outcome: Progressing Goal: Individualized Educational Video(s) Outcome: Progressing   Problem: Activity: Goal: Ability to tolerate increased activity will improve Outcome: Progressing   Problem: Cardiac: Goal: Ability to achieve and maintain adequate cardiovascular perfusion will improve Outcome: Progressing   Problem: Health Behavior/Discharge Planning: Goal: Ability to safely manage health-related needs after discharge will improve Outcome: Progressing   Problem: Education: Goal: Knowledge of General Education information will improve Description: Including pain rating scale, medication(s)/side effects and non-pharmacologic comfort measures Outcome: Progressing   Problem: Health Behavior/Discharge Planning: Goal: Ability to manage health-related needs will improve Outcome: Progressing   Problem: Clinical Measurements: Goal: Ability to maintain clinical measurements within normal limits will improve Outcome: Progressing Goal: Will remain free from infection Outcome: Progressing Goal: Diagnostic test results will improve Outcome: Progressing Goal: Respiratory complications will improve Outcome: Progressing Goal: Cardiovascular complication will be avoided Outcome: Progressing   Problem: Activity: Goal: Risk for activity intolerance will decrease Outcome: Progressing   Problem: Nutrition: Goal: Adequate nutrition will be maintained Outcome: Progressing   Problem: Coping: Goal: Level of anxiety will decrease Outcome: Progressing   Problem: Elimination: Goal: Will not experience complications related to bowel motility Outcome: Progressing   Problem: Pain Managment: Goal: General experience of comfort will improve Outcome: Progressing   Problem: Safety: Goal: Ability to remain free from injury will improve Outcome: Progressing   Problem: Skin  Integrity: Goal: Risk for impaired skin integrity will decrease Outcome: Progressing   Problem: Education: Goal: Will demonstrate proper wound care and an understanding of methods to prevent future damage Outcome: Progressing Goal: Knowledge of disease or condition will improve Outcome: Progressing Goal: Knowledge of the prescribed therapeutic regimen will improve Outcome: Progressing Goal: Individualized Educational Video(s) Outcome: Progressing   Problem: Activity: Goal: Risk for activity intolerance will decrease Outcome: Progressing   Problem: Cardiac: Goal: Will achieve and/or maintain hemodynamic stability Outcome: Progressing   Problem: Clinical Measurements: Goal: Postoperative complications will be avoided or minimized Outcome: Progressing   Problem: Respiratory: Goal: Respiratory status will improve Outcome: Progressing   Problem: Skin Integrity: Goal: Wound healing without signs and symptoms of infection Outcome: Progressing Goal: Risk for impaired skin integrity will decrease Outcome: Progressing

## 2020-07-12 ENCOUNTER — Inpatient Hospital Stay (HOSPITAL_COMMUNITY): Payer: BC Managed Care – PPO

## 2020-07-12 LAB — BASIC METABOLIC PANEL WITH GFR
Anion gap: 10 (ref 5–15)
BUN: 30 mg/dL — ABNORMAL HIGH (ref 6–20)
CO2: 22 mmol/L (ref 22–32)
Calcium: 8 mg/dL — ABNORMAL LOW (ref 8.9–10.3)
Chloride: 106 mmol/L (ref 98–111)
Creatinine, Ser: 1.07 mg/dL (ref 0.61–1.24)
GFR, Estimated: >60 mL/min
Glucose, Bld: 100 mg/dL — ABNORMAL HIGH (ref 70–99)
Potassium: 4.1 mmol/L (ref 3.5–5.1)
Sodium: 138 mmol/L (ref 135–145)

## 2020-07-12 LAB — CBC
HCT: 33.1 % — ABNORMAL LOW (ref 39.0–52.0)
Hemoglobin: 10.8 g/dL — ABNORMAL LOW (ref 13.0–17.0)
MCH: 28.7 pg (ref 26.0–34.0)
MCHC: 32.6 g/dL (ref 30.0–36.0)
MCV: 88 fL (ref 80.0–100.0)
Platelets: 165 10*3/uL (ref 150–400)
RBC: 3.76 MIL/uL — ABNORMAL LOW (ref 4.22–5.81)
RDW: 14.2 % (ref 11.5–15.5)
WBC: 8.1 10*3/uL (ref 4.0–10.5)
nRBC: 0 % (ref 0.0–0.2)

## 2020-07-12 NOTE — Progress Notes (Signed)
Progress Note  Patient Name: Leonard Donovan Date of Encounter: 07/12/2020  Primary Cardiologist:   Tobias Alexander, MD   Subjective   No acute pain or SOB.     Inpatient Medications    Scheduled Meds: . acetaminophen  1,000 mg Oral Q6H  . aspirin EC  81 mg Oral Daily  . atorvastatin  80 mg Oral Daily  . carvedilol  6.25 mg Oral BID WC  . Chlorhexidine Gluconate Cloth  6 each Topical Daily  . clopidogrel  75 mg Oral Daily  . furosemide  40 mg Oral Daily  . pantoprazole  40 mg Oral Daily  . potassium chloride  20 mEq Oral Daily  . sodium chloride flush  3 mL Intravenous Q12H   Continuous Infusions:  PRN Meds: metoprolol tartrate, ondansetron (ZOFRAN) IV, oxyCODONE, sodium chloride flush, traMADol   Vital Signs    Vitals:   07/11/20 1954 07/11/20 2315 07/12/20 0430 07/12/20 0935  BP: 112/73 124/81 127/80 (!) 170/103  Pulse: 72 75 74 76  Resp: 20 18 14 15   Temp: 98.7 F (37.1 C) 99.1 F (37.3 C) 98.2 F (36.8 C) 98.5 F (36.9 C)  TempSrc: Oral Oral Oral Oral  SpO2: 93% 91% 91% 95%  Weight:   97.2 kg   Height:        Intake/Output Summary (Last 24 hours) at 07/12/2020 1116 Last data filed at 07/12/2020 0434 Gross per 24 hour  Intake 440 ml  Output 1200 ml  Net -760 ml   Filed Weights   07/10/20 0500 07/11/20 0500 07/12/20 0430  Weight: 98.9 kg 98.4 kg 97.2 kg    Telemetry    NSR - Personally Reviewed  ECG    NA - Personally Reviewed  Physical Exam   GEN: No  acute distress.   Neck: No  JVD Cardiac: RRR, no murmurs, rubs, or gallops.  Respiratory: Clear   to auscultation bilaterally. GI: Soft, nontender, non-distended, normal bowel sounds  MS:  Trace edema; No deformity. Neuro:   Nonfocal  Psych: Oriented and appropriate    Labs    Chemistry Recent Labs  Lab 07/07/20 0347 07/08/20 0354 07/10/20 0531 07/11/20 0339 07/12/20 0204  NA 141   < > 132* 136 138  K 3.6   < > 4.0 3.6 4.1  CL 109   < > 99 104 106  CO2 23   < > 24 25 22    GLUCOSE 90   < > 128* 92 100*  BUN 15   < > 30* 30* 30*  CREATININE 1.00   < > 1.49* 1.39* 1.07  CALCIUM 8.8*   < > 8.4* 8.0* 8.0*  PROT 6.1*  --   --   --   --   ALBUMIN 3.0*  --   --   --   --   AST 26  --   --   --   --   ALT 19  --   --   --   --   ALKPHOS 76  --   --   --   --   BILITOT 0.9  --   --   --   --   GFRNONAA >60   < > 56* >60 >60  ANIONGAP 9   < > 9 7 10    < > = values in this interval not displayed.     Hematology Recent Labs  Lab 07/10/20 0531 07/11/20 0339 07/12/20 0204  WBC 15.1* 9.9 8.1  RBC 4.23 3.62*  3.76*  HGB 12.1* 10.5* 10.8*  HCT 37.7* 32.0* 33.1*  MCV 89.1 88.4 88.0  MCH 28.6 29.0 28.7  MCHC 32.1 32.8 32.6  RDW 14.1 14.1 14.2  PLT 176 137* 165    Cardiac EnzymesNo results for input(s): TROPONINI in the last 168 hours. No results for input(s): TROPIPOC in the last 168 hours.   BNPNo results for input(s): BNP, PROBNP in the last 168 hours.   DDimer  No results for input(s): DDIMER in the last 168 hours.   Radiology    DG Chest 2 View  Result Date: 07/12/2020 CLINICAL DATA:  Status post CABG. EXAM: CHEST - 2 VIEW COMPARISON:  Chest x-ray from yesterday. FINDINGS: Interval removal of the right internal jugular sheath. Stable cardiomegaly status post CABG. Normal pulmonary vascularity. Slightly improved mild bibasilar atelectasis. Unchanged small bilateral pleural effusions. No pneumothorax. No acute osseous abnormality. IMPRESSION: 1. Slightly improved bibasilar atelectasis. Unchanged small bilateral pleural effusions. Electronically Signed   By: Obie Dredge M.D.   On: 07/12/2020 07:18   DG Chest Port 1 View  Result Date: 07/11/2020 CLINICAL DATA:  55 year old male postoperative day 3 status post CABG. EXAM: PORTABLE CHEST 1 VIEW COMPARISON:  Portable chest 07/10/2020 and earlier. FINDINGS: Portable AP semi upright view at 0616 hours. Right IJ introducer sheath remains. Low but improved lung volumes from yesterday. Left chest tube removed.  No pneumothorax. No pulmonary edema. Patchy bilateral lung base opacity mildly improved. Stable cardiac size and mediastinal contours. Sequelae of CABG. Negative visible bowel gas. Stable visualized osseous structures. IMPRESSION: 1. Left chest tube removed. No pneumothorax. 2. Low but improved lung volumes from yesterday. Bilateral atelectasis with probable small pleural effusions. Electronically Signed   By: Odessa Fleming M.D.   On: 07/11/2020 08:55    Cardiac Studies   CARDIAC CATHETERIZATION: 07/02/2020  1st Mrg lesion is 75% stenosed.  Mid Cx lesion is 80% stenosed.  Prox RCA lesion is 80% stenosed.  Mid LAD lesion is 50% stenosed.  Dist LAD lesion is 75% stenosed.  RPDA-1 lesion is 75% stenosed.  RPDA-2 lesion is 50% stenosed.  RPAV lesion is 50% stenosed.  Prox LAD to Mid LAD lesion is 75% stenosed.  The left ventricular systolic function is normal.  LV end diastolic pressure is normal.  The left ventricular ejection fraction is 55-65% by visual estimate.  There is no aortic valve stenosis. Diffuse three vessel disease, particularly throughout the LAD. Plan for cardiac surgery consult. Distal small vessel disease may affect quality of targets. I stressed the importance of risk factor modification with the patient including lipid lowering therapy and BP control. Results discussed with his wife, Albin Felling.   ECHOCARDIOGRAM COMPLETE: 07/01/2020  1. Left ventricular ejection fraction, by estimation, is 60 to 65%. The  left ventricle has normal function. The left ventricle has no regional  wall motion abnormalities. Left ventricular diastolic parameters are  consistent with Grade II diastolic  dysfunction (pseudonormalization). Elevated left atrial pressure.  2. Right ventricular systolic function is normal. The right ventricular  size is normal.  3. The mitral valve is grossly normal. Mild mitral valve regurgitation.  4. The aortic valve is normal in structure. Aortic valve regurgitation  is  not visualized.   Patient Profile     55 y.o. male with a PMH of HTN, glaucoma, and recent COVID-19 infection (diagnosed on 06/18/20), with no prior cardiac history, though had not followed with a medical provider in about 20 years, who presented with chest pain, found to have an  NSTEMI.  Now status post CABG.    Assessment & Plan      NSTEMI/CABG:     Doing relatively well post CABG.    HTN: BP spike this morning but otherwise has been good.     HLD:    On high dose statin.   Continue.       Pre-DM type 2:     A1C 5.7.  Diet management.     CARDIOLOGY RECOMMENDATIONS:  Discharge is anticipated in the next 48 hours. Recommendations for medications and follow up:  Discharge Medications: Continue medications as they are currently listed in the [facility]. Exceptions to the above:  No change  Follow Up: The patient's Primary Cardiologist is as below  Follow up in the office in 2 week(s).  Note:  He would like to follow up with Dr. Jens Som in [facility] since he lives nearer to that office.  We will arrange.    Signed,  Rollene Rotunda, MD  11:20 AM 07/12/2020  [facility]   For questions or updates, please contact [facility] Please consult [email or web address] for contact info under Cardiology/STEMI.   Signed, Rollene Rotunda, MD  07/12/2020, 11:16 AM

## 2020-07-12 NOTE — Progress Notes (Signed)
Pt ambulated 800 feet with front wheel walker. Tolerated well, HR stayed in 70's. Will continue to monitor.

## 2020-07-12 NOTE — Progress Notes (Signed)
Mobility Specialist: Progress Note   07/12/20 1500  Mobility  Activity Ambulated in hall  Level of Assistance Modified independent, requires aide device or extra time  Assistive Device Front wheel walker  Distance Ambulated (ft) 470 ft  Mobility Response Tolerated well  Mobility performed by Mobility specialist  Bed Position Chair  $Mobility charge 1 Mobility   Pre-Mobility: 81 HR, 96% SpO2 Post-Mobility: 73 HR, 138/85 BP, 100% SpO2  Pt asx during ambulation. Pt back to chair after walk per request. Reviewed sternal precautions with pt. Pt expressed he is having productive cough and is using his pillow to brace his sternum.   [facility] Leonard Donovan Mobility Specialist Mobility Specialist Phone: [phone]

## 2020-07-12 NOTE — Progress Notes (Signed)
EPW wires removed without difficulty.  VSS, bedrest until 1040.

## 2020-07-12 NOTE — Progress Notes (Addendum)
      [address]       [place] [address]             [phone]        4 Days Post-Op Procedure(s) (LRB): CORONARY ARTERY BYPASS GRAFTING (CABG), ON PUMP, TIMES FOUR, LEFT INTERNAL MAMMARY ARTERY TO LAD, RIGHT SVG TO PDA, DISTAL CIRCUMFLEX, AND OM1 (N/A) TRANSESOPHAGEAL ECHOCARDIOGRAM (TEE) (N/A) ENDOVEIN HARVEST OF RIGHT GREATER SAPHENOUS VEIN (Right)  Subjective: Patient sitting in chair. He is starting to have loose stools.  Objective: Vital signs in last 24 hours: Temp:  [98.1 F (36.7 C)-99.1 F (37.3 C)] 98.2 F (36.8 C) (02/13 0430) Pulse Rate:  [66-75] 74 (02/13 0430) Cardiac Rhythm: Normal sinus rhythm (02/12 1900) Resp:  [14-21] 14 (02/13 0430) BP: (103-127)/(64-81) 127/80 (02/13 0430) SpO2:  [90 %-100 %] 91 % (02/13 0430) Weight:  [97.2 kg] 97.2 kg (02/13 0430)  Pre op weight 95.2 kg Current Weight  07/12/20 97.2 kg      Intake/Output from previous day: 02/12 0701 - 02/13 0700 In: 589.8 [P.O.:560; I.V.:29.8] Out: 1200 [Urine:1200]   Physical Exam:  Cardiovascular: RRR Pulmonary: Slightly diminished bibasilar breath sounds Abdomen: Soft, non tender, bowel sounds present. Extremities: Mild bilateral lower extremity edema. Wounds: Sternal and RLE wounds are clean and dry.  No erythema or signs of infection. Former JP drain site with slight sero sanguinous drainage  Lab Results: CBC: Recent Labs    07/11/20 0339 07/12/20 0204  WBC 9.9 8.1  HGB 10.5* 10.8*  HCT 32.0* 33.1*  PLT 137* 165   BMET:  Recent Labs    07/11/20 0339 07/12/20 0204  NA 136 138  K 3.6 4.1  CL 104 106  CO2 25 22  GLUCOSE 92 100*  BUN 30* 30*  CREATININE 1.39* 1.07  CALCIUM 8.0* 8.0*    PT/INR:  Lab Results  Component Value Date   INR 1.4 (H) 07/08/2020   INR 1.1 07/07/2020   ABG:  INR: Will add last result for INR, ABG once components are confirmed Will add last 4 CBG results once components are confirmed  Assessment/Plan:  1. CV -  S/p NSTEMI. SR with HR in the 70's this am. On Coreg 6.25 mg bid, Plavix 75 mg daily, baby ec asa 2.  Pulmonary - On room air. PA/LAT CXR shows no pneumothorax, small b/l pleural effusions, and improved bibasilar atelectasis.Encourage incentive spirometer. 3. Volume Overload - On Lasix 40 mg daily 4.  Expected post op acute blood loss anemia - H and H this am stable at 10.8 and 33.1 5. Creatinine decreased from 1.39 to 1.07. 6. Thrombocytopenia resolved-platelets this am up to 165,000 7. Stop stool softeners 8. Remove EPW 9. Hopefully, home in am  Lelon Huh [facility] 07/12/2020,8:16 AM    I have seen and examined the patient and agree with the assessment and plan as outlined.  Anticipate likely d/c home tomorrow.  Purcell Nails, MD 07/12/2020 11:30 AM

## 2020-07-13 ENCOUNTER — Other Ambulatory Visit: Payer: Self-pay | Admitting: Cardiology

## 2020-07-13 DIAGNOSIS — E876 Hypokalemia: Secondary | ICD-10-CM

## 2020-07-13 DIAGNOSIS — Z951 Presence of aortocoronary bypass graft: Secondary | ICD-10-CM

## 2020-07-13 MED ORDER — LOSARTAN POTASSIUM 25 MG PO TABS: 25.0000 mg | ORAL_TABLET | Freq: Every evening | ORAL | Status: DC

## 2020-07-13 MED ORDER — LOSARTAN POTASSIUM 25 MG PO TABS: 25.0000 mg | ORAL_TABLET | Freq: Every evening | ORAL | 1 refills | Status: DC

## 2020-07-13 MED ORDER — TRAMADOL HCL 50 MG PO TABS: 50.0000 mg | ORAL_TABLET | Freq: Four times a day (QID) | ORAL | 0 refills | Status: AC | PRN

## 2020-07-13 MED ORDER — CLOPIDOGREL BISULFATE 75 MG PO TABS: 75.0000 mg | ORAL_TABLET | Freq: Every day | ORAL | 1 refills | Status: DC

## 2020-07-13 MED ORDER — CARVEDILOL 6.25 MG PO TABS: 6.2500 mg | ORAL_TABLET | Freq: Two times a day (BID) | ORAL | 1 refills | Status: DC

## 2020-07-13 MED ORDER — ATORVASTATIN CALCIUM 80 MG PO TABS: 80.0000 mg | ORAL_TABLET | Freq: Every day | ORAL | 1 refills | Status: DC

## 2020-07-13 MED ORDER — OXYCODONE HCL 5 MG PO TABS: 5.0000 mg | ORAL_TABLET | Freq: Four times a day (QID) | ORAL | 0 refills | Status: DC | PRN

## 2020-07-13 MED FILL — Mannitol IV Soln 20%: INTRAVENOUS | Qty: 500 | Status: AC

## 2020-07-13 MED FILL — Sodium Chloride IV Soln 0.9%: INTRAVENOUS | Qty: 2000 | Status: AC

## 2020-07-13 MED FILL — Lidocaine HCl Local Soln Prefilled Syringe 100 MG/5ML (2%): INTRAMUSCULAR | Qty: 5 | Status: AC

## 2020-07-13 MED FILL — Heparin Sodium (Porcine) Inj 1000 Unit/ML: INTRAMUSCULAR | Qty: 20 | Status: AC

## 2020-07-13 MED FILL — Electrolyte-R (PH 7.4) Solution: INTRAVENOUS | Qty: 5000 | Status: AC

## 2020-07-13 MED FILL — Albumin, Human Inj 5%: INTRAVENOUS | Qty: 500 | Status: AC

## 2020-07-13 NOTE — Progress Notes (Signed)
Patient given discharge instructions. PIV removed. Site clean dry and intact. Telemetry removed, CCMD notified. Patient does not want to take oxycodone PRN for pain when discharged, instead would like to get a prescription for Tramadol. Jari Favre PA paged. Waiting on response. Patient notified I am waiting on PA to respond.  Kenard Gower, RN

## 2020-07-13 NOTE — Progress Notes (Signed)
Removed two chest tube sutures. Applied steri strips. Patient tolerated well. Painted sternal incision with betadine.  Kenard Gower, RN

## 2020-07-13 NOTE — Progress Notes (Signed)
CARDIAC REHAB PHASE I   PRE:  Rate/Rhythm: 80 SR   BP:  Supine:   Sitting: 165/103  Standing:    SaO2: 97 RA  MODE:  Ambulation: 470 ft   POST:  Rate/Rhythm: 89 SR  BP:  Supine:   Sitting: 144/102  Standing:    SaO2: 98 RA  Pt tolerated exercise will with standby assist, no [name] was needed today. Education provided to pt and wife and was accepted, went over sternal precautions, health heart diet, EX RX for home, IS use, will refer to cardiac rehab in [place]. Plan is to be D/C later today.   [id number]  Leonard Donovan ACSM-EP 07/13/2020 10:04 AM

## 2020-07-13 NOTE — Op Note (Signed)
NAME: Leonard Donovan, Leonard Donovan MEDICAL RECORD [id number] ACCOUNT [id number] DATE OF BIRTH:1966/02/02 FACILITY: [facility] LOCATION: [facility] PHYSICIAN:Hindy Perrault Bari Jeyli Zwicker, MD  OPERATIVE REPORT  DATE OF PROCEDURE:  07/08/2020  PREOPERATIVE DIAGNOSES:  Recent non-STEMI myocardial infarction with 3-vessel coronary artery disease.  POSTOPERATIVE DIAGNOSES:  Recent non-STEMI myocardial infarction with 3-vessel coronary artery disease.  SURGICAL PROCEDURE:  Coronary artery bypass grafting x4 with the left internal mammary to the left anterior descending coronary artery, reverse saphenous vein graft to the first obtuse marginal, reverse saphenous vein graft to the distal circumflex,  reverse saphenous vein graft to the posterior descending with right thigh and calf endoscopic vein harvesting of the greater saphenous vein.  SURGEON:  Sheliah Plane, MD  FIRST ASSISTANT:  Doree Fudge, PA  BRIEF HISTORY:  The patient is a 55 year old male with no previous history of coronary artery disease who presented several days before surgery with left shoulder, back and chest pain of approximately 2 days' duration.  He was initially seen at the [facility] on [facility]  with these complaints.  On admission, he was noted to have a blood pressure of 250/130 and was started on heparin and nitroglycerin.  Troponins were elevated.  CTA of the chest was not done.  When a bed was available at [facility],  the patient was transferred to [facility] and ultimately underwent cardiac catheterization, which demonstrated diffuse 3-vessel coronary artery disease, but with proximal right coronary artery disease and diffuse distal disease.  He also had  significant proximal disease in circumflex and LAD.  Overall, ventricular function appeared preserved.  At the time of coronary artery bypass grafting, the patient was noted to have a very thick hypertensive ventricle.  The patient is right arm dominant  and works as a Music therapist.   Two separate vascular lab studies on the left arm showed obliteration of the palmar arch with radial compression, so the radial artery was not used.  Risks and options of surgery were discussed with the patient and his wife in  detail and he was agreeable to proceed.  DESCRIPTION OF PROCEDURE:  With Swan-Ganz and arterial line monitors in place, the patient underwent general endotracheal anesthesia without incident.  The skin of the chest and legs was prepped with Betadine, draped in usual sterile manner.  Appropriate  timeout was performed.  We then proceeded with harvesting of the right greater saphenous vein from the calf and thigh.  This vein was of excellent quality and caliber.  Median sternotomy was performed.  The left internal mammary artery was dissected  down as a pedicle graft.  The distal artery was divided and had good free flow.  The vessel was hydrostatically dilated with heparinized saline.  Pericardium was opened.  Overall, ventricular function appeared preserved and without significant valvular  disease on intraoperative TEE.  It should be noted that his left ventricle was significantly hypertrophied.  The patient was systemically heparinized.  The ascending aorta was cannulated.  The right atrium was cannulated.  Aortic root vent cardioplegia  needle was introduced into the ascending aorta.  The patient was placed on cardiopulmonary bypass 2.4 liters per minute per meter square.  Sites of anastomosis were selected and dissected at the epicardium.  As noted, the patient did have significant  distal disease.  His body temperature was cooled to 32 degrees.  Aortic crossclamp was applied and 500 mL of cold blood potassium cardioplegia was administered antegrade.  We turned our attention first to the distal right coronary artery, which  was  calcified in the proximal posterior descending.  The vessel was relatively small, but was opened and admitted a 1 mm probe distally and a 1.5 mm probe  proximally.  Using a running 7-0 Prolene, distal anastomosis was performed.  The heart was then  elevated.  The distal circumflex was opened and admitted a 1.5 mm probe distally.  Using a running 7-0 Prolene, distal anastomosis was performed.  In a similar fashion, the first obtuse marginal artery was opened.  Using a segment of reverse saphenous  vein graft, distal anastomosis was performed with a running 7-0 Prolene.  Additional cold blood cardioplegia was administered down the vein grafts.  Attention was then turned to the left anterior descending coronary artery.  This vessel did have distal  disease.  The vessel was opened and was of reasonable size, admitting a 1.5 mm probe distally and proximally.  Using a running 8-0 Prolene, the left internal mammary artery was anastomosed to left anterior descending coronary artery.  With crossclamp  still in place, 3 punch aortotomies were performed, each of the 3 vein grafts were anastomosed to the ascending aorta.  Bulldog was removed from the mammary artery with prompt rise in myocardial septal temperature.  The heart was allowed to passively  fill and deair, and the proximal anastomoses were completed.  Aortic crossclamp was removed with total crossclamp time of 106 minutes.  The patient spontaneously converted to a sinus rhythm.  Sites of anastomosis were inspected.  Several repair sutures  were placed in the obtuse marginal graft.  With the operative field hemostatic, the patient was then ventilated and weaned from cardiopulmonary bypass without difficulty.  He remained hemodynamically stable.  He was decannulated in the usual fashion.   Protamine sulfate was administered.  With operative field hemostatic, atrial and ventricular pacing wires were applied.  Graft markers were applied.  A left pleural and a Blake mediastinal drain were left in place.  The pericardium was loosely  reapproximated.  The sternum was closed with #6 stainless steel wire.  Fascia was  closed with interrupted 0 Vicryl, running 3-0 Vicryl in subcutaneous tissue, 3-0 subcuticular stitches in the skin edges.  Dry dressings were applied.  Sponge and needle  count was reported as correct at completion of the procedure.  The patient tolerated the procedure without obvious complication and was transferred to the surgical intensive care unit for further postoperative care.  Total pump time was 147 minutes.  The patient did not require any blood bank blood products during the operative procedure.  IN/NUANCE  D:07/13/2020 T:07/13/2020 JOB:[id number]

## 2020-07-13 NOTE — Progress Notes (Addendum)
[address]       [place] [address]             [phone]      5 Days Post-Op Procedure(s) (LRB): CORONARY ARTERY BYPASS GRAFTING (CABG), ON PUMP, TIMES FOUR, LEFT INTERNAL MAMMARY ARTERY TO LAD, RIGHT SVG TO PDA, DISTAL CIRCUMFLEX, AND OM1 (N/A) TRANSESOPHAGEAL ECHOCARDIOGRAM (TEE) (N/A) ENDOVEIN HARVEST OF RIGHT GREATER SAPHENOUS VEIN (Right) Subjective: Feels good this morning and ready for discharge home.   Objective: Vital signs in last 24 hours: Temp:  [97.8 F (36.6 C)-99.1 F (37.3 C)] 97.8 F (36.6 C) (02/14 0443) Pulse Rate:  [68-76] 75 (02/14 0443) Cardiac Rhythm: Normal sinus rhythm (02/13 1900) Resp:  [15-22] 19 (02/14 0443) BP: (125-170)/(78-103) 160/98 (02/14 0443) SpO2:  [95 %-99 %] 98 % (02/14 0443)     Intake/Output from previous day: 02/13 0701 - 02/14 0700 In: 680 [P.O.:680] Out: 500 [Urine:500] Intake/Output this shift: No intake/output data recorded.  General appearance: alert, cooperative and no distress Heart: regular rate and rhythm, S1, S2 normal, no murmur, click, rub or gallop Lungs: clear to auscultation bilaterally Abdomen: soft, non-tender; bowel sounds normal; no masses,  no organomegaly Extremities: extremities normal, atraumatic, no cyanosis or edema Wound: clean and dry  Lab Results: Recent Labs    07/11/20 0339 07/12/20 0204  WBC 9.9 8.1  HGB 10.5* 10.8*  HCT 32.0* 33.1*  PLT 137* 165   BMET:  Recent Labs    07/11/20 0339 07/12/20 0204  NA 136 138  K 3.6 4.1  CL 104 106  CO2 25 22  GLUCOSE 92 100*  BUN 30* 30*  CREATININE 1.39* 1.07  CALCIUM 8.0* 8.0*    PT/INR: No results for input(s): LABPROT, INR in the last 72 hours. ABG    Component Value Date/Time   PHART 7.358 07/08/2020 1938   HCO3 20.8 07/08/2020 1938   TCO2 22 07/08/2020 1938   ACIDBASEDEF 4.0 (H) 07/08/2020 1938   O2SAT 93.0 07/08/2020 1938   CBG (last 3)  Recent Labs    07/10/20 2301 07/11/20 0338 07/11/20 0810   GLUCAP 104* 94 109*    Assessment/Plan: S/P Procedure(s) (LRB): CORONARY ARTERY BYPASS GRAFTING (CABG), ON PUMP, TIMES FOUR, LEFT INTERNAL MAMMARY ARTERY TO LAD, RIGHT SVG TO PDA, DISTAL CIRCUMFLEX, AND OM1 (N/A) TRANSESOPHAGEAL ECHOCARDIOGRAM (TEE) (N/A) ENDOVEIN HARVEST OF RIGHT GREATER SAPHENOUS VEIN (Right)  1. CV - S/p NSTEMI. SR with HR in the 70's this am. On Coreg 6.25 mg bid, Plavix 75 mg daily, baby ec asa. BP climbing this morning and was not on any other agents at home. May need additional ACEI/ARB.  2.  Pulmonary - On room air. PA/LAT CXR shows no pneumothorax, small b/l pleural effusions, and improved bibasilar atelectasis.Encourage incentive spirometer. 3. Volume Overload - On Lasix 40 mg daily, weight continues to trend down. 4.  Expected post op acute blood loss anemia - H and H this am stable at 10.8 and 33.1 5. Creatinine decreased from 1.39 to 1.07. 6. Thrombocytopenia resolved-platelets this am up to 165,000  Plan: Discharge instructions discussed with the patient and all questions answered. Will discharge patient today if Dr. Tyrone Sage agrees.    LOS: 11 days    Sharlene Dory 07/13/2020  Will need aggressive medical management, statin and bp meds   Plan home today - follow up to be arranged with Dr Jens Som for cardiology Likely will need additional bp control- ? Ace/arb Plan home today  I have  seen and examined Leonard Donovan and agree with the above assessment  and plan.  Delight Ovens MD Beeper [phone] Office [phone] 07/13/2020 8:20 AM

## 2020-07-13 NOTE — Progress Notes (Signed)
Progress Note  Patient Name: Leonard Donovan Date of Encounter: 07/13/2020  Primary Cardiologist: Tobias Alexander, MD   Subjective   Feels well, expected chest soreness but no angina or SOB.   Inpatient Medications    Scheduled Meds: . acetaminophen  1,000 mg Oral Q6H  . aspirin EC  81 mg Oral Daily  . atorvastatin  80 mg Oral Daily  . carvedilol  6.25 mg Oral BID WC  . Chlorhexidine Gluconate Cloth  6 each Topical Daily  . clopidogrel  75 mg Oral Daily  . pantoprazole  40 mg Oral Daily  . sodium chloride flush  3 mL Intravenous Q12H   Continuous Infusions:  PRN Meds: metoprolol tartrate, ondansetron (ZOFRAN) IV, oxyCODONE, sodium chloride flush, traMADol   Vital Signs    Vitals:   07/13/20 0231 07/13/20 0443 07/13/20 0852 07/13/20 1245  BP: (!) 147/91 (!) 160/98 (!) 165/103 128/78  Pulse: 68 75 94 77  Resp: 20 19 20 18   Temp: 98.6 F (37 C) 97.8 F (36.6 C) 97.7 F (36.5 C) 98.5 F (36.9 C)  TempSrc: Oral Oral Oral Oral  SpO2: 98% 98%  95%  Weight:      Height:        Intake/Output Summary (Last 24 hours) at 07/13/2020 1301 Last data filed at 07/13/2020 0232 Gross per 24 hour  Intake 460 ml  Output -  Net 460 ml   Filed Weights   07/10/20 0500 07/11/20 0500 07/12/20 0430  Weight: 98.9 kg 98.4 kg 97.2 kg    Telemetry    SR- Personally Reviewed  ECG    No new - Personally Reviewed  Physical Exam   GEN: No acute distress.   Neck: No JVD Cardiac: regular rhythm, normal rate, no murmurs, rubs, or gallops. Chest incision healing Respiratory: Clear to auscultation bilaterally. GI: Soft, nontender, non-distended  MS: No edema; No deformity. Neuro:  Nonfocal  Psych: Normal affect   Labs    Chemistry Recent Labs  Lab 07/07/20 0347 07/08/20 0354 07/10/20 0531 07/11/20 0339 07/12/20 0204  NA 141   < > 132* 136 138  K 3.6   < > 4.0 3.6 4.1  CL 109   < > 99 104 106  CO2 23   < > 24 25 22   GLUCOSE 90   < > 128* 92 100*  BUN 15   < > 30*  30* 30*  CREATININE 1.00   < > 1.49* 1.39* 1.07  CALCIUM 8.8*   < > 8.4* 8.0* 8.0*  PROT 6.1*  --   --   --   --   ALBUMIN 3.0*  --   --   --   --   AST 26  --   --   --   --   ALT 19  --   --   --   --   ALKPHOS 76  --   --   --   --   BILITOT 0.9  --   --   --   --   GFRNONAA >60   < > 56* >60 >60  ANIONGAP 9   < > 9 7 10    < > = values in this interval not displayed.     Hematology Recent Labs  Lab 07/10/20 0531 07/11/20 0339 07/12/20 0204  WBC 15.1* 9.9 8.1  RBC 4.23 3.62* 3.76*  HGB 12.1* 10.5* 10.8*  HCT 37.7* 32.0* 33.1*  MCV 89.1 88.4 88.0  MCH 28.6 29.0 28.7  MCHC 32.1 32.8 32.6  RDW 14.1 14.1 14.2  PLT 176 137* 165    Cardiac EnzymesNo results for input(s): TROPONINI in the last 168 hours. No results for input(s): TROPIPOC in the last 168 hours.   BNPNo results for input(s): BNP, PROBNP in the last 168 hours.   DDimer No results for input(s): DDIMER in the last 168 hours.   Radiology    DG Chest 2 View  Result Date: 07/12/2020 CLINICAL DATA:  Status post CABG. EXAM: CHEST - 2 VIEW COMPARISON:  Chest x-ray from yesterday. FINDINGS: Interval removal of the right internal jugular sheath. Stable cardiomegaly status post CABG. Normal pulmonary vascularity. Slightly improved mild bibasilar atelectasis. Unchanged small bilateral pleural effusions. No pneumothorax. No acute osseous abnormality. IMPRESSION: 1. Slightly improved bibasilar atelectasis. Unchanged small bilateral pleural effusions. Electronically Signed   By: Obie Dredge M.D.   On: 07/12/2020 07:18     Patient Profile    55 y.o. male with a PMH of HTN, glaucoma, and recent COVID-19 infection (diagnosed on 06/18/20), with no prior cardiac history, though had not followed with a medical provider in about 20 years, who presented with chest pain, found to have an NSTEMI.  Now status post CABG.   Assessment & Plan   Active Problems:   Hypertensive emergency   NSTEMI (non-ST elevated myocardial  infarction) (HCC)   Coronary artery disease   S/P CABG x 4   HTN -  ACE-I/ARB: will start losartan 25 mg tonight, labs in 7-10 days and f/u arranged 07/31/20.  BB: carvedilol 6.25 mg BID CCB: stopped in hospital due to hypotension post bypass but could add back at follow up as needed.   NSTEMI, CAD s/p CABG -asa and plavix for 12 months -ASA is indefinite therapy - BB, statin on board  Volume overload - improving per review of chart, lasix duration per CV surgery. Unlikely to need ongoing therapy.    Total time of encounter: 35 minutes total time of encounter, including 20 minutes spent in face-to-face patient care on the date of this encounter. This time includes coordination of care and counseling regarding above mentioned problem list. Remainder of non-face-to-face time involved reviewing chart documents/testing relevant to the patient encounter and documentation in the medical record. Weston Brass, MD [facility]   For questions or updates, please contact [facility] Please consult [email or web address] for contact info under        Signed, Parke Poisson, MD  07/13/2020, 1:01 PM

## 2020-07-13 NOTE — Progress Notes (Signed)
Spoke with PA regarding prescription. Patient notified prescriptions have been fixed at pharmacy. Wife present during discharge instructions. Volunteers used wheelchair to take patient to vehicle.  Kenard Gower, RN

## 2020-07-13 NOTE — Discharge Instructions (Signed)
Discharge Instructions:  1. You may shower, please wash incisions daily with soap and water and keep dry.  If you wish to cover wounds with dressing you may do so but please keep clean and change daily.  No tub baths or swimming until incisions have completely healed.  If your incisions become red or develop any drainage please call our office at [phone]  2. No Driving until cleared by Dr. Dennie Maizes office and you are no longer using narcotic pain medications  3. Monitor your weight daily.. Please use the same scale and weigh at same time... If you gain 3-5 lbs in 48 hours with associated lower extremity swelling, please contact our office at [phone]  4. Fever of 101.5 for at least 24 hours with no source, please contact our office at [phone]    You will follow up with Dr. Lindaann Slough PA first then we will arrange follow up with Dr. Delton See- the PA is at [place] address  Have blood done on 07/20/20 thanks Prediabetes Eating Plan Prediabetes is a condition that causes blood sugar (glucose) levels to be higher than normal. This increases the risk for developing type 2 diabetes (type 2 diabetes mellitus). Working with a health care provider or nutrition specialist (dietitian) to make diet and lifestyle changes can help prevent the onset of diabetes. These changes may help you:  Control your blood glucose levels.  Improve your cholesterol levels.  Manage your blood pressure. What are tips for following this plan? Reading food labels  Read food labels to check the amount of fat, salt (sodium), and sugar in prepackaged foods. Avoid foods that have: ? Saturated fats. ? Trans fats. ? Added sugars.  Avoid foods that have more than 300 milligrams (mg) of sodium per serving. Limit your sodium intake to less than 2,300 mg each day. Shopping  Avoid buying pre-made and processed foods.  Avoid buying drinks with added sugar. Cooking  Cook with olive oil. Do not use butter, lard,  or ghee.  Bake, broil, grill, steam, or boil foods. Avoid frying. Meal planning  Work with your dietitian to create an eating plan that is right for you. This may include tracking how many calories you take in each day. Use a food diary, notebook, or mobile application to track what you eat at each meal.  Consider following a Mediterranean diet. This includes: ? Eating several servings of fresh fruits and vegetables each day. ? Eating fish at least twice a week. ? Eating one serving each day of whole grains, beans, nuts, and seeds. ? Using olive oil instead of other fats. ? Limiting alcohol. ? Limiting red meat. ? Using nonfat or low-fat dairy products.  Consider following a plant-based diet. This includes dietary choices that focus on eating mostly vegetables and fruit, grains, beans, nuts, and seeds.  If you have high blood pressure, you may need to limit your sodium intake or follow a diet such as the DASH (Dietary Approaches to Stop Hypertension) eating plan. The DASH diet aims to lower high blood pressure.   Lifestyle  Set weight loss goals with help from your health care team. It is recommended that most people with prediabetes lose 7% of their body weight.  Exercise for at least 30 minutes 5 or more days a week.  Attend a support group or seek support from a mental health counselor.  Take over-the-counter and prescription medicines only as told by your health care provider. What foods are recommended? Fruits Berries. Bananas. Apples. Oranges. Grapes. Papaya.  Mango. Pomegranate. Kiwi. Grapefruit. Cherries. Vegetables Lettuce. Spinach. Peas. Beets. Cauliflower. Cabbage. Broccoli. Carrots. Tomatoes. Squash. Eggplant. Herbs. Peppers. Onions. Cucumbers. Brussels sprouts. Grains Whole grains, such as whole-wheat or whole-grain breads, crackers, cereals, and pasta. Unsweetened oatmeal. Bulgur. Barley. Quinoa. Brown rice. Corn or whole-wheat flour tortillas or taco shells. Meats and  other proteins Seafood. Poultry without skin. Lean cuts of pork and beef. Tofu. Eggs. Nuts. Beans. Dairy Low-fat or fat-free dairy products, such as yogurt, cottage cheese, and cheese. Beverages Water. Tea. Coffee. Sugar-free or diet soda. Seltzer water. Low-fat or nonfat milk. Milk alternatives, such as soy or almond milk. Fats and oils Olive oil. Canola oil. Sunflower oil. Grapeseed oil. Avocado. Walnuts. Sweets and desserts Sugar-free or low-fat pudding. Sugar-free or low-fat ice cream and other frozen treats. Seasonings and condiments Herbs. Sodium-free spices. Mustard. Relish. Low-salt, low-sugar ketchup. Low-salt, low-sugar barbecue sauce. Low-fat or fat-free mayonnaise. The items listed above may not be a complete list of recommended foods and beverages. Contact a dietitian for more information. What foods are not recommended? Fruits Fruits canned with syrup. Vegetables Canned vegetables. Frozen vegetables with butter or cream sauce. Grains Refined white flour and flour products, such as bread, pasta, snack foods, and cereals. Meats and other proteins Fatty cuts of meat. Poultry with skin. Breaded or fried meat. Processed meats. Dairy Full-fat yogurt, cheese, or milk. Beverages Sweetened drinks, such as iced tea and soda. Fats and oils Butter. Lard. Ghee. Sweets and desserts Baked goods, such as cake, cupcakes, pastries, cookies, and cheesecake. Seasonings and condiments Spice mixes with added salt. Ketchup. Barbecue sauce. Mayonnaise. The items listed above may not be a complete list of foods and beverages that are not recommended. Contact a dietitian for more information. Where to find more information  American Diabetes Association: www.diabetes.org Summary  You may need to make diet and lifestyle changes to help prevent the onset of diabetes. These changes can help you control blood sugar, improve cholesterol levels, and manage blood pressure.  Set weight loss goals  with help from your health care team. It is recommended that most people with prediabetes lose 7% of their body weight.  Consider following a Mediterranean diet. This includes eating plenty of fresh fruits and vegetables, whole grains, beans, nuts, seeds, fish, and low-fat dairy, and using olive oil instead of other fats. This information is not intended to replace advice given to you by your health care provider. Make sure you discuss any questions you have with your health care provider. Document Revised: 08/15/2019 Document Reviewed: 08/15/2019 Elsevier Patient Education  2021 Elsevier Inc.   5. Activity- up as tolerated, please walk at least 3 times per day.  Avoid strenuous activity, no lifting, pushing, or pulling with your arms over 8-10 lbs for a minimum of 6 weeks  6. If any questions or concerns arise, please do not hesitate to contact our office at [phone]

## 2020-07-13 NOTE — Progress Notes (Signed)
Mobility Specialist - Progress Note   07/13/20 1129  Mobility  Activity Ambulated in hall  Level of Assistance Independent  Assistive Device None  Distance Ambulated (ft) 500 ft  Mobility Response Tolerated well  Mobility performed by Mobility specialist  $Mobility charge 1 Mobility   Pt asx throughout ambulation. VSS. Pt to recliner after walk.   Mamie Levers Mobility Specialist Mobility Specialist Phone: [phone]

## 2020-07-15 ENCOUNTER — Telehealth (HOSPITAL_COMMUNITY): Payer: Self-pay

## 2020-07-15 NOTE — Telephone Encounter (Signed)
Per phase I, fax cardiac rehab referral to [facility] cardiac rehab.

## 2020-07-16 ENCOUNTER — Telehealth: Payer: Self-pay

## 2020-07-16 NOTE — Telephone Encounter (Signed)
FMLA form completed and faxed to [phone] Beginning leave 07/01/20, date of surgery 07/08/20, approx return to work date 10/05/20 Form mail to home address.

## 2020-07-20 ENCOUNTER — Other Ambulatory Visit: Payer: BC Managed Care – PPO | Admitting: *Deleted

## 2020-07-20 ENCOUNTER — Emergency Department (HOSPITAL_COMMUNITY): Payer: BC Managed Care – PPO

## 2020-07-20 ENCOUNTER — Inpatient Hospital Stay (HOSPITAL_COMMUNITY): Payer: BC Managed Care – PPO

## 2020-07-20 ENCOUNTER — Encounter (HOSPITAL_COMMUNITY): Payer: Self-pay

## 2020-07-20 ENCOUNTER — Observation Stay (HOSPITAL_COMMUNITY)
Admission: EM | Admit: 2020-07-20 | Discharge: 2020-07-22 | Disposition: A | Discharge status: Home / Self Care | Payer: BC Managed Care – PPO | Attending: Family Medicine | Admitting: Family Medicine

## 2020-07-20 ENCOUNTER — Other Ambulatory Visit: Payer: Self-pay

## 2020-07-20 DIAGNOSIS — R471 Dysarthria and anarthria: Secondary | ICD-10-CM | POA: Diagnosis present

## 2020-07-20 DIAGNOSIS — Z7902 Long term (current) use of antithrombotics/antiplatelets: Secondary | ICD-10-CM | POA: Diagnosis not present

## 2020-07-20 DIAGNOSIS — K5900 Constipation, unspecified: Secondary | ICD-10-CM | POA: Diagnosis present

## 2020-07-20 DIAGNOSIS — I6350 Cerebral infarction due to unspecified occlusion or stenosis of unspecified cerebral artery: Principal | ICD-10-CM | POA: Insufficient documentation

## 2020-07-20 DIAGNOSIS — I252 Old myocardial infarction: Secondary | ICD-10-CM | POA: Insufficient documentation

## 2020-07-20 DIAGNOSIS — R29810 Facial weakness: Secondary | ICD-10-CM | POA: Diagnosis present

## 2020-07-20 DIAGNOSIS — H409 Unspecified glaucoma: Secondary | ICD-10-CM | POA: Insufficient documentation

## 2020-07-20 DIAGNOSIS — R4781 Slurred speech: Secondary | ICD-10-CM | POA: Diagnosis present

## 2020-07-20 DIAGNOSIS — J90 Pleural effusion, not elsewhere classified: Secondary | ICD-10-CM | POA: Diagnosis present

## 2020-07-20 DIAGNOSIS — I083 Combined rheumatic disorders of mitral, aortic and tricuspid valves: Secondary | ICD-10-CM | POA: Diagnosis present

## 2020-07-20 DIAGNOSIS — G8194 Hemiplegia, unspecified affecting left nondominant side: Secondary | ICD-10-CM | POA: Diagnosis present

## 2020-07-20 DIAGNOSIS — I6329 Cerebral infarction due to unspecified occlusion or stenosis of other precerebral arteries: Secondary | ICD-10-CM | POA: Diagnosis present

## 2020-07-20 DIAGNOSIS — Z8249 Family history of ischemic heart disease and other diseases of the circulatory system: Secondary | ICD-10-CM | POA: Diagnosis not present

## 2020-07-20 DIAGNOSIS — Z87891 Personal history of nicotine dependence: Secondary | ICD-10-CM | POA: Diagnosis not present

## 2020-07-20 DIAGNOSIS — I6340 Cerebral infarction due to embolism of unspecified cerebral artery: Principal | ICD-10-CM | POA: Diagnosis present

## 2020-07-20 DIAGNOSIS — I639 Cerebral infarction, unspecified: Principal | ICD-10-CM | POA: Diagnosis present

## 2020-07-20 DIAGNOSIS — Z7982 Long term (current) use of aspirin: Secondary | ICD-10-CM | POA: Diagnosis not present

## 2020-07-20 DIAGNOSIS — I6523 Occlusion and stenosis of bilateral carotid arteries: Secondary | ICD-10-CM | POA: Diagnosis present

## 2020-07-20 DIAGNOSIS — N179 Acute kidney failure, unspecified: Secondary | ICD-10-CM | POA: Diagnosis present

## 2020-07-20 DIAGNOSIS — Z951 Presence of aortocoronary bypass graft: Secondary | ICD-10-CM | POA: Insufficient documentation

## 2020-07-20 DIAGNOSIS — I10 Essential (primary) hypertension: Secondary | ICD-10-CM | POA: Diagnosis not present

## 2020-07-20 DIAGNOSIS — Z8616 Personal history of COVID-19: Secondary | ICD-10-CM | POA: Diagnosis not present

## 2020-07-20 DIAGNOSIS — E876 Hypokalemia: Secondary | ICD-10-CM

## 2020-07-20 DIAGNOSIS — I6389 Other cerebral infarction: Secondary | ICD-10-CM | POA: Diagnosis not present

## 2020-07-20 DIAGNOSIS — Z79899 Other long term (current) drug therapy: Secondary | ICD-10-CM | POA: Diagnosis not present

## 2020-07-20 DIAGNOSIS — I2510 Atherosclerotic heart disease of native coronary artery without angina pectoris: Secondary | ICD-10-CM | POA: Insufficient documentation

## 2020-07-20 LAB — I-STAT CHEM 8, ED
BUN: 30 mg/dL — ABNORMAL HIGH (ref 6–20)
Calcium, Ion: 1.21 mmol/L (ref 1.15–1.40)
Chloride: 102 mmol/L (ref 98–111)
Creatinine, Ser: 1.4 mg/dL — ABNORMAL HIGH (ref 0.61–1.24)
Glucose, Bld: 102 mg/dL — ABNORMAL HIGH (ref 70–99)
HCT: 37 % — ABNORMAL LOW (ref 39.0–52.0)
Hemoglobin: 12.6 g/dL — ABNORMAL LOW (ref 13.0–17.0)
Potassium: 4.6 mmol/L (ref 3.5–5.1)
Sodium: 138 mmol/L (ref 135–145)
TCO2: 25 mmol/L (ref 22–32)

## 2020-07-20 LAB — COMPREHENSIVE METABOLIC PANEL
ALT: 17 U/L (ref 0–44)
AST: 20 U/L (ref 15–41)
Albumin: 3.3 g/dL — ABNORMAL LOW (ref 3.5–5.0)
Alkaline Phosphatase: 90 U/L (ref 38–126)
Anion gap: 9 (ref 5–15)
BUN: 27 mg/dL — ABNORMAL HIGH (ref 6–20)
CO2: 26 mmol/L (ref 22–32)
Calcium: 9.2 mg/dL (ref 8.9–10.3)
Chloride: 102 mmol/L (ref 98–111)
Creatinine, Ser: 1.38 mg/dL — ABNORMAL HIGH (ref 0.61–1.24)
GFR, Estimated: >60 mL/min (ref >60)
Glucose, Bld: 105 mg/dL — ABNORMAL HIGH (ref 70–99)
Potassium: 4.7 mmol/L (ref 3.5–5.1)
Sodium: 137 mmol/L (ref 135–145)
Total Bilirubin: 0.9 mg/dL (ref 0.3–1.2)
Total Protein: 7 g/dL (ref 6.5–8.1)

## 2020-07-20 LAB — DIFFERENTIAL
Abs Immature Granulocytes: 0.11 10*3/uL — ABNORMAL HIGH (ref 0.00–0.07)
Basophils Absolute: 0.1 10*3/uL (ref 0.0–0.1)
Basophils Relative: 1 %
Eosinophils Absolute: 0.3 10*3/uL (ref 0.0–0.5)
Eosinophils Relative: 3 %
Immature Granulocytes: 1 %
Lymphocytes Relative: 19 %
Lymphs Abs: 1.9 10*3/uL (ref 0.7–4.0)
Monocytes Absolute: 0.7 10*3/uL (ref 0.1–1.0)
Monocytes Relative: 7 %
Neutro Abs: 6.9 10*3/uL (ref 1.7–7.7)
Neutrophils Relative %: 69 %

## 2020-07-20 LAB — BASIC METABOLIC PANEL
BUN/Creatinine Ratio: 20 (ref 9–20)
BUN: 24 mg/dL (ref 6–24)
CO2: 23 mmol/L (ref 20–29)
Calcium: 9.2 mg/dL (ref 8.7–10.2)
Chloride: 102 mmol/L (ref 96–106)
Creatinine, Ser: 1.22 mg/dL (ref 0.76–1.27)
GFR calc Af Amer: 78 mL/min/{1.73_m2} (ref >59)
GFR calc non Af Amer: 68 mL/min/{1.73_m2} (ref >59)
Glucose: 113 mg/dL — ABNORMAL HIGH (ref 65–99)
Potassium: 5.1 mmol/L (ref 3.5–5.2)
Sodium: 140 mmol/L (ref 134–144)

## 2020-07-20 LAB — CBC
HCT: 38.3 % — ABNORMAL LOW (ref 39.0–52.0)
Hemoglobin: 12.1 g/dL — ABNORMAL LOW (ref 13.0–17.0)
MCH: 28.5 pg (ref 26.0–34.0)
MCHC: 31.6 g/dL (ref 30.0–36.0)
MCV: 90.1 fL (ref 80.0–100.0)
Platelets: 363 10*3/uL (ref 150–400)
RBC: 4.25 MIL/uL (ref 4.22–5.81)
RDW: 14.2 % (ref 11.5–15.5)
WBC: 10 10*3/uL (ref 4.0–10.5)
nRBC: 0 % (ref 0.0–0.2)

## 2020-07-20 LAB — APTT: aPTT: 33 seconds (ref 24–36)

## 2020-07-20 LAB — PROTIME-INR
INR: 1.1 (ref 0.8–1.2)
Prothrombin Time: 14.2 seconds (ref 11.4–15.2)

## 2020-07-20 MED ORDER — CLOPIDOGREL BISULFATE 75 MG PO TABS
75.0000 mg | ORAL_TABLET | Freq: Every day | ORAL | Status: DC
  Administered 2020-07-21 – 2020-07-22 (×2): 75 mg via ORAL
  Filled 2020-07-20 (×2): qty 1

## 2020-07-20 MED ORDER — ATROPINE SULFATE 1 MG/10ML IJ SOSY: 0.5000 mg | PREFILLED_SYRINGE | INTRAMUSCULAR | Status: DC | PRN

## 2020-07-20 MED ORDER — SODIUM CHLORIDE 0.9% FLUSH
3.0000 mL | Freq: Once | INTRAVENOUS | Status: AC
Start: 2020-07-20 — End: 2020-07-20
  Administered 2020-07-20: 3 mL via INTRAVENOUS

## 2020-07-20 MED ORDER — GLUCAGON HCL RDNA (DIAGNOSTIC) 1 MG IJ SOLR: 1.0000 mg | Freq: Once | INTRAMUSCULAR | Status: DC

## 2020-07-20 MED ORDER — STROKE: EARLY STAGES OF RECOVERY BOOK: Freq: Once | Status: AC

## 2020-07-20 MED ORDER — ASPIRIN EC 81 MG PO TBEC
81.0000 mg | DELAYED_RELEASE_TABLET | Freq: Every day | ORAL | Status: DC
  Administered 2020-07-21 – 2020-07-22 (×2): 81 mg via ORAL
  Filled 2020-07-20 (×2): qty 1

## 2020-07-20 MED ORDER — LORAZEPAM 2 MG/ML IJ SOLN: 1.0000 mg | Freq: Once | INTRAMUSCULAR | Status: DC | PRN

## 2020-07-20 MED ORDER — ENOXAPARIN SODIUM 40 MG/0.4ML ~~LOC~~ SOLN
40.0000 mg | SUBCUTANEOUS | Status: DC
  Administered 2020-07-21 – 2020-07-22 (×2): 40 mg via SUBCUTANEOUS
  Filled 2020-07-20 (×2): qty 0.4

## 2020-07-20 MED ORDER — IOHEXOL 350 MG/ML SOLN
80.0000 mL | Freq: Once | INTRAVENOUS | Status: AC | PRN
  Administered 2020-07-20: 80 mL via INTRAVENOUS

## 2020-07-20 MED ORDER — ATORVASTATIN CALCIUM 80 MG PO TABS
80.0000 mg | ORAL_TABLET | Freq: Every day | ORAL | Status: DC
  Administered 2020-07-21 – 2020-07-22 (×2): 80 mg via ORAL
  Filled 2020-07-20: qty 8
  Filled 2020-07-20: qty 1

## 2020-07-20 NOTE — ED Provider Notes (Signed)
[facility] EMERGENCY DEPARTMENT Provider Note   CSN: [id number] Arrival date & time: 07/20/20  1920     History Chief Complaint  Patient presents with  . Code Stroke    Leonard Donovan is a 55 y.o. male.  The history is provided by the patient, the spouse and medical records.   Leonard Donovan is a 56 y.o. male who presents to the Emergency Department complaining of slurred speech for a few days. History is provided by the patient and his wife. He presents to the emergency department for slurred speech that started several days ago. His wife thought it might be secondary to his pain medications and these were stopped yesterday. This morning she started to notice that the left side of his face was drooping and tonight it appeared to be dripping more.Marland Kitchen He recently had a COVID-19 infection complicated by hypertensive urgency and diagnosis of coronary artery disease requiring CABG. He was recently discharged from the hospital. Symptoms are severe, constant, worsening.    Past Medical History:  Diagnosis Date  . Glaucoma   . Hypertension     Patient Active Problem List   Diagnosis Date Noted  . Acute CVA (cerebrovascular accident) (HCC) 07/20/2020  . Glaucoma   . Hypertension   . S/P CABG x 4   . Coronary artery disease 07/08/2020  . NSTEMI (non-ST elevated myocardial infarction) (HCC) 07/02/2020  . Hypertensive emergency 07/01/2020    Past Surgical History:  Procedure Laterality Date  . CORONARY ARTERY BYPASS GRAFT N/A 07/08/2020   Procedure: CORONARY ARTERY BYPASS GRAFTING (CABG), ON PUMP, TIMES FOUR, LEFT INTERNAL MAMMARY ARTERY TO LAD, RIGHT SVG TO PDA, DISTAL CIRCUMFLEX, AND OM1;  Surgeon: Delight Ovens, MD;  Location: [facility] OR;  Service: Open Heart Surgery;  Laterality: N/A;  . ENDOVEIN HARVEST OF GREATER SAPHENOUS VEIN Right 07/08/2020   Procedure: ENDOVEIN HARVEST OF RIGHT GREATER SAPHENOUS VEIN;  Surgeon: Delight Ovens, MD;  Location: [facility] OR;   Service: Open Heart Surgery;  Laterality: Right;  . LEFT HEART CATH AND CORONARY ANGIOGRAPHY N/A 07/02/2020   Procedure: LEFT HEART CATH AND CORONARY ANGIOGRAPHY;  Surgeon: Corky Crafts, MD;  Location: [facility] INVASIVE CV LAB;  Service: Cardiovascular;  Laterality: N/A;  . NO PAST SURGERIES    . TEE WITHOUT CARDIOVERSION N/A 07/08/2020   Procedure: TRANSESOPHAGEAL ECHOCARDIOGRAM (TEE);  Surgeon: Delight Ovens, MD;  Location: [facility] OR;  Service: Open Heart Surgery;  Laterality: N/A;       Family History  Problem Relation Age of Onset  . Hypertension Father   . CAD Sister        diagnosed in her 38s  . Heart attack Sister     Social History   Tobacco Use  . Smoking status: Former Games developer  . Smokeless tobacco: Former Clinical biochemist  . Vaping Use: Never used  Substance Use Topics  . Alcohol use: Not Currently  . Drug use: Not Currently    Home Medications Prior to Admission medications   Medication Sig Start Date End Date Taking? Authorizing Provider  acetaminophen (TYLENOL) 500 MG tablet Take 1,000 mg by mouth every 6 (six) hours as needed for mild pain (or discomfort).   Yes [provider]  aspirin EC 81 MG EC tablet Take 1 tablet (81 mg total) by mouth daily. Swallow whole. 07/11/20  Yes Doree Fudge M, PA-C  atorvastatin (LIPITOR) 80 MG tablet Take 1 tablet (80 mg total) by mouth daily. 07/13/20  Yes Asa Lente,  Tessa N, PA-C  carvedilol (COREG) 6.25 MG tablet Take 1 tablet (6.25 mg total) by mouth 2 (two) times daily with a meal. 07/13/20  Yes Asa Lenteonte, Tessa N, PA-C  clopidogrel (PLAVIX) 75 MG tablet Take 1 tablet (75 mg total) by mouth daily. 07/13/20  Yes Conte, Tessa N, PA-C  losartan (COZAAR) 25 MG tablet Take 1 tablet (25 mg total) by mouth every evening. 07/13/20  Yes Conte, Tessa N, PA-C  Multiple Vitamins-Minerals (MULTIVITAMIN WITH MINERALS) tablet Take 1 tablet by mouth daily.   Yes [provider]  traMADol (ULTRAM) 50 MG tablet Take 1 tablet (50 mg  total) by mouth every 6 (six) hours as needed for up to 7 days for moderate pain. 07/13/20 07/20/20 Yes Rowe ClackGold, Wayne E, PA-C    Allergies    Patient has no known allergies.  Review of Systems   Review of Systems  All other systems reviewed and are negative.   Physical Exam Updated Vital Signs BP (!) 148/87   Pulse 63   Resp (!) 22   SpO2 99%   Physical Exam Vitals and nursing note reviewed.  Constitutional:      Appearance: He is well-developed and well-nourished.  HENT:     Head: Normocephalic and atraumatic.  Cardiovascular:     Rate and Rhythm: Normal rate and regular rhythm.     Heart sounds: No murmur heard.   Pulmonary:     Effort: Pulmonary effort is normal. No respiratory distress.     Breath sounds: Normal breath sounds.     Comments: Sternotomy wound c/d/i Abdominal:     Palpations: Abdomen is soft.     Tenderness: There is no abdominal tenderness. There is no guarding or rebound.  Musculoskeletal:        General: No tenderness or edema.  Skin:    General: Skin is warm and dry.  Neurological:     Mental Status: He is alert and oriented to person, place, and time.     Comments: Mildly slurred speech. Left lower facial droop. Visual fields grossly intact. Five out of five strength in all four extremities.  Psychiatric:        Mood and Affect: Mood and affect normal.        Behavior: Behavior normal.     ED Results / Procedures / Treatments   Labs (all labs ordered are listed, but only abnormal results are displayed) Labs Reviewed  CBC - Abnormal; Notable for the following components:      Result Value   Hemoglobin 12.1 (*)    HCT 38.3 (*)    All other components within normal limits  DIFFERENTIAL - Abnormal; Notable for the following components:   Abs Immature Granulocytes 0.11 (*)    All other components within normal limits  COMPREHENSIVE METABOLIC PANEL - Abnormal; Notable for the following components:   Glucose, Bld 105 (*)    BUN 27 (*)     Creatinine, Ser 1.38 (*)    Albumin 3.3 (*)    All other components within normal limits  I-STAT CHEM 8, ED - Abnormal; Notable for the following components:   BUN 30 (*)    Creatinine, Ser 1.40 (*)    Glucose, Bld 102 (*)    Hemoglobin 12.6 (*)    HCT 37.0 (*)    All other components within normal limits  PROTIME-INR  APTT  HEMOGLOBIN A1C  LIPID PANEL  HIV ANTIBODY (ROUTINE TESTING W REFLEX)  CBG MONITORING, ED    EKG None  Radiology CT Code Stroke CTA Head W/WO contrast  Result Date: 07/20/2020 CLINICAL DATA:  Left facial droop and slurred speech EXAM: CT ANGIOGRAPHY HEAD AND NECK TECHNIQUE: Multidetector CT imaging of the head and neck was performed using the standard protocol during bolus administration of intravenous contrast. Multiplanar CT image reconstructions and MIPs were obtained to evaluate the vascular anatomy. Carotid stenosis measurements (when applicable) are obtained utilizing NASCET criteria, using the distal internal carotid diameter as the denominator. CONTRAST:  10mL OMNIPAQUE IOHEXOL 350 MG/ML SOLN COMPARISON:  None. FINDINGS: CTA NECK FINDINGS SKELETON: There is no bony spinal canal stenosis. No lytic or blastic lesion. OTHER NECK: Normal pharynx, larynx and major salivary glands. No cervical lymphadenopathy. Unremarkable thyroid gland. UPPER CHEST: Small left pleural effusion. AORTIC ARCH: There is no calcific atherosclerosis of the aortic arch. There is no aneurysm, dissection or hemodynamically significant stenosis of the visualized portion of the aorta. Conventional 3 vessel aortic branching pattern. The visualized proximal subclavian arteries are widely patent. RIGHT CAROTID SYSTEM: No dissection, occlusion or aneurysm. There is low density atherosclerosis extending into the proximal ICA, resulting in less than 50% stenosis. LEFT CAROTID SYSTEM: No dissection, occlusion or aneurysm. Mild atherosclerotic calcification at the carotid bifurcation without  hemodynamically significant stenosis. VERTEBRAL ARTERIES: Codominant configuration. Both origins are clearly patent. There is no dissection, occlusion or flow-limiting stenosis to the skull base (V1-V3 segments). CTA HEAD FINDINGS POSTERIOR CIRCULATION: --Vertebral arteries: Normal V4 segments. --Inferior cerebellar arteries: Normal. --Basilar artery: Normal. --Superior cerebellar arteries: Normal. --Posterior cerebral arteries (PCA): Normal. ANTERIOR CIRCULATION: --Intracranial internal carotid arteries: Normal. --Anterior cerebral arteries (ACA): Normal. Both A1 segments are present. Patent anterior communicating artery (a-comm). --Middle cerebral arteries (MCA): Normal. VENOUS SINUSES: As permitted by contrast timing, patent. ANATOMIC VARIANTS: None Review of the MIP images confirms the above findings. IMPRESSION: 1. No intracranial arterial occlusion or high-grade stenosis. 2. Bilateral carotid bifurcation atherosclerosis without hemodynamically significant stenosis by NASCET criteria. 3. Small left pleural effusion. Electronically Signed   By: Deatra Robinson M.D.   On: 07/20/2020 23:38   CT Head Wo Contrast  Result Date: 07/20/2020 CLINICAL DATA:  Left-sided weakness with slurred speech EXAM: CT HEAD WITHOUT CONTRAST TECHNIQUE: Contiguous axial images were obtained from the base of the skull through the vertex without intravenous contrast. COMPARISON:  None. FINDINGS: Brain: No hemorrhage or intracranial mass. Small focus of hypodensity in the right white matter. Normal ventricle size. Vascular: No hyperdense vessels.  No unexpected calcification Skull: Normal. Negative for fracture or focal lesion. Sinuses/Orbits: Mucosal thickening or retention cysts in the maxillary sinuses and ethmoid sinuses Other: None IMPRESSION: 1. Small focus of hypodensity in the right white matter, may represent age indeterminate lacunar infarct or age indeterminate small vessel ischemic change. 2. Otherwise no CT evidence for  acute intracranial abnormality. Electronically Signed   By: Jasmine Pang M.D.   On: 07/20/2020 21:28   CT Code Stroke CTA Neck W/WO contrast  Result Date: 07/20/2020 CLINICAL DATA:  Left facial droop and slurred speech EXAM: CT ANGIOGRAPHY HEAD AND NECK TECHNIQUE: Multidetector CT imaging of the head and neck was performed using the standard protocol during bolus administration of intravenous contrast. Multiplanar CT image reconstructions and MIPs were obtained to evaluate the vascular anatomy. Carotid stenosis measurements (when applicable) are obtained utilizing NASCET criteria, using the distal internal carotid diameter as the denominator. CONTRAST:  75mL OMNIPAQUE IOHEXOL 350 MG/ML SOLN COMPARISON:  None. FINDINGS: CTA NECK FINDINGS SKELETON: There is no bony spinal canal stenosis. No lytic or blastic  lesion. OTHER NECK: Normal pharynx, larynx and major salivary glands. No cervical lymphadenopathy. Unremarkable thyroid gland. UPPER CHEST: Small left pleural effusion. AORTIC ARCH: There is no calcific atherosclerosis of the aortic arch. There is no aneurysm, dissection or hemodynamically significant stenosis of the visualized portion of the aorta. Conventional 3 vessel aortic branching pattern. The visualized proximal subclavian arteries are widely patent. RIGHT CAROTID SYSTEM: No dissection, occlusion or aneurysm. There is low density atherosclerosis extending into the proximal ICA, resulting in less than 50% stenosis. LEFT CAROTID SYSTEM: No dissection, occlusion or aneurysm. Mild atherosclerotic calcification at the carotid bifurcation without hemodynamically significant stenosis. VERTEBRAL ARTERIES: Codominant configuration. Both origins are clearly patent. There is no dissection, occlusion or flow-limiting stenosis to the skull base (V1-V3 segments). CTA HEAD FINDINGS POSTERIOR CIRCULATION: --Vertebral arteries: Normal V4 segments. --Inferior cerebellar arteries: Normal. --Basilar artery: Normal.  --Superior cerebellar arteries: Normal. --Posterior cerebral arteries (PCA): Normal. ANTERIOR CIRCULATION: --Intracranial internal carotid arteries: Normal. --Anterior cerebral arteries (ACA): Normal. Both A1 segments are present. Patent anterior communicating artery (a-comm). --Middle cerebral arteries (MCA): Normal. VENOUS SINUSES: As permitted by contrast timing, patent. ANATOMIC VARIANTS: None Review of the MIP images confirms the above findings. IMPRESSION: 1. No intracranial arterial occlusion or high-grade stenosis. 2. Bilateral carotid bifurcation atherosclerosis without hemodynamically significant stenosis by NASCET criteria. 3. Small left pleural effusion. Electronically Signed   By: Deatra Robinson M.D.   On: 07/20/2020 23:38    Procedures Procedures   Medications Ordered in ED Medications  LORazepam (ATIVAN) injection 1 mg (has no administration in time range)  enoxaparin (LOVENOX) injection 40 mg (has no administration in time range)  clopidogrel (PLAVIX) tablet 75 mg (has no administration in time range)  atorvastatin (LIPITOR) tablet 80 mg (has no administration in time range)  aspirin EC tablet 81 mg (has no administration in time range)  sodium chloride flush (NS) 0.9 % injection 3 mL (3 mLs Intravenous Given 07/20/20 2016)  iohexol (OMNIPAQUE) 350 MG/ML injection 80 mL (80 mLs Intravenous Contrast Given 07/20/20 2250)   stroke: mapping our early stages of recovery book ( Does not apply Given 07/20/20 2335)    ED Course  I have reviewed the triage vital signs and the nursing notes.  Pertinent labs & imaging results that were available during my care of the patient were reviewed by me and considered in my medical decision making (see chart for details).    MDM Rules/Calculators/A&P                         patient presented to the emergency department for several days of slurred speech and left facial droop. Code stroke was activated at triage. On assessment of patient code stroke  was canceled due to duration of symptoms. Exam and CT scan are consistent with CVA. He was evaluated by neurology in the emergency department. Plan to admit to the hospitalist service for ongoing treatment. Patient and wife are in agreement with plan.  Final Clinical Impression(s) / ED Diagnoses Final diagnoses:  Acute CVA (cerebrovascular accident) [facility])    Rx / DC Orders ED Discharge Orders    None       Tilden Fossa, MD 07/20/20 2353

## 2020-07-20 NOTE — H&P (Signed)
History and Physical    [name] Donovan:[id number] DOB: 1966-01-03 DOA: 07/20/2020  PCP: Patient, No Pcp Per   Patient coming from: [facility].  I have personally briefly reviewed patient's old medical records in [facility]  Chief Complaint: Slurred speech.  HPI: Leonard Donovan is a 55 y.o. male with medical history significant of glaucoma, hypertension, COVID-19 infection last month, former smoker, CAD, status post CABG on 07/08/2020 who is coming to the emergency department due to left facial droop, slurred speech and LUE.  His wife did not notice his symptoms because she thought it were due to his narcotic medications for pain.  However, she noticed that he still continues to have drooling and stop all the medications for the past 2 days and symptoms persisted.  He denies fever, sore throat, rhinorrhea, dyspnea, wheezing or hemoptysis.  No chest pain, palpitations, dizziness, diaphoresis, PND, orthopnea or pitting edema of the lower extremities.  No abdominal pain, nausea, emesis, diarrhea, constipation, no hematochezia.  No dysuria, frequency or hematuria.  No polyuria, polydipsia, polyphagia or blurred vision.  ED Course: Initial vital signs were temperature 98.4 F, pulse 62, respirations 14, BP 120/79 mmHg and O2 sat 97% on room air.  Labwork: CBC her white count of 10.0, mournful 0.1 g/dL and platelets 341.  CMP shows normal electrolytes.  Glucose 105, BUN 27 and creatinine 1.28 mg/dL.  LFTs are normal, except for an albumin of 3.3 g/dL.  Review of Systems: As per HPI otherwise all other systems reviewed and are negative.  Past Medical History:  Diagnosis Date  . Glaucoma   . Hypertension     Past Surgical History:  Procedure Laterality Date  . CORONARY ARTERY BYPASS GRAFT N/A 07/08/2020   Procedure: CORONARY ARTERY BYPASS GRAFTING (CABG), ON PUMP, TIMES FOUR, LEFT INTERNAL MAMMARY ARTERY TO LAD, RIGHT SVG TO PDA, DISTAL CIRCUMFLEX, AND OM1;  Surgeon: Delight Ovens, MD;   Location: [facility] OR;  Service: Open Heart Surgery;  Laterality: N/A;  . ENDOVEIN HARVEST OF GREATER SAPHENOUS VEIN Right 07/08/2020   Procedure: ENDOVEIN HARVEST OF RIGHT GREATER SAPHENOUS VEIN;  Surgeon: Delight Ovens, MD;  Location: [facility] OR;  Service: Open Heart Surgery;  Laterality: Right;  . LEFT HEART CATH AND CORONARY ANGIOGRAPHY N/A 07/02/2020   Procedure: LEFT HEART CATH AND CORONARY ANGIOGRAPHY;  Surgeon: Corky Crafts, MD;  Location: [facility] INVASIVE CV LAB;  Service: Cardiovascular;  Laterality: N/A;  . NO PAST SURGERIES    . TEE WITHOUT CARDIOVERSION N/A 07/08/2020   Procedure: TRANSESOPHAGEAL ECHOCARDIOGRAM (TEE);  Surgeon: Delight Ovens, MD;  Location: [facility] OR;  Service: Open Heart Surgery;  Laterality: N/A;    Social History  reports that he has quit smoking. He has quit using smokeless tobacco. He reports previous alcohol use. He reports previous drug use.  No Known Allergies  Family History  Problem Relation Age of Onset  . Hypertension Father   . CAD Sister        diagnosed in her 94s  . Heart attack Sister    Prior to Admission medications   Medication Sig Start Date End Date Taking? Authorizing Provider  acetaminophen (TYLENOL) 500 MG tablet Take 1,000 mg by mouth every 6 (six) hours as needed for mild pain (or discomfort).   Yes [provider]  aspirin EC 81 MG EC tablet Take 1 tablet (81 mg total) by mouth daily. Swallow whole. 07/11/20  Yes Doree Fudge M, PA-C  atorvastatin (LIPITOR) 80 MG tablet Take 1 tablet (  80 mg total) by mouth daily. 07/13/20  Yes Asa Lente, Tessa N, PA-C  carvedilol (COREG) 6.25 MG tablet Take 1 tablet (6.25 mg total) by mouth 2 (two) times daily with a meal. 07/13/20  Yes Asa Lente, Tessa N, PA-C  clopidogrel (PLAVIX) 75 MG tablet Take 1 tablet (75 mg total) by mouth daily. 07/13/20  Yes Conte, Tessa N, PA-C  losartan (COZAAR) 25 MG tablet Take 1 tablet (25 mg total) by mouth every evening. 07/13/20  Yes Conte, Tessa N, PA-C  Multiple  Vitamins-Minerals (MULTIVITAMIN WITH MINERALS) tablet Take 1 tablet by mouth daily.   Yes [provider]  traMADol (ULTRAM) 50 MG tablet Take 1 tablet (50 mg total) by mouth every 6 (six) hours as needed for up to 7 days for moderate pain. 07/13/20 07/20/20 Yes Rowe Clack, PA-C   Physical Exam: Vitals:   07/20/20 2015 07/20/20 2115 07/20/20 2230  BP: 120/79 138/74 (!) 148/87  Pulse: 62 (!) 59 63  Resp: 14 19 (!) 22  SpO2: 97% 99% 99%   Constitutional: NAD, calm, comfortable Eyes: PERRL, lids and conjunctivae normal ENMT: Mucous membranes are moist. Posterior pharynx clear of any exudate or lesions. Neck: normal, supple, no masses, no thyromegaly Respiratory: clear to auscultation bilaterally, no wheezing, no crackles. Normal respiratory effort. No accessory muscle use.  Cardiovascular: Regular rate and rhythm, no murmurs / rubs / gallops. No extremity edema. 2+ pedal pulses. No carotid bruits.  Abdomen: Obese, no distention.  Bowel sounds positive.  Soft, no tenderness, no masses palpated. No hepatosplenomegaly.   Musculoskeletal: no clubbing / cyanosis.  Good ROM, no contractures. Normal muscle tone.  Skin: no new rashes, lesions, ulcers very limited otological semination. Neurologic:  Speech is mildly slurred. Left facial droop with decreased sensation., otherwise CN 2-12 grossly intact.  4/5 LUE weakness. Psychiatric: Normal judgment and insight. Alert and oriented x 3. Normal mood.   Labs on Admission: I have personally reviewed following labs and imaging studies  CBC: Recent Labs  Lab 07/20/20 1945 07/20/20 1949  WBC 10.0  --   NEUTROABS 6.9  --   HGB 12.1* 12.6*  HCT 38.3* 37.0*  MCV 90.1  --   PLT 363  --     Basic Metabolic Panel: Recent Labs  Lab 07/20/20 0755 07/20/20 1945 07/20/20 1949  NA 140 137 138  K 5.1 4.7 4.6  CL 102 102 102  CO2 23 26  --   GLUCOSE 113* 105* 102*  BUN 24 27* 30*  CREATININE 1.22 1.38* 1.40*  CALCIUM 9.2 9.2  --      GFR: Estimated Creatinine Clearance: 71 mL/min (A) (by C-G formula based on SCr of 1.4 mg/dL (H)).  Liver Function Tests: Recent Labs  Lab 07/20/20 1945  AST 20  ALT 17  ALKPHOS 90  BILITOT 0.9  PROT 7.0  ALBUMIN 3.3*   Radiological Exams on Admission: CT Head Wo Contrast  Result Date: 07/20/2020 CLINICAL DATA:  Left-sided weakness with slurred speech EXAM: CT HEAD WITHOUT CONTRAST TECHNIQUE: Contiguous axial images were obtained from the base of the skull through the vertex without intravenous contrast. COMPARISON:  None. FINDINGS: Brain: No hemorrhage or intracranial mass. Small focus of hypodensity in the right white matter. Normal ventricle size. Vascular: No hyperdense vessels.  No unexpected calcification Skull: Normal. Negative for fracture or focal lesion. Sinuses/Orbits: Mucosal thickening or retention cysts in the maxillary sinuses and ethmoid sinuses Other: None IMPRESSION: 1. Small focus of hypodensity in the right white matter, may represent age  indeterminate lacunar infarct or age indeterminate small vessel ischemic change. 2. Otherwise no CT evidence for acute intracranial abnormality. Electronically Signed   By: Jasmine Pang M.D.   On: 07/20/2020 21:28   07/02/2020  LEFT HEART CATH AND CORONARY ANGIOGRAPHY   07/08/2020 TEE Complications: No known complications during this procedure.  PRE-OP FINDINGS  Left Ventricle: Marland Kitchen There was normal LV systolic function with the ejection  fraction estimated at 55-60% using the 3DQ volumetric package. There were  no regional wall motion abnormalities. There was moderate concentric left  ventricular hypertrophy with  the LV wall thickness 1.15-1.20 cm.   On the post-bypass exam, the LV systolic function was normal and unchanged  from the pre-bypass exam.   Right Ventricle: The right ventricle has normal systolic function. The  cavity was normal. There is no increase in right ventricular wall  thickness. On the post-bypass  exam, the RV systolic function was normal  and unchanged from the pre-bypass study.   Left Atrium: Left atrial size was dilated. The left atrial appendage is  well visualized and there is evidence of thrombus present. Left atrial  appendage velocity is normal at greater than 40 cm/s.   Right Atrium: Right atrial size was dilated.   Interatrial Septum: No atrial level shunt detected by color flow Doppler.   Pericardium: There is no evidence of pericardial effusion.   Mitral Valve: The mitral valve is normal in structure. Mild thickening of  the mitral valve leaflet. Mitral valve regurgitation is trivial by color  flow Doppler. There is No evidence of mitral stenosis.   Tricuspid Valve: The tricuspid valve was normal in structure. Tricuspid  valve regurgitation is trivial by color flow Doppler.   Aortic Valve: The aortic valve is tricuspid Aortic valve regurgitation was  not visualized by color flow Doppler. There is no evidence of aortic valve  stenosis. There is no evidence of a vegetation on the aortic valve.   Pulmonic Valve: The pulmonic valve was normal in structure, with normal.  No evidence of pumonic stenosis.  Pulmonic valve regurgitation is trivial by color flow Doppler.    Aorta: The ascending aorta was normal in diameter with a well defined  aortic root and sinotubular junction without dilataion or effacement. The  aortic root at the sinuses of valsalva was 2.98 cm The STJ was 2.91 cm.  The proximal ascending aorta was 2.93  cm. There was normal wall thickness of the ascending aorta.   The descending aorta was normal in diameter and measured . There was grade  3-4 atherosclerotic plaque within the descending aorta was 2.03 cm in  diameter.   Conclusion    1st Mrg lesion is 75% stenosed.  Mid Cx lesion is 80% stenosed.  Prox RCA lesion is 80% stenosed.  Mid LAD lesion is 50% stenosed.  Dist LAD lesion is 75% stenosed.  RPDA-1 lesion is 75%  stenosed.  RPDA-2 lesion is 50% stenosed.  RPAV lesion is 50% stenosed.  Prox LAD to Mid LAD lesion is 75% stenosed.  The left ventricular systolic function is normal.  LV end diastolic pressure is normal.  The left ventricular ejection fraction is 55-65% by visual estimate.  There is no aortic valve stenosis.   Diffuse three vessel disease, particularly throughout the LAD.  Plan for cardiac surgery consult.  Distal small vessel disease may affect quality of targets.    I stressed the importance of risk factor modification with the patient including lipid lowering therapy and BP control.  ----------------------------------------------------------------------------------------------------------  EKG: Independently reviewed.  Vent. rate 74 BPM PR interval 158 ms QRS duration 88 ms QT/QTc 400/444 ms P-R-T axes 48 69 39 Normal sinus rhythm Possible Left atrial enlargement Borderline ECG  Assessment/Plan Principal Problem:   Acute CVA (cerebrovascular accident) (HCC) Admit to telemetry/inpatient. Frequent neurochecks. Consult PT/OT/SLP. Check fasting lipids. Check hemoglobin A1c. Check echocardiogram. Continue DAPT. Neurology consult appreciated.  Active Problems:   Coronary artery disease   S/P CABG x 4' Continue DAPT. Hold carvedilol. Continue atorvastatin. Follow-up with CTS as scheduled. Follow-up with cardiology as scheduled.    Hypertension Allow permissive hypertension. Monitor blood pressure closely.   DVT prophylaxis: Lovenox SQ. Code Status:   Full code. Family Communication:  His wife was at bedside. Disposition Plan:   Patient is from:  Home.  Anticipated DC to:  Home.  Anticipated DC date:  07/22/2020 or 07/23/2020.Marland Kitchen.  Anticipated DC barriers: Clinical status and work-up.  Consults called:   Admission status:  Observation/telemetry.   Severity of Illness: High due to new CVA on a patient who underwent CABG recently, which raises suspicion  for the thromboembolic event.  The patient will need to remain in the hospital for close monitoring and CVA work-up.  Bobette Moavid Manuel Arif Amendola MD [facility]  How to contact the [facility] Attending or Consulting provider 7A - 7P or covering provider during after hours 7P -7A, for this patient?   1. Check the care team in [facility] and look for a) attending/consulting [facility] provider listed and b) the [facility] team listed 2. Log into [email or web address] and use [facility]'s universal password to access. If you do not have the password, please contact the hospital operator. 3. Locate the [facility] provider you are looking for under [facility] and page to a number that you can be directly reached. 4. If you still have difficulty reaching the provider, please page the [facility] (Director on Call) for the Hospitalists listed on amion for assistance.  07/20/2020, 11:25 PM   This document was prepared using Dragon voice recognition software and may contain some unintended transcription errors.

## 2020-07-20 NOTE — ED Triage Notes (Signed)
Pt comes via POV, states he had heart surgery last week, over the past week has had slurred speech on and off with drooling, today at 3pm, wife noticed L sided facial droop and slurred speech, slurred speech had resolved and facial droop remains and L hand is weak.

## 2020-07-20 NOTE — Consult Note (Signed)
Neurology Consultation Reason for Consult: Left-sided weakness and facial droop Requesting Physician: Tilden Fossa  CC: Left-sided facial droop and slurred speech  History is obtained from: Patient, wife, and chart review  HPI: Leonard Donovan is a 55 y.o. right-handed male with a past medical history significant for hypertension, coronary artery disease (s/p quadruple CABG on 07/08/2020), glaucoma, COVID-19 infection (sx on 06/07/2020, dx on 06/18/2020), remote smoking (26 pack years, quit in his early 78s), c/f sleep apnea (not formally evaluated, cannot sleep on his back without symptoms),   He has had intermittent chest pain for at least 1 year.  He then had COVID-19 infection in January 2020, afterwards his chest pain was much more frequent.  He presented for evaluation of this in early February and was found to have significant coronary artery disease for which he received a quadruple bypass surgery on 2/9.  He reports that on awakening from the procedure he had some drooling on the left side of his face as well as slurred speech and mild left upper extremity weakness.  His wife did not notice his symptoms until Saturday night and initially attributed the slurred speech to his pain medications.  However since his drooling persisted as did the slurred speech even after stopping all pain medications the past few days, they presented to the ED for further evaluation.  Some of his symptoms have been intermittent, for example he reports he was having a more left-sided numbness previously when compared to the time of my evaluation  On review of systems he does note some mild swelling in the right lower extremity which she attributes to vein harvesting.  He has also been slightly dizzy with ambulation only which he also attributes to recovering from his surgery.  He has had some constipation with pain medications which has resolved  LKW: At least several days ago per wife tPA given?: No, due to out of the  window Premorbid modified rankin scale:     1 - No significant disability. Able to carry out all usual activities, despite some symptoms.  ROS: All other review of systems was negative except as noted in the HPI.   Past Medical History:  Diagnosis Date   Glaucoma    Hypertension     Family History  Problem Relation Age of Onset   Hypertension Father    CAD Sister        diagnosed in her 78s   Heart attack Sister   Negative for stroke  Current Meds  Medication Sig   acetaminophen (TYLENOL) 500 MG tablet Take 1,000 mg by mouth every 6 (six) hours as needed for mild pain (or discomfort).   aspirin EC 81 MG EC tablet Take 1 tablet (81 mg total) by mouth daily. Swallow whole.   atorvastatin (LIPITOR) 80 MG tablet Take 1 tablet (80 mg total) by mouth daily.   carvedilol (COREG) 6.25 MG tablet Take 1 tablet (6.25 mg total) by mouth 2 (two) times daily with a meal.   clopidogrel (PLAVIX) 75 MG tablet Take 1 tablet (75 mg total) by mouth daily.   losartan (COZAAR) 25 MG tablet Take 1 tablet (25 mg total) by mouth every evening.   Multiple Vitamins-Minerals (MULTIVITAMIN WITH MINERALS) tablet Take 1 tablet by mouth daily.   traMADol (ULTRAM) 50 MG tablet Take 1 tablet (50 mg total) by mouth every 6 (six) hours as needed for up to 7 days for moderate pain.     Social History:  reports that he has quit  smoking. He has quit using smokeless tobacco. He reports previous alcohol use. He reports previous drug use. He smoked 2 packs/day for 13 years for 26-pack-year history of smoking  Exam: Current vital signs: BP 138/74    Pulse (!) 59    Resp 19    SpO2 99%  Vital signs in last 24 hours: Pulse Rate:  [59-62] 59 (02/21 2115) Resp:  [14-19] 19 (02/21 2115) BP: (120-138)/(74-79) 138/74 (02/21 2115) SpO2:  [97 %-99 %] 99 % (02/21 2115)   Physical Exam  Constitutional: Appears well-developed and well-nourished.  Psych: Affect overall not particularly concerned, minimizes  symptoms Eyes: No scleral injection HENT: No oropharyngeal obstruction.  MSK: no joint deformities.  Cardiovascular: Normal rate and regular rhythm.  Respiratory: Effort normal, non-labored breathing GI: Soft.  No distension. There is no tenderness.  Skin: Warm dry and intact visible skin  Neuro: Mental Status: Patient is awake, alert, oriented to person, place, month, year, and situation. Patient is able to give a clear and coherent history. No signs of aphasia or neglect Cranial Nerves: II: Visual Fields are full. Pupils are equal, round, and reactive to light.   III,IV, VI: EOMI with ptosis of the right eye and relatively increased palpebral fissure of the left V: Facial sensation is reduced on the left cheek VII: Facial movement is notable for mild left facial droop.  VIII: hearing is intact to voice X: Uvula elevates symmetrically XI: Shoulder shrug is symmetric. XII: tongue is midline without atrophy or fasciculations.  Motor: Tone is normal. Bulk is normal. 5/5 strength was present in all four extremities except for some mild left upper extremity weakness 4+/5 Sensory: Sensation is symmetric to light touch and temperature in the arms and legs. Deep Tendon Reflexes: 3+ and symmetric in the biceps and patellae.  Plantars: Toes are downgoing bilaterally.  Cerebellar: FNF and HKS are intact bilaterally  NIHSS total 3 Score breakdown: 1 point for facial droop, 1 point for mild dysarthria, 1 point for some slight loss of sensation in the face     I have reviewed labs in epic and the results pertinent to this consultation are:  Last metabolic panel Lab Results  Component Value Date   GLUCOSE 102 (H) 07/20/2020   NA 138 07/20/2020   K 4.6 07/20/2020   CL 102 07/20/2020   CO2 26 07/20/2020   BUN 30 (H) 07/20/2020   CREATININE 1.40 (H) 07/20/2020   GFRNONAA >60 07/20/2020   GFRAA 78 07/20/2020   CALCIUM 9.2 07/20/2020   PROT 7.0 07/20/2020   ALBUMIN 3.3 (L)  07/20/2020   BILITOT 0.9 07/20/2020   ALKPHOS 90 07/20/2020   AST 20 07/20/2020   ALT 17 07/20/2020   ANIONGAP 9 07/20/2020     Lab Results  Component Value Date   CHOL 149 07/03/2020   HDL 34 (L) 07/03/2020   LDLCALC 99 07/03/2020   TRIG 82 07/03/2020   CHOLHDL 4.4 07/03/2020    Lab Results  Component Value Date   HGBA1C 5.7 (H) 07/02/2020     I have reviewed the images obtained:  Head CT personally reviewed, age-indeterminate infarct of the right corona radiata  Impression: Likely cardioembolic strokes in the setting of his recent procedure  Recommendations: # Like left lacunar stroke secondary, possibly cardiembolic in the setting of recent surgery - HgbA1c, fasting lipid panel completed earlier this month no need for repeat - MRI brain   - CTA  - Frequent neuro checks - Echocardiogram to rule out intracardiac thrombus -  Continue home DAPT - Risk factor modification - Telemetry monitoring; consider 30 day event monitor on discharge if no arrythmias captured  - Blood pressure goal  - PT consult, OT consult, Speech consult - Stroke team to follow  Brooke Dare MD-PhD [facility] [phone]

## 2020-07-21 ENCOUNTER — Inpatient Hospital Stay (HOSPITAL_COMMUNITY): Payer: BC Managed Care – PPO

## 2020-07-21 DIAGNOSIS — I6340 Cerebral infarction due to embolism of unspecified cerebral artery: Secondary | ICD-10-CM | POA: Diagnosis not present

## 2020-07-21 DIAGNOSIS — I10 Essential (primary) hypertension: Secondary | ICD-10-CM | POA: Diagnosis not present

## 2020-07-21 DIAGNOSIS — I6389 Other cerebral infarction: Secondary | ICD-10-CM | POA: Diagnosis not present

## 2020-07-21 DIAGNOSIS — I639 Cerebral infarction, unspecified: Secondary | ICD-10-CM | POA: Diagnosis not present

## 2020-07-21 DIAGNOSIS — Z951 Presence of aortocoronary bypass graft: Secondary | ICD-10-CM | POA: Diagnosis not present

## 2020-07-21 LAB — LIPID PANEL
Cholesterol: 103 mg/dL (ref 0–200)
HDL: 24 mg/dL — ABNORMAL LOW
LDL Cholesterol: 49 mg/dL (ref 0–99)
Total CHOL/HDL Ratio: 4.3 ratio
Triglycerides: 148 mg/dL
VLDL: 30 mg/dL (ref 0–40)

## 2020-07-21 LAB — ECHOCARDIOGRAM COMPLETE
Area-P 1/2: 3.27 cm2
Height: 68 in
S' Lateral: 3.3 cm
Weight: 3276.8 oz

## 2020-07-21 LAB — HEMOGLOBIN A1C
Hgb A1c MFr Bld: 5.6 % (ref 4.8–5.6)
Mean Plasma Glucose: 114.02 mg/dL

## 2020-07-21 LAB — HIV ANTIBODY (ROUTINE TESTING W REFLEX): HIV Screen 4th Generation wRfx: NONREACTIVE

## 2020-07-21 MED ORDER — ALBUMIN HUMAN 25 % IV SOLN: 12.5000 g | Freq: Once | INTRAVENOUS | Status: DC

## 2020-07-21 MED ORDER — SODIUM CHLORIDE 0.9 % IV SOLN: INTRAVENOUS | Status: DC

## 2020-07-21 MED ORDER — LACTATED RINGERS IV BOLUS: 250.0000 mL | Freq: Once | INTRAVENOUS | Status: DC

## 2020-07-21 NOTE — Progress Notes (Signed)
  Echocardiogram 2D Echocardiogram has been performed.  Leonard Donovan 07/21/2020, 1:56 PM

## 2020-07-21 NOTE — Progress Notes (Signed)
OT Cancellation Note  Patient Details Name: CABE LASHLEY MRN: [id number] DOB: 09-20-1965   Cancelled Treatment:    Reason Eval/Treat Not Completed: Patient at procedure or test/ unavailable. OT to check back as time allows.   Kallie Edward OTR/L Supplemental OT, Department of rehab services [phone]  Neko Boyajian R H. 07/21/2020, 1:56 PM

## 2020-07-21 NOTE — Evaluation (Signed)
Physical Therapy Evaluation and Discharge Patient Details Name: Leonard Donovan MRN: [id number] DOB: [birth year] Today's Date: 07/21/2020   History of Present Illness  Pt is a 55 y/o male admitted secondary to L sided facial droop and slurred speech. 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 Per notes possibly cardioembolic. Pt is recently s/p CABG. PMH includes CAD, HTN, glaucoma, and COVID.  Clinical Impression  Patient evaluated by Physical Therapy with no further acute PT needs identified. All education has been completed and the patient has no further questions. Overall pt steady and no LOB noted. Reports no symptoms in LE and reports most of symptoms have resolved. Pt reports wife available to assist at d/c. Continues to appropriately maintain sternal precautions.  See below for any follow-up Physical Therapy or equipment needs. PT is signing off. Thank you for this referral. If needs change, please re-consult.      Follow Up Recommendations No PT follow up    Equipment Recommendations  None recommended by PT    Recommendations for Other Services       Precautions / Restrictions Precautions Precautions: Sternal Precaution Booklet Issued: No Precaution Comments: Pt able to recall sternal precautions. Restrictions Weight Bearing Restrictions: No      Mobility  Bed Mobility Overal bed mobility: Modified Independent             General bed mobility comments: Demonstrated appropriate log roll technique. Increased time required from stretcher    Transfers Overall transfer level: Independent                  Ambulation/Gait Ambulation/Gait assistance: Supervision Gait Distance (Feet): 40 Feet Assistive device: None Gait Pattern/deviations: Step-through pattern;Decreased stride length     General Gait Details: Overall steady with ambulation. No LOB noted. Supervision for line management.  Stairs             Wheelchair Mobility    Modified Rankin (Stroke Patients Only) Modified Rankin (Stroke Patients Only) Pre-Morbid Rankin Score: No significant disability Modified Rankin: No significant disability     Balance Overall balance assessment: No apparent balance deficits (not formally assessed)                                           Pertinent Vitals/Pain Pain Assessment: No/denies pain    Home Living Family/patient expects to be discharged to:: Private residence Living Arrangements: Spouse/significant other Available Help at Discharge: Family Type of Home: House Home Access: Stairs to enter Entrance Stairs-Rails: None Secretary/administrator of Steps: 4 Home Layout: One level Home Equipment: None      Prior Function Level of Independence: Independent               Hand Dominance        Extremity/Trunk Assessment   Upper Extremity Assessment Upper Extremity Assessment: Defer to OT evaluation    Lower Extremity Assessment Lower Extremity Assessment: Overall WFL for tasks assessed    Cervical / Trunk Assessment Cervical / Trunk Assessment: Normal  Communication   Communication: No difficulties  Cognition Arousal/Alertness: Awake/alert Behavior During Therapy: WFL for tasks assessed/performed Overall Cognitive Status: Within Functional Limits for tasks assessed  General Comments General comments (skin integrity, edema, etc.): VSS. Educated about "BE FAST" acronym in recognizing CVA symptoms.    Exercises     Assessment/Plan    PT Assessment Patent does not need any further PT services  PT Problem List         PT Treatment Interventions      PT Goals (Current goals can be found in the Care Plan section)  Acute Rehab PT Goals Patient Stated Goal: to go home PT Goal Formulation: With patient Time For Goal Achievement: 07/21/20 Potential to Achieve Goals: Good     Frequency     Barriers to discharge        Co-evaluation               AM-PAC PT "6 Clicks" Mobility  Outcome Measure Help needed turning from your back to your side while in a flat bed without using bedrails?: None Help needed moving from lying on your back to sitting on the side of a flat bed without using bedrails?: None Help needed moving to and from a bed to a chair (including a wheelchair)?: None Help needed standing up from a chair using your arms (e.g., wheelchair or bedside chair)?: None Help needed to walk in hospital room?: None Help needed climbing 3-5 steps with a railing? : A Little 6 Click Score: 23    End of Session Equipment Utilized During Treatment: Gait belt Activity Tolerance: Patient tolerated treatment well Patient left: in bed;with call bell/phone within reach;with family/visitor present;with nursing/sitter in room (on stretcher in ED with RN present) Nurse Communication: Mobility status PT Visit Diagnosis: Other symptoms and signs involving the nervous system (Q25.956)    Time: 3875-6433 PT Time Calculation (min) (ACUTE ONLY): 11 min   Charges:   PT Evaluation $PT Eval Low Complexity: 1 Low          Cindee Salt, DPT  Acute Rehabilitation Services  Pager: [phone] Office: [phone]   Lehman Prom 07/21/2020, 12:54 PM

## 2020-07-21 NOTE — Plan of Care (Signed)
  Problem: Education: Goal: Knowledge of secondary prevention will improve 07/21/2020 1311 by Patrica Duel, RN Outcome: Progressing 07/21/2020 1311 by Patrica Duel, RN Outcome: Progressing Goal: Knowledge of patient specific risk factors addressed and post discharge goals established will improve 07/21/2020 1311 by Patrica Duel, RN Outcome: Progressing 07/21/2020 1311 by Patrica Duel, RN Outcome: Progressing   Problem: Self-Care: Goal: Ability to participate in self-care as condition permits will improve 07/21/2020 1311 by Patrica Duel, RN Outcome: Progressing 07/21/2020 1311 by Patrica Duel, RN Outcome: Progressing

## 2020-07-21 NOTE — Progress Notes (Addendum)
STROKE TEAM PROGRESS NOTE   INTERVAL HISTORY  Patient lying in bed.  Appears comfortable.  Afebrile, Vital signs stable. Neuroexam: A+Ox3, PERRLA, EOMI, left facial droop, mild dysarthria, right UE/LE 5/5, left UE/LL 4+/5.    Vitals:   07/21/20 1100 07/21/20 1142 07/21/20 1200 07/21/20 1234  BP: (!) 105/57  101/60 128/86  Pulse: 69 62 68 69  Resp: (!) 21 20 (!) 21 20  Temp:   98 F (36.7 C) 98.2 F (36.8 C)  TempSrc:   Oral Oral  SpO2: 95% 96% 95% 100%  Weight:    92.9 kg  Height:    5\' 8"  (1.727 m)   CBC:  Recent Labs  Lab 07/20/20 1945 07/20/20 1949  WBC 10.0  --   NEUTROABS 6.9  --   HGB 12.1* 12.6*  HCT 38.3* 37.0*  MCV 90.1  --   PLT 363  --    Basic Metabolic Panel:  Recent Labs  Lab 07/20/20 0755 07/20/20 1945 07/20/20 1949  NA 140 137 138  K 5.1 4.7 4.6  CL 102 102 102  CO2 23 26  --   GLUCOSE 113* 105* 102*  BUN 24 27* 30*  CREATININE 1.22 1.38* 1.40*  CALCIUM 9.2 9.2  --    Lipid Panel:  Recent Labs  Lab 07/21/20 0500  CHOL 103  TRIG 148  HDL 24*  CHOLHDL 4.3  VLDL 30  LDLCALC 49   HgbA1c:  Recent Labs  Lab 07/21/20 0500  HGBA1C 5.6   Urine Drug Screen: No results for input(s): LABOPIA, COCAINSCRNUR, LABBENZ, AMPHETMU, THCU, LABBARB in the last 168 hours.  Alcohol Level No results for input(s): ETH in the last 168 hours.  IMAGING past 24 hours CT Code Stroke CTA Head W/WO contrast  Result Date: 07/20/2020 CLINICAL DATA:  Left facial droop and slurred speech EXAM: CT ANGIOGRAPHY HEAD AND NECK TECHNIQUE: Multidetector CT imaging of the head and neck was performed using the standard protocol during bolus administration of intravenous contrast. Multiplanar CT image reconstructions and MIPs were obtained to evaluate the vascular anatomy. Carotid stenosis measurements (when applicable) are obtained utilizing NASCET criteria, using the distal internal carotid diameter as the denominator. CONTRAST:  2mL OMNIPAQUE IOHEXOL 350 MG/ML SOLN  COMPARISON:  None. FINDINGS: CTA NECK FINDINGS SKELETON: There is no bony spinal canal stenosis. No lytic or blastic lesion. OTHER NECK: Normal pharynx, larynx and major salivary glands. No cervical lymphadenopathy. Unremarkable thyroid gland. UPPER CHEST: Small left pleural effusion. AORTIC ARCH: There is no calcific atherosclerosis of the aortic arch. There is no aneurysm, dissection or hemodynamically significant stenosis of the visualized portion of the aorta. Conventional 3 vessel aortic branching pattern. The visualized proximal subclavian arteries are widely patent. RIGHT CAROTID SYSTEM: No dissection, occlusion or aneurysm. There is low density atherosclerosis extending into the proximal ICA, resulting in less than 50% stenosis. LEFT CAROTID SYSTEM: No dissection, occlusion or aneurysm. Mild atherosclerotic calcification at the carotid bifurcation without hemodynamically significant stenosis. VERTEBRAL ARTERIES: Codominant configuration. Both origins are clearly patent. There is no dissection, occlusion or flow-limiting stenosis to the skull base (V1-V3 segments). CTA HEAD FINDINGS POSTERIOR CIRCULATION: --Vertebral arteries: Normal V4 segments. --Inferior cerebellar arteries: Normal. --Basilar artery: Normal. --Superior cerebellar arteries: Normal. --Posterior cerebral arteries (PCA): Normal. ANTERIOR CIRCULATION: --Intracranial internal carotid arteries: Normal. --Anterior cerebral arteries (ACA): Normal. Both A1 segments are present. Patent anterior communicating artery (a-comm). --Middle cerebral arteries (MCA): Normal. VENOUS SINUSES: As permitted by contrast timing, patent. ANATOMIC VARIANTS: None Review of the MIP images  confirms the above findings. IMPRESSION: 1. No intracranial arterial occlusion or high-grade stenosis. 2. Bilateral carotid bifurcation atherosclerosis without hemodynamically significant stenosis by NASCET criteria. 3. Small left pleural effusion. Electronically Signed   By: Deatra Robinson M.D.   On: 07/20/2020 23:38   CT Head Wo Contrast  Result Date: 07/20/2020 CLINICAL DATA:  Left-sided weakness with slurred speech EXAM: CT HEAD WITHOUT CONTRAST TECHNIQUE: Contiguous axial images were obtained from the base of the skull through the vertex without intravenous contrast. COMPARISON:  None. FINDINGS: Brain: No hemorrhage or intracranial mass. Small focus of hypodensity in the right white matter. Normal ventricle size. Vascular: No hyperdense vessels.  No unexpected calcification Skull: Normal. Negative for fracture or focal lesion. Sinuses/Orbits: Mucosal thickening or retention cysts in the maxillary sinuses and ethmoid sinuses Other: None IMPRESSION: 1. Small focus of hypodensity in the right white matter, may represent age indeterminate lacunar infarct or age indeterminate small vessel ischemic change. 2. Otherwise no CT evidence for acute intracranial abnormality. Electronically Signed   By: Jasmine Pang M.D.   On: 07/20/2020 21:28   CT Code Stroke CTA Neck W/WO contrast  Result Date: 07/20/2020 CLINICAL DATA:  Left facial droop and slurred speech EXAM: CT ANGIOGRAPHY HEAD AND NECK TECHNIQUE: Multidetector CT imaging of the head and neck was performed using the standard protocol during bolus administration of intravenous contrast. Multiplanar CT image reconstructions and MIPs were obtained to evaluate the vascular anatomy. Carotid stenosis measurements (when applicable) are obtained utilizing NASCET criteria, using the distal internal carotid diameter as the denominator. CONTRAST:  65mL OMNIPAQUE IOHEXOL 350 MG/ML SOLN COMPARISON:  None. FINDINGS: CTA NECK FINDINGS SKELETON: There is no bony spinal canal stenosis. No lytic or blastic lesion. OTHER NECK: Normal pharynx, larynx and major salivary glands. No cervical lymphadenopathy. Unremarkable thyroid gland. UPPER CHEST: Small left pleural effusion. AORTIC ARCH: There is no calcific atherosclerosis of the aortic arch. There is no  aneurysm, dissection or hemodynamically significant stenosis of the visualized portion of the aorta. Conventional 3 vessel aortic branching pattern. The visualized proximal subclavian arteries are widely patent. RIGHT CAROTID SYSTEM: No dissection, occlusion or aneurysm. There is low density atherosclerosis extending into the proximal ICA, resulting in less than 50% stenosis. LEFT CAROTID SYSTEM: No dissection, occlusion or aneurysm. Mild atherosclerotic calcification at the carotid bifurcation without hemodynamically significant stenosis. VERTEBRAL ARTERIES: Codominant configuration. Both origins are clearly patent. There is no dissection, occlusion or flow-limiting stenosis to the skull base (V1-V3 segments). CTA HEAD FINDINGS POSTERIOR CIRCULATION: --Vertebral arteries: Normal V4 segments. --Inferior cerebellar arteries: Normal. --Basilar artery: Normal. --Superior cerebellar arteries: Normal. --Posterior cerebral arteries (PCA): Normal. ANTERIOR CIRCULATION: --Intracranial internal carotid arteries: Normal. --Anterior cerebral arteries (ACA): Normal. Both A1 segments are present. Patent anterior communicating artery (a-comm). --Middle cerebral arteries (MCA): Normal. VENOUS SINUSES: As permitted by contrast timing, patent. ANATOMIC VARIANTS: None Review of the MIP images confirms the above findings. IMPRESSION: 1. No intracranial arterial occlusion or high-grade stenosis. 2. Bilateral carotid bifurcation atherosclerosis without hemodynamically significant stenosis by NASCET criteria. 3. Small left pleural effusion. Electronically Signed   By: Deatra Robinson M.D.   On: 07/20/2020 23:38   MR BRAIN WO CONTRAST  Result Date: 07/21/2020 CLINICAL DATA:  Slurred speech and ruling EXAM: MRI HEAD WITHOUT CONTRAST TECHNIQUE: Multiplanar, multiecho pulse sequences of the brain and surrounding structures were obtained without intravenous contrast. COMPARISON:  None. FINDINGS: Brain: There are areas of abnormal diffusion  restriction in the right pons and at multiple locations in the bilateral frontal  and parietal white matter. The largest of these lesions is in the paramedian superior left frontal lobe. No acute or chronic hemorrhage. Normal white matter signal, parenchymal volume and CSF spaces. The midline structures are normal. Vascular: Major flow voids are preserved. Skull and upper cervical spine: Normal calvarium and skull base. Visualized upper cervical spine and soft tissues are normal. Sinuses/Orbits:No paranasal sinus fluid levels or advanced mucosal thickening. No mastoid or middle ear effusion. Normal orbits. IMPRESSION: Acute right pontine infarct in addition to multiple small acute to early subacute infarcts within the bilateral frontal and parietal white matter, consistent with a central (cardiac or aortic) embolic source. No hemorrhage or mass effect. Electronically Signed   By: Deatra Robinson M.D.   On: 07/21/2020 00:01    PHYSICAL EXAM Physical Exam  Constitutional: Appears well-developed and well-nourished.  Psych: Affect overall not particularly concerned, minimizes symptoms Eyes: No scleral injection HENT: No oropharyngeal obstruction.  MSK: no joint deformities.  Cardiovascular: Normal rate and regular rhythm.  Respiratory: Effort normal, non-labored breathing GI: Soft.  No distension. There is no tenderness.  Skin: Warm dry and intact visible skin  Neuro: Mental Status: Patient is awake, alert, oriented to person, place, month, year, and situation. Patient is able to give a clear and coherent history. No signs of aphasia or neglect Cranial Nerves: II: Visual Fields are full. Pupils are equal, round, and reactive to light.   III,IV, VI: EOMI with ptosis of the right eye  V: Facial sensation is reduced on the left cheek VII: Facial movement is notable for mild left facial droop.  VIII: hearing is intact to voice X: Uvula elevates symmetrically XI: Shoulder shrug is symmetric. XII:  tongue is midline Motor: Tone is normal. Bulk is normal. 5/5 strength was present in all four extremities except for some mild left upper extremity weakness 4+/5 Sensory: Sensation is symmetric to light touch and temperature in the arms and legs. Deep Tendon Reflexes: 3+ and symmetric in the biceps and patellae.  Plantars: Toes are downgoing bilaterally.  Cerebellar: FNF and HKS are intact bilaterally  ASSESSMENT/PLAN [name] is a 55 y.o. male with history of hypertension, coronary artery disease (s/p quadruple CABG on 07/08/2020), glaucoma, COVID-19 infection (sx on 06/07/2020, dx on 06/18/2020), presenting with left sided facial droop, slurred speech, and left upper extremity weakness.  He was found to have an acute right pontine infarct and multiple small acute/subacute infarcts within the bilateral frontal and parietal white matter.    Stroke:  Multifocal infarcts, largest at right pontine, likely related to recent cardiac surgery. However, other cardioembolic source can not rule out  Code Stroke CT head: Small focus of hypodensity in the right white matter.  CTA head & neck: no intracranial arterial occlusion or high-grade stenosis. Bilateral carotid bifurcation atherosclerosis, right more than left  MRI Acute right pontine infarct in addition to multiple small acute to early subacute infarcts within the bilateral frontal and parietal white matter, consistent with a central (cardiac or aortic) embolic source. No hemorrhage or mass effect.  Recommend 30 day monitor to evaluate for atrial fibrillation as the source for stroke  2D Echo: EF 60 to 65%  LDL 49  HgbA1c 5.6  VTE prophylaxis - Lovenox 40mg  daily  Swallow eval: passed for heart healthy diet  aspirin 81 mg daily and Plavix prior to admission, continue aspirin 81 mg daily and clopidogrel 75 mg daily DAPT on discharge.  Therapy recommendations:  none  Disposition: pending  Will sign off  for now  CAD  s/p  CABG on 07/08/2020  Pt started right facial droop after the procedure  On DAPT and statin  Follow up with cardiology  Hypertension  Home meds:  Losartan 25mg  daily  Stable . Long-term BP goal normotensive  Hyperlipidemia  Home meds:  Atorvastatin 80, resumed in hospital  LDL 49, goal < 70  Continue statin at discharge  Other Stroke Risk Factors  Former cigarette smoker, prior (quit 30 years ago)  Obesity, Body mass index is 31.14 kg/m., BMI >/= 30 associated with increased stroke risk, recommend weight loss, diet and exercise as appropriate   Suspect obstructive sleep apnea   Other Active Problems  Covid infection 05/2020  AKI, creatinine 1.22-1.38-1.40  Hospital day # 1  Neurology will sign off. Please call with questions. [facility]. Thanks for the consult.  Marvel PlanJindong Abhijay Morriss, MD PhD Stroke Neurology 07/21/2020 6:12 PM    To contact Stroke Continuity provider, please refer to [email or web address]. After hours, contact General Neurology

## 2020-07-21 NOTE — Progress Notes (Addendum)
[facility]  PROGRESS NOTE  Leonard Donovan FGH:[id number] DOB: 31-Dec-1965 DOA: 07/20/2020 PCP: No primary care provider on file.   Brief HPI:   55 year old male with medical history of glaucoma, hypertension, COVID-19 infection last month, former smoker, CAD s/p CABG on 07/08/2020 came to ED with complaints of left facial droop, slurred speech and left upper extremity weakness.  Patient's wife states that she noticed the symptoms 2 days after CABG but thought that these were from the pain medications. 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  No hemorrhage or mass-effect. Neurology was consulted.    Subjective   Patient seen and examined, continues to have slurred speech.  Denies any visual disturbance.   Assessment/Plan:     1. Acute CVA-MRI brain showed right pontine infarct in addition to multiple small acute or subacute lobe infarcts in the bilateral frontal and parietal white matter consistent with embolic source.  Neurology has been consulted.  Continue aspirin, Plavix.  Follow echocardiogram.  Follow hemoglobin A1c, fasting lipids.  PT/OT consulted.  Antihypertensive medications on hold for permissive hypertension. 2. CAD s/p CABG x4-continue DAPT, Coreg on hold.  Continue atorvastatin.  CT surgery has seen the patient in the ED. 3. Hypertension-Coreg, losartan on hold for permissive hypertension.  4. Acute kidney injury-patient's BUN/creatinine on 07/08/2020 was 14/0.87.  Likely presenting with acute kidney injury, today creatinine is 1.40.  We will start normal saline at 75 mill per hour.  Follow BMP in am.       COVID-19 Labs  No results for input(s): DDIMER, FERRITIN, LDH, CRP in the last 72 hours.  Lab Results  Component Value Date   SARSCOV2NAA POSITIVE (A) 07/01/2020     Scheduled medications:   .  aspirin EC  81 mg Oral Daily  . atorvastatin  80 mg Oral Daily  . clopidogrel  75 mg Oral Daily  . enoxaparin (LOVENOX) injection  40 mg Subcutaneous Q24H    SpO2: 95 %    CBC: Recent Labs  Lab 07/20/20 1945 07/20/20 1949  WBC 10.0  --   NEUTROABS 6.9  --   HGB 12.1* 12.6*  HCT 38.3* 37.0*  MCV 90.1  --   PLT 363  --     Basic Metabolic Panel: Recent Labs  Lab 07/20/20 0755 07/20/20 1945 07/20/20 1949  NA 140 137 138  K 5.1 4.7 4.6  CL 102 102 102  CO2 23 26  --   GLUCOSE 113* 105* 102*  BUN 24 27* 30*  CREATININE 1.22 1.38* 1.40*  CALCIUM 9.2 9.2  --      Liver Function Tests: Recent Labs  Lab 07/20/20 1945  AST 20  ALT 17  ALKPHOS 90  BILITOT 0.9  PROT 7.0  ALBUMIN 3.3*     Antibiotics: Anti-infectives (From admission, onward)   None       DVT prophylaxis: Lovenox  Code Status: Full code  Family Communication: No family at bedside   Consultants:    Procedures:      Objective   Vitals:   07/21/20 0900 07/21/20 0930 07/21/20 1000 07/21/20 1100  BP: 122/69  128/81 (!) 105/57  Pulse: 64 60 64 69  Resp: 18 17 (!) 21 (!) 21  Temp:      TempSrc:      SpO2: 98% 100% 95% 95%   No intake or output data in  the 24 hours ending 07/21/20 1110  No intake/output data recorded.  There were no vitals filed for this visit.  Physical Examination:    General-appears in no acute distress  Heart-S1-S2, regular, no murmur auscultated  Lungs-clear to auscultation bilaterally, no wheezing or crackles auscultated  Abdomen-soft, nontender, no organomegaly  Extremities-no edema in the lower extremities  Neuro-alert, oriented x3,  mild dysarthria, left-sided facial droop, moving all extremities, cranial nerves II through grossly intact   Status is: Inpatient  Dispo: The patient is from: Home              Anticipated d/c is to: Home              Anticipated d/c date is: 07/24/2020              Patient currently not stable for  discharge  Barrier to discharge-ongoing management for stroke          Studies:  CT Code Stroke CTA Head W/WO contrast  Result Date: 07/20/2020 CLINICAL DATA:  Left facial droop and slurred speech EXAM: CT ANGIOGRAPHY HEAD AND NECK TECHNIQUE: Multidetector CT imaging of the head and neck was performed using the standard protocol during bolus administration of intravenous contrast. Multiplanar CT image reconstructions and MIPs were obtained to evaluate the vascular anatomy. Carotid stenosis measurements (when applicable) are obtained utilizing NASCET criteria, using the distal internal carotid diameter as the denominator. CONTRAST:  10mL OMNIPAQUE IOHEXOL 350 MG/ML SOLN COMPARISON:  None. FINDINGS: CTA NECK FINDINGS SKELETON: There is no bony spinal canal stenosis. No lytic or blastic lesion. OTHER NECK: Normal pharynx, larynx and major salivary glands. No cervical lymphadenopathy. Unremarkable thyroid gland. UPPER CHEST: Small left pleural effusion. AORTIC ARCH: There is no calcific atherosclerosis of the aortic arch. There is no aneurysm, dissection or hemodynamically significant stenosis of the visualized portion of the aorta. Conventional 3 vessel aortic branching pattern. The visualized proximal subclavian arteries are widely patent. RIGHT CAROTID SYSTEM: No dissection, occlusion or aneurysm. There is low density atherosclerosis extending into the proximal ICA, resulting in less than 50% stenosis. LEFT CAROTID SYSTEM: No dissection, occlusion or aneurysm. Mild atherosclerotic calcification at the carotid bifurcation without hemodynamically significant stenosis. VERTEBRAL ARTERIES: Codominant configuration. Both origins are clearly patent. There is no dissection, occlusion or flow-limiting stenosis to the skull base (V1-V3 segments). CTA HEAD FINDINGS POSTERIOR CIRCULATION: --Vertebral arteries: Normal V4 segments. --Inferior cerebellar arteries: Normal. --Basilar artery: Normal. --Superior  cerebellar arteries: Normal. --Posterior cerebral arteries (PCA): Normal. ANTERIOR CIRCULATION: --Intracranial internal carotid arteries: Normal. --Anterior cerebral arteries (ACA): Normal. Both A1 segments are present. Patent anterior communicating artery (a-comm). --Middle cerebral arteries (MCA): Normal. VENOUS SINUSES: As permitted by contrast timing, patent. ANATOMIC VARIANTS: None Review of the MIP images confirms the above findings. IMPRESSION: 1. No intracranial arterial occlusion or high-grade stenosis. 2. Bilateral carotid bifurcation atherosclerosis without hemodynamically significant stenosis by NASCET criteria. 3. Small left pleural effusion. Electronically Signed   By: Deatra Robinson M.D.   On: 07/20/2020 23:38   CT Head Wo Contrast  Result Date: 07/20/2020 CLINICAL DATA:  Left-sided weakness with slurred speech EXAM: CT HEAD WITHOUT CONTRAST TECHNIQUE: Contiguous axial images were obtained from the base of the skull through the vertex without intravenous contrast. COMPARISON:  None. FINDINGS: Brain: No hemorrhage or intracranial mass. Small focus of hypodensity in the right white matter. Normal ventricle size. Vascular: No hyperdense vessels.  No unexpected calcification Skull: Normal. Negative for fracture or focal lesion. Sinuses/Orbits: Mucosal thickening or retention cysts in the  maxillary sinuses and ethmoid sinuses Other: None IMPRESSION: 1. Small focus of hypodensity in the right white matter, may represent age indeterminate lacunar infarct or age indeterminate small vessel ischemic change. 2. Otherwise no CT evidence for acute intracranial abnormality. Electronically Signed   By: Jasmine PangKim  Fujinaga M.D.   On: 07/20/2020 21:28   CT Code Stroke CTA Neck W/WO contrast  Result Date: 07/20/2020 CLINICAL DATA:  Left facial droop and slurred speech EXAM: CT ANGIOGRAPHY HEAD AND NECK TECHNIQUE: Multidetector CT imaging of the head and neck was performed using the standard protocol during bolus  administration of intravenous contrast. Multiplanar CT image reconstructions and MIPs were obtained to evaluate the vascular anatomy. Carotid stenosis measurements (when applicable) are obtained utilizing NASCET criteria, using the distal internal carotid diameter as the denominator. CONTRAST:  80mL OMNIPAQUE IOHEXOL 350 MG/ML SOLN COMPARISON:  None. FINDINGS: CTA NECK FINDINGS SKELETON: There is no bony spinal canal stenosis. No lytic or blastic lesion. OTHER NECK: Normal pharynx, larynx and major salivary glands. No cervical lymphadenopathy. Unremarkable thyroid gland. UPPER CHEST: Small left pleural effusion. AORTIC ARCH: There is no calcific atherosclerosis of the aortic arch. There is no aneurysm, dissection or hemodynamically significant stenosis of the visualized portion of the aorta. Conventional 3 vessel aortic branching pattern. The visualized proximal subclavian arteries are widely patent. RIGHT CAROTID SYSTEM: No dissection, occlusion or aneurysm. There is low density atherosclerosis extending into the proximal ICA, resulting in less than 50% stenosis. LEFT CAROTID SYSTEM: No dissection, occlusion or aneurysm. Mild atherosclerotic calcification at the carotid bifurcation without hemodynamically significant stenosis. VERTEBRAL ARTERIES: Codominant configuration. Both origins are clearly patent. There is no dissection, occlusion or flow-limiting stenosis to the skull base (V1-V3 segments). CTA HEAD FINDINGS POSTERIOR CIRCULATION: --Vertebral arteries: Normal V4 segments. --Inferior cerebellar arteries: Normal. --Basilar artery: Normal. --Superior cerebellar arteries: Normal. --Posterior cerebral arteries (PCA): Normal. ANTERIOR CIRCULATION: --Intracranial internal carotid arteries: Normal. --Anterior cerebral arteries (ACA): Normal. Both A1 segments are present. Patent anterior communicating artery (a-comm). --Middle cerebral arteries (MCA): Normal. VENOUS SINUSES: As permitted by contrast timing, patent.  ANATOMIC VARIANTS: None Review of the MIP images confirms the above findings. IMPRESSION: 1. No intracranial arterial occlusion or high-grade stenosis. 2. Bilateral carotid bifurcation atherosclerosis without hemodynamically significant stenosis by NASCET criteria. 3. Small left pleural effusion. Electronically Signed   By: Deatra RobinsonKevin  Herman M.D.   On: 07/20/2020 23:38   MR BRAIN WO CONTRAST  Result Date: 07/21/2020 CLINICAL DATA:  Slurred speech and ruling EXAM: MRI HEAD WITHOUT CONTRAST TECHNIQUE: Multiplanar, multiecho pulse sequences of the brain and surrounding structures were obtained without intravenous contrast. COMPARISON:  None. FINDINGS: Brain: There are areas of abnormal diffusion restriction in the right pons and at multiple locations in the bilateral frontal and parietal white matter. The largest of these lesions is in the paramedian superior left frontal lobe. No acute or chronic hemorrhage. Normal white matter signal, parenchymal volume and CSF spaces. The midline structures are normal. Vascular: Major flow voids are preserved. Skull and upper cervical spine: Normal calvarium and skull base. Visualized upper cervical spine and soft tissues are normal. Sinuses/Orbits:No paranasal sinus fluid levels or advanced mucosal thickening. No mastoid or middle ear effusion. Normal orbits. IMPRESSION: Acute right pontine infarct in addition to multiple small acute to early subacute infarcts within the bilateral frontal and parietal white matter, consistent with a central (cardiac or aortic) embolic source. No hemorrhage or mass effect. Electronically Signed   By: Deatra RobinsonKevin  Herman M.D.   On: 07/21/2020 00:01  Meredeth Ide   [facility] If 7PM-7AM, please contact night-coverage at [email or web address], Office  [phone]   07/21/2020, 11:10 AM  LOS: 1 day

## 2020-07-21 NOTE — ED Notes (Signed)
At this time the room is dirty and being cleaned. Will send patient up once room is clean.

## 2020-07-21 NOTE — ED Notes (Signed)
Lunch Tray Ordered @ 1006.  

## 2020-07-21 NOTE — Progress Notes (Signed)
[address]       [place] [address]             [phone]        ATZIN BUCHTA [facility] Medical Record #[id number] Date of Birth: July 29, 1965  Referring: No ref. provider found Primary Care: No primary care provider on file. Primary Cardiologist:Brian Jens Som, MD  Chief Complaint:    Chief Complaint  Patient presents with  . Code Stroke    History of Present Illness:     Patient noncontributory cardiac surgery service having had coronary artery bypass grafting on February 9.  He originally presented to [facility] health facility on [address]-with 2 days of left chest pain shoulder pain and back pain presenting with a blood pressure of 250/120.  Troponins were positive.  After 24-hour delay he was transferred to [facility] cardiac catheterization demonstrated cysts three-vessel coronary artery disease.  Surgery was uneventful making a quick recovery, he had no atrial fibrillation postop, was discharged home on aspirin and Plavix, and started on blood pressure medication which she had not previously been on.  2 days ago his wife noted some slight drooping of his left mouth, this became more pronounced yesterday evening with some drooling from the left side of his mouth and he was brought to the emergency room.   His wife has been encouraged to take his blood pressure after discharge notes that its been well controlled on current medications.   Current Activity/ Functional Status: Patient is independent with mobility/ambulation, transfers, ADL's, IADL's.   Zubrod Score: At the time of surgery this patient's most appropriate activity status/level should be described as: []     0    Normal activity, no symptoms [x]     1    Restricted in physical strenuous activity but ambulatory, able to do out light work []     2    Ambulatory and capable of self care, unable to do work activities, up and about                 more than 50%  Of the time                             []     3    Only limited self care, in bed greater than 50% of waking hours []     4    Completely disabled, no self care, confined to bed or chair []     5    Moribund  Past Medical History:  Diagnosis Date  . Glaucoma   . Hypertension     Past Surgical History:  Procedure Laterality Date  . CORONARY ARTERY BYPASS GRAFT N/A 07/08/2020   Procedure: CORONARY ARTERY BYPASS GRAFTING (CABG), ON PUMP, TIMES FOUR, LEFT INTERNAL MAMMARY ARTERY TO LAD, RIGHT SVG TO PDA, DISTAL CIRCUMFLEX, AND OM1;  Surgeon: , MD;  Location: [facility] OR;  Service: Open Heart Surgery;  Laterality: N/A;  . ENDOVEIN HARVEST OF GREATER SAPHENOUS VEIN Right 07/08/2020   Procedure: ENDOVEIN HARVEST OF RIGHT GREATER SAPHENOUS VEIN;  Surgeon: , MD;  Location: [facility] OR;  Service: Open Heart Surgery;  Laterality: Right;  . LEFT HEART CATH AND CORONARY ANGIOGRAPHY N/A 07/02/2020   Procedure: LEFT HEART CATH AND CORONARY ANGIOGRAPHY;  Surgeon: 09/05/2020, MD;  Location: [facility] INVASIVE CV LAB;  Service: Cardiovascular;  Laterality: N/A;  . NO PAST SURGERIES    .  TEE WITHOUT CARDIOVERSION N/A 07/08/2020   Procedure: TRANSESOPHAGEAL ECHOCARDIOGRAM (TEE);  Surgeon: Delight Ovens, MD;  Location: [facility] OR;  Service: Open Heart Surgery;  Laterality: N/A;    Social History   Tobacco Use  Smoking Status Former Smoker  Smokeless Tobacco Former Neurosurgeon    Social History   Substance and Sexual Activity  Alcohol Use Not Currently     No Known Allergies  Current Facility-Administered Medications  Medication Dose Route Frequency Provider Last Rate Last Admin  . aspirin EC tablet 81 mg  81 mg Oral Daily Bobette Mo, MD   81 mg at 07/21/20 2122  . atorvastatin (LIPITOR) tablet 80 mg  80 mg Oral Daily Bobette Mo, MD   80 mg at 07/21/20 4825  . clopidogrel (PLAVIX) tablet 75 mg  75 mg Oral Daily Bobette Mo, MD   75 mg at 07/21/20 0037  . enoxaparin (LOVENOX) injection 40 mg   40 mg Subcutaneous Q24H Bobette Mo, MD   40 mg at 07/21/20 0488  . LORazepam (ATIVAN) injection 1 mg  1 mg Intravenous Once PRN Bobette Mo, MD       Current Outpatient Medications  Medication Sig Dispense Refill  . acetaminophen (TYLENOL) 500 MG tablet Take 1,000 mg by mouth every 6 (six) hours as needed for mild pain (or discomfort).    Marland Kitchen aspirin EC 81 MG EC tablet Take 1 tablet (81 mg total) by mouth daily. Swallow whole. 30 tablet 11  . atorvastatin (LIPITOR) 80 MG tablet Take 1 tablet (80 mg total) by mouth daily. 30 tablet 1  . carvedilol (COREG) 6.25 MG tablet Take 1 tablet (6.25 mg total) by mouth 2 (two) times daily with a meal. 60 tablet 1  . clopidogrel (PLAVIX) 75 MG tablet Take 1 tablet (75 mg total) by mouth daily. 30 tablet 1  . losartan (COZAAR) 25 MG tablet Take 1 tablet (25 mg total) by mouth every evening. 30 tablet 1  . Multiple Vitamins-Minerals (MULTIVITAMIN WITH MINERALS) tablet Take 1 tablet by mouth daily.      (Not in a hospital admission)   Family History  Problem Relation Age of Onset  . Hypertension Father   . CAD Sister        diagnosed in her 56s  . Heart attack Sister      Review of Systems:   Pertinent items are noted in HPI.     Cardiac Review of Systems: Y or  [    ]= no  Chest Pain [   Post op incision  ]  Resting SOB [n   ] Exertional SOB  [n  ]  Orthopnea Milo.Brash  ]   Pedal Edema [ n  ]    Palpitations [ n ] Syncope  [ n ]   Presyncope [  n ]  General Review of Systems: [Y] = yes [  ]=no Constitional: recent weight change [  ]; anorexia [  ]; fatigue [  ]; nausea [  ]; night sweats [  ]; fever [  ]; or chills [  ]                                                               Dental: Last Dentist visit:   Eye :  blurred vision [  ]; diplopia [   ]; vision changes [  ];  Amaurosis fugax[  ]; Resp: cough [  ];  wheezing[  ];  hemoptysis[  ]; shortness of breath[  ]; paroxysmal nocturnal dyspnea[  ]; dyspnea on exertion[  ]; or  orthopnea[  ];  GI:  gallstones[  ], vomiting[  ];  dysphagia[  ]; melena[  ];  hematochezia [  ]; heartburn[  ];   Hx of  Colonoscopy[  ]; GU: kidney stones [  ]; hematuria[  ];   dysuria [  ];  nocturia[  ];  history of     obstruction [  ]; urinary frequency [  ]             Skin: rash, swelling[  ];, hair loss[  ];  peripheral edema[  ];  or itching[  ]; Musculosketetal: myalgias[  ];  joint swelling[  ];  joint erythema[  ];  joint pain[  ];  back pain[  ];  Heme/Lymph: bruising[  ];  bleeding[  ];  anemia[  ];  Neuro: TIA[  ];  headaches[  ];  stroke[y  ];  vertigo[  ];  seizures[  ];   paresthesias[  ];  difficulty walking[  ];  Psych:depression[  ]; anxiety[  ];  Endocrine: diabetes[ n ];  thyroid dysfunction[ n ];                  Physical Exam: BP 128/81   Pulse 64   Temp 98.4 F (36.9 C) (Oral)   Resp (!) 21   SpO2 95%    General appearance: alert, cooperative, appears stated age and no distress Head: Normocephalic, without obvious abnormality, atraumatic Neck: no adenopathy, no carotid bruit, no JVD, supple, symmetrical, trachea midline and thyroid not enlarged, symmetric, no tenderness/mass/nodules Lymph nodes: Cervical, supraclavicular, and axillary nodes normal. Resp: clear to auscultation bilaterally Cardio: regular rate and rhythm, S1, S2 normal, no murmur, click, rub or gallop GI: soft, non-tender; bowel sounds normal; no masses,  no organomegaly Extremities: extremities normal, atraumatic, no cyanosis or edema Neurologic: Alert and oriented X 3, normal strength and tone. Normal symmetric reflexes. Normal coordination and gait, slight drrop left side of mouth Sternotomy incision is healing well sternum is stable  Diagnostic Studies & Laboratory data:     Recent Radiology Findings:    CT Code Stroke CTA Head W/WO contrast  Result Date: 07/20/2020 CLINICAL DATA:  Left facial droop and slurred speech EXAM: CT ANGIOGRAPHY HEAD AND NECK TECHNIQUE: Multidetector CT  imaging of the head and neck was performed using the standard protocol during bolus administration of intravenous contrast. Multiplanar CT image reconstructions and MIPs were obtained to evaluate the vascular anatomy. Carotid stenosis measurements (when applicable) are obtained utilizing NASCET criteria, using the distal internal carotid diameter as the denominator. CONTRAST:  53mL OMNIPAQUE IOHEXOL 350 MG/ML SOLN COMPARISON:  None. FINDINGS: CTA NECK FINDINGS SKELETON: There is no bony spinal canal stenosis. No lytic or blastic lesion. OTHER NECK: Normal pharynx, larynx and major salivary glands. No cervical lymphadenopathy. Unremarkable thyroid gland. UPPER CHEST: Small left pleural effusion. AORTIC ARCH: There is no calcific atherosclerosis of the aortic arch. There is no aneurysm, dissection or hemodynamically significant stenosis of the visualized portion of the aorta. Conventional 3 vessel aortic branching pattern. The visualized proximal subclavian arteries are widely patent. RIGHT CAROTID SYSTEM: No dissection, occlusion or aneurysm. There is low density atherosclerosis extending into the proximal ICA, resulting in less  than 50% stenosis. LEFT CAROTID SYSTEM: No dissection, occlusion or aneurysm. Mild atherosclerotic calcification at the carotid bifurcation without hemodynamically significant stenosis. VERTEBRAL ARTERIES: Codominant configuration. Both origins are clearly patent. There is no dissection, occlusion or flow-limiting stenosis to the skull base (V1-V3 segments). CTA HEAD FINDINGS POSTERIOR CIRCULATION: --Vertebral arteries: Normal V4 segments. --Inferior cerebellar arteries: Normal. --Basilar artery: Normal. --Superior cerebellar arteries: Normal. --Posterior cerebral arteries (PCA): Normal. ANTERIOR CIRCULATION: --Intracranial internal carotid arteries: Normal. --Anterior cerebral arteries (ACA): Normal. Both A1 segments are present. Patent anterior communicating artery (a-comm). --Middle  cerebral arteries (MCA): Normal. VENOUS SINUSES: As permitted by contrast timing, patent. ANATOMIC VARIANTS: None Review of the MIP images confirms the above findings. IMPRESSION: 1. No intracranial arterial occlusion or high-grade stenosis. 2. Bilateral carotid bifurcation atherosclerosis without hemodynamically significant stenosis by NASCET criteria. 3. Small left pleural effusion. Electronically Signed   By: Deatra RobinsonKevin  Herman M.D.   On: 07/20/2020 23:38   CT Head Wo Contrast  Result Date: 07/20/2020 CLINICAL DATA:  Left-sided weakness with slurred speech EXAM: CT HEAD WITHOUT CONTRAST TECHNIQUE: Contiguous axial images were obtained from the base of the skull through the vertex without intravenous contrast. COMPARISON:  None. FINDINGS: Brain: No hemorrhage or intracranial mass. Small focus of hypodensity in the right white matter. Normal ventricle size. Vascular: No hyperdense vessels.  No unexpected calcification Skull: Normal. Negative for fracture or focal lesion. Sinuses/Orbits: Mucosal thickening or retention cysts in the maxillary sinuses and ethmoid sinuses Other: None IMPRESSION: 1. Small focus of hypodensity in the right white matter, may represent age indeterminate lacunar infarct or age indeterminate small vessel ischemic change. 2. Otherwise no CT evidence for acute intracranial abnormality. Electronically Signed   By: Jasmine PangKim  Fujinaga M.D.   On: 07/20/2020 21:28   CT Code Stroke CTA Neck W/WO contrast  Result Date: 07/20/2020 CLINICAL DATA:  Left facial droop and slurred speech EXAM: CT ANGIOGRAPHY HEAD AND NECK TECHNIQUE: Multidetector CT imaging of the head and neck was performed using the standard protocol during bolus administration of intravenous contrast. Multiplanar CT image reconstructions and MIPs were obtained to evaluate the vascular anatomy. Carotid stenosis measurements (when applicable) are obtained utilizing NASCET criteria, using the distal internal carotid diameter as the  denominator. CONTRAST:  80mL OMNIPAQUE IOHEXOL 350 MG/ML SOLN COMPARISON:  None. FINDINGS: CTA NECK FINDINGS SKELETON: There is no bony spinal canal stenosis. No lytic or blastic lesion. OTHER NECK: Normal pharynx, larynx and major salivary glands. No cervical lymphadenopathy. Unremarkable thyroid gland. UPPER CHEST: Small left pleural effusion. AORTIC ARCH: There is no calcific atherosclerosis of the aortic arch. There is no aneurysm, dissection or hemodynamically significant stenosis of the visualized portion of the aorta. Conventional 3 vessel aortic branching pattern. The visualized proximal subclavian arteries are widely patent. RIGHT CAROTID SYSTEM: No dissection, occlusion or aneurysm. There is low density atherosclerosis extending into the proximal ICA, resulting in less than 50% stenosis. LEFT CAROTID SYSTEM: No dissection, occlusion or aneurysm. Mild atherosclerotic calcification at the carotid bifurcation without hemodynamically significant stenosis. VERTEBRAL ARTERIES: Codominant configuration. Both origins are clearly patent. There is no dissection, occlusion or flow-limiting stenosis to the skull base (V1-V3 segments). CTA HEAD FINDINGS POSTERIOR CIRCULATION: --Vertebral arteries: Normal V4 segments. --Inferior cerebellar arteries: Normal. --Basilar artery: Normal. --Superior cerebellar arteries: Normal. --Posterior cerebral arteries (PCA): Normal. ANTERIOR CIRCULATION: --Intracranial internal carotid arteries: Normal. --Anterior cerebral arteries (ACA): Normal. Both A1 segments are present. Patent anterior communicating artery (a-comm). --Middle cerebral arteries (MCA): Normal. VENOUS SINUSES: As permitted by contrast timing, patent. ANATOMIC  VARIANTS: None Review of the MIP images confirms the above findings. IMPRESSION: 1. No intracranial arterial occlusion or high-grade stenosis. 2. Bilateral carotid bifurcation atherosclerosis without hemodynamically significant stenosis by NASCET criteria. 3.  Small left pleural effusion. Electronically Signed   By: Deatra Robinson M.D.   On: 07/20/2020 23:38   MR BRAIN WO CONTRAST  Result Date: 07/21/2020 CLINICAL DATA:  Slurred speech and ruling EXAM: MRI HEAD WITHOUT CONTRAST TECHNIQUE: Multiplanar, multiecho pulse sequences of the brain and surrounding structures were obtained without intravenous contrast. COMPARISON:  None. FINDINGS: Brain: There are areas of abnormal diffusion restriction in the right pons and at multiple locations in the bilateral frontal and parietal white matter. The largest of these lesions is in the paramedian superior left frontal lobe. No acute or chronic hemorrhage. Normal white matter signal, parenchymal volume and CSF spaces. The midline structures are normal. Vascular: Major flow voids are preserved. Skull and upper cervical spine: Normal calvarium and skull base. Visualized upper cervical spine and soft tissues are normal. Sinuses/Orbits:No paranasal sinus fluid levels or advanced mucosal thickening. No mastoid or middle ear effusion. Normal orbits. IMPRESSION: Acute right pontine infarct in addition to multiple small acute to early subacute infarcts within the bilateral frontal and parietal white matter, consistent with a central (cardiac or aortic) embolic source. No hemorrhage or mass effect. Electronically Signed   By: Deatra Robinson M.D.   On: 07/21/2020 00:01     I have independently reviewed the above radiologic studies and discussed with the patient   Recent Lab Findings: Lab Results  Component Value Date   WBC 10.0 07/20/2020   HGB 12.6 (L) 07/20/2020   HCT 37.0 (L) 07/20/2020   PLT 363 07/20/2020   GLUCOSE 102 (H) 07/20/2020   CHOL 103 07/21/2020   TRIG 148 07/21/2020   HDL 24 (L) 07/21/2020   LDLCALC 49 07/21/2020   ALT 17 07/20/2020   AST 20 07/20/2020   NA 138 07/20/2020   K 4.6 07/20/2020   CL 102 07/20/2020   CREATININE 1.40 (H) 07/20/2020   BUN 30 (H) 07/20/2020   CO2 26 07/20/2020   INR 1.1  07/20/2020   HGBA1C 5.6 07/21/2020   TEE done intar op 2/9 INTRAOPERATIVE TRANSESOPHAGEAL REPORT *      Patient Name:  Leonard Donovan Date of Exam: 07/08/2020  Medical Rec #: [id number]   Height:    69.0 in  Accession #:  [id number]   Weight:    209.8 lb  Date of Birth: Apr 07, 1966   BSA:     2.11 m  Patient Age:  54 years    BP:      150/83 mmHg  Patient Gender: M       HR:      51 bpm.  Exam Location: Anesthesiology   Transesophogeal exam was perform intraoperatively during surgical  procedure.  Patient was closely monitored under general anesthesia during the entirety  of  examination.   Indications:   CAD Native Vessel i25.10  Sonographer:   Irving Burton Senior RDCS  Performing Phys: [id number] Daily [id number]  Diagnosing Phys: Kipp Brood MD   Complications: No known complications during this procedure.  PRE-OP FINDINGS  Left Ventricle: Marland Kitchen There was normal LV systolic function with the ejection  fraction estimated at 55-60% using the 3DQ volumetric package. There were  no regional wall motion abnormalities. There was moderate concentric left  ventricular hypertrophy with  the LV wall thickness 1.15-1.20 cm.   On the post-bypass exam, the LV  systolic function was normal and unchanged  from the pre-bypass exam.   Right Ventricle: The right ventricle has normal systolic function. The  cavity was normal. There is no increase in right ventricular wall  thickness. On the post-bypass exam, the RV systolic function was normal  and unchanged from the pre-bypass study.   Left Atrium: Left atrial size was dilated. The left atrial appendage is  well visualized and there is evidence of thrombus present. Left atrial  appendage velocity is normal at greater than 40 cm/s.   Right Atrium: Right atrial size was dilated.   Interatrial Septum: No atrial level shunt detected by color flow Doppler.   Pericardium: There is no evidence of  pericardial effusion.   Mitral Valve: The mitral valve is normal in structure. Mild thickening of  the mitral valve leaflet. Mitral valve regurgitation is trivial by color  flow Doppler. There is No evidence of mitral stenosis.   Tricuspid Valve: The tricuspid valve was normal in structure. Tricuspid  valve regurgitation is trivial by color flow Doppler.   Aortic Valve: The aortic valve is tricuspid Aortic valve regurgitation was  not visualized by color flow Doppler. There is no evidence of aortic valve  stenosis. There is no evidence of a vegetation on the aortic valve.   Pulmonic Valve: The pulmonic valve was normal in structure, with normal.  No evidence of pumonic stenosis.  Pulmonic valve regurgitation is trivial by color flow Doppler.    Aorta: The ascending aorta was normal in diameter with a well defined  aortic root and sinotubular junction without dilataion or effacement. The  aortic root at the sinuses of valsalva was 2.98 cm The STJ was 2.91 cm.  The proximal ascending aorta was 2.93  cm. There was normal wall thickness of the ascending aorta.   The descending aorta was normal in diameter and measured . There was grade  3-4 atherosclerotic plaque within the descending aorta was 2.03 cm in  diameter.   +--------------+-------++  LEFT VENTRICLE      +--------------+-------++  PLAX 2D         +------------+---------++  +--------------+-------++ 3D Volume EF       LVIDd:    4.80 cm +------------+---------++  +--------------+-------++ LV 3D EDV: 126.87 ml  LVIDs:    3.70 cm +------------+---------++  +--------------+-------++ LV 3D ESV: 50.92 ml   LV PW:    1.60 cm +------------+---------++  +--------------+-------++  LV IVS:    0.80 cm  +--------------+-------++  LV SV:    49 ml   +--------------+-------++  LV SV Index: 22.66   +--------------+-------++                +--------------+-------++   +--------------+----------++  MITRAL VALVE        +--------------+----------++  MV Area (PHT):2.96 cm   +--------------+----------++  MV PHT:    74.24 msec  +--------------+----------++  MV Decel Time:256 msec   +--------------+----------++  +--------------+----------++  MV E velocity:78.90 cm/s  +--------------+----------++  MV A velocity:46.70 cm/s  +--------------+----------++  MV E/A ratio: 1.69     +--------------+----------++     Kipp Brood MD  Electronically signed by Kipp Brood MD  Signature Date/Time: 07/08/2020/6:45:51 PM     Assessment / Plan:   #1Acute right pontine infarct in addition to multiple small acute to early subacute infarcts within the bilateral frontal and parietal white matter, consistent with a central (cardiac or aortic) embolic source. #2 recently status post coronary artery bypass grafting on February 9-home on Plavix and aspirin, no  history of postop atrial fib #3 significant left ventricular hypertrophy noted on intraoperative TEE and at the time of surgery #4 essential hypertension-newly started on therapy-   No PFO noted on intraoperative TEE     Delight Ovens MD      [address] [place] [place] [address] Office [phone]     07/21/2020 10:46 AM

## 2020-07-22 ENCOUNTER — Other Ambulatory Visit: Payer: Self-pay | Admitting: Cardiology

## 2020-07-22 DIAGNOSIS — I639 Cerebral infarction, unspecified: Secondary | ICD-10-CM

## 2020-07-22 DIAGNOSIS — I10 Essential (primary) hypertension: Secondary | ICD-10-CM | POA: Diagnosis not present

## 2020-07-22 LAB — BASIC METABOLIC PANEL
Anion gap: 11 (ref 5–15)
BUN: 20 mg/dL (ref 6–20)
CO2: 26 mmol/L (ref 22–32)
Calcium: 8.9 mg/dL (ref 8.9–10.3)
Chloride: 103 mmol/L (ref 98–111)
Creatinine, Ser: 1.23 mg/dL (ref 0.61–1.24)
GFR, Estimated: >60 mL/min (ref >60)
Glucose, Bld: 95 mg/dL (ref 70–99)
Potassium: 4.3 mmol/L (ref 3.5–5.1)
Sodium: 140 mmol/L (ref 135–145)

## 2020-07-22 LAB — CBC
HCT: 33.8 % — ABNORMAL LOW (ref 39.0–52.0)
Hemoglobin: 11.5 g/dL — ABNORMAL LOW (ref 13.0–17.0)
MCH: 29.6 pg (ref 26.0–34.0)
MCHC: 34 g/dL (ref 30.0–36.0)
MCV: 87.1 fL (ref 80.0–100.0)
Platelets: 285 10*3/uL (ref 150–400)
RBC: 3.88 MIL/uL — ABNORMAL LOW (ref 4.22–5.81)
RDW: 13.9 % (ref 11.5–15.5)
WBC: 7.2 10*3/uL (ref 4.0–10.5)
nRBC: 0 % (ref 0.0–0.2)

## 2020-07-22 MED ORDER — ACETAMINOPHEN 500 MG PO TABS: 1000.0000 mg | ORAL_TABLET | Freq: Three times a day (TID) | ORAL | 0 refills | Status: AC | PRN

## 2020-07-22 NOTE — Evaluation (Signed)
Occupational Therapy Evaluation Patient Details Name: Leonard Donovan MRN: [id number] DOB: 1965-12-14 Today's Date: 07/22/2020    History of Present Illness Pt is a 55 y/o male admitted secondary to L sided facial droop and slurred speech. 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 Per notes possibly cardioembolic. Pt is recently s/p CABG. PMH includes CAD, HTN, glaucoma, and COVID.   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 Patient s/p CABG 2 weeks ago and continues to follow sternal precautions. Patient demonstrates ability to complete observed ADLs this date with Mod I to I and able to recall 3/3 words on BIMs after 3 minute delay. Patient does note continued paresthesia on L side of face and some continued memory deficits noted to be affecting higher level executive functioning. Patient does not require continued acute occupational services with OT to sign off at this time.     Follow Up Recommendations  No OT follow up;Supervision - Intermittent    Equipment Recommendations  None recommended by OT    Recommendations for Other Services       Precautions / Restrictions Precautions Precautions: Sternal Precaution Booklet Issued: No Precaution Comments: Pt able to recall sternal precautions. Restrictions Weight Bearing Restrictions: No      Mobility Bed Mobility Overal bed mobility: Modified Independent             General bed mobility comments: Demonstrated appropriate log roll technique. Increased time required from stretcher    Transfers Overall transfer level: Independent                    Balance Overall balance assessment: No apparent balance deficits (not formally assessed)                                         ADL either performed or assessed with clinical judgement   ADL Overall ADL's :  At baseline                                       General ADL Comments: Demonstrates all observed ADLs with Mod I and adherence to sternal preacutions.     Vision Baseline Vision/History: No visual deficits Patient Visual Report: No change from baseline Vision Assessment?: No apparent visual deficits     Perception     Praxis      Pertinent Vitals/Pain Pain Assessment: No/denies pain     Hand Dominance Right   Extremity/Trunk Assessment Upper Extremity Assessment Upper Extremity Assessment: Overall WFL for tasks assessed (Notes continued paresthesia to L side of face.)   Lower Extremity Assessment Lower Extremity Assessment: Defer to PT evaluation   Cervical / Trunk Assessment Cervical / Trunk Assessment: Normal   Communication Communication Communication: No difficulties   Cognition Arousal/Alertness: Awake/alert Behavior During Therapy: WFL for tasks assessed/performed Overall Cognitive Status: Within Functional Limits for tasks assessed                                 General Comments: A&Ox4. Able to recall 3/3 words on BIMs with 3 minute delay.   General Comments  Incision at sternum 2/2 CABG  2 weeks ago. Wife present at bedside.    Exercises     Shoulder Instructions      Home Living Family/patient expects to be discharged to:: Private residence Living Arrangements: Spouse/significant other Available Help at Discharge: Family Type of Home: House Home Access: Stairs to enter Secretary/administrator of Steps: 4 Entrance Stairs-Rails: None Home Layout: One level     Bathroom Shower/Tub: Walk-in shower;Tub/shower unit   Firefighter: Standard     Home Equipment: None      Lives With: Spouse (2 kids at home)    Prior Functioning/Environment Level of Independence: Independent        Comments: Working as a Music therapist.        OT Problem List:        OT Treatment/Interventions:      OT Goals(Current goals  can be found in the care plan section) Acute Rehab OT Goals Patient Stated Goal: to go home OT Goal Formulation: With patient  OT Frequency:     Barriers to D/C:            Co-evaluation              AM-PAC OT "6 Clicks" Daily Activity     Outcome Measure Help from another person eating meals?: None Help from another person taking care of personal grooming?: None Help from another person toileting, which includes using toliet, bedpan, or urinal?: None Help from another person bathing (including washing, rinsing, drying)?: None Help from another person to put on and taking off regular upper body clothing?: None Help from another person to put on and taking off regular lower body clothing?: None 6 Click Score: 24   End of Session Equipment Utilized During Treatment: Gait belt  Activity Tolerance: Patient tolerated treatment well Patient left: in chair;with call bell/phone within reach;with family/visitor present                   Time: 3810-1751 OT Time Calculation (min): 12 min Charges:  OT General Charges $OT Visit: 1 Visit OT Evaluation $OT Eval Low Complexity: 1 Low  Kana Reimann H. OTR/L Supplemental OT, Department of rehab services [phone]  Chihiro Frey R H. 07/22/2020, 11:40 AM

## 2020-07-22 NOTE — Plan of Care (Signed)
Adequate for discharge.

## 2020-07-22 NOTE — Progress Notes (Signed)
      [address]       [place] [id number]             [phone]         Subjective: Feels good this morning and hopes to go home soon. He has no residual weakness and does not need rehab or home health.   Objective: Vital signs in last 24 hours: Temp:  [97.9 F (36.6 C)-98.8 F (37.1 C)] 97.9 F (36.6 C) (02/23 0805) Pulse Rate:  [55-72] 65 (02/23 0805) Cardiac Rhythm: Normal sinus rhythm (02/23 0801) Resp:  [17-21] 18 (02/23 0805) BP: (101-153)/(57-90) 122/84 (02/23 0805) SpO2:  [94 %-100 %] 97 % (02/23 0805) Weight:  [92.9 kg] 92.9 kg (02/22 1234)     Intake/Output from previous day: 02/22 0701 - 02/23 0700 In: 511.4 [I.V.:511.4] Out: -  Intake/Output this shift: No intake/output data recorded.  General appearance: alert, cooperative and no distress Neurologic: intact and no residual weakness, + left facial droop Heart: regular rate and rhythm, S1, S2 normal, no murmur, click, rub or gallop Lungs: clear to auscultation bilaterally Abdomen: soft, non-tender; bowel sounds normal; no masses,  no organomegaly Extremities: extremities normal, atraumatic, no cyanosis or edema Wound: clean and dry, no erythema   Lab Results: Recent Labs    07/20/20 1945 07/20/20 1949 07/22/20 0350  WBC 10.0  --  7.2  HGB 12.1* 12.6* 11.5*  HCT 38.3* 37.0* 33.8*  PLT 363  --  285   BMET:  Recent Labs    07/20/20 1945 07/20/20 1949 07/22/20 0350  NA 137 138 140  K 4.7 4.6 4.3  CL 102 102 103  CO2 26  --  26  GLUCOSE 105* 102* 95  BUN 27* 30* 20  CREATININE 1.38* 1.40* 1.23  CALCIUM 9.2  --  8.9    PT/INR:  Recent Labs    07/20/20 1945  LABPROT 14.2  INR 1.1   ABG    Component Value Date/Time   PHART 7.358 07/08/2020 1938   HCO3 20.8 07/08/2020 1938   TCO2 25 07/20/2020 1949   ACIDBASEDEF 4.0 (H) 07/08/2020 1938   O2SAT 93.0 07/08/2020 1938   CBG (last 3)  No results for input(s): GLUCAP in the last 72 hours.  Assessment/Plan:  1.  Acute right pontine infarct in addition to early subacute infarcts within th e bilateral frontal and parietal white matter. Echo showed no PFO or other intracardiac source of embolism detected. He does not have any residual symptoms other than a slight left facial droop.  2. S/p CABG on 2/9 sent home on Plavix/asa, no hx of atrial fibrillation. 3. Hypertensive on admission with recorded BP of 250/120. He was on cozaar and coreg at home. Would suggest resuming these medications unless there is a contraindication.  4. Renal function has been stable, H and H stable at 11.5/33.8 5. Incisions are clean and dry. He still has some tenderness in his left wrist from where the a-line was present. No signs erythema, ecchymosis, or edema  Plan: Hopefully home soon. He does not have any rehab needs at this time. He will go home on his plavix/asa. Please follow-up on 3/17 with Dr. Tyrone Sage.    LOS: 2 days    Sharlene Dory 07/22/2020

## 2020-07-22 NOTE — Discharge Summary (Signed)
Physician Discharge Summary  Leonard Donovan GNF:[id number] DOB: [birth year] DOA: 07/20/2020  PCP: No primary care provider on file.  Admit date: 07/20/2020 Discharge date: 07/22/2020  Time spent: 40 minutes  Recommendations for Outpatient Follow-up:  1. Follow outpatient CBC/CMP 2. Follow neurology outpatient 3. Follow cardiology outpatient  4. Follow up with CT surgery as scheduled  5. Follow up cardiac event monitor outpatient 6. Follow with outpatient speech therapy 7. Follow kidney function/electrolytes outaptient   Discharge Diagnoses:  Principal Problem:   Acute CVA (cerebrovascular accident) [facility]) Active Problems:   Coronary artery disease   S/P CABG x 4   Hypertension   CVA (cerebral vascular accident) [facility])   Discharge Condition: stable  Diet recommendation: heart healthy  Filed Weights   07/21/20 1234  Weight: 92.9 kg    History of present illness:  55 year old male with medical history of glaucoma, hypertension, COVID-19 infection last month, former smoker, CAD s/p CABG on 07/08/2020 came to ED with complaints of left facial droop, slurred speech and left upper extremity weakness.  Patient's wife states that she noticed the symptoms 2 days after CABG but thought that these were from the pain medications. 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  No hemorrhage or mass-effect. Neurology was consulted.  He was seen for multifocal infarcts, likely related to recent cardiac surgery.  Plan for outpatient 30 day monitor.  Continue DAPT.  See below for additional recs.  Hospital Course:  1. Acute CVA 1. MRI brain R pontine infarct with multiple small acute to early subacute infarcts within the bilateral frontal and parietal white matter c/w central embolic source 2. CTA head/neck without intracranial arterial  occlusion or high grade stenosis, bilateral carotid birurcation atherosclerosis without hemodynamically significant stenosis 3. Neurology has been consulted.  Continue aspirin, Plavix, lipitor. Recommending 30 day monitor to evaluate for Adonias Demore fib.   4. Follow echocardiogram (no intracardiac source of embolism detected on transthoracic study (see report).  A1c 5.6.  LDL 49.        2. CAD s/p CABG x4-continue DAPT, Coreg on hold.  Continue atorvastatin.  CT surgery has seen the patient in the ED.  3. Hypertension-Coreg, losartan on hold for permissive hypertension. resume on d/c (discussed with neuro)  4. Acute kidney injury - baseline creatinine fluctuates from ~1.1 to ~0.7.  Presented with mild AKI.  HCTZ and losartan held on admission.  Creatinine improved to 1.2 at discharge.  Ok to resume BP meds - need to follow outpatient and adjust as needed for renal function.  Procedures: Echo  IMPRESSIONS    1. Left ventricular ejection fraction, by estimation, is 60 to 65%. The  left ventricle has normal function. The left ventricle has no regional  wall motion abnormalities. There is mild concentric left ventricular  hypertrophy. Left ventricular diastolic  parameters are indeterminate.  2. Right ventricular systolic function is low normal. The right  ventricular size is normal.  3. Left atrial size was mildly dilated.  4. The mitral valve is normal in structure. Trivial mitral valve  regurgitation.  5. The aortic valve is tricuspid. Aortic valve regurgitation is not  visualized. No aortic stenosis is present.  6. The inferior vena cava is normal in size with greater than 50%  respiratory variability, suggesting right atrial pressure of 3 mmHg.   Comparison(s): No significant change from prior study.   Conclusion(s)/Recommendation(s):  No intracardiac source of embolism  detected on this transthoracic study. Leonard Donovan transesophageal echocardiogram is  recommended to exclude cardiac source of  embolism if clinically indicated.   Consultations:  CT surgery  neurology  Discharge Exam: Vitals:   07/22/20 0805 07/22/20 1107  BP: 122/84 (!) 136/92  Pulse: 65 (!) 56  Resp: 18 20  Temp: 97.9 F (36.6 C) 97.7 F (36.5 C)  SpO2: 97% 100%   No new complaints Wife at bedside Eager for d/c Discussed d/c recs  General: No acute distress. Cardiovascular: Heart sounds show Leonard Donovan regular rate, and rhythm.  Lungs: Clear to auscultation bilaterally  Abdomen: Soft, nontender, nondistended  Neurological: Alert and oriented 3. Moves all extremities 4 with equal strength. Mild L sided facial droop.  Mildly dysarthric.  Skin: Warm and dry. No rashes or lesions. Extremities: No clubbing or cyanosis. No edema.   Discharge Instructions   Discharge Instructions    Ambulatory referral to Neurology   Complete by: As directed    Follow up with stroke clinic NP (Leonard Donovan or Leonard Donovan, if both not available, consider Leonard Donovan, or Leonard Donovan) at [facility] in about 4 weeks. Thanks.   Ambulatory referral to Speech Therapy   Complete by: As directed    Call MD for:  difficulty breathing, headache or visual disturbances   Complete by: As directed    Call MD for:  extreme fatigue   Complete by: As directed    Call MD for:  hives   Complete by: As directed    Call MD for:  persistant dizziness or light-headedness   Complete by: As directed    Call MD for:  persistant nausea and vomiting   Complete by: As directed    Call MD for:  redness, tenderness, or signs of infection (pain, swelling, redness, odor or green/yellow discharge around incision site)   Complete by: As directed    Call MD for:  severe uncontrolled pain   Complete by: As directed    Call MD for:  temperature >100.4   Complete by: As directed    Diet - low sodium heart healthy   Complete by: As directed    Discharge instructions   Complete by: As directed    You were seen for Leonard Donovan strokes.  This is likely due to  your recent cardiac surgery.  Continue aspirin and plavix as well as atorvastatin.  You'll need outpatient cardiac monitoring (30 day event monitor).  Cardiology should call you to schedule this.  You should follow up with neurology outpatient.  Your blood pressures were reasonable here.  Follow your blood pressures outpatient.  You'll need repeat labs within 1-2 weeks to check your kidney function and electrolytes.    Return for new, recurrent, or worsening symptoms.  Please ask your PCP to request records from this hospitalization so they know what was done and what the next steps will be.   Increase activity slowly   Complete by: As directed    No wound care   Complete by: As directed      Allergies as of 07/22/2020   No Known Allergies     Medication List    STOP taking these medications   traMADol 50 MG tablet Commonly known as: ULTRAM     TAKE these medications   acetaminophen 500 MG tablet Commonly known as: TYLENOL Take 2 tablets (1,000 mg total) by mouth every 8 (eight) hours as needed for mild pain (or discomfort). What changed: when to take this  aspirin 81 MG EC tablet Take 1 tablet (81 mg total) by mouth daily. Swallow whole.   atorvastatin 80 MG tablet Commonly known as: LIPITOR Take 1 tablet (80 mg total) by mouth daily.   carvedilol 6.25 MG tablet Commonly known as: COREG Take 1 tablet (6.25 mg total) by mouth 2 (two) times daily with Karanveer Ramakrishnan meal.   clopidogrel 75 MG tablet Commonly known as: PLAVIX Take 1 tablet (75 mg total) by mouth daily.   losartan 25 MG tablet Commonly known as: COZAAR Take 1 tablet (25 mg total) by mouth every evening.   multivitamin with minerals tablet Take 1 tablet by mouth daily.      No Known Allergies  Follow-up Information    [facility]. Schedule an appointment as soon as possible for Leonard Donovan visit in 4 week(s).   Specialty: Neurology Contact information: [address] [place]  [place] [address] [phone]       [facility] Follow up.   Why: The outpatient therapy will contact you for the first appointment Contact information: [address] [place], [place] [address]  [phone]               The results of significant diagnostics from this hospitalization (including imaging, microbiology, ancillary and laboratory) are listed below for reference.    Significant Diagnostic Studies: CT Code Stroke CTA Head W/WO contrast  Result Date: 07/20/2020 CLINICAL DATA:  Left facial droop and slurred speech EXAM: CT ANGIOGRAPHY HEAD AND NECK TECHNIQUE: Multidetector CT imaging of the head and neck was performed using the standard protocol during bolus administration of intravenous contrast. Multiplanar CT image reconstructions and MIPs were obtained to evaluate the vascular anatomy. Carotid stenosis measurements (when applicable) are obtained utilizing NASCET criteria, using the distal internal carotid diameter as the denominator. CONTRAST:  80mL OMNIPAQUE IOHEXOL 350 MG/ML SOLN COMPARISON:  None. FINDINGS: CTA NECK FINDINGS SKELETON: There is no bony spinal canal stenosis. No lytic or blastic lesion. OTHER NECK: Normal pharynx, larynx and major salivary glands. No cervical lymphadenopathy. Unremarkable thyroid gland. UPPER CHEST: Small left pleural effusion. AORTIC ARCH: There is no calcific atherosclerosis of the aortic arch. There is no aneurysm, dissection or hemodynamically significant stenosis of the visualized portion of the aorta. Conventional 3 vessel aortic branching pattern. The visualized proximal subclavian arteries are widely patent. RIGHT CAROTID SYSTEM: No dissection, occlusion or aneurysm. There is low density atherosclerosis extending into the proximal ICA, resulting in less than 50% stenosis. LEFT CAROTID SYSTEM: No dissection, occlusion or aneurysm. Mild atherosclerotic calcification at the carotid bifurcation without hemodynamically  significant stenosis. VERTEBRAL ARTERIES: Codominant configuration. Both origins are clearly patent. There is no dissection, occlusion or flow-limiting stenosis to the skull base (V1-V3 segments). CTA HEAD FINDINGS POSTERIOR CIRCULATION: --Vertebral arteries: Normal V4 segments. --Inferior cerebellar arteries: Normal. --Basilar artery: Normal. --Superior cerebellar arteries: Normal. --Posterior cerebral arteries (PCA): Normal. ANTERIOR CIRCULATION: --Intracranial internal carotid arteries: Normal. --Anterior cerebral arteries (ACA): Normal. Both A1 segments are present. Patent anterior communicating artery (Jariah Jarmon-comm). --Middle cerebral arteries (MCA): Normal. VENOUS SINUSES: As permitted by contrast timing, patent. ANATOMIC VARIANTS: None Review of the MIP images confirms the above findings. IMPRESSION: 1. No intracranial arterial occlusion or high-grade stenosis. 2. Bilateral carotid bifurcation atherosclerosis without hemodynamically significant stenosis by NASCET criteria. 3. Small left pleural effusion. Electronically Signed   By: Deatra Robinson M.D.   On: 07/20/2020 23:38   DG Chest 2 View  Result Date: 07/12/2020 CLINICAL DATA:  Status post CABG.  EXAM: CHEST - 2 VIEW COMPARISON:  Chest x-ray from yesterday. FINDINGS: Interval removal of the right internal jugular sheath. Stable cardiomegaly status post CABG. Normal pulmonary vascularity. Slightly improved mild bibasilar atelectasis. Unchanged small bilateral pleural effusions. No pneumothorax. No acute osseous abnormality. IMPRESSION: 1. Slightly improved bibasilar atelectasis. Unchanged small bilateral pleural effusions. Electronically Signed   By: Obie DredgeWilliam T Derry M.D.   On: 07/12/2020 07:18   DG Chest 2 View  Result Date: 07/07/2020 CLINICAL DATA:  Coronary artery disease. Preoperative coronary artery bypass grafting. Chest pain. EXAM: CHEST - 2 VIEW COMPARISON:  July 01, 2020 FINDINGS: There is slight scarring in the upper lobe regions. There is no  edema or airspace opacity. Heart is upper normal in size with pulmonary vascularity normal. No adenopathy. There is degenerative change in the thoracic spine. There is slight anterior wedging of Blessyn Sommerville lower thoracic vertebral body. IMPRESSION: Slight scarring in the upper lobes. No edema or airspace opacity. Heart upper normal in size. Electronically Signed   By: Bretta BangWilliam  Woodruff III M.D.   On: 07/07/2020 08:08   DG Chest 2 View  Result Date: 07/01/2020 CLINICAL DATA:  Chest pain.  Shortness of breath EXAM: CHEST - 2 VIEW COMPARISON:  None. FINDINGS: Heart size is normal. No pleural effusion or edema. Bilateral upper lobe predominant hazy opacities are identified. Subsegmental atelectasis noted in the left base. The visualized osseous structures are unremarkable. IMPRESSION: Bilateral upper lobe predominant hazy opacities concerning for multifocal infection. Electronically Signed   By: Signa Kellaylor  Stroud M.D.   On: 07/01/2020 18:01   CT Head Wo Contrast  Result Date: 07/20/2020 CLINICAL DATA:  Left-sided weakness with slurred speech EXAM: CT HEAD WITHOUT CONTRAST TECHNIQUE: Contiguous axial images were obtained from the base of the skull through the vertex without intravenous contrast. COMPARISON:  None. FINDINGS: Brain: No hemorrhage or intracranial mass. Small focus of hypodensity in the right white matter. Normal ventricle size. Vascular: No hyperdense vessels.  No unexpected calcification Skull: Normal. Negative for fracture or focal lesion. Sinuses/Orbits: Mucosal thickening or retention cysts in the maxillary sinuses and ethmoid sinuses Other: None IMPRESSION: 1. Small focus of hypodensity in the right white matter, may represent age indeterminate lacunar infarct or age indeterminate small vessel ischemic change. 2. Otherwise no CT evidence for acute intracranial abnormality. Electronically Signed   By: Jasmine PangKim  Fujinaga M.D.   On: 07/20/2020 21:28   CT Code Stroke CTA Neck W/WO contrast  Result Date:  07/20/2020 CLINICAL DATA:  Left facial droop and slurred speech EXAM: CT ANGIOGRAPHY HEAD AND NECK TECHNIQUE: Multidetector CT imaging of the head and neck was performed using the standard protocol during bolus administration of intravenous contrast. Multiplanar CT image reconstructions and MIPs were obtained to evaluate the vascular anatomy. Carotid stenosis measurements (when applicable) are obtained utilizing NASCET criteria, using the distal internal carotid diameter as the denominator. CONTRAST:  80mL OMNIPAQUE IOHEXOL 350 MG/ML SOLN COMPARISON:  None. FINDINGS: CTA NECK FINDINGS SKELETON: There is no bony spinal canal stenosis. No lytic or blastic lesion. OTHER NECK: Normal pharynx, larynx and major salivary glands. No cervical lymphadenopathy. Unremarkable thyroid gland. UPPER CHEST: Small left pleural effusion. AORTIC ARCH: There is no calcific atherosclerosis of the aortic arch. There is no aneurysm, dissection or hemodynamically significant stenosis of the visualized portion of the aorta. Conventional 3 vessel aortic branching pattern. The visualized proximal subclavian arteries are widely patent. RIGHT CAROTID SYSTEM: No dissection, occlusion or aneurysm. There is low density atherosclerosis extending into the proximal ICA, resulting in less  than 50% stenosis. LEFT CAROTID SYSTEM: No dissection, occlusion or aneurysm. Mild atherosclerotic calcification at the carotid bifurcation without hemodynamically significant stenosis. VERTEBRAL ARTERIES: Codominant configuration. Both origins are clearly patent. There is no dissection, occlusion or flow-limiting stenosis to the skull base (V1-V3 segments). CTA HEAD FINDINGS POSTERIOR CIRCULATION: --Vertebral arteries: Normal V4 segments. --Inferior cerebellar arteries: Normal. --Basilar artery: Normal. --Superior cerebellar arteries: Normal. --Posterior cerebral arteries (PCA): Normal. ANTERIOR CIRCULATION: --Intracranial internal carotid arteries: Normal.  --Anterior cerebral arteries (ACA): Normal. Both A1 segments are present. Patent anterior communicating artery (Althia Egolf-comm). --Middle cerebral arteries (MCA): Normal. VENOUS SINUSES: As permitted by contrast timing, patent. ANATOMIC VARIANTS: None Review of the MIP images confirms the above findings. IMPRESSION: 1. No intracranial arterial occlusion or high-grade stenosis. 2. Bilateral carotid bifurcation atherosclerosis without hemodynamically significant stenosis by NASCET criteria. 3. Small left pleural effusion. Electronically Signed   By: Deatra Robinson M.D.   On: 07/20/2020 23:38   MR BRAIN WO CONTRAST  Result Date: 07/21/2020 CLINICAL DATA:  Slurred speech and ruling EXAM: MRI HEAD WITHOUT CONTRAST TECHNIQUE: Multiplanar, multiecho pulse sequences of the brain and surrounding structures were obtained without intravenous contrast. COMPARISON:  None. FINDINGS: Brain: There are areas of abnormal diffusion restriction in the right pons and at multiple locations in the bilateral frontal and parietal white matter. The largest of these lesions is in the paramedian superior left frontal lobe. No acute or chronic hemorrhage. Normal white matter signal, parenchymal volume and CSF spaces. The midline structures are normal. Vascular: Major flow voids are preserved. Skull and upper cervical spine: Normal calvarium and skull base. Visualized upper cervical spine and soft tissues are normal. Sinuses/Orbits:No paranasal sinus fluid levels or advanced mucosal thickening. No mastoid or middle ear effusion. Normal orbits. IMPRESSION: Acute right pontine infarct in addition to multiple small acute to early subacute infarcts within the bilateral frontal and parietal white matter, consistent with Karem Tomaso central (cardiac or aortic) embolic source. No hemorrhage or mass effect. Electronically Signed   By: Deatra Robinson M.D.   On: 07/21/2020 00:01   CARDIAC CATHETERIZATION  Result Date: 07/02/2020  1st Mrg lesion is 75% stenosed.  Mid  Cx lesion is 80% stenosed.  Prox RCA lesion is 80% stenosed.  Mid LAD lesion is 50% stenosed.  Dist LAD lesion is 75% stenosed.  RPDA-1 lesion is 75% stenosed.  RPDA-2 lesion is 50% stenosed.  RPAV lesion is 50% stenosed.  Prox LAD to Mid LAD lesion is 75% stenosed.  The left ventricular systolic function is normal.  LV end diastolic pressure is normal.  The left ventricular ejection fraction is 55-65% by visual estimate.  There is no aortic valve stenosis.  Diffuse three vessel disease, particularly throughout the LAD.  Plan for cardiac surgery consult.  Distal small vessel disease may affect quality of targets.  I stressed the importance of risk factor modification with the patient including lipid lowering therapy and BP control. Results discussed with his wife, Leonard Donovan.   DG Chest Port 1 View  Result Date: 07/11/2020 CLINICAL DATA:  55 year old male postoperative day 3 status post CABG. EXAM: PORTABLE CHEST 1 VIEW COMPARISON:  Portable chest 07/10/2020 and earlier. FINDINGS: Portable AP semi upright view at 0616 hours. Right IJ introducer sheath remains. Low but improved lung volumes from yesterday. Left chest tube removed. No pneumothorax. No pulmonary edema. Patchy bilateral lung base opacity mildly improved. Stable cardiac size and mediastinal contours. Sequelae of CABG. Negative visible bowel gas. Stable visualized osseous structures. IMPRESSION: 1. Left chest tube removed. No  pneumothorax. 2. Low but improved lung volumes from yesterday. Bilateral atelectasis with probable small pleural effusions. Electronically Signed   By: Odessa Fleming M.D.   On: 07/11/2020 08:55   DG Chest Port 1 View  Result Date: 07/10/2020 CLINICAL DATA:  Chest pain EXAM: PORTABLE CHEST 1 VIEW COMPARISON:  July 09, 2020 FINDINGS: Swan-Ganz catheter has been removed. Cordis tip is in the superior vena cava. There is an apparent kink in the Cordis catheter at the cervicothoracic junction level. Chest tube on the left is  unchanged in position. No pneumothorax. There is atelectatic change in the lung bases, somewhat more on the left than the right. Lungs elsewhere clear. There is cardiomegaly with pulmonary vascularity normal. No adenopathy. No bone lesions. IMPRESSION: Tube and catheter positions as described without pneumothorax. Apparent kink in Cordis catheter at the right cervicothoracic junction level. There is bibasilar atelectasis. No new opacity evident. Stable cardiac prominence. Electronically Signed   By: Bretta Bang III M.D.   On: 07/10/2020 07:54   DG Chest Port 1 View  Result Date: 07/09/2020 CLINICAL DATA:  Chest pain.  Status postextubation EXAM: PORTABLE CHEST 1 VIEW COMPARISON:  July 08, 2020 FINDINGS: Endotracheal tube and nasogastric tube have been removed. Swan-Ganz catheter tip is in the right main pulmonary artery. There is Fran Neiswonger chest tube on the left. Temporary pacemaker wires are attached to the right heart. No pneumothorax. There is atelectatic change in the left lower lobe with equivocal left pleural effusion. There is mild atelectasis in the right base. Heart is mildly enlarged with pulmonary vascularity normal, stable. Patient is status post coronary artery bypass grafting. No adenopathy. No bone lesions. IMPRESSION: Tube and catheter positions as described without pneumothorax. Bibasilar atelectasis, more notable on the left than on the right. Small left pleural effusion. Stable cardiac prominence. Electronically Signed   By: Bretta Bang III M.D.   On: 07/09/2020 07:59   DG Chest Port 1 View  Result Date: 07/08/2020 CLINICAL DATA:  Pneumothorax EXAM: PORTABLE CHEST 1 VIEW COMPARISON:  Chest radiograph from one day prior. FINDINGS: Endotracheal tube tip is 3.6 cm above the carina. Enteric tube terminates in the gastric fundus. Intact sternotomy wires. Right internal jugular central venous catheter terminates in the right pulmonary artery. Left chest tube in place. Stable  cardiomediastinal silhouette with top-normal heart size. No pneumothorax. No pleural effusion. Low lung volumes. No overt pulmonary edema. Mild bibasilar atelectasis. IMPRESSION: 1. No pneumothorax.  Well-positioned support structures. 2. Low lung volumes with mild bibasilar atelectasis. Electronically Signed   By: Delbert Phenix M.D.   On: 07/08/2020 15:15   ECHOCARDIOGRAM COMPLETE  Result Date: 07/21/2020    ECHOCARDIOGRAM REPORT   Patient Name:   Leonard Donovan Date of Exam: 07/21/2020 Medical Rec #:  [id number]      Height:       69.0 in Accession #:    [id number]     Weight:       214.3 lb Date of Birth:  12-23-1965     BSA:          2.127 m Patient Age:    54 years       BP:           128/86 mmHg Patient Gender: M              HR:           56 bpm. Exam Location:  Inpatient Procedure: 2D Echo, Cardiac Doppler and Color Doppler Indications:  Stroke 434.91 / I163.9  History:        Patient has prior history of Echocardiogram examinations, most                 recent 07/08/2020. Risk Factors:Hypertension.  Sonographer:    Elmarie Shiley Dance Referring Phys: [id number] DAVID MANUEL ORTIZ IMPRESSIONS  1. Left ventricular ejection fraction, by estimation, is 60 to 65%. The left ventricle has normal function. The left ventricle has no regional wall motion abnormalities. There is mild concentric left ventricular hypertrophy. Left ventricular diastolic parameters are indeterminate.  2. Right ventricular systolic function is low normal. The right ventricular size is normal.  3. Left atrial size was mildly dilated.  4. The mitral valve is normal in structure. Trivial mitral valve regurgitation.  5. The aortic valve is tricuspid. Aortic valve regurgitation is not visualized. No aortic stenosis is present.  6. The inferior vena cava is normal in size with greater than 50% respiratory variability, suggesting right atrial pressure of 3 mmHg. Comparison(s): No significant change from prior study. Conclusion(s)/Recommendation(s): No  intracardiac source of embolism detected on this transthoracic study. Mirabella Hilario transesophageal echocardiogram is recommended to exclude cardiac source of embolism if clinically indicated. FINDINGS  Left Ventricle: Left ventricular ejection fraction, by estimation, is 60 to 65%. The left ventricle has normal function. The left ventricle has no regional wall motion abnormalities. The left ventricular internal cavity size was normal in size. There is  mild concentric left ventricular hypertrophy. Abnormal (paradoxical) septal motion consistent with post-operative status. Left ventricular diastolic parameters are indeterminate. Right Ventricle: The right ventricular size is normal. Right vetricular wall thickness was not well visualized. Right ventricular systolic function is low normal. Left Atrium: Left atrial size was mildly dilated. Right Atrium: Right atrial size was normal in size. Pericardium: Trivial pericardial effusion is present. Mitral Valve: The mitral valve is normal in structure. Trivial mitral valve regurgitation. Tricuspid Valve: The tricuspid valve is normal in structure. Tricuspid valve regurgitation is trivial. No evidence of tricuspid stenosis. Aortic Valve: The aortic valve is tricuspid. Aortic valve regurgitation is not visualized. No aortic stenosis is present. Pulmonic Valve: The pulmonic valve was not well visualized. Pulmonic valve regurgitation is trivial. Aorta: The aortic root, ascending aorta and aortic arch are all structurally normal, with no evidence of dilitation or obstruction. Venous: The inferior vena cava is normal in size with greater than 50% respiratory variability, suggesting right atrial pressure of 3 mmHg. IAS/Shunts: The atrial septum is grossly normal.  LEFT VENTRICLE PLAX 2D LVIDd:         3.90 cm  Diastology LVIDs:         3.30 cm  LV e' medial:    7.29 cm/s LV PW:         1.70 cm  LV E/e' medial:  13.5 LV IVS:        1.40 cm  LV e' lateral:   10.40 cm/s LVOT diam:     2.10 cm   LV E/e' lateral: 9.5 LV SV:         71 LV SV Index:   33 LVOT Area:     3.46 cm  RIGHT VENTRICLE             IVC RV Basal diam:  2.80 cm     IVC diam: 1.80 cm RV S prime:     11.20 cm/s TAPSE (M-mode): 1.6 cm LEFT ATRIUM             Index  RIGHT ATRIUM           Index LA diam:        4.60 cm 2.16 cm/m  RA Area:     11.70 cm LA Vol (A2C):   56.7 ml 26.65 ml/m RA Volume:   20.60 ml  9.68 ml/m LA Vol (A4C):   59.8 ml 28.11 ml/m LA Biplane Vol: 58.5 ml 27.50 ml/m  AORTIC VALVE LVOT Vmax:   103.00 cm/s LVOT Vmean:  68.800 cm/s LVOT VTI:    0.205 m  AORTA Ao Root diam: 3.40 cm Ao Asc diam:  3.30 cm MITRAL VALVE MV Area (PHT): 3.27 cm    SHUNTS MV Decel Time: 232 msec    Systemic VTI:  0.20 m MV E velocity: 98.50 cm/s  Systemic Diam: 2.10 cm MV Vitalia Stough velocity: 74.20 cm/s MV E/Asaad Gulley ratio:  1.33 Jodelle Red MD Electronically signed by Jodelle Red MD Signature Date/Time: 07/21/2020/5:11:14 PM    Final    ECHOCARDIOGRAM COMPLETE  Result Date: 07/02/2020    ECHOCARDIOGRAM REPORT   Patient Name:   MAYCOL HOYING Date of Exam: 07/02/2020 Medical Rec #:  [id number]      Height:       69.0 in Accession #:    [id number]     Weight:       218.1 lb Date of Birth:  1966-03-03     BSA:          2.143 m Patient Age:    54 years       BP:           189/108 mmHg Patient Gender: M              HR:           75 bpm. Exam Location:  Inpatient Procedure: 2D Echo, Cardiac Doppler and Color Doppler Indications:    NSTEMI I21.4  History:        Patient has no prior history of Echocardiogram examinations.                 Risk Factors:Hypertension.  Sonographer:    Eulah Pont RDCS Referring Phys: [id number] CALLIE E GOODRICH IMPRESSIONS  1. Left ventricular ejection fraction, by estimation, is 60 to 65%. The left ventricle has normal function. The left ventricle has no regional wall motion abnormalities. Left ventricular diastolic parameters are consistent with Grade II diastolic dysfunction (pseudonormalization).  Elevated left atrial pressure.  2. Right ventricular systolic function is normal. The right ventricular size is normal.  3. The mitral valve is grossly normal. Mild mitral valve regurgitation.  4. The aortic valve is normal in structure. Aortic valve regurgitation is not visualized. FINDINGS  Left Ventricle: Left ventricular ejection fraction, by estimation, is 60 to 65%. The left ventricle has normal function. The left ventricle has no regional wall motion abnormalities. The left ventricular internal cavity size was normal in size. There is  no left ventricular hypertrophy. Left ventricular diastolic parameters are consistent with Grade II diastolic dysfunction (pseudonormalization). Elevated left atrial pressure. Right Ventricle: The right ventricular size is normal. Right vetricular wall thickness was not assessed. Right ventricular systolic function is normal. Left Atrium: Left atrial size was normal in size. Right Atrium: Right atrial size was normal in size. Pericardium: There is no evidence of pericardial effusion. Mitral Valve: The mitral valve is grossly normal. Mild mitral valve regurgitation. Tricuspid Valve: The tricuspid valve is normal in structure. Tricuspid valve regurgitation is mild. Aortic Valve: The aortic valve is normal  in structure. Aortic valve regurgitation is not visualized. Pulmonic Valve: The pulmonic valve was normal in structure. Pulmonic valve regurgitation is not visualized. Aorta: The aortic root and ascending aorta are structurally normal, with no evidence of dilitation. IAS/Shunts: The interatrial septum was not assessed.  LEFT VENTRICLE PLAX 2D LVIDd:         4.70 cm  Diastology LVIDs:         3.10 cm  LV e' medial:    4.35 cm/s LV PW:         1.00 cm  LV E/e' medial:  20.2 LV IVS:        1.10 cm  LV e' lateral:   3.26 cm/s LVOT diam:     2.10 cm  LV E/e' lateral: 26.9 LV SV:         85 LV SV Index:   40 LVOT Area:     3.46 cm  RIGHT VENTRICLE RV S prime:     18.50 cm/s TAPSE  (M-mode): 2.1 cm LEFT ATRIUM             Index       RIGHT ATRIUM           Index LA diam:        3.50 cm 1.63 cm/m  RA Area:     10.20 cm LA Vol (A2C):   52.7 ml 24.59 ml/m RA Volume:   19.70 ml  9.19 ml/m LA Vol (A4C):   51.1 ml 23.84 ml/m LA Biplane Vol: 54.6 ml 25.47 ml/m  AORTIC VALVE LVOT Vmax:   123.00 cm/s LVOT Vmean:  89.800 cm/s LVOT VTI:    0.245 m  AORTA Ao Root diam: 3.20 cm Ao Asc diam:  3.20 cm MITRAL VALVE MV Area (PHT): 2.66 cm     SHUNTS MV Decel Time: 285 msec     Systemic VTI:  0.24 m MV E velocity: 87.80 cm/s   Systemic Diam: 2.10 cm MV Jackalynn Art velocity: 119.00 cm/s MV E/Krystina Strieter ratio:  0.74 Dietrich Pates MD Electronically signed by Dietrich Pates MD Signature Date/Time: 07/02/2020/8:02:07 PM    Final    ECHO INTRAOPERATIVE TEE  Result Date: 07/08/2020  *INTRAOPERATIVE TRANSESOPHAGEAL REPORT *  Patient Name:   NIKOLAI WILCZAK Date of Exam: 07/08/2020 Medical Rec #:  [id number]      Height:       69.0 in Accession #:    [id number]     Weight:       209.8 lb Date of Birth:  1966-01-06     BSA:          2.11 m Patient Age:    54 years       BP:           150/83 mmHg Patient Gender: M              HR:           51 bpm. Exam Location:  Anesthesiology Transesophogeal exam was perform intraoperatively during surgical procedure. Patient was closely monitored under general anesthesia during the entirety of examination. Indications:     CAD Native Vessel i25.10 Sonographer:     Irving Burton Senior RDCS Performing Phys: [id number] Gwenith Daily QIONGEXB Diagnosing Phys: Kipp Brood MD Complications: No known complications during this procedure. PRE-OP FINDINGS  Left Ventricle: Marland Kitchen There was normal LV systolic function with the ejection fraction estimated at 55-60% using the 3DQ volumetric package. There were no regional wall motion abnormalities. There was moderate concentric left ventricular  hypertrophy with the LV wall thickness 1.15-1.20 cm. On the post-bypass exam, the LV systolic function was normal and unchanged from the pre-bypass  exam. Right Ventricle: The right ventricle has normal systolic function. The cavity was normal. There is no increase in right ventricular wall thickness. On the post-bypass exam, the RV systolic function was normal and unchanged from the pre-bypass study. Left Atrium: Left atrial size was dilated. The left atrial appendage is well visualized and there is evidence of thrombus present. Left atrial appendage velocity is normal at greater than 40 cm/s. Right Atrium: Right atrial size was dilated. Interatrial Septum: No atrial level shunt detected by color flow Doppler. Pericardium: There is no evidence of pericardial effusion. Mitral Valve: The mitral valve is normal in structure. Mild thickening of the mitral valve leaflet. Mitral valve regurgitation is trivial by color flow Doppler. There is No evidence of mitral stenosis. Tricuspid Valve: The tricuspid valve was normal in structure. Tricuspid valve regurgitation is trivial by color flow Doppler. Aortic Valve: The aortic valve is tricuspid Aortic valve regurgitation was not visualized by color flow Doppler. There is no evidence of aortic valve stenosis. There is no evidence of Taiwo Fish vegetation on the aortic valve. Pulmonic Valve: The pulmonic valve was normal in structure, with normal. No evidence of pumonic stenosis. Pulmonic valve regurgitation is trivial by color flow Doppler. Aorta: The ascending aorta was normal in diameter with Lior Cartelli well defined aortic root and sinotubular junction without dilataion or effacement. The aortic root at the sinuses of valsalva was 2.98 cm The STJ was 2.91 cm. The proximal ascending aorta was 2.93 cm. There was normal wall thickness of the ascending aorta. The descending aorta was normal in diameter and measured . There was grade 3-4 atherosclerotic plaque within the descending aorta was 2.03 cm in diameter. +--------------+-------++ LEFT VENTRICLE        +--------------+-------++ PLAX 2D               +------------+---------++  +--------------+-------++ 3D Volume EF          LVIDd:        4.80 cm +------------+---------++ +--------------+-------++ LV 3D EDV:  126.87 ml LVIDs:        3.70 cm +------------+---------++ +--------------+-------++ LV 3D ESV:  50.92 ml  LV PW:        1.60 cm +------------+---------++ +--------------+-------++ LV IVS:       0.80 cm +--------------+-------++ LV SV:        49 ml   +--------------+-------++ LV SV Index:  22.66   +--------------+-------++                       +--------------+-------++ +--------------+----------++ MITRAL VALVE             +--------------+----------++ MV Area (PHT):2.96 cm   +--------------+----------++ MV PHT:       74.24 msec +--------------+----------++ MV Decel Time:256 msec   +--------------+----------++ +--------------+----------++ MV E velocity:78.90 cm/s +--------------+----------++ MV Guadalupe Kerekes velocity:46.70 cm/s +--------------+----------++ MV E/Merek Niu ratio: 1.69       +--------------+----------++  Kipp Brood MD Electronically signed by Kipp Brood MD Signature Date/Time: 07/08/2020/6:45:51 PM    Final    VAS US DOPPLER PRE CABG  Result Date: 07/03/2020 PREOPERATIVE VASCULAR EVALUATION  Indications:      Pre-CABG. Risk Factors:     Hypertension, past history of smoking. Comparison Study: No previous Performing Technologist: Clint Guy RVT  Examination Guidelines: Jary Louvier complete evaluation includes B-mode imaging, spectral Doppler, color Doppler, and power Doppler as needed  of all accessible portions of each vessel. Bilateral testing is considered an integral part of Faizan Geraci complete examination. Limited examinations for reoccurring indications may be performed as noted.  Right Carotid Findings: +----------+--------+--------+--------+-----------+----------------------------+           PSV cm/sEDV cm/sStenosisDescribe   Comments                      +----------+--------+--------+--------+-----------+----------------------------+ CCA Prox  90      16                                                      +----------+--------+--------+--------+-----------+----------------------------+ CCA Distal99      26                                                      +----------+--------+--------+--------+-----------+----------------------------+ ICA Prox  125     45      40-59%  homogeneousplaque appears to be                                                      aprroximately 50%            +----------+--------+--------+--------+-----------+----------------------------+ ICA Distal77      25                                                      +----------+--------+--------+--------+-----------+----------------------------+ ECA       91      15                                                      +----------+--------+--------+--------+-----------+----------------------------+ Portions of this table do not appear on this page. +----------+--------+-------+--------+------------+           PSV cm/sEDV cmsDescribeArm Pressure +----------+--------+-------+--------+------------+ Subclavian199                                 +----------+--------+-------+--------+------------+ +---------+--------+--+--------+--+ VertebralPSV cm/s39EDV cm/s14 +---------+--------+--+--------+--+ Left Carotid Findings: +----------+--------+--------+--------+------------+--------+           PSV cm/sEDV cm/sStenosisDescribe    Comments +----------+--------+--------+--------+------------+--------+ CCA Prox  118     22                                   +----------+--------+--------+--------+------------+--------+ CCA Distal129     35                                   +----------+--------+--------+--------+------------+--------+ ICA Prox  77      28  1-39%   heterogenous          +----------+--------+--------+--------+------------+--------+ ICA Distal86      36                                   +----------+--------+--------+--------+------------+--------+ ECA       129     19                                   +----------+--------+--------+--------+------------+--------+ +----------+--------+--------+--------+------------+ SubclavianPSV cm/sEDV cm/sDescribeArm Pressure +----------+--------+--------+--------+------------+           147                                  +----------+--------+--------+--------+------------+ +---------+--------+--+--------+--+ VertebralPSV cm/s41EDV cm/s14 +---------+--------+--+--------+--+  ABI Findings: +--------+------------------+-----+---------+--------+ Right   Rt Pressure (mmHg)IndexWaveform Comment  +--------+------------------+-----+---------+--------+ Brachial                       triphasic85 cm/s  +--------+------------------+-----+---------+--------+ PTA                            triphasic91cm/s   +--------+------------------+-----+---------+--------+ DP                             triphasic78cm/s   +--------+------------------+-----+---------+--------+ +--------+------------------+-----+---------+-------+ Left    Lt Pressure (mmHg)IndexWaveform Comment +--------+------------------+-----+---------+-------+ Brachial                       triphasic94 cm/s +--------+------------------+-----+---------+-------+ PTA                            triphasic100cm/s +--------+------------------+-----+---------+-------+ DP                             triphasic77cm/s  +--------+------------------+-----+---------+-------+  Right Doppler Findings: +-----------+--------+-----+---------+--------+ Site       PressureIndexDoppler  Comments +-----------+--------+-----+---------+--------+ Brachial                triphasic85 cm/s  +-----------+--------+-----+---------+--------+  Radial                  triphasic54 cm/2  +-----------+--------+-----+---------+--------+ Ulnar                   triphasic30 cm/s  +-----------+--------+-----+---------+--------+ Palmar Arch             triphasic         +-----------+--------+-----+---------+--------+  Left Doppler Findings: +-----------+--------+-----+---------+--------+ Site       PressureIndexDoppler  Comments +-----------+--------+-----+---------+--------+ Brachial                triphasic94 cm/s  +-----------+--------+-----+---------+--------+ Radial                  triphasic74 cm/s  +-----------+--------+-----+---------+--------+ Ulnar                   triphasic64 cm/s  +-----------+--------+-----+---------+--------+ Palmar Arch             triphasic         +-----------+--------+-----+---------+--------+  Summary: Right Carotid: Velocities 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 Left Carotid: Velocities in the left ICA are consistent with Dezirae Service 1-39% stenosis. Vertebrals:  Bilateral vertebral arteries demonstrate antegrade flow. Subclavians: Normal flow hemodynamics were seen in bilateral subclavian              arteries. Right ABI: Normal triphasic waveforms. Left ABI: Normal Triphasic waveforms. Right Upper Extremity: Doppler waveforms remain within normal limits with right radial compression. Doppler waveform obliterate with right ulnar compression. Left Upper Extremity: Doppler waveform obliterate with left radial compression. Doppler waveform obliterate with left ulnar compression.  Electronically signed by Sherald Hess MD on 07/03/2020 at 4:26:40 PM.    Final     Microbiology: No results found for this or any previous visit (from the past 240 hour(s)).   Labs: Basic Metabolic Panel: Recent Labs  Lab 07/20/20 0755 07/20/20 1945 07/20/20 1949 07/22/20 0350  NA 140 137 138 140  K 5.1 4.7 4.6 4.3  CL 102 102 102 103  CO2 23 26  --  26  GLUCOSE  113* 105* 102* 95  BUN 24 27* 30* 20  CREATININE 1.22 1.38* 1.40* 1.23  CALCIUM 9.2 9.2  --  8.9   Liver Function Tests: Recent Labs  Lab 07/20/20 1945  AST 20  ALT 17  ALKPHOS 90  BILITOT 0.9  PROT 7.0  ALBUMIN 3.3*   No results for input(s): LIPASE, AMYLASE in the last 168 hours. No results for input(s): AMMONIA in the last 168 hours. CBC: Recent Labs  Lab 07/20/20 1945 07/20/20 1949 07/22/20 0350  WBC 10.0  --  7.2  NEUTROABS 6.9  --   --   HGB 12.1* 12.6* 11.5*  HCT 38.3* 37.0* 33.8*  MCV 90.1  --  87.1  PLT 363  --  285   Cardiac Enzymes: No results for input(s): CKTOTAL, CKMB, CKMBINDEX, TROPONINI in the last 168 hours. BNP: BNP (last 3 results) No results for input(s): BNP in the last 8760 hours.  ProBNP (last 3 results) No results for input(s): PROBNP in the last 8760 hours.  CBG: No results for input(s): GLUCAP in the last 168 hours.     Signed:  Lacretia Nicks MD.  [facility] 07/22/2020, 9:23 PM

## 2020-07-22 NOTE — Evaluation (Addendum)
Speech Language Pathology Evaluation Patient Details Name: Leonard Donovan MRN: [id number] DOB: 1966-01-27 Today's Date: 07/22/2020 Time: 9622-2979 SLP Time Calculation (min) (ACUTE ONLY): 32 min  Problem List:  Patient Active Problem List   Diagnosis Date Noted  . CVA (cerebral vascular accident) (HCC) 07/21/2020  . Acute CVA (cerebrovascular accident) (HCC) 07/20/2020  . Glaucoma   . Hypertension   . S/P CABG x 4   . Coronary artery disease 07/08/2020  . NSTEMI (non-ST elevated myocardial infarction) (HCC) 07/02/2020  . Hypertensive emergency 07/01/2020   Past Medical History:  Past Medical History:  Diagnosis Date  . Glaucoma   . Hypertension    Past Surgical History:  Past Surgical History:  Procedure Laterality Date  . CORONARY ARTERY BYPASS GRAFT N/A 07/08/2020   Procedure: CORONARY ARTERY BYPASS GRAFTING (CABG), ON PUMP, TIMES FOUR, LEFT INTERNAL MAMMARY ARTERY TO LAD, RIGHT SVG TO PDA, DISTAL CIRCUMFLEX, AND OM1;  Surgeon: Delight Ovens, MD;  Location: [facility] OR;  Service: Open Heart Surgery;  Laterality: N/A;  . ENDOVEIN HARVEST OF GREATER SAPHENOUS VEIN Right 07/08/2020   Procedure: ENDOVEIN HARVEST OF RIGHT GREATER SAPHENOUS VEIN;  Surgeon: Delight Ovens, MD;  Location: [facility] OR;  Service: Open Heart Surgery;  Laterality: Right;  . LEFT HEART CATH AND CORONARY ANGIOGRAPHY N/A 07/02/2020   Procedure: LEFT HEART CATH AND CORONARY ANGIOGRAPHY;  Surgeon: Corky Crafts, MD;  Location: [facility] INVASIVE CV LAB;  Service: Cardiovascular;  Laterality: N/A;  . NO PAST SURGERIES    . TEE WITHOUT CARDIOVERSION N/A 07/08/2020   Procedure: TRANSESOPHAGEAL ECHOCARDIOGRAM (TEE);  Surgeon: Delight Ovens, MD;  Location: [facility] OR;  Service: Open Heart Surgery;  Laterality: N/A;   HPI:  [name] is a 55 y.o. male with history of hypertension, coronary artery disease (s/p quadruple CABG on 07/08/2020), glaucoma, COVID-19 infection (sx on 06/07/2020, dx on 06/18/2020), presenting with  left sided facial droop, slurred speech, and left upper extremity weakness.  He was found to have an acute right pontine infarct and multiple small acute/subacute infarcts within the bilateral frontal and parietal white matter. SLE requested as part of stroke protocol.   Assessment / Plan / Recommendation Clinical Impression  Pt presents with moderate cognitive deficits characterized by deficits in attention and memory which negatively impact executive functioning skills (planning, self monitoring, and organization) and mild dysarthria characterized by imprecise articulation resulting in >90% intelligibility at the conversation level. Pt scored a 19/30 on the [facility] with point reductions in mental calculation, 3/5 for 5 item recall, digit repetition in reverse, clock drawing, and paragraph comprehension. Pt reports poor sleep, however his wife indicates that Pt would not have any difficulty with these areas previously. She indicates that he is typically very organized and plans/executes complex tasks at home/word. Currently, Pt's primary deficit appears to be reduced attention and not stopping and thinking before completing tasks. For example, he missed several months when saying the months in sequential order from January to December, but demonstrated improved performance when reciting in reverse when he was forced to slow down and think. While these deficits may be a result of poor sleep, it may also be attributed to his acute stroke. Recommend outpatient SLP follow up post discharge to assess once he has returned home and back in his normal environment. Pt and wife are in agreement with plan of care.   Addendum (10:49 AM): Dr. Lowell Guitar asked SLP to discuss possible pill dysphagia with Pt/family as wife reported some difficulty swallowing  pills since his surgery. SLP went back to room and Pt performed 3 oz water challenge (passed) and discussed his current symptoms. Pt/spouse indicate globus sensation and  some "choking" when taking pills since his heart surgery. Vocal quality is clear/unchanged and cough is strong. SLP encouraged Pt to take smalls sips of liquids at this time and consider taking large pills, whole in yogurt and follow with sips water for now. Pt may be experiencing some changes/edema from recent intubation for his surgery. They are also encouraged to monitor and notify MD post d/c if concerns arise. Pt and spouse agreeable to plan of care.     SLP Assessment  SLP Recommendation/Assessment: All further Speech Lanaguage Pathology  needs can be addressed in the next venue of care SLP Visit Diagnosis: Attention and concentration deficit;Frontal lobe and executive function deficit;Dysarthria and anarthria (R47.1) Attention and concentration deficit following: Cerebral infarction Frontal lobe and executive function deficit following: Cerebral infarction    Follow Up Recommendations  Outpatient SLP    Frequency and Duration  (Pt likely discharging soon)         SLP Evaluation Cognition  Overall Cognitive Status: Impaired/Different from baseline Arousal/Alertness: Awake/alert Orientation Level: Oriented X4 Attention: Sustained Sustained Attention: Impaired Sustained Attention Impairment: Verbal basic Memory: Impaired Memory Impairment: Storage deficit;Retrieval deficit Awareness: Impaired Awareness Impairment: Emergent impairment Problem Solving: Impaired Problem Solving Impairment: Functional complex Executive Function: Self Monitoring;Organizing (planning) Organizing: Impaired Organizing Impairment: Verbal complex Self Monitoring: Impaired Self Monitoring Impairment: Functional complex Behaviors: Impulsive Safety/Judgment: Appears intact       Comprehension  Auditory Comprehension Overall Auditory Comprehension: Impaired Yes/No Questions: Within Functional Limits Commands: Impaired Complex Commands: 75-100% accurate Conversation: Complex Interfering Components:  Attention;Processing speed;Working Radio broadcast assistant: Dietitian: Within [employer] Reading Comprehension Reading Status: Not tested    Expression Expression Primary Mode of Expression: Verbal Verbal Expression Overall Verbal Expression: Appears within functional limits for tasks assessed Initiation: No impairment Automatic Speech: Name;Social Response;Month of year Level of Generative/Spontaneous Verbalization: Conversation Repetition: No impairment Naming: No impairment Pragmatics: No impairment Interfering Components: Attention Non-Verbal Means of Communication: Not applicable Written Expression Dominant Hand: Right Written Expression: Not tested   Oral / Motor  Oral Motor/Sensory Function Overall Oral Motor/Sensory Function: Mild impairment Facial ROM: Reduced left Facial Symmetry: Abnormal symmetry left;Suspected CN VII (facial) dysfunction Facial Strength: Reduced left;Suspected CN VII (facial) dysfunction Facial Sensation: Within Functional Limits Lingual ROM: Within Functional Limits Lingual Symmetry: Within Functional Limits Lingual Strength: Within Functional Limits Lingual Sensation: Within Functional Limits Velum: Within Functional Limits Mandible: Within Functional Limits Motor Speech Overall Motor Speech: Impaired Respiration: Within functional limits Phonation: Normal Resonance: Within functional limits Articulation: Impaired Level of Impairment: Sentence Intelligibility: Intelligibility reduced Word: 75-100% accurate Phrase: 75-100% accurate Sentence: 75-100% accurate Conversation: 75-100% accurate Motor Planning: Witnin functional limits Motor Speech Errors: Unaware Effective Techniques: Over-articulate;Slow rate   Thank you,  Havery Moros, CCC-SLP [phone]                     PORTER,DABNEY 07/22/2020, 9:59 AM

## 2020-07-22 NOTE — TOC Transition Note (Signed)
Transition of Care [facility]) - CM/SW Discharge Note   Patient Details  Name: PAXSON HARROWER MRN: [id number] Date of Birth: January 11, 1966  Transition of Care [facility]) CM/SW Contact:  Kermit Balo, RN Phone Number: 07/22/2020, 1:45 PM   Clinical Narrative:    Pt discharging home with outpatient therapy through [facility] Orders faxed to [facility]: [phone] Pt has transport home.   Final next level of care: OP Rehab Barriers to Discharge: No Barriers Identified   Patient Goals and CMS Choice   CMS Medicare.gov Compare Post Acute Care list provided to:: Patient Choice offered to / list presented to : [facility]  Discharge Placement                       Discharge Plan and Services                                     Social Determinants of Health (SDOH) Interventions     Readmission Risk Interventions No flowsheet data found.

## 2020-07-22 NOTE — TOC Initial Note (Signed)
Transition of Care [facility]) - Initial/Assessment Note    Patient Details  Name: Leonard Donovan MRN: [id number] Date of Birth: 05/11/1966  Transition of [facility]) CM/SW Contact:    Kermit Balo, RN Phone Number: 07/22/2020, 12:03 PM  Clinical Narrative:                 Pt s/p recent CABG. He is from home with spouse. No DME at the home or needed per therapies. Wife and patient deny transportation or medication needs.  Pt has PCP appt in March with Dr Mayford Knife at [facility] ([facility]). TOC following.  Expected Discharge Plan: Home/Self Care Barriers to Discharge: Continued Medical Work up   Patient Goals and CMS Choice        Expected Discharge Plan and Services Expected Discharge Plan: Home/Self Care       Living arrangements for the past 2 months: Single Family Home                                      Prior Living Arrangements/Services Living arrangements for the past 2 months: Single Family Home Lives with:: Spouse Patient language and need for interpreter reviewed:: Yes Do you feel safe going back to the place where you live?: Yes      Need for Family Participation in Patient Care: Yes (Comment) Care giver support system in place?: Yes (comment)   Criminal Activity/Legal Involvement Pertinent to Current Situation/Hospitalization: No - Comment as needed  Activities of Daily Living Home Assistive Devices/Equipment: None ADL Screening (condition at time of admission) Patient's cognitive ability adequate to safely complete daily activities?: Yes Is the patient deaf or have difficulty hearing?: No Does the patient have difficulty seeing, even when wearing glasses/contacts?: No Does the patient have difficulty concentrating, remembering, or making decisions?: No Patient able to express need for assistance with ADLs?: No Does the patient have difficulty dressing or bathing?: No Independently performs ADLs?: No Communication: Independent Dressing  (OT): Independent Grooming: Independent Feeding: Independent Bathing: Independent Toileting: Independent In/Out Bed: Independent Walks in Home: Independent Does the patient have difficulty walking or climbing stairs?: No Weakness of Legs: None Weakness of Arms/Hands: None  Permission Sought/Granted                  Emotional Assessment Appearance:: Appears stated age Attitude/Demeanor/Rapport: Engaged Affect (typically observed): Accepting Orientation: : Oriented to Self,Oriented to Place,Oriented to  Time,Oriented to Situation   Psych Involvement: No (comment)  Admission diagnosis:  Acute CVA (cerebrovascular accident) (HCC) [I63.9] CVA (cerebral vascular accident) [facility]) [I63.9] Patient Active Problem List   Diagnosis Date Noted  . CVA (cerebral vascular accident) (HCC) 07/21/2020  . Acute CVA (cerebrovascular accident) (HCC) 07/20/2020  . Glaucoma   . Hypertension   . S/P CABG x 4   . Coronary artery disease 07/08/2020  . NSTEMI (non-ST elevated myocardial infarction) (HCC) 07/02/2020  . Hypertensive emergency 07/01/2020   PCP:  No primary care provider on file. Pharmacy:   [id number] - [place], [place] - [address] [place] [address] Phone: [phone] Fax: [phone]     Social Determinants of Health (SDOH) Interventions    Readmission Risk Interventions No flowsheet data found.

## 2020-07-24 ENCOUNTER — Telehealth: Payer: Self-pay | Admitting: Cardiology

## 2020-07-24 NOTE — Telephone Encounter (Signed)
Patient's wife is calling regarding patient's 30-day monitor. She states the monitor arrived today, 07/24/20.  However, she states he may not be able to apply it because he is 2 weeks post-Bypass procedure and the monitor would have to be applied at the site of his incision. She would like to know if the patient needs to wait to apply his monitor.

## 2020-07-24 NOTE — Telephone Encounter (Signed)
Spoke with pt wife, aware will have the monitor techs reach out to her next week to help with placement.

## 2020-07-29 ENCOUNTER — Other Ambulatory Visit: Payer: Self-pay

## 2020-07-29 ENCOUNTER — Encounter: Payer: Self-pay | Admitting: Physician Assistant

## 2020-07-29 ENCOUNTER — Ambulatory Visit: Payer: BC Managed Care – PPO | Admitting: Physician Assistant

## 2020-07-29 ENCOUNTER — Ambulatory Visit (INDEPENDENT_AMBULATORY_CARE_PROVIDER_SITE_OTHER): Payer: BC Managed Care – PPO

## 2020-07-29 VITALS — BP 136/86 | HR 65 | Ht 68.0 in | Wt 212.6 lb

## 2020-07-29 DIAGNOSIS — I25810 Atherosclerosis of coronary artery bypass graft(s) without angina pectoris: Secondary | ICD-10-CM

## 2020-07-29 DIAGNOSIS — R55 Syncope and collapse: Secondary | ICD-10-CM

## 2020-07-29 DIAGNOSIS — Z79899 Other long term (current) drug therapy: Secondary | ICD-10-CM | POA: Diagnosis not present

## 2020-07-29 DIAGNOSIS — I639 Cerebral infarction, unspecified: Secondary | ICD-10-CM | POA: Diagnosis not present

## 2020-07-29 DIAGNOSIS — I4891 Unspecified atrial fibrillation: Secondary | ICD-10-CM

## 2020-07-29 DIAGNOSIS — Z951 Presence of aortocoronary bypass graft: Secondary | ICD-10-CM | POA: Diagnosis not present

## 2020-07-29 DIAGNOSIS — Z8673 Personal history of transient ischemic attack (TIA), and cerebral infarction without residual deficits: Secondary | ICD-10-CM

## 2020-07-29 DIAGNOSIS — I951 Orthostatic hypotension: Secondary | ICD-10-CM | POA: Diagnosis not present

## 2020-07-29 LAB — CBC
Hematocrit: 38.5 % (ref 37.5–51.0)
Hemoglobin: 13 g/dL (ref 13.0–17.7)
MCH: 29 pg (ref 26.6–33.0)
MCHC: 33.8 g/dL (ref 31.5–35.7)
MCV: 86 fL (ref 79–97)
Platelets: 258 10*3/uL (ref 150–450)
RBC: 4.48 x10E6/uL (ref 4.14–5.80)
RDW: 13.4 % (ref 11.6–15.4)
WBC: 6.4 10*3/uL (ref 3.4–10.8)

## 2020-07-29 LAB — BASIC METABOLIC PANEL
BUN/Creatinine Ratio: 20 (ref 9–20)
BUN: 21 mg/dL (ref 6–24)
CO2: 24 mmol/L (ref 20–29)
Calcium: 9.2 mg/dL (ref 8.7–10.2)
Chloride: 102 mmol/L (ref 96–106)
Creatinine, Ser: 1.07 mg/dL (ref 0.76–1.27)
Glucose: 103 mg/dL — ABNORMAL HIGH (ref 65–99)
Potassium: 4.5 mmol/L (ref 3.5–5.2)
Sodium: 139 mmol/L (ref 134–144)
eGFR: 83 mL/min/{1.73_m2} (ref >59)

## 2020-07-29 LAB — TSH: TSH: 6.02 u[IU]/mL — ABNORMAL HIGH (ref 0.450–4.500)

## 2020-07-29 NOTE — Progress Notes (Signed)
Cardiology Office Note:    Date:  07/29/2020   ID:  Leonard Donovan, DOB 1966/04/05, MRN [id number]  PCP:  Patient, No Pcp Per   [facility]  Cardiologist:  Olga MillersBrian Crenshaw, MD  Advanced Practice Provider:  No care team member to display Electrophysiologist:  None   Referring MD: No ref. provider found   Chief Complaint  Patient presents with  . Hospitalization Follow-up    Seen for Dr. Jens Somrenshaw, also seen by DOD Dr. SwazilandJordan during today's visit    History of Present Illness:    Leonard Donovan is a 55 y.o. male with a hx of hypertension, glaucoma, Covid infection in January 2022, prediabetes, recent diagnosed CAD s/p CABG and CVA.  Patient presented to the hospital in February 2022 with diffuse chest pain for 2 weeks and ruled in for NSTEMI.  Cardiac catheterization performed on 2/30/2022 revealed 75% proximal LAD, 80% mid left circumflex lesion, 75% OM1 lesion, 80% proximal RCA lesion, 75% PDA lesion.  Echocardiogram showed EF 60 to 65%, mild MR.  Patient was subsequently referred to cardiothoracic surgery service and underwent successful CABG x4 (Lima-LAD, SVG-PD, distal LCx and OM1) by Dr. Nydia BoutonGearhart on 07/08/2020.  Preoperative carotid duplex showed no significant left carotid artery disease, 40 to 59% right carotid artery disease.  Creatinine was initially elevated, but returned to normal prior to discharge.  Unfortunately after his discharge from the hospital, he returned back to the hospital on 07/08/2020 due to slurred speech, left upper extremity weakness and was diagnosed with stroke based on MRI finding.  MRI demonstrated multiple small acute or subacute early infarct within the bilateral frontal and parietal white matter consistent with central embolic source.  Neurology was consulted and recommended to continue on aspirin and Plavix along with Lipitor.  Neurology service also recommended a 30-day event monitor as well.  Repeat echocardiogram showed EF 60 to 65%, no  intracardiac source of embolism detected.  Patient presents today for follow-up.  He does complain of chest soreness at the sternotomy site.  He still have mild ipsilateral weakness especially in the lower extremity.  Otherwise he has been compliant with dual antiplatelet therapy.  On further questioning, he mentions he had 2 episodes of near syncope and at least one episode of full syncope since his recent discharge.  He says all 3 episodes occurred after he took a hot shower.  He says he had a flushing sensation and a feeling of passing out.  During the most recent episode, he did pass out.  He did not go to the hospital for this events.  Also he did receive the heart monitor but have not started wearing the heart monitor at this time.  Near the end of our interview, he started complaining of the same flushing sensation he has experienced prior to the previous passing out spell.  Subsequently, he became very diaphoretic and he feels like he is going to have the same passing out spell.  I immediately lay him down on the exam table and called a CODE BLUE.  His systolic blood pressure dipped down to the 80s and heart rate went down to the 40s.  EKG still shows sinus bradycardia.  Patient's case was discussed with DOD Dr. SwazilandJordan who arrived on the scene.  He was completely laid flat and his legs elevated.  His blood pressure and heart rate slowly improved back to normal.  Subsequent orthostatic vital sign was positive.  We hydrated the patient and orthostatic vital  signs improved afterward.  I discussed his case with DOD Dr. [place], at this point we will discontinue his carvedilol and losartan, therefore allow his blood pressure to drift higher.  He has been instructed to aggressively hydrate himself.  I will obtain TSH, CBC and a basic metabolic panel to rule out potential secondary causes.  Recent pericardium did not mention any sizable pericardial effusion.  I plan to bring the patient back in 1 week for  reassessment.  Past Medical History:  Diagnosis Date  . CAD (coronary artery disease) of bypass graft    CABG x 4  . Glaucoma   . H/O: CVA (cerebrovascular accident) 2022  . Hyperlipidemia LDL goal <70   . Hypertension     Past Surgical History:  Procedure Laterality Date  . CORONARY ARTERY BYPASS GRAFT N/A 07/08/2020   Procedure: CORONARY ARTERY BYPASS GRAFTING (CABG), ON PUMP, TIMES FOUR, LEFT INTERNAL MAMMARY ARTERY TO LAD, RIGHT SVG TO PDA, DISTAL CIRCUMFLEX, AND OM1;  Surgeon: Delight Ovens, MD;  Location: [facility] OR;  Service: Open Heart Surgery;  Laterality: N/A;  . ENDOVEIN HARVEST OF GREATER SAPHENOUS VEIN Right 07/08/2020   Procedure: ENDOVEIN HARVEST OF RIGHT GREATER SAPHENOUS VEIN;  Surgeon: Delight Ovens, MD;  Location: [facility] OR;  Service: Open Heart Surgery;  Laterality: Right;  . LEFT HEART CATH AND CORONARY ANGIOGRAPHY N/A 07/02/2020   Procedure: LEFT HEART CATH AND CORONARY ANGIOGRAPHY;  Surgeon: Corky Crafts, MD;  Location: [facility] INVASIVE CV LAB;  Service: Cardiovascular;  Laterality: N/A;  . NO PAST SURGERIES    . TEE WITHOUT CARDIOVERSION N/A 07/08/2020   Procedure: TRANSESOPHAGEAL ECHOCARDIOGRAM (TEE);  Surgeon: Delight Ovens, MD;  Location: [facility] OR;  Service: Open Heart Surgery;  Laterality: N/A;    Current Medications: Current Meds  Medication Sig  . acetaminophen (TYLENOL) 500 MG tablet Take 2 tablets (1,000 mg total) by mouth every 8 (eight) hours as needed for mild pain (or discomfort).  Marland Kitchen aspirin EC 81 MG EC tablet Take 1 tablet (81 mg total) by mouth daily. Swallow whole.  Marland Kitchen atorvastatin (LIPITOR) 80 MG tablet Take 1 tablet (80 mg total) by mouth daily.  . carvedilol (COREG) 6.25 MG tablet Take 1 tablet (6.25 mg total) by mouth 2 (two) times daily with a meal.  . clopidogrel (PLAVIX) 75 MG tablet Take 1 tablet (75 mg total) by mouth daily.  Marland Kitchen losartan (COZAAR) 25 MG tablet Take 1 tablet (25 mg total) by mouth every evening.  . Multiple Vitamins-Minerals  (MULTIVITAMIN WITH MINERALS) tablet Take 1 tablet by mouth daily.     Allergies:   Patient has no known allergies.   Social History   Socioeconomic History  . Marital status: Married    Spouse name: Not on file  . Number of children: Not on file  . Years of education: Not on file  . Highest education level: Not on file  Occupational History  . Not on file  Tobacco Use  . Smoking status: Former Games developer  . Smokeless tobacco: Former Clinical biochemist  . Vaping Use: Never used  Substance and Sexual Activity  . Alcohol use: Not Currently  . Drug use: Not Currently  . Sexual activity: Not on file  Other Topics Concern  . Not on file  Social History Narrative  . Not on file   Social Determinants of Health   Financial Resource Strain: Not on file  Food Insecurity: Not on file  Transportation Needs: Not on file  Physical Activity:  Not on file  Stress: Not on file  Social Connections: Not on file     Family History: The patient's family history includes CAD in his sister; Heart attack in his sister; Hypertension in his father.  ROS:   Please see the history of present illness.     All other systems reviewed and are negative.  EKGs/Labs/Other Studies Reviewed:    The following studies were reviewed today:  Echo 07/21/2020 1. Left ventricular ejection fraction, by estimation, is 60 to 65%. The  left ventricle has normal function. The left ventricle has no regional  wall motion abnormalities. There is mild concentric left ventricular  hypertrophy. Left ventricular diastolic  parameters are indeterminate.  2. Right ventricular systolic function is low normal. The right  ventricular size is normal.  3. Left atrial size was mildly dilated.  4. The mitral valve is normal in structure. Trivial mitral valve  regurgitation.  5. The aortic valve is tricuspid. Aortic valve regurgitation is not  visualized. No aortic stenosis is present.  6. The inferior vena cava is normal  in size with greater than 50%  respiratory variability, suggesting right atrial pressure of 3 mmHg.   EKG:  EKG is ordered today.  The ekg ordered today demonstrates normal sinus rhythm without significant ST-T wave changes  Recent Labs: 07/09/2020: Magnesium 2.5 07/20/2020: ALT 17 07/29/2020: BUN 21; Creatinine, Ser 1.07; Hemoglobin 13.0; Platelets 258; Potassium 4.5; Sodium 139; TSH 6.020  Recent Lipid Panel    Component Value Date/Time   CHOL 103 07/21/2020 0500   TRIG 148 07/21/2020 0500   HDL 24 (L) 07/21/2020 0500   CHOLHDL 4.3 07/21/2020 0500   VLDL 30 07/21/2020 0500   LDLCALC 49 07/21/2020 0500     Risk Assessment/Calculations:       Physical Exam:    VS:  BP 136/86   Pulse 65   Ht 5\' 8"  (1.727 m)   Wt 212 lb 9.6 oz (96.4 kg)   BMI 32.33 kg/m     Wt Readings from Last 3 Encounters:  07/29/20 212 lb 9.6 oz (96.4 kg)  07/21/20 204 lb 12.8 oz (92.9 kg)  07/12/20 214 lb 4.6 oz (97.2 kg)     GEN:  Well nourished, well developed in no acute distress HEENT: Normal NECK: No JVD; No carotid bruits LYMPHATICS: No lymphadenopathy CARDIAC: RRR, no murmurs, rubs, gallops.  Sternotomy scar is well-healed RESPIRATORY:  Clear to auscultation without rales, wheezing or rhonchi  ABDOMEN: Soft, non-tender, non-distended MUSCULOSKELETAL:  No edema; No deformity  SKIN: Warm and dry NEUROLOGIC:  Alert and oriented x 3 PSYCHIATRIC:  Normal affect   ASSESSMENT:    1. Orthostatic hypotension   2. Coronary artery disease involving coronary bypass graft of native heart without angina pectoris   3. S/P CABG x 4   4. Medication management   5. Vasovagal episode   6. Syncope and collapse   7. H/O: CVA (cerebrovascular accident)    PLAN:    In order of problems listed above:  1. Orthostatic hypotension: He had a severe orthostatic hypotension today and a near syncope during the visit.  He was hydrated in the office and discharged home with instruction to aggressively hydrate  himself for the next few days.  Carvedilol and losartan will be discontinued for the time being, at least until his blood pressure improved.  Obtain CBC, basic metabolic panel and a TSH to rule out secondary causes  2. CAD s/p CABG: Recently underwent bypass surgery.  Continue aspirin  and Plavix.  Sternotomy scar is well-healed  3. Vasovagal episode: Patient had a vasovagal episode during today's exam, CODE BLUE triggered.  Systolic blood pressure initially dropped down to the 80s and heart rate dropped down to the 40s.  He was diaphoretic and feel like he was going to pass out during the episode.  He was laid flat and his legs were bent upward.  Blood pressure improved within 5 minutes.  4. Syncope: He has had at least 2 episodes of near syncope and one complete syncope prior to today's visit.  During the visit, he had another near syncope where his blood pressure dipped down to the 80s.  Suspect this is vasovagal episode.  However, even despite oral fluid hydration in the office, he is still significantly orthostatic afterward.  He has been instructed to hydrate himself in the next few days.  He has also been instructed to start wearing the 30-day heart monitor.  5. History of CVA: Recent CVA after the bypass surgery.  He was discharged on a 30-day event monitor.  Unfortunately he has not started wearing the monitor even though he has received it.  After today's episode of near syncope, he has been instructed to start wearing the monitor immediately.  6. Hyperlipidemia: Continue statin therapy        Medication Adjustments/Labs and Tests Ordered: Current medicines are reviewed at length with the patient today.  Concerns regarding medicines are outlined above.  Orders Placed This Encounter  Procedures  . CBC  . Basic Metabolic Panel (BMET)  . TSH  . EKG 12-Lead   No orders of the defined types were placed in this encounter.   Patient Instructions  Medication Instructions:  STOP-  Carvedilol  STOP- Losartan  *If you need a refill on your cardiac medications before your next appointment, please call your pharmacy*   Lab Work: CBC, BMP and TSH  If you have labs (blood work) drawn today and your tests are completely normal, you will receive your results only by: Marland Kitchen MyChart Message (if you have MyChart) OR . A paper copy in the mail If you have any lab test that is abnormal or we need to change your treatment, we will call you to review the results.   Testing/Procedures: None Ordered   Follow-Up: At [facility], you and your health needs are our priority.  As part of our continuing mission to provide you with exceptional heart care, we have created designated Provider Care Teams.  These Care Teams include your primary Cardiologist (physician) and Advanced Practice Providers (APPs -  Physician Assistants and Nurse Practitioners) who all work together to provide you with the care you need, when you need it.  We recommend signing up for the patient portal called "MyChart".  Sign up information is provided on this After Visit Summary.  MyChart is used to connect with patients for Virtual Visits (Telemedicine).  Patients are able to view lab/test results, encounter notes, upcoming appointments, etc.  Non-urgent messages can be sent to your provider as well.   To learn more about what you can do with MyChart, go to [email or web address].    Your next appointment:   Thursday March 10th @ 11:15 am  The format for your next appointment:   In Person  Provider:    Corine Shelter, PA-C        Signed, [place], [place]  07/29/2020 11:02 PM    [facility]

## 2020-07-29 NOTE — Patient Instructions (Signed)
Medication Instructions:  STOP- Carvedilol  HOLD- Losartan Today RESTART Tomorrow  *If you need a refill on your cardiac medications before your next appointment, please call your pharmacy*   Lab Work: CBC, BMP and TSH  If you have labs (blood work) drawn today and your tests are completely normal, you will receive your results only by: Marland Kitchen MyChart Message (if you have MyChart) OR . A paper copy in the mail If you have any lab test that is abnormal or we need to change your treatment, we will call you to review the results.   Testing/Procedures: None Ordered   Follow-Up: At [facility], you and your health needs are our priority.  As part of our continuing mission to provide you with exceptional heart care, we have created designated Provider Care Teams.  These Care Teams include your primary Cardiologist (physician) and Advanced Practice Providers (APPs -  Physician Assistants and Nurse Practitioners) who all work together to provide you with the care you need, when you need it.  We recommend signing up for the patient portal called "MyChart".  Sign up information is provided on this After Visit Summary.  MyChart is used to connect with patients for Virtual Visits (Telemedicine).  Patients are able to view lab/test results, encounter notes, upcoming appointments, etc.  Non-urgent messages can be sent to your provider as well.   To learn more about what you can do with MyChart, go to [email or web address].    Your next appointment:   Thursday March 10th @ 11:15 am  The format for your next appointment:   In Person  Provider:    Corine Shelter, PA-C

## 2020-07-29 NOTE — Progress Notes (Signed)
Normal red blood cell count, improving renal function and normal electrolyte. TSH is high suggesting possible hypothyroidism, need to follow up with PCP for additional evaluation

## 2020-07-30 ENCOUNTER — Telehealth: Payer: Self-pay | Admitting: Physician Assistant

## 2020-07-30 NOTE — Telephone Encounter (Signed)
Spoke with patient's wife, SBP now is the 140s sitting. Will also need to check BP upon standing as well. Informed her lab result. Will need PCP workup for elevated TSH. He just established with a new PCP yesterday, I forwarded the TSH lab result to Dr. Norris Cross office.

## 2020-08-05 NOTE — Telephone Encounter (Signed)
Monitor baseline recording sent 07/29/2020.

## 2020-08-06 ENCOUNTER — Encounter: Payer: Self-pay | Admitting: Cardiology

## 2020-08-06 ENCOUNTER — Ambulatory Visit (INDEPENDENT_AMBULATORY_CARE_PROVIDER_SITE_OTHER): Payer: BC Managed Care – PPO | Admitting: Cardiology

## 2020-08-06 ENCOUNTER — Other Ambulatory Visit: Payer: Self-pay

## 2020-08-06 VITALS — BP 153/99 | HR 82 | Ht 69.0 in | Wt 217.0 lb

## 2020-08-06 DIAGNOSIS — I951 Orthostatic hypotension: Secondary | ICD-10-CM | POA: Diagnosis not present

## 2020-08-06 DIAGNOSIS — I6340 Cerebral infarction due to embolism of unspecified cerebral artery: Secondary | ICD-10-CM | POA: Diagnosis not present

## 2020-08-06 DIAGNOSIS — Z951 Presence of aortocoronary bypass graft: Secondary | ICD-10-CM | POA: Diagnosis not present

## 2020-08-06 MED ORDER — METOPROLOL SUCCINATE ER 25 MG PO TB24: 25.0000 mg | ORAL_TABLET | Freq: Every day | ORAL | 6 refills | Status: DC

## 2020-08-06 NOTE — Patient Instructions (Addendum)
Medication Instructions:  START COREG  HOLD LOSARTAN FOR NOW  START METOPROLOL 25MG  DAILY *If you need a refill on your cardiac medications before your next appointment, please call your pharmacy*  Lab Work:   Testing/Procedures:  NONE    NONE  Follow-Up: Your next appointment:  1-2 WITH HAO MENG PA-C; AND 2-3 MONTHS WITH DR CRENSHAW week(s) In Person with You may see , MD or one of the following Advanced Practice Providers on your designated Care Team:  [place], PA-C -OR-  [place], FNP   At [facility], you and your health needs are our priority.  As part of our continuing mission to provide you with exceptional heart care, we have created designated Provider Care Teams.  These Care Teams include your primary Cardiologist (physician) and Advanced Practice Providers (APPs -  Physician Assistants and Nurse Practitioners) who all work together to provide you with the care you need, when you need it.

## 2020-08-06 NOTE — Progress Notes (Signed)
Cardiology Office Note:    Date:  08/06/2020   ID:  Leonard Donovan, DOB 10/13/1965, MRN [id number]  PCP:  Leonard Hageman, DO  Cardiologist:  Leonard Millers, MD  Electrophysiologist:  None   Referring MD: No ref. provider found   No chief complaint on file. F/U from 07/29/2020 OV  History of Present Illness:    Leonard Donovan is a 55 y.o. male with a hx of NSTEMI Feb 2022.  Cardiac catheterization on 2/30/2022 revealed 75% proximal LAD, 80% mid left circumflex lesion, 75% OM1 lesion, 80% proximal RCA lesion, 75% PDA lesion.  Echocardiogram showed EF 60 to 65%, mild MR.  Patient was subsequently underwent CABG x4 (Lima-LAD, SVG-PD, distal LCx and OM1) by Dr. Nydia Donovan on 07/08/2020.  Preoperative carotid duplex showed no significant left carotid artery disease, 40 to 59% right carotid artery disease.  Creatinine was initially elevated post op, but returned to normal prior to discharge.    Unfortunately after his discharge from the hospital, he returned back to the hospital on 07/08/2020 due to slurred speech, left upper extremity weakness and was diagnosed with stroke based on MRI finding.  MRI demonstrated multiple small acute or subacute early infarct within the bilateral frontal and parietal white matter consistent with central embolic source.  Neurology was consulted and recommended to continue on aspirin and Plavix along with Lipitor.  Neurology service also recommended a 30-day event monitor as well.  Repeat echocardiogram showed EF 60 to 65%, no intracardiac source of embolism detected.  Patient was seen in the office 07/29/2020.  At that visit he related to episodes of near syncope.  At that office visit he had an episode of near syncope and was documented to be hypotensive.  His carvedilol and Losartan were stopped. Labs were normal.  Follow-up the next day reported an improvement in his blood pressure.  He returns today for orthostatic blood pressure check and follow-up.  His wife reports  that for the first few days his blood pressure remained orthostatic but now has stabilized.  Pressure in office today shows a lying blood pressure of 178/100 sitting 150/98 and standing 150/90 pulse is 70.  Standing after 3 minutes his blood pressure is 148/90.  He is eating well.  He has not had recurrent syncope or near syncope.  Past Medical History:  Diagnosis Date  . CAD (coronary artery disease) of bypass graft    CABG x 4  . Glaucoma   . H/O: CVA (cerebrovascular accident) 2022  . Hyperlipidemia LDL goal <70   . Hypertension     Past Surgical History:  Procedure Laterality Date  . CORONARY ARTERY BYPASS GRAFT N/A 07/08/2020   Procedure: CORONARY ARTERY BYPASS GRAFTING (CABG), ON PUMP, TIMES FOUR, LEFT INTERNAL MAMMARY ARTERY TO LAD, RIGHT SVG TO PDA, DISTAL CIRCUMFLEX, AND OM1;  Surgeon: Delight Ovens, MD;  Location: [facility] OR;  Service: Open Heart Surgery;  Laterality: N/A;  . ENDOVEIN HARVEST OF GREATER SAPHENOUS VEIN Right 07/08/2020   Procedure: ENDOVEIN HARVEST OF RIGHT GREATER SAPHENOUS VEIN;  Surgeon: Delight Ovens, MD;  Location: [facility] OR;  Service: Open Heart Surgery;  Laterality: Right;  . LEFT HEART CATH AND CORONARY ANGIOGRAPHY N/A 07/02/2020   Procedure: LEFT HEART CATH AND CORONARY ANGIOGRAPHY;  Surgeon: Corky Crafts, MD;  Location: [facility] INVASIVE CV LAB;  Service: Cardiovascular;  Laterality: N/A;  . NO PAST SURGERIES    . TEE WITHOUT CARDIOVERSION N/A 07/08/2020   Procedure: TRANSESOPHAGEAL ECHOCARDIOGRAM (TEE);  Surgeon: Sheliah Plane  B, MD;  Location: [facility] OR;  Service: Open Heart Surgery;  Laterality: N/A;    Current Medications: No outpatient medications have been marked as taking for the 08/06/20 encounter (Appointment) with [name], PA-C.     Allergies:   Patient has no known allergies.   Social History   Socioeconomic History  . Marital status: Married    Spouse name: Not on file  . Number of children: Not on file  . Years of education: Not on  file  . Highest education level: Not on file  Occupational History  . Not on file  Tobacco Use  . Smoking status: Former Games developer  . Smokeless tobacco: Former Clinical biochemist  . Vaping Use: Never used  Substance and Sexual Activity  . Alcohol use: Not Currently  . Drug use: Not Currently  . Sexual activity: Not on file  Other Topics Concern  . Not on file  Social History Narrative  . Not on file   Social Determinants of Health   Financial Resource Strain: Not on file  Food Insecurity: Not on file  Transportation Needs: Not on file  Physical Activity: Not on file  Stress: Not on file  Social Connections: Not on file     Family History: The patient's family history includes CAD in his sister; Heart attack in his sister; Hypertension in his father.  ROS:   Please see the history of present illness.     All other systems reviewed and are negative.  EKGs/Labs/Other Studies Reviewed:    The following studies were reviewed today: Echo 07/21/2020  EKG:  EKG is not ordered today.  The ekg ordered 07/29/2020 demonstrates NSR, HR 65  Recent Labs: 07/09/2020: Magnesium 2.5 07/20/2020: ALT 17 07/29/2020: BUN 21; Creatinine, Ser 1.07; Hemoglobin 13.0; Platelets 258; Potassium 4.5; Sodium 139; TSH 6.020  Recent Lipid Panel    Component Value Date/Time   CHOL 103 07/21/2020 0500   TRIG 148 07/21/2020 0500   HDL 24 (L) 07/21/2020 0500   CHOLHDL 4.3 07/21/2020 0500   VLDL 30 07/21/2020 0500   LDLCALC 49 07/21/2020 0500    Physical Exam:    VS:  There were no vitals taken for this visit.    Wt Readings from Last 3 Encounters:  07/29/20 212 lb 9.6 oz (96.4 kg)  07/21/20 204 lb 12.8 oz (92.9 kg)  07/12/20 214 lb 4.6 oz (97.2 kg)     GEN: Well nourished, well developed in no acute distress HEENT: Normal NECK: No JVD CARDIAC: RRR, no murmurs, rubs, gallops RESPIRATORY:  Clear to auscultation without rales, wheezing or rhonchi  ABDOMEN: Soft,  non-distended MUSCULOSKELETAL:   No edema; No deformity  SKIN: Warm and dry NEUROLOGIC:  Alert and oriented x 3-he still has slight Lt facial droop but he says this is improving PSYCHIATRIC:  Normal affect   ASSESSMENT:    1. Orthostatic hypotension   2. Coronary artery disease involving coronary bypass graft of native heart without angina pectoris   3. S/P CABG x 4   4. Medication management   5. Vasovagal episode   6. Syncope and collapse   7. H/O: CVA (cerebrovascular accident)     PLAN:    DC Coreg - start Toprol XL 25 mg QD.  Monitor home B/P. Virtual f/u in 1-2 weeks and resume Losartan 25 mg (or valsartan) if needed.  F/U Dr Jens Som in 2-3 months.    Medication Adjustments/Labs and Tests Ordered: Current medicines are reviewed at length with the  patient today.  Concerns regarding medicines are outlined above.  No orders of the defined types were placed in this encounter.  No orders of the defined types were placed in this encounter.   There are no Patient Instructions on file for this visit.   Jolene Provost, PA-C  08/06/2020 11:36 AM    [facility]

## 2020-08-07 MED ORDER — NITROGLYCERIN 0.4 MG SL SUBL: 0.4000 mg | SUBLINGUAL_TABLET | SUBLINGUAL | 3 refills | Status: DC | PRN

## 2020-08-12 ENCOUNTER — Other Ambulatory Visit: Payer: Self-pay | Admitting: Cardiothoracic Surgery

## 2020-08-12 DIAGNOSIS — Z951 Presence of aortocoronary bypass graft: Secondary | ICD-10-CM

## 2020-08-12 NOTE — Progress Notes (Signed)
[address]       [place] [address]             [phone]      Leonard Donovan [facility] Medical Record #[id number] Date of Birth: [birth year]  Referring: Corky Crafts, MD Primary Care: Montez Hageman, DO Primary Cardiologist: [name], MD   Chief Complaint:   POST OP FOLLOW UP OPERATIVE REPORT DATE OF PROCEDURE:  07/08/2020 PREOPERATIVE DIAGNOSES:  Recent non-STEMI myocardial infarction with 3-vessel coronary artery disease. POSTOPERATIVE DIAGNOSES:  Recent non-STEMI myocardial infarction with 3-vessel coronary artery disease. SURGICAL PROCEDURE:  Coronary artery bypass grafting x4 with the left internal mammary to the left anterior descending coronary artery, reverse saphenous vein graft to the first obtuse marginal, reverse saphenous vein graft to the distal circumflex,  reverse saphenous vein graft to the posterior descending with right thigh and calf endoscopic vein harvesting of the greater saphenous vein. SURGEON:  Sheliah Plane, MD  History of Present Illness:     Patient returns to the office today follow-up after coronary artery bypass grafting on February 9.  Initially presented with severe hypertension and chest and back pain to [facility] urgent care ultimately troponins were found to be positive and he underwent cardiac catheterization and subsequent coronary artery bypass grafting since discharge home he has had problems with blood pressure control at times with orthostatic changes in his blood pressure and other times with hypertension.  He was seen by cardiology all of his discharge blood pressure medicine was discontinued and he was restarted on metoprolol a week later.  He has had no recurrent chest pain, has been gaining strength and is to start in cardiac rehab next week    On July 21, 2021 due to slurred speech, left upper extremity weakness and was diagnosed with stroke based on MRI finding. MRI demonstrated multiple  small acute or subacute early infarct within the bilateral frontal and parietal white matter consistent with central embolic source. Neurology was consulted and recommended to continue on aspirin and Plavix along with Lipitor. Neurology service also recommended a 30-day event monitor as well. Repeat echocardiogram showed EF 60 to 65%, no intracardiac source of embolism detected.    Past Medical History:  Diagnosis Date  . CAD (coronary artery disease) of bypass graft    CABG x 4  . Glaucoma   . H/O: CVA (cerebrovascular accident) 2022  . Hyperlipidemia LDL goal <70   . Hypertension      Social History   Tobacco Use  Smoking Status Former Smoker  Smokeless Tobacco Former Neurosurgeon    Social History   Substance and Sexual Activity  Alcohol Use Not Currently     No Known Allergies  Current Outpatient Medications  Medication Sig Dispense Refill  . acetaminophen (TYLENOL) 500 MG tablet Take 2 tablets (1,000 mg total) by mouth every 8 (eight) hours as needed for mild pain (or discomfort). 30 tablet 0  . aspirin EC 81 MG EC tablet Take 1 tablet (81 mg total) by mouth daily. Swallow whole. 30 tablet 11  . atorvastatin (LIPITOR) 80 MG tablet Take 1 tablet (80 mg total) by mouth daily. 30 tablet 1  . clopidogrel (PLAVIX) 75 MG tablet Take 1 tablet (75 mg total) by mouth daily. 30 tablet 1  . metoprolol succinate (TOPROL XL) 25 MG 24 hr tablet Take 1 tablet (25 mg total) by mouth daily. 30 tablet 6  . Multiple Vitamins-Minerals (MULTIVITAMIN WITH MINERALS) tablet Take 1  tablet by mouth daily.    . nitroGLYCERIN (NITROSTAT) 0.4 MG SL tablet Place 1 tablet (0.4 mg total) under the tongue every 5 (five) minutes as needed. 25 tablet 3  . losartan (COZAAR) 25 MG tablet Take 1 tablet (25 mg total) by mouth every evening. (Patient not taking: Reported on 08/13/2020) 30 tablet 1   No current facility-administered medications for this visit.       Physical Exam: BP (!) 146/86 (BP Location:  Left Arm, Patient Position: Sitting)   Pulse 70   Resp 20   Ht 5\' 9"  (1.753 m)   Wt 218 lb (98.9 kg)   SpO2 97% Comment: RA  BMI 32.19 kg/m   General appearance: alert, cooperative and no distress Neurologic: intact Heart: regular rate and rhythm, S1, S2 normal, no murmur, click, rub or gallop Lungs: clear to auscultation bilaterally Abdomen: soft, non-tender; bowel sounds normal; no masses,  no organomegaly Extremities: extremities normal, atraumatic, no cyanosis or edema Wound: Sternum is stable and well-healed as is his right leg harvest site Neurologically the patient's speech is without difficulty, he has equal strength in his upper and lower extremities, he has a very slight facial droop at the left corner of his mouth only  Diagnostic Studies & Laboratory data:     Recent Radiology Findings:   DG Chest 2 View  Result Date: 08/13/2020 CLINICAL DATA:  Status post coronary artery bypass grafting. Hypertension. EXAM: CHEST - 2 VIEW COMPARISON:  July 12, 2020 FINDINGS: Lungs are clear. Heart is upper normal in size with pulmonary vascularity normal. No adenopathy. Patient is status post coronary artery bypass grafting. Apparent loop recorder on the left. There is mild degenerative change in the thoracic spine. IMPRESSION: Lungs clear.  Heart upper normal in size.  Postoperative changes. Electronically Signed   By: July 14, 2020 III M.D.   On: 08/13/2020 09:20    I have independently reviewed the above radiology studies  and reviewed the findings with the patient.   Recent Lab Findings: Lab Results  Component Value Date   WBC 6.4 07/29/2020   HGB 13.0 07/29/2020   HCT 38.5 07/29/2020   PLT 258 07/29/2020   GLUCOSE 103 (H) 07/29/2020   CHOL 103 07/21/2020   TRIG 148 07/21/2020   HDL 24 (L) 07/21/2020   LDLCALC 49 07/21/2020   ALT 17 07/20/2020   AST 20 07/20/2020   NA 139 07/29/2020   K 4.5 07/29/2020   CL 102 07/29/2020   CREATININE 1.07 07/29/2020   BUN 21  07/29/2020   CO2 24 07/29/2020   TSH 6.020 (H) 07/29/2020   INR 1.1 07/20/2020   HGBA1C 5.6 07/21/2020      Assessment / Plan:   #1 presentation with non-STEMI myocardial infarction malignant hypertension and found to have significant coronary artery disease-now status post coronary artery bypass grafting #2 difficult to control hypertension-prior to his surgery and since his bypass surgery his blood pressure has been difficult to control and through cardiology has been on several different medications schemes-he continues to have close follow-up for blood pressure control #3 mild stroke symptoms after discharged home on aspirin and Plavix following coronary artery bypass grafting-symptoms have been mild speech rehab has discharged him from their care #4 mild elevation of TSH noted during his hospitalization-now has primary care physician and will follow up with this with primary care  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     Delight Ovens MD      [address] [place] [place] [address] Office [phone]     08/13/2020 9:37 AM

## 2020-08-13 ENCOUNTER — Encounter: Payer: Self-pay | Admitting: Cardiothoracic Surgery

## 2020-08-13 ENCOUNTER — Ambulatory Visit
Admission: RE | Admit: 2020-08-13 | Discharge: 2020-08-13 | Disposition: A | Discharge status: Home / Self Care | Payer: BC Managed Care – PPO | Source: Ambulatory Visit | Attending: Cardiothoracic Surgery | Admitting: Cardiothoracic Surgery

## 2020-08-13 ENCOUNTER — Ambulatory Visit (INDEPENDENT_AMBULATORY_CARE_PROVIDER_SITE_OTHER): Payer: Self-pay | Admitting: Cardiothoracic Surgery

## 2020-08-13 ENCOUNTER — Other Ambulatory Visit: Payer: Self-pay

## 2020-08-13 VITALS — BP 146/86 | HR 70 | Resp 20 | Ht 69.0 in | Wt 218.0 lb

## 2020-08-13 DIAGNOSIS — Z951 Presence of aortocoronary bypass graft: Secondary | ICD-10-CM

## 2020-08-19 ENCOUNTER — Telehealth: Payer: Self-pay | Admitting: Cardiology

## 2020-08-19 NOTE — Telephone Encounter (Signed)
Wife called stating pt presented to cardiac rehab today for first initial visit and was unable to start due to BP. She report pt BP today was 212/139, 218/112 in R arm and 182/112 in L arm. Once home wife rechecked and BP was 175/109 sitting and 184/116 standing. Pt report he is asymptomatic but for the past two week BP has been running around 170/100's.    Will route to [employer]. PA for recommendations

## 2020-08-19 NOTE — Telephone Encounter (Signed)
Pt c/o BP issue: STAT if pt c/o blurred vision, one-sided weakness or slurred speech  1. What are your last 5 BP readings?  212/139, 218/112 both in R Arm 182/112 in L Arm   2. Are you having any other symptoms (ex. Dizziness, headache, blurred vision, passed out)? no  3. What is your BP issue? Patient went to cardiac rehab and was not allowed to continue with rehab because his BP was so high. Wife called to let the office know about the BP readings. She wasn't sure if he needed to be seen sooner or not. Please advise

## 2020-08-20 MED ORDER — METOPROLOL SUCCINATE ER 50 MG PO TB24: 50.0000 mg | ORAL_TABLET | Freq: Every day | ORAL | 3 refills | Status: DC

## 2020-08-20 NOTE — Addendum Note (Signed)
Addended by: Parke Poisson on: 08/20/2020 12:17 PM   Modules accepted: Orders

## 2020-08-20 NOTE — Telephone Encounter (Signed)
Recommend increased Toprol-XL to 50 mg daily.  Please verify with the patient if his losartan is still on hold. (I previously however did as the patient had a severe hypotensive episode in the office)   If after increasing Toprol-XL, home systolic blood pressure still elevated above 150 mmHg on multiple blood pressure check, then he will need to restart losartan 25 mg if he has been holding it, or increase losartan to 50 mg daily if he has been taking the losartan recently.

## 2020-08-20 NOTE — Telephone Encounter (Signed)
Wife updated with recommendations and verbalized understanding. She report pt has not been taking losartan but will start with increasing Metoprolol to 50 mg, will try BP for a couple, then call to provide readings prior to restarting losartan.

## 2020-08-24 ENCOUNTER — Other Ambulatory Visit: Payer: Self-pay

## 2020-08-24 ENCOUNTER — Ambulatory Visit: Payer: BC Managed Care – PPO | Admitting: Adult Health

## 2020-08-24 ENCOUNTER — Encounter: Payer: Self-pay | Admitting: Adult Health

## 2020-08-24 VITALS — BP 186/112 | HR 74 | Ht 69.0 in | Wt 219.0 lb

## 2020-08-24 DIAGNOSIS — R1310 Dysphagia, unspecified: Secondary | ICD-10-CM

## 2020-08-24 DIAGNOSIS — I639 Cerebral infarction, unspecified: Secondary | ICD-10-CM | POA: Diagnosis not present

## 2020-08-24 DIAGNOSIS — I69354 Hemiplegia and hemiparesis following cerebral infarction affecting left non-dominant side: Secondary | ICD-10-CM | POA: Diagnosis not present

## 2020-08-24 DIAGNOSIS — I69319 Unspecified symptoms and signs involving cognitive functions following cerebral infarction: Secondary | ICD-10-CM

## 2020-08-24 NOTE — Patient Instructions (Signed)
Referral placed to neuro rehab for speech and occupational therapy - if you do not hear from them by Wednesday or Thursday, please call to schedule initial evaluation.  You will also be called to schedule barium swallow study to further evaluate swallowing concerns  Continue aspirin 81 mg daily and clopidogrel 75 mg daily  and atorvastatin for secondary stroke prevention  Complete 30-day cardiac event monitor which is evaluated for possible underlying abnormal heart rhythm that could have contributed to your recent stroke  Continue to follow up with PCP/cardiology regarding cholesterol and blood pressure management  Maintain strict control of hypertension with blood pressure goal below 130/90 and cholesterol with LDL cholesterol (bad cholesterol) goal below 70 mg/dL.       Followup in the future with me in 3 months or call earlier if needed       Thank you for coming to see [place] at [facility]. I hope we have been able to provide you high quality care today.  You may receive a patient satisfaction survey over the next few weeks. We would appreciate your feedback and comments so that we may continue to improve ourselves and the health of our patients.

## 2020-08-24 NOTE — Progress Notes (Unsigned)
[facility] [address] [place] [id number] [phone]       HOSPITAL FOLLOW UP NOTE  [name] Date of Birth:  [birth year] Medical Record Number:  [id number]   Reason for Referral:  hospital stroke follow up    SUBJECTIVE:   CHIEF COMPLAINT:  Chief Complaint  Patient presents with  . Follow-up    Rm 14 with wife (carla) Pt is L sided weakness/pain, and drooling     HPI:   Mr. Leonard Junglinglton L Inboden is a 55 y.o. male with history of hypertension, coronary artery disease (s/p quadruple CABG on 07/08/2020), glaucoma, COVID-19 infection (sx on 06/07/2020, dx on1/20/2022),  who presented on 07/20/2020 with left sided facial droop, slurred speech, and left upper extremity weakness.   Personally reviewed hospitalization pertinent progress notes, lab work and imaging with summary provided.  Evaluated by Dr. Roda ShuttersXu with stroke work-up revealing multifocal infarcts, largest at right pontine, likely related to recent cardiac surgery however other cardioembolic source cannot be ruled out.  Recommended 30-day cardiac event monitor outpatient to rule out A. fib.  On DAPT PTA and recommended continuation at discharge per cardiology recommendations s/p CABG 07/08/2020.  LDL 49 on atorvastatin 80 mg daily.  Other stroke risk factors include former tobacco use, obesity and suspected OSA.  Residual deficit of mild left facial droop, left hemiparesis and moderate cognitive deficits.  Stroke:  Multifocal infarcts, largest at right pontine, likely related to recent cardiac surgery. However, other cardioembolic source can not rule out  Code Stroke CT head: Small focus of hypodensity in the right white matter.  CTA head & neck: no intracranial arterial occlusion or high-grade stenosis. Bilateral carotid bifurcation atherosclerosis, right more than left  MRI Acute right pontine infarct in addition to multiple small acute to early subacute infarcts within the bilateral frontal and  parietal white matter, consistent with a central (cardiac or aortic) embolic source. No hemorrhage or mass effect.  Recommend 30 day monitor to evaluate for atrial fibrillation as the source for stroke  2D Echo: EF 60 to 65%  LDL 49  HgbA1c 5.6  VTE prophylaxis - Lovenox 40mg  daily  Swallow eval: passed for heart healthy diet  aspirin 81 mg daily and Plavix prior to admission, continue aspirin 81 mg daily and clopidogrel 75 mg daily DAPT on discharge.  Therapy recommendations:  outpt SLP  Disposition: home   Today, 08/24/2020, Leonard Donovan is being seen for hospital follow-up accompanied by his wife  Reports residual left-sided weakness, left facial droop, swallowing difficulties and cognitive difficulties Evaluated by [place] SLP 3/7 - noted mild cognitive linguistic deficits; no concerns of aspiration -personally reviewed evaluation note -no further SLP recommended as patient and wife reported that he was at baseline At todays visit, wife reports occasional confusion and delayed processing which patient agrees with Report of swallowing difficulties only with water otherwise denies difficulty Denies new stroke/TIA symptoms 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 Has cards f/u on Wednesday Currently wearing cardiac monitor which will be completed on 4/3  No further concerns at this time       ROS:  14 system review of systems performed and negative with exception of those listed in HPI  PMH:  Past Medical History:  Diagnosis Date  . CAD (coronary artery  disease) of bypass graft    CABG x 4  . Glaucoma   . H/O: CVA (cerebrovascular accident) 2022  . Hyperlipidemia LDL goal <70   . Hypertension     PSH:  Past Surgical History:  Procedure Laterality Date  . CORONARY ARTERY BYPASS GRAFT N/A 07/08/2020   Procedure: CORONARY ARTERY BYPASS GRAFTING (CABG), ON PUMP, TIMES FOUR, LEFT INTERNAL MAMMARY ARTERY TO LAD, RIGHT SVG TO PDA, DISTAL CIRCUMFLEX, AND OM1;  Surgeon: Delight Ovens, MD;  Location: [facility] OR;  Service: Open Heart Surgery;  Laterality: N/A;  . ENDOVEIN HARVEST OF GREATER SAPHENOUS VEIN Right 07/08/2020   Procedure: ENDOVEIN HARVEST OF RIGHT GREATER SAPHENOUS VEIN;  Surgeon: Delight Ovens, MD;  Location: [facility] OR;  Service: Open Heart Surgery;  Laterality: Right;  . LEFT HEART CATH AND CORONARY ANGIOGRAPHY N/A 07/02/2020   Procedure: LEFT HEART CATH AND CORONARY ANGIOGRAPHY;  Surgeon: Corky Crafts, MD;  Location: [facility] INVASIVE CV LAB;  Service: Cardiovascular;  Laterality: N/A;  . NO PAST SURGERIES    . TEE WITHOUT CARDIOVERSION N/A 07/08/2020   Procedure: TRANSESOPHAGEAL ECHOCARDIOGRAM (TEE);  Surgeon: Delight Ovens, MD;  Location: [facility] OR;  Service: Open Heart Surgery;  Laterality: N/A;    Social History:  Social History   Socioeconomic History  . Marital status: Married    Spouse name: Not on file  . Number of children: Not on file  . Years of education: Not on file  . Highest education level: Not on file  Occupational History  . Not on file  Tobacco Use  . Smoking status: Former Games developer  . Smokeless tobacco: Former Clinical biochemist  . Vaping Use: Never used  Substance and Sexual Activity  . Alcohol use: Not Currently  . Drug use: Not Currently  . Sexual activity: Not on file  Other Topics Concern  . Not on file  Social History Narrative  . Not on file   Social Determinants of Health   Financial Resource Strain: Not on file  Food Insecurity: Not on file  Transportation Needs: Not on file  Physical  Activity: Not on file  Stress: Not on file  Social Connections: Not on file  Intimate Partner Violence: Not on file    Family History:  Family History  Problem Relation Age of Onset  . Hypertension Father   . CAD Sister        diagnosed in her 9s  . Heart attack Sister     Medications:   Current Outpatient Medications on File Prior to Visit  Medication Sig Dispense Refill  . acetaminophen (TYLENOL) 500 MG tablet Take 2 tablets (1,000 mg total) by mouth every 8 (eight) hours as needed for mild pain (or discomfort). 30 tablet 0  . aspirin EC 81 MG EC tablet Take 1 tablet (81 mg total) by mouth daily. Swallow whole. 30 tablet 11  . atorvastatin (LIPITOR) 80 MG tablet Take 1 tablet (80 mg total) by mouth daily. 30 tablet 1  . clopidogrel (PLAVIX) 75 MG tablet Take 1 tablet (75 mg total) by mouth daily. 30 tablet 1  . losartan (COZAAR) 25 MG tablet Take 1 tablet (25 mg total) by mouth every evening. 30 tablet 1  . metoprolol succinate (TOPROL XL) 50 MG 24 hr tablet Take 1 tablet (50 mg total) by mouth  daily. 90 tablet 3  . Multiple Vitamins-Minerals (MULTIVITAMIN WITH MINERALS) tablet Take 1 tablet by mouth daily.    . nitroGLYCERIN (NITROSTAT) 0.4 MG SL tablet Place 1 tablet (0.4 mg total) under the tongue every 5 (five) minutes as needed. 25 tablet 3   No current facility-administered medications on file prior to visit.    Allergies:  No Known Allergies    OBJECTIVE:  Physical Exam  Vitals:   08/24/20 1405  BP: (!) 186/112  Pulse: 74  Weight: 219 lb (99.3 kg)  Height: 5\' 9"  (1.753 m)   Body mass index is 32.34 kg/m. No exam data present  General: well developed, well nourished,  pleasant middle-age Caucasian male, seated, in no evident distress Head: head normocephalic and atraumatic.   Neck: supple with no carotid or supraclavicular bruits Cardiovascular: regular rate and rhythm, no murmurs Musculoskeletal: no deformity Skin:  no rash/petichiae Vascular:  Normal  pulses all extremities   Neurologic Exam Mental Status: Awake and fully alert.   Fluent speech and language.  Oriented to place and time. Recent and remote memory intact. Attention span, concentration and fund of knowledge appropriate during visit with wife providing some history. Mood and affect appropriate.  Recall: 3/3. 4 legged animal naming 12 in 60 seconds. Serial addition good.  Clock drawing 4/4 Cranial Nerves: Fundoscopic exam reveals sharp disc margins. Pupils equal, briskly reactive to light. Extraocular movements full without nystagmus. Visual fields full to confrontation. Hearing intact. Facial sensation intact.  Left lower facial weakness.  Tongue and palate moves normally and symmetrically.  Motor: Normal bulk and tone and strength right upper and lower extremity LUE: 5/5 deltoid and elbow extension; 4/5 elbow flexion handgrip.  Decreased finger dexterity LLE: 5/5 Sensory.: intact to touch , pinprick , position and vibratory sensation.  Coordination: Rapid alternating movements normal in all extremities except slightly decreased left hand. Finger-to-nose and heel-to-shin performed accurately bilaterally.  Orbits right arm over left arm. Gait and Station: Arises from chair without difficulty. Stance is normal. Gait demonstrates normal stride length and balance without use of assistive device. Tandem walk and heel toe without difficulty.  Reflexes: 1+ and symmetric. Toes downgoing.     NIHSS  1 Modified Rankin  2-3      ASSESSMENT: ARISH REDNER is a 55 y.o. year old male presented with left facial droop, slurred speech and LUE weakness on 07/20/2020 with stroke work-up revealing multifocal infarcts, largest at right pontine, likely related to recent cardiac surgery (s/p quadruple CABG 2/9) although other cardioembolic sources cannot be ruled out.  Vascular risk factors include HTN, HLD, CAD s/p CABG 06/2020, COVID-19 infection 05/2020, former tobacco use and suspected OSA.       PLAN:  1. Multifocal infarcts:  a. Residual deficit: mild LUE weakness with decreased hand dexterity, cognitive impairment (high level executive functioning), left facial weakness and subjective dysphagia.  Referral placed to neuro rehab OT/SLP as well as MBS order to further evaluate dysphagia complaints. i. Currently on FMLA from cardiac perspective not cleared to return until mid May (3 mo post procedure). If extension is needed from a stroke standpoint, we can further assist with this  b. Complete 30-day cardiac event monitor to rule out atrial fibrillation c. Continue aspirin 81 mg daily and clopidogrel 75 mg daily  and atorvastatin for secondary stroke prevention.  Use of DAPT duration will be decided by cardiology post CABG as only monotherapy indicated from neurological standpoint d. Discussed secondary stroke prevention measures and importance of close  PCP follow up for aggressive stroke risk factor management  2. HTN: BP goal <130/90.  Currently uncontrolled likely in setting of BP med adjustments after epiodes of orthostatic syncope. F/u with cardiology this week for further recommendations 3. HLD: LDL goal <70. Recent LDL 49 on atorvastatin 80 mg daily per cardiology.  4. Suspected OSA: Unable to further discuss during recent visit due to time constraints. Will discuss further evaluation at follow-up visit 5. CAD s/p CABG: Followed closely by cardiology    Follow up in 3 months or call earlier if needed   CC:  [facility] provider: Dr. Pearlean Brownie PCP: Montez Hageman, DO    I spent 45 minutes of face-to-face and non-face-to-face time with patient and wife.  This included previsit chart review including recent hospitalization pertinent progress notes, lab work and imaging, lab review, study review, order entry, electronic health record documentation, patient and wife education and discussion regarding recent stroke including etiology, residual deficits, importance of managing stroke  risk factors and answered all other questions to patient and wife's satisfaction  Ihor Austin, AGNP-BC  [facility] [address] [place], [place] [address]  Phone [phone] Fax [phone] Note: This document was prepared with digital dictation and possible smart phrase technology. Any transcriptional errors that result from this process are unintentional.

## 2020-08-25 ENCOUNTER — Encounter: Payer: Self-pay | Admitting: Adult Health

## 2020-08-25 NOTE — Progress Notes (Signed)
I agree with the above plan 

## 2020-08-26 ENCOUNTER — Other Ambulatory Visit: Payer: Self-pay

## 2020-08-26 ENCOUNTER — Other Ambulatory Visit: Payer: Self-pay | Admitting: Physician Assistant

## 2020-08-26 ENCOUNTER — Encounter: Payer: Self-pay | Admitting: Physician Assistant

## 2020-08-26 ENCOUNTER — Other Ambulatory Visit: Payer: Self-pay | Admitting: Cardiology

## 2020-08-26 ENCOUNTER — Ambulatory Visit (INDEPENDENT_AMBULATORY_CARE_PROVIDER_SITE_OTHER): Payer: BC Managed Care – PPO | Admitting: Physician Assistant

## 2020-08-26 VITALS — BP 160/104 | HR 76 | Ht 69.0 in | Wt 220.8 lb

## 2020-08-26 DIAGNOSIS — I25810 Atherosclerosis of coronary artery bypass graft(s) without angina pectoris: Secondary | ICD-10-CM | POA: Diagnosis not present

## 2020-08-26 DIAGNOSIS — R7303 Prediabetes: Secondary | ICD-10-CM

## 2020-08-26 DIAGNOSIS — Z8673 Personal history of transient ischemic attack (TIA), and cerebral infarction without residual deficits: Secondary | ICD-10-CM | POA: Diagnosis not present

## 2020-08-26 DIAGNOSIS — Z79899 Other long term (current) drug therapy: Secondary | ICD-10-CM | POA: Diagnosis not present

## 2020-08-26 DIAGNOSIS — I10 Essential (primary) hypertension: Secondary | ICD-10-CM | POA: Diagnosis not present

## 2020-08-26 MED ORDER — LOSARTAN POTASSIUM 50 MG PO TABS: 50.0000 mg | ORAL_TABLET | Freq: Every evening | ORAL | 6 refills | Status: DC

## 2020-08-26 NOTE — Progress Notes (Signed)
Cardiology Office Note:    Date:  08/27/2020   ID:  Leonard Donovan, DOB 30-Sep-1965, MRN [id number]  PCP:  Janeece Riggers   [facility]  Cardiologist:  [name], MD  Advanced Practice Provider:  No care team member to display Electrophysiologist:  None }   Referring MD: Montez Hageman, DO   Chief Complaint  Patient presents with  . Follow-up    Seen for Dr. Jens Som    History of Present Illness:    Leonard Donovan is a 55 y.o. male with a hx of hypertension, glaucoma, Covid infection in January 2022, prediabetes, recent diagnosed CAD s/p CABG and CVA.  Patient presented to the hospital in February 2022 with diffuse chest pain for 2 weeks and ruled in for NSTEMI.  Cardiac catheterization performed on 2/30/2022 revealed 75% proximal LAD, 80% mid left circumflex lesion, 75% OM1 lesion, 80% proximal RCA lesion, 75% PDA lesion.  Echocardiogram showed EF 60 to 65%, mild MR.  Patient was subsequently referred to cardiothoracic surgery service and underwent successful CABG x4 (Lima-LAD, SVG-PD, distal LCx and OM1) by Dr. Nydia Bouton on 07/08/2020.  Preoperative carotid duplex showed no significant left carotid artery disease, 40 to 59% right carotid artery disease.  Creatinine was initially elevated, but returned to normal prior to discharge.  Unfortunately after his discharge from the hospital, he returned back to the hospital on 07/08/2020 due to slurred speech, left upper extremity weakness and was diagnosed with stroke based on MRI finding.  MRI demonstrated multiple small acute or subacute early infarct within the bilateral frontal and parietal white matter consistent with central embolic source.  Neurology was consulted and recommended to continue on aspirin and Plavix along with Lipitor.  Neurology service also recommended a 30-day event monitor as well.  Repeat echocardiogram showed EF 60 to 65%, no intracardiac source of embolism detected.  I last saw the  patient on 07/29/2020, at which time he said he had an at least 3 episodes of syncope or near syncope since discharge.  While in the exam room, he became very diaphoretic and felt like he was going to pass out.  CODE BLUE was triggered.  Systolic blood pressure went down to the 80s.  He was orthostatic afterward as well.  All of his blood pressure medication were discontinued.  By the time he was followed up a week later, blood pressure has bounced back to the high side, Toprol-XL was started at 25 mg daily.  I further recommended increasing Toprol-XL to 50 mg daily.  Patient presents today for follow-up along with his wife.  Blood pressure remained quite elevated.  He has restarted losartan at 25 mg daily.  I will further increase losartan to 50 mg daily.  He denies any chest pain and has been walking.  He is okay to restart cardiac rehab at this time.  Otherwise he can follow-up with Dr. Jens Som in May.   Past Medical History:  Diagnosis Date  . CAD (coronary artery disease) of bypass graft    CABG x 4  . Glaucoma   . H/O: CVA (cerebrovascular accident) 2022  . Hyperlipidemia LDL goal <70   . Hypertension     Past Surgical History:  Procedure Laterality Date  . CORONARY ARTERY BYPASS GRAFT N/A 07/08/2020   Procedure: CORONARY ARTERY BYPASS GRAFTING (CABG), ON PUMP, TIMES FOUR, LEFT INTERNAL MAMMARY ARTERY TO LAD, RIGHT SVG TO PDA, DISTAL CIRCUMFLEX, AND OM1;  Surgeon: Delight Ovens, MD;  Location: [facility] OR;  Service: Open Heart Surgery;  Laterality: N/A;  . ENDOVEIN HARVEST OF GREATER SAPHENOUS VEIN Right 07/08/2020   Procedure: ENDOVEIN HARVEST OF RIGHT GREATER SAPHENOUS VEIN;  Surgeon: Delight Ovens, MD;  Location: [facility] OR;  Service: Open Heart Surgery;  Laterality: Right;  . LEFT HEART CATH AND CORONARY ANGIOGRAPHY N/A 07/02/2020   Procedure: LEFT HEART CATH AND CORONARY ANGIOGRAPHY;  Surgeon: Corky Crafts, MD;  Location: [facility] INVASIVE CV LAB;  Service: Cardiovascular;  Laterality:  N/A;  . NO PAST SURGERIES    . TEE WITHOUT CARDIOVERSION N/A 07/08/2020   Procedure: TRANSESOPHAGEAL ECHOCARDIOGRAM (TEE);  Surgeon: Delight Ovens, MD;  Location: [facility] OR;  Service: Open Heart Surgery;  Laterality: N/A;    Current Medications: Current Meds  Medication Sig  . acetaminophen (TYLENOL) 500 MG tablet Take 2 tablets (1,000 mg total) by mouth every 8 (eight) hours as needed for mild pain (or discomfort).  Marland Kitchen aspirin EC 81 MG EC tablet Take 1 tablet (81 mg total) by mouth daily. Swallow whole.  Marland Kitchen atorvastatin (LIPITOR) 80 MG tablet Take 1 tablet (80 mg total) by mouth daily.  . clopidogrel (PLAVIX) 75 MG tablet Take 1 tablet (75 mg total) by mouth daily.  . metoprolol succinate (TOPROL XL) 50 MG 24 hr tablet Take 1 tablet (50 mg total) by mouth daily.  . Multiple Vitamins-Minerals (MULTIVITAMIN WITH MINERALS) tablet Take 1 tablet by mouth daily.  . nitroGLYCERIN (NITROSTAT) 0.4 MG SL tablet Place 1 tablet (0.4 mg total) under the tongue every 5 (five) minutes as needed.  . [DISCONTINUED] losartan (COZAAR) 25 MG tablet Take 1 tablet (25 mg total) by mouth every evening.     Allergies:   Patient has no known allergies.   Social History   Socioeconomic History  . Marital status: Married    Spouse name: Not on file  . Number of children: Not on file  . Years of education: Not on file  . Highest education level: Not on file  Occupational History  . Not on file  Tobacco Use  . Smoking status: Former Games developer  . Smokeless tobacco: Former Clinical biochemist  . Vaping Use: Never used  Substance and Sexual Activity  . Alcohol use: Not Currently  . Drug use: Not Currently  . Sexual activity: Not on file  Other Topics Concern  . Not on file  Social History Narrative  . Not on file   Social Determinants of Health   Financial Resource Strain: Not on file  Food Insecurity: Not on file  Transportation Needs: Not on file  Physical Activity: Not on file  Stress: Not on file   Social Connections: Not on file     Family History: The patient's family history includes CAD in his sister; Heart attack in his sister; Hypertension in his father.  ROS:   Please see the history of present illness.     All other systems reviewed and are negative.  EKGs/Labs/Other Studies Reviewed:    The following studies were reviewed today:  Echo 07/21/2020 1. Left ventricular ejection fraction, by estimation, is 60 to 65%. The  left ventricle has normal function. The left ventricle has no regional  wall motion abnormalities. There is mild concentric left ventricular  hypertrophy. Left ventricular diastolic  parameters are indeterminate.  2. Right ventricular systolic function is low normal. The right  ventricular size is normal.  3. Left atrial size was mildly dilated.  4. The mitral valve is normal in structure.  Trivial mitral valve  regurgitation.  5. The aortic valve is tricuspid. Aortic valve regurgitation is not  visualized. No aortic stenosis is present.  6. The inferior vena cava is normal in size with greater than 50%  respiratory variability, suggesting right atrial pressure of 3 mmHg.   EKG:  EKG is not ordered today.    Recent Labs: 07/09/2020: Magnesium 2.5 07/20/2020: ALT 17 07/29/2020: BUN 21; Creatinine, Ser 1.07; Hemoglobin 13.0; Platelets 258; Potassium 4.5; Sodium 139; TSH 6.020  Recent Lipid Panel    Component Value Date/Time   CHOL 103 07/21/2020 0500   TRIG 148 07/21/2020 0500   HDL 24 (L) 07/21/2020 0500   CHOLHDL 4.3 07/21/2020 0500   VLDL 30 07/21/2020 0500   LDLCALC 49 07/21/2020 0500     Risk Assessment/Calculations:       Physical Exam:    VS:  BP (!) 160/104   Pulse 76   Ht 5\' 9"  (1.753 m)   Wt 220 lb 12.8 oz (100.2 kg)   SpO2 98%   BMI 32.61 kg/m     Wt Readings from Last 3 Encounters:  08/26/20 220 lb 12.8 oz (100.2 kg)  08/24/20 219 lb (99.3 kg)  08/13/20 218 lb (98.9 kg)     GEN:  Well nourished, well developed  in no acute distress HEENT: Normal NECK: No JVD; No carotid bruits LYMPHATICS: No lymphadenopathy CARDIAC: RRR, no murmurs, rubs, gallops RESPIRATORY:  Clear to auscultation without rales, wheezing or rhonchi  ABDOMEN: Soft, non-tender, non-distended MUSCULOSKELETAL:  No edema; No deformity  SKIN: Warm and dry NEUROLOGIC:  Alert and oriented x 3 PSYCHIATRIC:  Normal affect   ASSESSMENT:    1. Coronary artery disease involving coronary bypass graft of native heart without angina pectoris   2. Medication management   3. H/O: CVA (cerebrovascular accident)   4. Primary hypertension   5. Pre-diabetes    PLAN:    In order of problems listed above:  1. CAD s/p CABG: Denies any chest pain.  Continue aspirin and Plavix  2. History of CVA: Recent CVA after bypass surgery.  He was discharged on 30-day monitor to rule out atrial fibrillation  3. Hypertension: Blood pressure elevated, increase losartan to 50 mg daily  4. Prediabetes: Managed by primary care provider.        Medication Adjustments/Labs and Tests Ordered: Current medicines are reviewed at length with the patient today.  Concerns regarding medicines are outlined above.  Orders Placed This Encounter  Procedures  . Basic metabolic panel   Meds ordered this encounter  Medications  . DISCONTD: losartan (COZAAR) 50 MG tablet    Sig: Take 1 tablet (50 mg total) by mouth every evening.    Dispense:  30 tablet    Refill:  6    Patient Instructions  Medication Instructions:  INCREASE LOSARTAN 50MG  DAILY *If you need a refill on your cardiac medications before your next appointment, please call your pharmacy*  Lab Work:    BMET IN 2-3 WEEKS-THIS IS NOT FASTING   Follow-Up: Your next appointment:  KEEP SCHEDULED FOLLOW UP  In Person with Olga MillersBrian Crenshaw, MD   At [facility], you and your health needs are our priority.  As part of our continuing mission to provide you with exceptional heart care, we have created  designated Provider Care Teams.  These Care Teams include your primary Cardiologist (physician) and Advanced Practice Providers (APPs -  Physician Assistants and Nurse Practitioners) who all work together to provide you with  the care you need, when you need it.     Ramond Dial, [place]  08/27/2020 9:08 PM    [facility]

## 2020-08-26 NOTE — Patient Instructions (Signed)
Medication Instructions:  INCREASE LOSARTAN 50MG  DAILY *If you need a refill on your cardiac medications before your next appointment, please call your pharmacy*  Lab Work:    BMET IN 2-3 WEEKS-THIS IS NOT FASTING   Follow-Up: Your next appointment:  KEEP SCHEDULED FOLLOW UP  In Person with , MD   At [facility], you and your health needs are our priority.  As part of our continuing mission to provide you with exceptional heart care, we have created designated Provider Care Teams.  These Care Teams include your primary Cardiologist (physician) and Advanced Practice Providers (APPs -  Physician Assistants and Nurse Practitioners) who all work together to provide you with the care you need, when you need it.

## 2020-08-27 ENCOUNTER — Other Ambulatory Visit (HOSPITAL_COMMUNITY): Payer: Self-pay | Admitting: *Deleted

## 2020-08-27 ENCOUNTER — Encounter: Payer: Self-pay | Admitting: Physician Assistant

## 2020-08-27 DIAGNOSIS — R1310 Dysphagia, unspecified: Secondary | ICD-10-CM

## 2020-09-01 ENCOUNTER — Ambulatory Visit (HOSPITAL_COMMUNITY)
Admission: RE | Admit: 2020-09-01 | Discharge: 2020-09-01 | Disposition: A | Discharge status: Home / Self Care | Payer: BC Managed Care – PPO | Source: Ambulatory Visit | Attending: Adult Health | Admitting: Adult Health

## 2020-09-01 ENCOUNTER — Other Ambulatory Visit: Payer: Self-pay

## 2020-09-01 DIAGNOSIS — R1310 Dysphagia, unspecified: Secondary | ICD-10-CM | POA: Insufficient documentation

## 2020-09-01 DIAGNOSIS — I69319 Unspecified symptoms and signs involving cognitive functions following cerebral infarction: Secondary | ICD-10-CM | POA: Insufficient documentation

## 2020-09-01 NOTE — Progress Notes (Signed)
Modified Barium Swallow Progress Note  Patient Details  Name: Leonard Donovan MRN: [id number] Date of Birth: 1965-10-22  Today's Date: 09/01/2020  Modified Barium Swallow completed.  Full report located under Chart Review in the Imaging Section.  Brief recommendations include the following:  Clinical Impression  Pt demonstrates normal swallowing ability. He is noted today have a brief oral hold prior to swallow initiation, which he reports is a kind of compensatory strategy he has developed to avoid coughing. When taking consecutive straw sips pt did have a few instance of premautre spillage/delayed swallow though no aspiraiton or penetration occurred. Esophageal sweep appears WNL. Pt recommended to continue current diet with added precautions of slow rate, avoiding distractions and a brief oral hold as needed, avoid straws. No SLP f/u needed at this time.   Swallow Evaluation Recommendations       SLP Diet Recommendations: Regular solids;Thin liquid   Liquid Administration via: Cup;Straw                          Harlon Ditty, MA CCC-SLP  Acute Rehabilitation Services Pager [phone] Office [phone]   Claudine Mouton 09/01/2020,1:14 PM

## 2020-09-02 ENCOUNTER — Telehealth: Payer: Self-pay | Admitting: *Deleted

## 2020-09-02 ENCOUNTER — Other Ambulatory Visit: Payer: Self-pay | Admitting: Cardiology

## 2020-09-02 DIAGNOSIS — I4891 Unspecified atrial fibrillation: Secondary | ICD-10-CM

## 2020-09-02 DIAGNOSIS — I639 Cerebral infarction, unspecified: Secondary | ICD-10-CM

## 2020-09-02 MED ORDER — LOSARTAN POTASSIUM 100 MG PO TABS: 100.0000 mg | ORAL_TABLET | Freq: Every day | ORAL | 3 refills | Status: DC

## 2020-09-02 MED ORDER — LOSARTAN POTASSIUM 50 MG PO TABS: 50.0000 mg | ORAL_TABLET | Freq: Every day | ORAL | 3 refills | Status: DC

## 2020-09-02 NOTE — Telephone Encounter (Addendum)
Left message for pt to call    ----- Message from Lewayne Bunting, MD sent at 09/02/2020 11:03 AM EDT ----- No atrial fibrillation noted.  Mildly bradycardic.  Decrease Toprol to 25 mg daily. [name]

## 2020-09-02 NOTE — Telephone Encounter (Signed)
Spoke with pt WIFE, Aware of dr Ludwig Clarks recommendations.  New script sent to the pharmacy, they already have lab work orders.

## 2020-09-02 NOTE — Telephone Encounter (Signed)
Patient's wife is returning call. 

## 2020-09-02 NOTE — Telephone Encounter (Signed)
Spoke with pt wife, she reports he was on coreg at the time of the monitor and that medication has been stopped. He was then started on metoprolol at 25 mg daily. His bp has been running high and they will not let him exercise at cardiac rehab. His metoprolol was increased to 50 mg 2 weeks ago, his pulse is 60-70 bpm and he was started on losartan 25 mg. 1 weeks ago the losartan was increased to 50 mg.  His wife reports his bp is still running 160-170/110-115. Will forward for dr Jens Som review

## 2020-09-02 NOTE — Telephone Encounter (Signed)
Increase losartan to 100 mg daily.  Check potassium and renal function in 1 week.  Follow blood pressure. [name], MD

## 2020-09-04 ENCOUNTER — Other Ambulatory Visit: Payer: Self-pay | Admitting: Physician Assistant

## 2020-09-08 ENCOUNTER — Other Ambulatory Visit: Payer: Self-pay

## 2020-09-08 MED ORDER — CLOPIDOGREL BISULFATE 75 MG PO TABS: 75.0000 mg | ORAL_TABLET | Freq: Every day | ORAL | 3 refills | Status: DC

## 2020-09-08 MED ORDER — ATORVASTATIN CALCIUM 80 MG PO TABS: 80.0000 mg | ORAL_TABLET | Freq: Every day | ORAL | 3 refills | Status: DC

## 2020-09-09 ENCOUNTER — Other Ambulatory Visit: Payer: Self-pay

## 2020-09-09 ENCOUNTER — Ambulatory Visit: Payer: BC Managed Care – PPO | Attending: Adult Health

## 2020-09-09 ENCOUNTER — Ambulatory Visit: Payer: BC Managed Care – PPO | Admitting: Occupational Therapy

## 2020-09-09 ENCOUNTER — Encounter: Payer: Self-pay | Admitting: Occupational Therapy

## 2020-09-09 DIAGNOSIS — R41841 Cognitive communication deficit: Secondary | ICD-10-CM | POA: Diagnosis present

## 2020-09-09 DIAGNOSIS — R41840 Attention and concentration deficit: Secondary | ICD-10-CM

## 2020-09-09 DIAGNOSIS — M25512 Pain in left shoulder: Secondary | ICD-10-CM | POA: Diagnosis present

## 2020-09-09 DIAGNOSIS — I69354 Hemiplegia and hemiparesis following cerebral infarction affecting left non-dominant side: Secondary | ICD-10-CM | POA: Diagnosis present

## 2020-09-09 DIAGNOSIS — M6281 Muscle weakness (generalized): Secondary | ICD-10-CM | POA: Insufficient documentation

## 2020-09-09 DIAGNOSIS — R4701 Aphasia: Secondary | ICD-10-CM | POA: Diagnosis present

## 2020-09-09 DIAGNOSIS — R2681 Unsteadiness on feet: Secondary | ICD-10-CM | POA: Diagnosis present

## 2020-09-09 DIAGNOSIS — R471 Dysarthria and anarthria: Secondary | ICD-10-CM | POA: Diagnosis present

## 2020-09-09 DIAGNOSIS — R1311 Dysphagia, oral phase: Secondary | ICD-10-CM

## 2020-09-09 NOTE — Therapy (Signed)
[facility] [address] [place], [place], [address] Phone: [phone]   Fax:  [phone]  Occupational Therapy Evaluation  Patient Details  Name: Leonard Donovan MRN: [id number] Date of Birth: 08/10/1965 Referring Provider (OT): Ihor AustinJessica McCue   Encounter Date: 09/09/2020   OT End of Session - 09/09/20 1803    Visit Number 1    Number of Visits 17    Date for OT Re-Evaluation 11/23/20    Authorization Type BCBS:  [id number]    OT Start Time 1700    OT Stop Time 1745    OT Time Calculation (min) 45 min    Activity Tolerance Patient tolerated treatment well    Behavior [facility] for tasks assessed/performed           Past Medical History:  Diagnosis Date  . CAD (coronary artery disease) of bypass graft    CABG x 4  . Glaucoma   . H/O: CVA (cerebrovascular accident) 2022  . Hyperlipidemia LDL goal <70   . Hypertension     Past Surgical History:  Procedure Laterality Date  . CORONARY ARTERY BYPASS GRAFT N/A 07/08/2020   Procedure: CORONARY ARTERY BYPASS GRAFTING (CABG), ON PUMP, TIMES FOUR, LEFT INTERNAL MAMMARY ARTERY TO LAD, RIGHT SVG TO PDA, DISTAL CIRCUMFLEX, AND OM1;  Surgeon: Delight OvensGerhardt, Edward B, MD;  Location: [facility] OR;  Service: Open Heart Surgery;  Laterality: N/A;  . ENDOVEIN HARVEST OF GREATER SAPHENOUS VEIN Right 07/08/2020   Procedure: ENDOVEIN HARVEST OF RIGHT GREATER SAPHENOUS VEIN;  Surgeon: Delight OvensGerhardt, Edward B, MD;  [facility];  Service: Open Heart Surgery;  Laterality: Right;  . LEFT HEART CATH AND CORONARY ANGIOGRAPHY N/A 07/02/2020   Procedure: LEFT HEART CATH AND CORONARY ANGIOGRAPHY;  Surgeon: Corky CraftsVaranasi, Jayadeep S, MD;  [facility];  Service: Cardiovascular;  Laterality: N/A;  . NO PAST SURGERIES    . TEE WITHOUT CARDIOVERSION N/A 07/08/2020   Procedure: TRANSESOPHAGEAL ECHOCARDIOGRAM (TEE);  Surgeon: Delight OvensGerhardt, Edward B, MD;  [facility];  Service: Open Heart Surgery;   Laterality: N/A;    There were no vitals filed for this visit.   Subjective Assessment - 09/09/20 1704    Subjective  My shoulder is killing me - left    Patient is accompanied by: Family member   wife Albin FellingCarla   Currently in Pain? Yes    Pain Score 6     Pain Location Shoulder    Pain Orientation Left    Pain Descriptors / Indicators Aching    Pain Type Chronic pain    Pain Onset 1 to 4 weeks ago    Pain Frequency Constant    Aggravating Factors  lying down    Pain Relieving Factors unsure             [facility] - 09/09/20 1706      Assessment   Medical Diagnosis stroke    Referring Provider (OT) Ihor AustinJessica McCue    Onset Date/Surgical Date 07/20/20    Hand Dominance Right    Prior Therapy No      Precautions   Precautions --   20 lb lifting restriction     Restrictions   Other Position/Activity Restrictions 20 lb lifting restriction      Prior Function   Level of Independence Independent with basic ADLs    Vocation Full time employment    Engineer, 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 - [employer]    Leisure hunt, fish  ADL   Eating/Feeding Set up    Grooming Set up   pain with applying deodorant under right arm   Upper Body Bathing Modified independent    Lower Body Bathing Modified independent    Upper Body Dressing Increased time    Lower Body Dressing Increased time    Toilet Transfer Modified independent    Tub/Shower Transfer Modified independent      IADL   Prior Level of Function Medication Managment Did not take medication prior    Medication Management Takes responsibility if medication is prepared in advance in seperate dosage      Written Expression   Dominant Hand Right      Vision - History   Baseline Vision Wears glasses only for reading    Visual History Glaucoma    Additional Comments Needs follow up for glaucoma      Vision Assessment   Eye Alignment Within Functional Limits    Ocular Range of Motion  Within Functional Limits    Alignment/Gaze Preference Within Defined Limits    Tracking/Visual Pursuits Able to track stimulus in all quads without difficulty      Activity Tolerance   Activity Tolerance Comments Currently in cardiac rehab      Cognition   Overall Cognitive Status Impaired/Different from baseline    Area of Impairment Memory;Attention;Awareness;Safety/judgement    Cognition Comments Reduced attention and memory - wife reports this is vastly different from prior personality      Posture/Postural Control   Posture/Postural Control Postural limitations    Postural Limitations Rounded Shoulders    Posture Comments sternal scar tight, limited pelvic range      Sensation   Light Touch Appears Intact    Hot/Cold Appears Intact    Proprioception Appears Intact      Coordination   Gross Motor Movements are Fluid and Coordinated No    Fine Motor Movements are Fluid and Coordinated No    Finger Nose Finger Test undershooting left    9 Hole Peg Test Right;Left    Right 9 Hole Peg Test 20.09    Left 9 Hole Peg Test 22.97      ROM / Strength   AROM / PROM / Strength AROM;Strength;PROM      AROM   Overall AROM  Deficits    Overall AROM Comments shoulder      PROM   Overall PROM  Deficits    Overall PROM Comments shoulder      Strength   Overall Strength Deficits    Overall Strength Comments shoulder      Hand Function   Right Hand Gross Grasp Functional    Right Hand Grip (lbs) 60    Right Hand Lateral Pinch 24 lbs    Left Hand Gross Grasp Impaired    Left Hand Grip (lbs) 25    Left Hand Lateral Pinch 18 lbs                           OT Education - 09/09/20 1803    Education Details Eval results and plan of care - potential goals    Person(s) Educated Patient;Spouse    Methods Explanation    Comprehension Need further instruction            OT Short Term Goals - 09/09/20 1811      OT SHORT TERM GOAL #1   Title Patient will  complete an HEP designed to improve grip strength in  left hand    Time 4    Period Weeks    Status New      OT SHORT TERM GOAL #2   Title Patient will complete an HEP designed to improve range of motion and decrease pain in left shoulder    Time 4    Period Weeks    Status New      OT SHORT TERM GOAL #3   Title Patient will report greater ease in  washing lower legs and donning socks.    Time 4    Period Weeks    Status New      OT SHORT TERM GOAL #4   Title Patient will report improved ability to get onto and off of lawn mower (management of left leg)    Time 4    Period Weeks    Status New             OT Long Term Goals - 09/09/20 1814      OT LONG TERM GOAL #1   Title Patient will complete an updated HEP to address LUE functioning    Time 8    Period Weeks    Status New      OT LONG TERM GOAL #2   Title Patient will demonstrate 5 lb increase in left grip strength to improve functional grasp    Time 4    Period Weeks    Status New      OT LONG TERM GOAL #3   Title Patient will reach overhead to obtain and place an object no heavier than 2 lbs x 3 without increased pain.    Time 8    Period Weeks    Status New      OT LONG TERM GOAL #4   Title Patient and wife will demonstrate understanding of return to driving recommendations    Time 8    Period Weeks    Status New      OT LONG TERM GOAL #5   Title Patient and wife will demonstrate understanding of return to work recommendations    Time 8    Period Weeks    Status New                 Plan - 09/09/20 1804    Clinical Impression Statement Patient is a 55 yr old male s/p CABGX4 07/08/20, and multifocal infarcts namely R pontine (with multiple small bilateral frontal and parietal infarcts)  Patient presents to OT eval with wife Albin Felling.  Patient is improving in his ability to complete basic self care tasks, but is time intensive, and labored.  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  Patient will benefit from skilled OT intervention to improve particiaption in ADL/IADL, and improve functional use / decrease pain in LUE.    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 ADL;Coordination;Endurance;GMC;Muscle spasms;UE functional use;Vestibular;Decreased knowledge of precautions;Balance;Body mechanics;Decreased knowledge of use of DME;Flexibility;IADL;Pain;Strength;FMC;Dexterity;Cardiopulmonary status limiting activity;Mobility;ROM;Tone    Cognitive Skills Attention;Emotional;Energy/Drive;Learn;Memory;Temperament/Personality;Sequencing;Safety Awareness;Problem Solve;Perception    Rehab Potential Good    Clinical Decision Making  Several treatment options, min-mod task modification necessary    Comorbidities Affecting Occupational Performance: May have comorbidities impacting occupational performance    Modification or Assistance to Complete Evaluation  Min-Moderate modification of tasks or assist with assess necessary to complete eval    OT Frequency 2x / week    OT Duration 8 weeks    OT Treatment/Interventions Self-care/ADL training;Electrical Stimulation;Therapeutic exercise;Patient/family education;Splinting;Neuromuscular education;Paraffin;Moist Heat;Aquatic Therapy;Fluidtherapy;Energy conservation;Building services engineer;Therapeutic activities;Balance training;Cryotherapy;Ultrasound;Contrast Bath;DME and/or AE instruction;Manual Therapy;Passive range of  motion;Cognitive remediation/compensation    Plan Address shoulder pain- supine, gentle mobilization and AAROM    Consulted and Agree with Plan of Care Patient;Family member/caregiver    Family Member Consulted Wife Carla           Patient will benefit from skilled therapeutic intervention in order to improve the following deficits and impairments:   Body Structure / Function / Physical Skills: ADL,Coordination,Endurance,GMC,Muscle spasms,UE functional use,Vestibular,Decreased knowledge of precautions,Balance,Body mechanics,Decreased knowledge of use of DME,Flexibility,IADL,Pain,Strength,FMC,Dexterity,Cardiopulmonary status limiting activity,Mobility,ROM,Tone Cognitive Skills: Attention,Emotional,Energy/Drive,Learn,Memory,Temperament/Personality,Sequencing,Safety Awareness,Problem Solve,Perception     Visit Diagnosis: Hemiplegia and hemiparesis following cerebral infarction affecting left non-dominant side (HCC) - Plan: Ot plan of care cert/re-cert  Acute pain of left shoulder - Plan: Ot plan of care cert/re-cert  Muscle weakness (generalized) - Plan: Ot plan of care cert/re-cert  Attention and concentration deficit - Plan: Ot plan of care cert/re-cert  Unsteadiness on feet - Plan: Ot plan of care cert/re-cert    Problem List Patient Active Problem List   Diagnosis Date Noted  . Orthostatic syncope 08/06/2020  . CVA (cerebral vascular accident) (HCC) 07/21/2020  . Acute CVA (cerebrovascular accident) (HCC) 07/20/2020  . Glaucoma   . Hypertension   . S/P CABG x 4   . Coronary artery disease 07/08/2020  . NSTEMI (non-ST elevated myocardial infarction) (HCC) 07/02/2020  . Hypertensive emergency 07/01/2020    Collier Salina, OTR/L 09/09/2020, 6:18 PM  [place] [facility] [address] [place], [place], [address] Phone: [phone]   Fax:  [phone]  Name: Leonard Donovan MRN: [id number] Date of Birth: [birth year]

## 2020-09-09 NOTE — Therapy (Signed)
[facility] [address] [place], [place], [address] Phone: [phone]   Fax:  [phone]  Speech Language Pathology Evaluation  Patient Details  Name: Leonard Donovan MRN: [id number] Date of Birth: May 02, 1966 Referring Provider (SLP): Ihor Austin NP   Encounter Date: 09/09/2020   End of Session - 09/09/20 1808    Visit Number 1    Number of Visits 17    Date for SLP Re-Evaluation 12/08/20    Authorization Type BCBS    SLP Start Time 1618    SLP Stop Time  1700    SLP Time Calculation (min) 42 min    Activity Tolerance Patient tolerated treatment well           Past Medical History:  Diagnosis Date  . CAD (coronary artery disease) of bypass graft    CABG x 4  . Glaucoma   . H/O: CVA (cerebrovascular accident) 2022  . Hyperlipidemia LDL goal <70   . Hypertension     Past Surgical History:  Procedure Laterality Date  . CORONARY ARTERY BYPASS GRAFT N/A 07/08/2020   Procedure: CORONARY ARTERY BYPASS GRAFTING (CABG), ON PUMP, TIMES FOUR, LEFT INTERNAL MAMMARY ARTERY TO LAD, RIGHT SVG TO PDA, DISTAL CIRCUMFLEX, AND OM1;  Surgeon: Delight Ovens, MD;  Location: [facility] OR;  Service: Open Heart Surgery;  Laterality: N/A;  . ENDOVEIN HARVEST OF GREATER SAPHENOUS VEIN Right 07/08/2020   Procedure: ENDOVEIN HARVEST OF RIGHT GREATER SAPHENOUS VEIN;  Surgeon: Delight Ovens, MD;  Location: [facility] OR;  Service: Open Heart Surgery;  Laterality: Right;  . LEFT HEART CATH AND CORONARY ANGIOGRAPHY N/A 07/02/2020   Procedure: LEFT HEART CATH AND CORONARY ANGIOGRAPHY;  Surgeon: Corky Crafts, MD;  [facility]: [facility]VASIVE CV LAB;  Service: Cardiovascular;  Laterality: N/A;  . NO PAST SURGERIES    . TEE WITHOUT CARDIOVERSION N/A 07/08/2020   Procedure: TRANSESOPHAGEAL ECHOCARDIOGRAM (TEE);  Surgeon: Delight Ovens, MD;  Location: [facility] OR;  Service: Open Heart Surgery;  Laterality: N/A;    There were no vitals filed for this  visit.       SLP Evaluation [facility] - 09/09/20 1623      SLP Visit Information   SLP Received On 08/24/20    Referring Provider (SLP) Ihor Austin NP    Onset Date February 2021    Medical Diagnosis CVA      Subjective   Subjective reduced awareness of deficits    Patient/Family Stated Goal to improve swallow function, cognition, and communication      Pain Assessment   Currently in Pain? --      General Information   HPI Leonard Donovan is a 55 y.o. male with history of right pontine infarct and multiple small acute/subacute infarcts within the bilateral frontal and parietal white matter.      Balance Screen   Has the patient fallen in the past 6 months No    Has the patient had a decrease in activity level because of a fear of falling?  Yes      Prior Functional Status   Cognitive/Linguistic Baseline Within functional limits    Type of Home House     Lives With Spouse    Available Support Family;Friend(s)    Education HS    Vocation On disability   carpenter, [place]     Cognition   Overall Cognitive Status Impaired/Different from baseline    Area of Impairment Attention;Memory;Safety/judgement    Current Attention Level Selective;Alternating;Divided  Memory Decreased short-term memory    Safety/Judgement Decreased awareness of deficits      Auditory Comprehension   Overall Auditory Comprehension Appears within functional limits for tasks assessed      Verbal Expression   Overall Verbal Expression Appears within functional limits for tasks assessed      Oral Motor/Sensory Function   Overall Oral Motor/Sensory Function Impaired    Lingual ROM Within Functional Limits    Lingual Symmetry Within Functional Limits    Lingual Coordination Reduced      Motor Speech   Overall Motor Speech Impaired    Respiration Impaired    Level of Impairment Conversation    Phonation Normal    Articulation Impaired    Level of Impairment Conversation    Intelligibility  Intelligibility reduced    Conversation 50-74% accurate    Motor Planning Witnin functional limits    Effective Techniques Over-articulate;Increased vocal intensity;Slow rate      Standardized Assessments   Standardized Assessments  Other Assessment   SLUMS= 21/30 (mild)                          SLP Education - 09/09/20 1750    Education Details eval results, possible goals, dysarthria and attention compensations    Person(s) Educated Patient;Spouse    Methods Explanation;Demonstration    Comprehension Verbalized understanding;Returned demonstration;Need further instruction            SLP Short Term Goals - 09/09/20 1809      SLP SHORT TERM GOAL #1   Title Pt will verbalize functional application of 2 memory/attention strategies for home/work environments with occasional min A over 2 sessions    Time 4    Period Weeks    Status New      SLP SHORT TERM GOAL #2   Title Pt will demonstrate speech compensations for 100% intelligibility in 10 simple conversation with rare min A over 2 sessions    Time 4    Period Weeks    Status New      SLP SHORT TERM GOAL #3   Title Pt will verbalize and demonstrate recommended swallow strategies in therapy and at home with min A over 2 sessions    Time 4    Period Weeks    Status New      SLP SHORT TERM GOAL #4   Title Pt will be able to verbalize three non-physical deficits by giving examples from home or within ST session in 2 sessions    Time 4    Period Weeks    Status New            SLP Long Term Goals - 09/09/20 1825      SLP LONG TERM GOAL #1   Title Pt will verbalize functional application of 3 memory/attention strategies for home/work environments with rare min A over 2 sessions    Time 8    Period Weeks    Status New      SLP LONG TERM GOAL #2   Title Pt will demonstrate speech compensations for 100% intelligibility in 15 mod complex conversation with rare min A over 2 sessions    Time 8    Period  Weeks    Status New      SLP LONG TERM GOAL #3   Title Pt and wife will report consistent carryover of recommended swallow strategies and reduced s/sx of aspiration over 3 sessions    Time 8  Period Weeks    Status New      SLP LONG TERM GOAL #4   Title Pt will demonstrate improved insight and awareness of current deficits and impact on his performance with both work and home environments with rare min A over 2 sessions    Time 8    Period Weeks    Status New            Plan - 09/09/20 1813    Clinical Impression Statement Leonard Donovan was referred for OPST evaluation to assess cognitive communication and dysphagia s/p CVA in February 2021. Pt stated "I slur a little bit" particularly when fatigued. Occasional reduced speech intelligibility exhibited in conversation and reading tasks as a result of decreased volume and fast rate of speech. Pt denied any word finding or cognitive changes; however, moderate cognitive deficits indicated during hospitalization. SLP re-administered SLUMS to compare baseline score (19 during hospitalization), in which pt scored 21/30 indicating mild cognitive impairment. Overt deficits exhibited for sustained and selective attention, with pt frequently gazing out window or laughing during assessment. Pt only aware of memory and attention deficits following review of evaluation as pt presented with limited insight into his deficits upon entrance. MBSS recently completed (09-01-20) with swallow function deemed [facility] despite mild oral holding. However, pt and his wife report pt still gets "choked on water" and recently noted to overfill oral cavity heightening risk of choking. Pt reported swallowing trouble occurs when he becomes distracted and forgets to use recommended swallow strategies (slow rate, reduce distractions, avoid straws, and brief oral hold as needed). SLP assessed pt's carryover of strategies with cup of water, with no coughing and single throat clear noted x1. Pt  would benefit from skilled ST intervention to address mild dysarthria, mild cognitive communication deficits, and mild oral dysphagia to aid patient in return to PLOF and work, reduce communication breakdown, and decrease aspiration risk.    Speech Therapy Frequency 2x / week    Duration 8 weeks   or 17 total visits   Treatment/Interventions Compensatory strategies;Patient/family education;Functional tasks;Cueing hierarchy;Multimodal communcation approach;Cognitive reorganization;Environmental controls;Diet toleration management by SLP;Aspiration precaution training;Compensatory techniques;Internal/external aids;SLP instruction and feedback    Potential to Achieve Goals Fair    Potential Considerations Cooperation/participation level    SLP Home Exercise Plan provided    Consulted and Agree with Plan of Care Patient;Family member/caregiver           Patient will benefit from skilled therapeutic intervention in order to improve the following deficits and impairments:   Cognitive communication deficit  Dysarthria and anarthria  Dysphagia, oral phase    Problem List Patient Active Problem List   Diagnosis Date Noted  . Orthostatic syncope 08/06/2020  . CVA (cerebral vascular accident) (HCC) 07/21/2020  . Acute CVA (cerebrovascular accident) (HCC) 07/20/2020  . Glaucoma   . Hypertension   . S/P CABG x 4   . Coronary artery disease 07/08/2020  . NSTEMI (non-ST elevated myocardial infarction) (HCC) 07/02/2020  . Hypertensive emergency 07/01/2020    Janann Colonel, MA CCC-SLP 09/09/2020, 6:37 PM  [place] [facility] [address] [place], [place], [address] Phone: [phone]   Fax:  [phone]  Name: Leonard Donovan MRN: [id number] Date of Birth: 05-27-1966

## 2020-09-09 NOTE — Patient Instructions (Addendum)

## 2020-09-15 ENCOUNTER — Encounter: Payer: Self-pay | Admitting: Occupational Therapy

## 2020-09-15 ENCOUNTER — Other Ambulatory Visit: Payer: Self-pay | Admitting: Physician Assistant

## 2020-09-15 ENCOUNTER — Ambulatory Visit: Payer: BC Managed Care – PPO

## 2020-09-15 ENCOUNTER — Other Ambulatory Visit: Payer: Self-pay

## 2020-09-15 ENCOUNTER — Ambulatory Visit: Payer: BC Managed Care – PPO | Admitting: Occupational Therapy

## 2020-09-15 DIAGNOSIS — I69354 Hemiplegia and hemiparesis following cerebral infarction affecting left non-dominant side: Secondary | ICD-10-CM

## 2020-09-15 DIAGNOSIS — R4701 Aphasia: Secondary | ICD-10-CM

## 2020-09-15 DIAGNOSIS — R41840 Attention and concentration deficit: Secondary | ICD-10-CM

## 2020-09-15 DIAGNOSIS — R2681 Unsteadiness on feet: Secondary | ICD-10-CM

## 2020-09-15 DIAGNOSIS — M6281 Muscle weakness (generalized): Secondary | ICD-10-CM

## 2020-09-15 DIAGNOSIS — R1311 Dysphagia, oral phase: Secondary | ICD-10-CM

## 2020-09-15 DIAGNOSIS — R41841 Cognitive communication deficit: Secondary | ICD-10-CM | POA: Diagnosis not present

## 2020-09-15 DIAGNOSIS — M25512 Pain in left shoulder: Secondary | ICD-10-CM

## 2020-09-15 DIAGNOSIS — R471 Dysarthria and anarthria: Secondary | ICD-10-CM

## 2020-09-15 NOTE — Patient Instructions (Signed)

## 2020-09-15 NOTE — Patient Instructions (Addendum)
SELF ASSISTED WITH OBJECT:   Shoulder Flexion - Supine    Hold cane with both hands. Raise arms overhead, keep elbows straight. 10_ reps per set, 2-3 sets per day, _5_ days per week  Cane Horizontal - Supine    With straight arms holding cane above shoulders, bring cane out to right, center, out to left, and back to above head. Repeat 10_ times. Do 2-3_ times per day Copyright  VHI. All rights reserved.   Supine: Chest Press (Active)    Lie on back with arms fully extended. Lower bar or dowel slowly to chest and press to arm's length. Use cane Complete 3 sets of _10 repetitions.    Copyright  VHI. All rights reserved.   1. Grip Strengthening (Resistive Putty)   Squeeze putty using thumb and all fingers. Repeat _20___ times. Do __2__ sessions per day.   2. Roll putty into tube on table and pinch between each finger and thumb x 10 reps each. (can do ring and small finger together)     Copyright  VHI. All rights reserved.

## 2020-09-15 NOTE — Therapy (Signed)
[facility] [address] [place], [place], [address] Phone: [phone]   Fax:  [phone]  Occupational Therapy Treatment  Patient Details  Name: Leonard Donovan MRN: [id number] Date of Birth: 03/03/1966 Referring Provider (OT): Ihor Austin   Encounter Date: 09/15/2020   OT End of Session - 09/15/20 1619    Visit Number 2    Number of Visits 17    Date for OT Re-Evaluation 11/23/20    Authorization Type BCBS:  [id number]    OT Start Time 1620    OT Stop Time 1700    OT Time Calculation (min) 40 min    Activity Tolerance Patient tolerated treatment well    Behavior During Therapy [facility] for tasks assessed/performed           Past Medical History:  Diagnosis Date  . CAD (coronary artery disease) of bypass graft    CABG x 4  . Glaucoma   . H/O: CVA (cerebrovascular accident) 2022  . Hyperlipidemia LDL goal <70   . Hypertension     Past Surgical History:  Procedure Laterality Date  . CORONARY ARTERY BYPASS GRAFT N/A 07/08/2020   Procedure: CORONARY ARTERY BYPASS GRAFTING (CABG), ON PUMP, TIMES FOUR, LEFT INTERNAL MAMMARY ARTERY TO LAD, RIGHT SVG TO PDA, DISTAL CIRCUMFLEX, AND OM1;  Surgeon: Delight Ovens, MD;  Location: [facility] OR;  Service: Open Heart Surgery;  Laterality: N/A;  . ENDOVEIN HARVEST OF GREATER SAPHENOUS VEIN Right 07/08/2020   Procedure: ENDOVEIN HARVEST OF RIGHT GREATER SAPHENOUS VEIN;  Surgeon: Delight Ovens, MD;  Location: [facility] OR;  Service: Open Heart Surgery;  Laterality: Right;  . LEFT HEART CATH AND CORONARY ANGIOGRAPHY N/A 07/02/2020   Procedure: LEFT HEART CATH AND CORONARY ANGIOGRAPHY;  Surgeon: Corky Crafts, MD;  Location: [facility] INVASIVE CV LAB;  Service: Cardiovascular;  Laterality: N/A;  . NO PAST SURGERIES    . TEE WITHOUT CARDIOVERSION N/A 07/08/2020   Procedure: TRANSESOPHAGEAL ECHOCARDIOGRAM (TEE);  Surgeon: Delight Ovens, MD;  Location: [facility] OR;  Service: Open Heart Surgery;   Laterality: N/A;    There were no vitals filed for this visit.   Subjective Assessment - 09/15/20 1620    Subjective  Pt denies any pain.    Patient is accompanied by: Family member   wife Albin Felling   Currently in Pain? No/denies            TREATMENT  Supine Foam Exercises - see pt instructions  Hand Gripper: with LUE on level 3 with black spring. Pt picked up 1 inch blocks with gripper with min drops and min difficulty.  Theraputty - see pt instructions  Resistance Clothespins with LUE 1-8# with placing on elevated antenna    Constant Therapy Alternating Symbols level 5 with 95% accuracy and 58.61s response time.              OT Education - 09/15/20 1633    Education Details supine foam exercises for shoulder, red theraputty - see pt instructions    Person(s) Educated Patient    Methods Explanation;Demonstration    Comprehension Need further instruction;Returned demonstration;Verbalized understanding            OT Short Term Goals - 09/15/20 1636      OT SHORT TERM GOAL #1   Title Patient will complete an HEP designed to improve grip strength in left hand    Time 4    Period Weeks    Status On-going  OT SHORT TERM GOAL #2   Title Patient will complete an HEP designed to improve range of motion and decrease pain in left shoulder    Time 4    Period Weeks    Status On-going      OT SHORT TERM GOAL #3   Title Patient will report greater ease in  washing lower legs and donning socks.    Time 4    Period Weeks    Status On-going   pt reports completing but req'd increased time and effort 09/15/20     OT SHORT TERM GOAL #4   Title Patient will report improved ability to get onto and off of lawn mower (management of left leg)    Time 4    Period Weeks    Status New             OT Long Term Goals - 09/09/20 1814      OT LONG TERM GOAL #1   Title Patient will complete an updated HEP to address LUE functioning    Time 8    Period Weeks     Status New      OT LONG TERM GOAL #2   Title Patient will demonstrate 5 lb increase in left grip strength to improve functional grasp    Time 4    Period Weeks    Status New      OT LONG TERM GOAL #3   Title Patient will reach overhead to obtain and place an object no heavier than 2 lbs x 3 without increased pain.    Time 8    Period Weeks    Status New      OT LONG TERM GOAL #4   Title Patient and wife will demonstrate understanding of return to driving recommendations    Time 8    Period Weeks    Status New      OT LONG TERM GOAL #5   Title Patient and wife will demonstrate understanding of return to work recommendations    Time 8    Period Weeks    Status New                 Plan - 09/15/20 1622    Clinical Impression Statement Pt is progressing towards goals. Pt agreeable to goals set at evaluation.    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 ADL;Coordination;Endurance;GMC;Muscle spasms;UE functional use;Vestibular;Decreased knowledge of precautions;Balance;Body mechanics;Decreased knowledge of use of DME;Flexibility;IADL;Pain;Strength;FMC;Dexterity;Cardiopulmonary status limiting activity;Mobility;ROM;Tone    Cognitive Skills Attention;Emotional;Energy/Drive;Learn;Memory;Temperament/Personality;Sequencing;Safety Awareness;Problem Solve;Perception    Rehab Potential Good    Clinical Decision Making Several treatment options, min-mod task modification necessary    Comorbidities Affecting Occupational Performance: May have comorbidities impacting occupational performance    Modification or Assistance to Complete Evaluation  Min-Moderate modification of tasks or assist with  assess necessary to complete eval    OT Frequency 2x / week    OT Duration 8 weeks    OT Treatment/Interventions Self-care/ADL training;Electrical Stimulation;Therapeutic exercise;Patient/family education;Splinting;Neuromuscular education;Paraffin;Moist Heat;Aquatic Therapy;Fluidtherapy;Energy conservation;Building services engineer;Therapeutic activities;Balance training;Cryotherapy;Ultrasound;Contrast Bath;DME and/or AE instruction;Manual Therapy;Passive range of motion;Cognitive remediation/compensation    Plan Address shoulder pain- supine, gentle mobilization and AAROM    Consulted and Agree with Plan of Care Patient;Family member/caregiver  Family Member Consulted Wife Carla           Patient will benefit from skilled therapeutic intervention in order to improve the following deficits and impairments:   Body Structure / Function / Physical Skills: ADL,Coordination,Endurance,GMC,Muscle spasms,UE functional use,Vestibular,Decreased knowledge of precautions,Balance,Body mechanics,Decreased knowledge of use of DME,Flexibility,IADL,Pain,Strength,FMC,Dexterity,Cardiopulmonary status limiting activity,Mobility,ROM,Tone Cognitive Skills: Attention,Emotional,Energy/Drive,Learn,Memory,Temperament/Personality,Sequencing,Safety Awareness,Problem Solve,Perception     Visit Diagnosis: Hemiplegia and hemiparesis following cerebral infarction affecting left non-dominant side (HCC)  Acute pain of left shoulder  Muscle weakness (generalized)  Attention and concentration deficit  Unsteadiness on feet    Problem List Patient Active Problem List   Diagnosis Date Noted  . Orthostatic syncope 08/06/2020  . CVA (cerebral vascular accident) (HCC) 07/21/2020  . Acute CVA (cerebrovascular accident) (HCC) 07/20/2020  . Glaucoma   . Hypertension   . S/P CABG x 4   . Coronary artery disease 07/08/2020  . NSTEMI (non-ST elevated myocardial infarction) (HCC) 07/02/2020  . Hypertensive emergency  07/01/2020    Junious Dresser MOT, OTR/L  09/15/2020, 6:28 PM  [facility] [address] [place], [place], [address] Phone: [phone]   Fax:  [phone]  Name: Leonard Donovan MRN: [id number] Date of Birth: 1965/10/05

## 2020-09-15 NOTE — Therapy (Signed)
[facility] [address] [place], [place], [address] Phone: [phone]   Fax:  [phone]  Speech Language Pathology Treatment  Patient Details  Name: Leonard Donovan MRN: [id number] Date of Birth: 1966-03-15 Referring Provider (SLP): Ihor Austin NP   Encounter Date: 09/15/2020   End of Session - 09/15/20 1755    Visit Number 2    Number of Visits 17    Date for SLP Re-Evaluation 12/08/20    Authorization Type BCBS    SLP Start Time 1702    SLP Stop Time  1743    SLP Time Calculation (min) 41 min    Activity Tolerance Patient tolerated treatment well           Past Medical History:  Diagnosis Date  . CAD (coronary artery disease) of bypass graft    CABG x 4  . Glaucoma   . H/O: CVA (cerebrovascular accident) 2022  . Hyperlipidemia LDL goal <70   . Hypertension     Past Surgical History:  Procedure Laterality Date  . CORONARY ARTERY BYPASS GRAFT N/A 07/08/2020   Procedure: CORONARY ARTERY BYPASS GRAFTING (CABG), ON PUMP, TIMES FOUR, LEFT INTERNAL MAMMARY ARTERY TO LAD, RIGHT SVG TO PDA, DISTAL CIRCUMFLEX, AND OM1;  Surgeon: Delight Ovens, MD;  Location: [facility] OR;  Service: Open Heart Surgery;  Laterality: N/A;  . ENDOVEIN HARVEST OF GREATER SAPHENOUS VEIN Right 07/08/2020   Procedure: ENDOVEIN HARVEST OF RIGHT GREATER SAPHENOUS VEIN;  Surgeon: Delight Ovens, MD;  Location: [facility] OR;  Service: Open Heart Surgery;  Laterality: Right;  . LEFT HEART CATH AND CORONARY ANGIOGRAPHY N/A 07/02/2020   Procedure: LEFT HEART CATH AND CORONARY ANGIOGRAPHY;  Surgeon: Corky Crafts, MD;  Location: [facility] INVASIVE CV LAB;  Service: Cardiovascular;  Laterality: N/A;  . NO PAST SURGERIES    . TEE WITHOUT CARDIOVERSION N/A 07/08/2020   Procedure: TRANSESOPHAGEAL ECHOCARDIOGRAM (TEE);  Surgeon: Delight Ovens, MD;  Location: [facility] OR;  Service: Open Heart Surgery;  Laterality: N/A;    There were no vitals filed for this  visit.   Subjective Assessment - 09/15/20 1711    Subjective "fine"    Currently in Pain? Yes    Pain Score 4     Pain Location Shoulder    Pain Orientation Left    Pain Descriptors / Indicators Aching    Pain Onset 1 to 4 weeks ago    Pain Frequency Constant                 ADULT SLP TREATMENT - 09/15/20 1711      General Information   Behavior/Cognition Alert;Cooperative      Treatment Provided   Treatment provided Cognitive-Linquistic;Dysphagia      Dysphagia Treatment   Treatment Methods Patient/caregiver education    Other treatment/comments SLP reviewed recommended compensatory swallow strategies as recommended during MBSS, including slow rate, reducing distractions, brief oral hold as needed and avoiding straws. SLP recommended patient put handout on kitchen table to cue himself to follow strategies. Pt endorsed he got "choked" on a drink at [employer] dinner due to spontaneous laughter. Of note, pt did recall he noticed himself drooling out of left side of mouth earlier today while sitting.      Cognitive-Linquistic Treatment   Treatment focused on Dysarthria;Aphasia;Cognition;Patient/family/caregiver education    Skilled Treatment SLP assessed awareness of current deficits, in which pt noted with slightly increased awareness of attention and speech deficits. Pt did not recall HEP handout provided  during evaluation. SLP reviewed attention strategies to trial at home, in which pt verbalized difficulty with 5 components. Pt demo'd adequate speech intelligibilty with occasional dysnomia exhibited (ex: "juicy" for smoothie which pt able to correct after delay). SLP targeted auditory word finding task, with additional process required for 5/24 opportunities. SLP targeted verbal output of mental manipulation math with presence of background music, in which pt able to self-correct answers x3 with pt aware of diversions of focused attention x7 with rare min A.      Assessment /  Recommendations / Plan   Plan Continue with current plan of care      Progression Toward Goals   Progression toward goals Progressing toward goals            SLP Education - 09/15/20 1754    Education Details attention compensations, word finding compensations    Person(s) Educated Patient    Methods Explanation;Demonstration;Handout;Verbal cues    Comprehension Verbalized understanding;Returned demonstration;Verbal cues required;Need further instruction            SLP Short Term Goals - 09/15/20 1659      SLP SHORT TERM GOAL #1   Title Pt will verbalize functional application of 2 memory/attention strategies for home/work environments with occasional min A over 2 sessions    Time 4    Period Weeks    Status On-going      SLP SHORT TERM GOAL #2   Title Pt will demonstrate speech compensations for 100% intelligibility in 10 simple conversation with rare min A over 2 sessions    Time 4    Period Weeks    Status On-going      SLP SHORT TERM GOAL #3   Title Pt will verbalize and demonstrate recommended swallow strategies in therapy and at home with min A over 2 sessions    Time 4    Period Weeks    Status On-going      SLP SHORT TERM GOAL #4   Title Pt will be able to verbalize three non-physical deficits by giving examples from home or within ST session in 2 sessions    Time 4    Period Weeks    Status On-going            SLP Long Term Goals - 09/15/20 1700      SLP LONG TERM GOAL #1   Title Pt will verbalize functional application of 3 memory/attention strategies for home/work environments with rare min A over 2 sessions    Time 8    Period Weeks    Status On-going      SLP LONG TERM GOAL #2   Title Pt will demonstrate speech compensations for 100% intelligibility in 15 mod complex conversation with rare min A over 2 sessions    Time 8    Period Weeks    Status On-going      SLP LONG TERM GOAL #3   Title Pt and wife will report consistent carryover of  recommended swallow strategies and reduced s/sx of aspiration over 3 sessions    Time 8    Period Weeks    Status On-going      SLP LONG TERM GOAL #4   Title Pt will demonstrate improved insight and awareness of current deficits and impact on his performance with both work and home environments with rare min A over 2 sessions    Time 8    Period Weeks    Status On-going  Plan - 09/15/20 1755    Clinical Impression Statement Leonard Donovan was referred for OPST intervention for cognitive communication and dysphagia s/p CVA in February 2021. Pt stated "I slur a little bit" particularly when fatigued. Occasional reduced speech intelligibility exhibited in conversation; however, pt indicated he "is not a big talker" and required usual prompting to converse. SLP reviewed attention compensations and targeted focused attention in presence of background noise with diversions of attention noted x7. Pt exhibited dysnomia x2 during ST sessions, in which pt reports that happens occasionally. Additional processing time required intermittently to find words in structured speech tasks. SLP reviewed recommended swallow strategies with written visual aid provided to improve attention to strategies at home. Pt would benefit from skilled ST intervention to address mild dysarthria, mild cognitive communication deficits, and mild oral dysphagia to aid patient in return to PLOF and work, reduce communication breakdown, and decrease aspiration risk.    Speech Therapy Frequency 2x / week    Duration 8 weeks   or 17 total visits   Treatment/Interventions Compensatory strategies;Patient/family education;Functional tasks;Cueing hierarchy;Multimodal communcation approach;Cognitive reorganization;Environmental controls;Diet toleration management by SLP;Aspiration precaution training;Compensatory techniques;Internal/external aids;SLP instruction and feedback    Potential to Achieve Goals Fair    Potential Considerations  Cooperation/participation level;Previous level of function    SLP Home Exercise Plan provided    Consulted and Agree with Plan of Care Patient;Family member/caregiver           Patient will benefit from skilled therapeutic intervention in order to improve the following deficits and impairments:   Cognitive communication deficit  Dysarthria and anarthria  Aphasia  Dysphagia, oral phase    Problem List Patient Active Problem List   Diagnosis Date Noted  . Orthostatic syncope 08/06/2020  . CVA (cerebral vascular accident) (HCC) 07/21/2020  . Acute CVA (cerebrovascular accident) (HCC) 07/20/2020  . Glaucoma   . Hypertension   . S/P CABG x 4   . Coronary artery disease 07/08/2020  . NSTEMI (non-ST elevated myocardial infarction) (HCC) 07/02/2020  . Hypertensive emergency 07/01/2020    Janann Colonel, MA CCC-SLP 09/15/2020, 6:05 PM  [facility] [address] [place], [place], [address] Phone: [phone]   Fax:  [phone]   Name: Leonard Donovan MRN: [id number] Date of Birth: 11-Dec-1965

## 2020-09-17 ENCOUNTER — Other Ambulatory Visit: Payer: Self-pay

## 2020-09-17 ENCOUNTER — Ambulatory Visit: Payer: BC Managed Care – PPO | Admitting: Occupational Therapy

## 2020-09-17 ENCOUNTER — Encounter: Payer: Self-pay | Admitting: Occupational Therapy

## 2020-09-17 ENCOUNTER — Ambulatory Visit: Payer: BC Managed Care – PPO

## 2020-09-17 DIAGNOSIS — R41841 Cognitive communication deficit: Secondary | ICD-10-CM

## 2020-09-17 DIAGNOSIS — M6281 Muscle weakness (generalized): Secondary | ICD-10-CM

## 2020-09-17 DIAGNOSIS — M25512 Pain in left shoulder: Secondary | ICD-10-CM

## 2020-09-17 DIAGNOSIS — R2681 Unsteadiness on feet: Secondary | ICD-10-CM

## 2020-09-17 DIAGNOSIS — I69354 Hemiplegia and hemiparesis following cerebral infarction affecting left non-dominant side: Secondary | ICD-10-CM

## 2020-09-17 DIAGNOSIS — R41840 Attention and concentration deficit: Secondary | ICD-10-CM

## 2020-09-17 DIAGNOSIS — R471 Dysarthria and anarthria: Secondary | ICD-10-CM

## 2020-09-17 MED ORDER — AMLODIPINE BESYLATE 5 MG PO TABS: 5.0000 mg | ORAL_TABLET | Freq: Every day | ORAL | 3 refills | Status: DC

## 2020-09-17 NOTE — Telephone Encounter (Signed)
Add amlodipine 5 mg daily and follow blood pressure. Svetlana Bagby  

## 2020-09-17 NOTE — Telephone Encounter (Signed)
Spoke with pt wife, she reports that his bp is running 170-180/100-110. She also reports the patient is not feeling well since the increase in the metoprolol to 50 mg daily. He is taking it at bedtime. She also reports insomnia since discharge from the hospital, the patient is only getting 3 hours of sleep and then will be up because he can not sleep. Will forward for dr Jens Som review

## 2020-09-17 NOTE — Therapy (Signed)
[facility] [address] [place], [place], [address] Phone: [phone]   Fax:  [phone]  Occupational Therapy Treatment  Patient Details  Name: Leonard Donovan MRN: [id number] Date of Birth: 1965/10/27 Referring Provider (OT): Ihor Austin   Encounter Date: 09/17/2020   OT End of Session - 09/17/20 1853    Visit Number 3    Number of Visits 17    Date for OT Re-Evaluation 11/23/20    Authorization Type BCBS:  [id number]    OT Start Time 1702    OT Stop Time 1745    OT Time Calculation (min) 43 min    Activity Tolerance Patient tolerated treatment well    Behavior During Therapy [facility] for tasks assessed/performed           Past Medical History:  Diagnosis Date  . CAD (coronary artery disease) of bypass graft    CABG x 4  . Glaucoma   . H/O: CVA (cerebrovascular accident) 2022  . Hyperlipidemia LDL goal <70   . Hypertension     Past Surgical History:  Procedure Laterality Date  . CORONARY ARTERY BYPASS GRAFT N/A 07/08/2020   Procedure: CORONARY ARTERY BYPASS GRAFTING (CABG), ON PUMP, TIMES FOUR, LEFT INTERNAL MAMMARY ARTERY TO LAD, RIGHT SVG TO PDA, DISTAL CIRCUMFLEX, AND OM1;  Surgeon: Delight Ovens, MD;  Location: [facility] OR;  Service: Open Heart Surgery;  Laterality: N/A;  . ENDOVEIN HARVEST OF GREATER SAPHENOUS VEIN Right 07/08/2020   Procedure: ENDOVEIN HARVEST OF RIGHT GREATER SAPHENOUS VEIN;  Surgeon: Delight Ovens, MD;  Location: [facility] OR;  Service: Open Heart Surgery;  Laterality: Right;  . LEFT HEART CATH AND CORONARY ANGIOGRAPHY N/A 07/02/2020   Procedure: LEFT HEART CATH AND CORONARY ANGIOGRAPHY;  Surgeon: Corky Crafts, MD;  Location: [facility] INVASIVE CV LAB;  Service: Cardiovascular;  Laterality: N/A;  . NO PAST SURGERIES    . TEE WITHOUT CARDIOVERSION N/A 07/08/2020   Procedure: TRANSESOPHAGEAL ECHOCARDIOGRAM (TEE);  Surgeon: Delight Ovens, MD;  Location: [facility] OR;  Service: Open Heart Surgery;   Laterality: N/A;    There were no vitals filed for this visit.   Subjective Assessment - 09/17/20 1706    Subjective  Pain in left shoulder    Patient is accompanied by: Family member   wife Albin Felling   Currently in Pain? Yes    Pain Score 2     Pain Location Shoulder    Pain Orientation Left    Pain Descriptors / Indicators Aching    Pain Type Chronic pain    Pain Onset 1 to 4 weeks ago    Pain Frequency Constant    Aggravating Factors  Lying down in bed    Pain Relieving Factors tylenol, ice                        OT Treatments/Exercises (OP) - 09/17/20 0001      ADLs   Functional Mobility Patient reports continued shoulder pain, although is reporting less pain each session. Patient reports pain is most significant at night.  Reviewed sleeping positions to encourage better alignment of head/neck/shoulders whether sleeping on back, on left side, or on right side.  Patient without discomfort with any of these adapted positions.    ADL Comments Patient reports difficulty working with resistance bands in cardiac rehab.  Patient instructed to avoid any repeated resistance wotrk for left arm until pain resolves and movement improves.  Modalities   Modalities Moist Heat      Moist Heat Therapy   Number Minutes Moist Heat 10 Minutes    Moist Heat Location Shoulder      Manual Therapy   Manual Therapy Scapular mobilization    Scapular Mobilization Gentle scapular mobilizations to improve scap depression (elevation painful- consistently) and adduction/depression.  Patient very guarded, and has limitations in rib cage mobility after recent cardiac surgery.  Worked on gentle trunk lateral flexion/extension, and rotation to improve rib cage movement.  Suspect rib cae immobility is contributing to shoulder pain.                  OT Education - 09/17/20 1852    Education Details Only gentle motion for LUE at this time - no repetitive resistance training until pain  resolves    Person(s) Educated Patient;Spouse    Methods Explanation    Comprehension Verbalized understanding            OT Short Term Goals - 09/17/20 1854      OT SHORT TERM GOAL #1   Title Patient will complete an HEP designed to improve grip strength in left hand    Time 4    Period Weeks    Status On-going      OT SHORT TERM GOAL #2   Title Patient will complete an HEP designed to improve range of motion and decrease pain in left shoulder    Time 4    Period Weeks    Status On-going      OT SHORT TERM GOAL #3   Title Patient will report greater ease in  washing lower legs and donning socks.    Time 4    Period Weeks    Status On-going   pt reports completing but req'd increased time and effort 09/15/20     OT SHORT TERM GOAL #4   Title Patient will report improved ability to get onto and off of lawn mower (management of left leg)    Time 4    Period Weeks    Status New             OT Long Term Goals - 09/17/20 1854      OT LONG TERM GOAL #1   Title Patient will complete an updated HEP to address LUE functioning    Time 8    Period Weeks    Status New      OT LONG TERM GOAL #2   Title Patient will demonstrate 5 lb increase in left grip strength to improve functional grasp    Time 4    Period Weeks    Status New      OT LONG TERM GOAL #3   Title Patient will reach overhead to obtain and place an object no heavier than 2 lbs x 3 without increased pain.    Time 8    Period Weeks    Status New      OT LONG TERM GOAL #4   Title Patient and wife will demonstrate understanding of return to driving recommendations    Time 8    Period Weeks    Status New      OT LONG TERM GOAL #5   Title Patient and wife will demonstrate understanding of return to work recommendations    Time 8    Period Weeks    Status New  Plan - 09/17/20 1853    Clinical Impression Statement Pt with significant shoulder pain initially - responding well to  therapy, pain is receding.    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 ADL;Coordination;Endurance;GMC;Muscle spasms;UE functional use;Vestibular;Decreased knowledge of precautions;Balance;Body mechanics;Decreased knowledge of use of DME;Flexibility;IADL;Pain;Strength;FMC;Dexterity;Cardiopulmonary status limiting activity;Mobility;ROM;Tone    Cognitive Skills Attention;Emotional;Energy/Drive;Learn;Memory;Temperament/Personality;Sequencing;Safety Awareness;Problem Solve;Perception    Rehab Potential Good    Clinical Decision Making Several treatment options, min-mod task modification necessary    Comorbidities Affecting Occupational Performance: May have comorbidities impacting occupational performance    Modification or Assistance to Complete Evaluation  Min-Moderate modification of tasks or assist with assess necessary to complete eval    OT Frequency 2x / week    OT Duration 8 weeks    OT Treatment/Interventions Self-care/ADL training;Electrical Stimulation;Therapeutic exercise;Patient/family education;Splinting;Neuromuscular education;Paraffin;Moist Heat;Aquatic Therapy;Fluidtherapy;Energy conservation;Building services engineer;Therapeutic activities;Balance training;Cryotherapy;Ultrasound;Contrast Bath;DME and/or AE instruction;Manual Therapy;Passive range of motion;Cognitive remediation/compensation    Plan Address shoulder pain- supine, gentle mobilization and AAROM    Consulted and Agree with Plan of Care Patient;Family member/caregiver    Family Member Consulted Wife Carla           Patient will benefit from skilled therapeutic intervention in order to improve the following deficits and  impairments:   Body Structure / Function / Physical Skills: ADL,Coordination,Endurance,GMC,Muscle spasms,UE functional use,Vestibular,Decreased knowledge of precautions,Balance,Body mechanics,Decreased knowledge of use of DME,Flexibility,IADL,Pain,Strength,FMC,Dexterity,Cardiopulmonary status limiting activity,Mobility,ROM,Tone Cognitive Skills: Attention,Emotional,Energy/Drive,Learn,Memory,Temperament/Personality,Sequencing,Safety Awareness,Problem Solve,Perception     Visit Diagnosis: Hemiplegia and hemiparesis following cerebral infarction affecting left non-dominant side (HCC)  Acute pain of left shoulder  Muscle weakness (generalized)  Attention and concentration deficit  Unsteadiness on feet    Problem List Patient Active Problem List   Diagnosis Date Noted  . Orthostatic syncope 08/06/2020  . CVA (cerebral vascular accident) (HCC) 07/21/2020  . Acute CVA (cerebrovascular accident) (HCC) 07/20/2020  . Glaucoma   . Hypertension   . S/P CABG x 4   . Coronary artery disease 07/08/2020  . NSTEMI (non-ST elevated myocardial infarction) (HCC) 07/02/2020  . Hypertensive emergency 07/01/2020    Collier Salina, OTR/L 09/17/2020, 6:55 PM  [facility] [address] [place], [place], [address] Phone: [phone]   Fax:  [phone]  Name: Leonard Donovan MRN: [id number] Date of Birth: 1966-03-25

## 2020-09-17 NOTE — Telephone Encounter (Signed)
Spoke with pt wife, Aware of dr Ludwig Clarks recommendations. New script sent to the pharmacy. Patient will try melatonin OTC for help sleeping or contact his medical doctor.

## 2020-09-17 NOTE — Therapy (Signed)
[facility] [address] [place], [place], [address] Phone: [phone]   Fax:  [phone]  Speech Language Pathology Treatment  Patient Details  Name: Leonard Donovan MRN: [id number] Date of Birth: 08-Oct-1965 Referring Provider (SLP): Ihor Austin NP   Encounter Date: 09/17/2020   End of Session - 09/17/20 1828    Visit Number 3    Number of Visits 17    Date for SLP Re-Evaluation 12/08/20    Authorization Type BCBS    SLP Start Time 1747    SLP Stop Time  1830    SLP Time Calculation (min) 43 min    Activity Tolerance Patient tolerated treatment well           Past Medical History:  Diagnosis Date  . CAD (coronary artery disease) of bypass graft    CABG x 4  . Glaucoma   . H/O: CVA (cerebrovascular accident) 2022  . Hyperlipidemia LDL goal <70   . Hypertension     Past Surgical History:  Procedure Laterality Date  . CORONARY ARTERY BYPASS GRAFT N/A 07/08/2020   Procedure: CORONARY ARTERY BYPASS GRAFTING (CABG), ON PUMP, TIMES FOUR, LEFT INTERNAL MAMMARY ARTERY TO LAD, RIGHT SVG TO PDA, DISTAL CIRCUMFLEX, AND OM1;  Surgeon: Delight Ovens, MD;  Location: [facility] OR;  Service: Open Heart Surgery;  Laterality: N/A;  . ENDOVEIN HARVEST OF GREATER SAPHENOUS VEIN Right 07/08/2020   Procedure: ENDOVEIN HARVEST OF RIGHT GREATER SAPHENOUS VEIN;  Surgeon: Delight Ovens, MD;  Location: [facility] OR;  Service: Open Heart Surgery;  Laterality: Right;  . LEFT HEART CATH AND CORONARY ANGIOGRAPHY N/A 07/02/2020   Procedure: LEFT HEART CATH AND CORONARY ANGIOGRAPHY;  Surgeon: Corky Crafts, MD;  Location: [facility] INVASIVE CV LAB;  Service: Cardiovascular;  Laterality: N/A;  . NO PAST SURGERIES    . TEE WITHOUT CARDIOVERSION N/A 07/08/2020   Procedure: TRANSESOPHAGEAL ECHOCARDIOGRAM (TEE);  Surgeon: Delight Ovens, MD;  Location: [facility] OR;  Service: Open Heart Surgery;  Laterality: N/A;    There were no vitals filed for this  visit.   Subjective Assessment - 09/17/20 1748    Subjective "eye contact"    Currently in Pain? No/denies                 ADULT SLP TREATMENT - 09/17/20 1747      General Information   Behavior/Cognition Alert;Cooperative      Treatment Provided   Treatment provided Cognitive-Linquistic      Cognitive-Linquistic Treatment   Treatment focused on Dysarthria;Aphasia;Cognition;Patient/family/caregiver education    Skilled Treatment Pt verbalized he was supposed to work on "eye contact" per last ST session. SLP reviewed completed tasks last session with his wife, who is present this session. SLP introduced dysarthria compensations with further practice to be completed in upcoming sessions. SLP targeted alternating attention task for calculations and word find. Cue x1 required to re-direct patient to alternating tasks. Pt distracted by environmental factors x3 during tasks. SLP targeted attention to detail task with background noise present, in which pt completed with 70% of task within 5 minutes. Pt continues to present with some reduced awareness of deficits, requiring usual SLP prompting. Pt verbally able to ID errors with min A. Overall improved attention exhibited, with occasional fading to rare laughing outburts which pt reports he is now able to control with some accuracy.      Assessment / Recommendations / Plan   Plan Continue with current plan of care  Progression Toward Goals   Progression toward goals Progressing toward goals            SLP Education - 09/17/20 1813    Education Details dysarthria strategies, attention strategies, cognitive changes s/p CVA    Person(s) Educated Patient;Spouse    Methods Explanation;Demonstration;Handout    Comprehension Verbalized understanding;Returned demonstration;Need further instruction            SLP Short Term Goals - 09/17/20 1812      SLP SHORT TERM GOAL #1   Title Pt will verbalize functional application of 2  memory/attention strategies for home/work environments with occasional min A over 2 sessions    Time 4    Period Weeks    Status On-going      SLP SHORT TERM GOAL #2   Title Pt will demonstrate speech compensations for 100% intelligibility in 10 simple conversation with rare min A over 2 sessions    Time 4    Period Weeks    Status On-going      SLP SHORT TERM GOAL #3   Title Pt will verbalize and demonstrate recommended swallow strategies in therapy and at home with min A over 2 sessions    Time 4    Period Weeks    Status On-going      SLP SHORT TERM GOAL #4   Title Pt will be able to verbalize three non-physical deficits by giving examples from home or within ST session in 2 sessions    Time 4    Period Weeks    Status On-going            SLP Long Term Goals - 09/17/20 1813      SLP LONG TERM GOAL #1   Title Pt will verbalize functional application of 3 memory/attention strategies for home/work environments with rare min A over 2 sessions    Time 8    Period Weeks    Status On-going      SLP LONG TERM GOAL #2   Title Pt will demonstrate speech compensations for 100% intelligibility in 15 mod complex conversation with rare min A over 2 sessions    Time 8    Period Weeks    Status On-going      SLP LONG TERM GOAL #3   Title Pt and wife will report consistent carryover of recommended swallow strategies and reduced s/sx of aspiration over 3 sessions    Time 8    Period Weeks    Status On-going      SLP LONG TERM GOAL #4   Title Pt will demonstrate improved insight and awareness of current deficits and impact on his performance with both work and home environments with rare min A over 2 sessions    Time 8    Period Weeks    Status On-going            Plan - 09/17/20 1816    Clinical Impression Statement Leonard Donovan was referred for OPST intervention for cognitive communication and dysphagia s/p CVA in February 2021. Rare reduced speech intelligibility this session,  with SLP providing education re: dysarthria compensations to trial. Further assessment of carryover of compensations to be completed. SLP reviewed attention compensations and targeted focused and alternating attention in presence of background noise with diversions of attention noted x3. Pt would benefit from skilled ST intervention to address mild dysarthria, mild cognitive communication deficits, and mild oral dysphagia to aid patient in return to PLOF and work, reduce communication breakdown, and  decrease aspiration risk.    Speech Therapy Frequency 2x / week    Duration 8 weeks   or 17 total visits   Treatment/Interventions Compensatory strategies;Patient/family education;Functional tasks;Cueing hierarchy;Multimodal communcation approach;Cognitive reorganization;Environmental controls;Diet toleration management by SLP;Aspiration precaution training;Compensatory techniques;Internal/external aids;SLP instruction and feedback    Potential to Achieve Goals Fair    Potential Considerations Cooperation/participation level;Previous level of function    SLP Home Exercise Plan provided    Consulted and Agree with Plan of Care Patient;Family member/caregiver           Patient will benefit from skilled therapeutic intervention in order to improve the following deficits and impairments:   Cognitive communication deficit  Dysarthria and anarthria    Problem List Patient Active Problem List   Diagnosis Date Noted  . Orthostatic syncope 08/06/2020  . CVA (cerebral vascular accident) (HCC) 07/21/2020  . Acute CVA (cerebrovascular accident) (HCC) 07/20/2020  . Glaucoma   . Hypertension   . S/P CABG x 4   . Coronary artery disease 07/08/2020  . NSTEMI (non-ST elevated myocardial infarction) (HCC) 07/02/2020  . Hypertensive emergency 07/01/2020    Janann Colonel, MA CCC-SLP 09/17/2020, 6:37 PM  [facility] [address] [place], [place], [address] Phone: [phone]   Fax:  [phone]   Name: CECILIA NISHIKAWA MRN: [id number] Date of Birth: December 21, 1965

## 2020-09-17 NOTE — Patient Instructions (Signed)
  SLOW LOUD OVER-ENNUNCIATE PAUSE  PA TA KA  PATA [place]AK[place] P[place]KA  BUTTERCUP  CATERPILLAR  BASEBALLL PLAYER  [place]  [place]  SLOW AND BIG - EXAGGERATE YOUR MOUTH, MAKE EACH CONSONANT  

## 2020-09-17 NOTE — Telephone Encounter (Signed)
Patients wife states that he is taking Losartan 100 mg but patients blood pressure is still elevated 170/110 latest reading. Patients wife would like to know should they be doing something different.

## 2020-09-18 LAB — BASIC METABOLIC PANEL
BUN/Creatinine Ratio: 23 — ABNORMAL HIGH (ref 9–20)
BUN: 23 mg/dL (ref 6–24)
CO2: 20 mmol/L (ref 20–29)
Calcium: 9.2 mg/dL (ref 8.7–10.2)
Chloride: 103 mmol/L (ref 96–106)
Creatinine, Ser: 1.02 mg/dL (ref 0.76–1.27)
Glucose: 103 mg/dL — ABNORMAL HIGH (ref 65–99)
Potassium: 4.5 mmol/L (ref 3.5–5.2)
Sodium: 141 mmol/L (ref 134–144)
eGFR: 88 mL/min/{1.73_m2} (ref >59)

## 2020-09-21 ENCOUNTER — Encounter: Payer: Self-pay | Admitting: *Deleted

## 2020-09-21 ENCOUNTER — Other Ambulatory Visit: Payer: Self-pay

## 2020-09-21 ENCOUNTER — Ambulatory Visit: Payer: BC Managed Care – PPO | Admitting: Occupational Therapy

## 2020-09-21 ENCOUNTER — Encounter: Payer: Self-pay | Admitting: Occupational Therapy

## 2020-09-21 ENCOUNTER — Ambulatory Visit: Payer: BC Managed Care – PPO

## 2020-09-21 DIAGNOSIS — R41841 Cognitive communication deficit: Secondary | ICD-10-CM | POA: Diagnosis not present

## 2020-09-21 DIAGNOSIS — R471 Dysarthria and anarthria: Secondary | ICD-10-CM

## 2020-09-21 DIAGNOSIS — R2681 Unsteadiness on feet: Secondary | ICD-10-CM

## 2020-09-21 DIAGNOSIS — R41840 Attention and concentration deficit: Secondary | ICD-10-CM

## 2020-09-21 DIAGNOSIS — I69354 Hemiplegia and hemiparesis following cerebral infarction affecting left non-dominant side: Secondary | ICD-10-CM

## 2020-09-21 DIAGNOSIS — M6281 Muscle weakness (generalized): Secondary | ICD-10-CM

## 2020-09-21 DIAGNOSIS — R4701 Aphasia: Secondary | ICD-10-CM

## 2020-09-21 DIAGNOSIS — M25512 Pain in left shoulder: Secondary | ICD-10-CM

## 2020-09-21 NOTE — Therapy (Signed)
[facility] [address] [place], [place], [address] Phone: [phone]   Fax:  [phone]  Occupational Therapy Treatment  Patient Details  Name: Leonard Donovan MRN: [id number] Date of Birth: [birth year] Referring Provider (OT): Ihor Austin   Encounter Date: 09/21/2020   OT End of Session - 09/21/20 1614    Visit Number 4    Number of Visits 17    Date for OT Re-Evaluation 11/23/20    Authorization Type BCBS:  [id number]    OT Start Time 1614    OT Stop Time 1700    OT Time Calculation (min) 46 min    Activity Tolerance Patient tolerated treatment well    Behavior During Therapy [facility] for tasks assessed/performed           Past Medical History:  Diagnosis Date  . CAD (coronary artery disease) of bypass graft    CABG x 4  . Glaucoma   . H/O: CVA (cerebrovascular accident) 2022  . Hyperlipidemia LDL goal <70   . Hypertension     Past Surgical History:  Procedure Laterality Date  . CORONARY ARTERY BYPASS GRAFT N/A 07/08/2020   Procedure: CORONARY ARTERY BYPASS GRAFTING (CABG), ON PUMP, TIMES FOUR, LEFT INTERNAL MAMMARY ARTERY TO LAD, RIGHT SVG TO PDA, DISTAL CIRCUMFLEX, AND OM1;  Surgeon: Delight Ovens, MD;  Location: [facility] OR;  Service: Open Heart Surgery;  Laterality: N/A;  . ENDOVEIN HARVEST OF GREATER SAPHENOUS VEIN Right 07/08/2020   Procedure: ENDOVEIN HARVEST OF RIGHT GREATER SAPHENOUS VEIN;  Surgeon: Delight Ovens, MD;  Location: [facility] OR;  Service: Open Heart Surgery;  Laterality: Right;  . LEFT HEART CATH AND CORONARY ANGIOGRAPHY N/A 07/02/2020   Procedure: LEFT HEART CATH AND CORONARY ANGIOGRAPHY;  Surgeon: Corky Crafts, MD;  Location: [facility] INVASIVE CV LAB;  Service: Cardiovascular;  Laterality: N/A;  . NO PAST SURGERIES    . TEE WITHOUT CARDIOVERSION N/A 07/08/2020   Procedure: TRANSESOPHAGEAL ECHOCARDIOGRAM (TEE);  Surgeon: Delight Ovens, MD;  Location: [facility] OR;  Service: Open Heart Surgery;   Laterality: N/A;    There were no vitals filed for this visit.   Subjective Assessment - 09/21/20 1615    Subjective  "Right now good" Pt report shoulder hurting last night.    Currently in Pain? No/denies           Wall Slides  with foam roll for LUE closed chain active ROM x 10 reps Wall Push Ups x 10 Constant Therapy Read a Map level 1 with 90% accuracy and 35.79s response time. Addressing attention. Alternating Alphabetizing Words (uppercase/lowercase) Level 1 86% accuracy and 107.10s response time.  Hand Gripper: with LUE on level 3 with black spring. Pt picked up 1 inch blocks with gripper with mod drops and min difficulty.                        OT Short Term Goals - 09/17/20 1854      OT SHORT TERM GOAL #1   Title Patient will complete an HEP designed to improve grip strength in left hand    Time 4    Period Weeks    Status On-going      OT SHORT TERM GOAL #2   Title Patient will complete an HEP designed to improve range of motion and decrease pain in left shoulder    Time 4    Period Weeks    Status On-going  OT SHORT TERM GOAL #3   Title Patient will report greater ease in  washing lower legs and donning socks.    Time 4    Period Weeks    Status On-going   pt reports completing but req'd increased time and effort 09/15/20     OT SHORT TERM GOAL #4   Title Patient will report improved ability to get onto and off of lawn mower (management of left leg)    Time 4    Period Weeks    Status New             OT Long Term Goals - 09/17/20 1854      OT LONG TERM GOAL #1   Title Patient will complete an updated HEP to address LUE functioning    Time 8    Period Weeks    Status New      OT LONG TERM GOAL #2   Title Patient will demonstrate 5 lb increase in left grip strength to improve functional grasp    Time 4    Period Weeks    Status New      OT LONG TERM GOAL #3   Title Patient will reach overhead to obtain and place an  object no heavier than 2 lbs x 3 without increased pain.    Time 8    Period Weeks    Status New      OT LONG TERM GOAL #4   Title Patient and wife will demonstrate understanding of return to driving recommendations    Time 8    Period Weeks    Status New      OT LONG TERM GOAL #5   Title Patient and wife will demonstrate understanding of return to work recommendations    Time 8    Period Weeks    Status New                 Plan - 09/21/20 1623    Clinical Impression Statement Pt with decreased pain this day but still reporting pain at night.    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 ADL;Coordination;Endurance;GMC;Muscle spasms;UE functional use;Vestibular;Decreased knowledge of precautions;Balance;Body mechanics;Decreased knowledge of use of DME;Flexibility;IADL;Pain;Strength;FMC;Dexterity;Cardiopulmonary status limiting activity;Mobility;ROM;Tone    Cognitive Skills Attention;Emotional;Energy/Drive;Learn;Memory;Temperament/Personality;Sequencing;Safety Awareness;Problem Solve;Perception    Rehab Potential Good    Clinical Decision Making Several treatment options, min-mod task modification necessary    Comorbidities Affecting Occupational Performance: May have comorbidities impacting occupational performance    Modification or Assistance to Complete Evaluation  Min-Moderate modification of tasks or assist with assess necessary to complete eval    OT Frequency 2x / week    OT Duration 8 weeks    OT Treatment/Interventions Self-care/ADL training;Electrical Stimulation;Therapeutic exercise;Patient/family education;Splinting;Neuromuscular education;Paraffin;Moist Heat;Aquatic  Therapy;Fluidtherapy;Energy conservation;Building services engineer;Therapeutic activities;Balance training;Cryotherapy;Ultrasound;Contrast Bath;DME and/or AE instruction;Manual Therapy;Passive range of motion;Cognitive remediation/compensation    Plan continue addressing shoulder pain, grip strengthening    Consulted and Agree with Plan of Care Patient;Family member/caregiver    Family Member Consulted Wife Carla           Patient will benefit from skilled therapeutic intervention in order to improve the following deficits and impairments:   Body Structure / Function / Physical Skills: ADL,Coordination,Endurance,GMC,Muscle spasms,UE functional use,Vestibular,Decreased knowledge of precautions,Balance,Body  mechanics,Decreased knowledge of use of DME,Flexibility,IADL,Pain,Strength,FMC,Dexterity,Cardiopulmonary status limiting activity,Mobility,ROM,Tone Cognitive Skills: Attention,Emotional,Energy/Drive,Learn,Memory,Temperament/Personality,Sequencing,Safety Awareness,Problem Solve,Perception     Visit Diagnosis: Hemiplegia and hemiparesis following cerebral infarction affecting left non-dominant side (HCC)  Acute pain of left shoulder  Muscle weakness (generalized)  Attention and concentration deficit  Unsteadiness on feet    Problem List Patient Active Problem List   Diagnosis Date Noted  . Orthostatic syncope 08/06/2020  . CVA (cerebral vascular accident) (HCC) 07/21/2020  . Acute CVA (cerebrovascular accident) (HCC) 07/20/2020  . Glaucoma   . Hypertension   . S/P CABG x 4   . Coronary artery disease 07/08/2020  . NSTEMI (non-ST elevated myocardial infarction) (HCC) 07/02/2020  . Hypertensive emergency 07/01/2020    Junious Dresser MOT, OTR/L 09/21/2020, 4:47 PM  [facility] [address] [place], [place], [address] Phone: [phone]   Fax:  [phone]  Name: Leonard Donovan MRN: [id number] Date of  Birth: 1966/03/11

## 2020-09-21 NOTE — Therapy (Signed)
[facility] [address] [place], [place], [address] Phone: [phone]   Fax:  [phone]  Speech Language Pathology Treatment  Patient Details  Name: Leonard Donovan MRN: [id number] Date of Birth: 22-May-1966 Referring Provider (SLP): Ihor Austin NP   Encounter Date: 09/21/2020   End of Session - 09/21/20 1744    Visit Number 4    Number of Visits 17    Date for SLP Re-Evaluation 12/08/20    Authorization Type BCBS    SLP Start Time 1700    SLP Stop Time  1744    SLP Time Calculation (min) 44 min    Activity Tolerance Patient tolerated treatment well           Past Medical History:  Diagnosis Date  . CAD (coronary artery disease) of bypass graft    CABG x 4  . Glaucoma   . H/O: CVA (cerebrovascular accident) 2022  . Hyperlipidemia LDL goal <70   . Hypertension     Past Surgical History:  Procedure Laterality Date  . CORONARY ARTERY BYPASS GRAFT N/A 07/08/2020   Procedure: CORONARY ARTERY BYPASS GRAFTING (CABG), ON PUMP, TIMES FOUR, LEFT INTERNAL MAMMARY ARTERY TO LAD, RIGHT SVG TO PDA, DISTAL CIRCUMFLEX, AND OM1;  Surgeon: Delight Ovens, MD;  Location: [facility] OR;  Service: Open Heart Surgery;  Laterality: N/A;  . ENDOVEIN HARVEST OF GREATER SAPHENOUS VEIN Right 07/08/2020   Procedure: ENDOVEIN HARVEST OF RIGHT GREATER SAPHENOUS VEIN;  Surgeon: Delight Ovens, MD;  Location: [facility] OR;  Service: Open Heart Surgery;  Laterality: Right;  . LEFT HEART CATH AND CORONARY ANGIOGRAPHY N/A 07/02/2020   Procedure: LEFT HEART CATH AND CORONARY ANGIOGRAPHY;  Surgeon: Corky Crafts, MD;  Location: [facility] INVASIVE CV LAB;  Service: Cardiovascular;  Laterality: N/A;  . NO PAST SURGERIES    . TEE WITHOUT CARDIOVERSION N/A 07/08/2020   Procedure: TRANSESOPHAGEAL ECHOCARDIOGRAM (TEE);  Surgeon: Delight Ovens, MD;  Location: [facility] OR;  Service: Open Heart Surgery;  Laterality: N/A;    There were no vitals filed for this  visit.   Subjective Assessment - 09/21/20 1730    Subjective "sure did, I double checked"    Currently in Pain? No/denies                 ADULT SLP TREATMENT - 09/21/20 1703      General Information   Behavior/Cognition Alert;Cooperative      Treatment Provided   Treatment provided Cognitive-Linquistic      Cognitive-Linquistic Treatment   Treatment focused on Dysarthria;Aphasia;Cognition;Patient/family/caregiver education    Skilled Treatment SLP reviewed HEP, in which pt verbalized answers with occasional reduced intelligibility and dysnomia x2. Pt was 75% accurate for attention to detail task, despite pt reportedly double checking. Pt noted with deviations in attention x2 throughout session. SLP reviewed dysarthria compensations, in which SLP provided occasional fading to rare verbal cues to increase volume, slow rate of speech, and over-emphasis each word for improved intelligibility for phrase, sentence, and paragraph levels. Pt noted with reduced carryover of speech compensations in conversation, requiring further instruction.      Assessment / Recommendations / Plan   Plan Continue with current plan of care      Progression Toward Goals   Progression toward goals Progressing toward goals            SLP Education - 09/21/20 2008    Education Details dysarthria compensations, functional practice, reading aloud    Person(s) Educated Patient  Methods Explanation;Demonstration;Handout    Comprehension Verbalized understanding;Returned demonstration;Need further instruction            SLP Short Term Goals - 09/21/20 1744      SLP SHORT TERM GOAL #1   Title Pt will verbalize functional application of 2 memory/attention strategies for home/work environments with occasional min A over 2 sessions    Time 3    Period Weeks    Status On-going      SLP SHORT TERM GOAL #2   Title Pt will demonstrate speech compensations for 100% intelligibility in 10 simple  conversation with rare min A over 2 sessions    Time 3    Period Weeks    Status On-going      SLP SHORT TERM GOAL #3   Title Pt will verbalize and demonstrate recommended swallow strategies in therapy and at home with min A over 2 sessions    Time 3    Period Weeks    Status On-going      SLP SHORT TERM GOAL #4   Title Pt will be able to verbalize three non-physical deficits by giving examples from home or within ST session in 2 sessions    Time 3    Period Weeks    Status On-going            SLP Long Term Goals - 09/21/20 1744      SLP LONG TERM GOAL #1   Title Pt will verbalize functional application of 3 memory/attention strategies for home/work environments with rare min A over 2 sessions    Time 7    Period Weeks    Status On-going      SLP LONG TERM GOAL #2   Title Pt will demonstrate speech compensations for 100% intelligibility in 15 mod complex conversation with rare min A over 2 sessions    Time 7    Period Weeks    Status On-going      SLP LONG TERM GOAL #3   Title Pt and wife will report consistent carryover of recommended swallow strategies and reduced s/sx of aspiration over 3 sessions    Time 7    Period Weeks    Status On-going      SLP LONG TERM GOAL #4   Title Pt will demonstrate improved insight and awareness of current deficits and impact on his performance with both work and home environments with rare min A over 2 sessions    Time 7    Period Weeks    Status On-going            Plan - 09/21/20 2010    Clinical Impression Statement Leonard Donovan was referred for OPST intervention to address mild cognitive communication impairment and dysphagia s/p CVA in February 2021. Occasional reduced speech intelligibility this session, with SLP providing education re: dysarthria compensations. Occasional fading to rare min A required to implement dysarthria compensations at phrase, sentence, and paragraph levels. Reduced carryover of compensations noted in  conversation, requiring further SLP training. Pt would benefit from skilled ST intervention to address mild dysarthria, mild cognitive communication deficits, and mild oral dysphagia to aid patient in return to PLOF and work, reduce communication breakdown, and decrease aspiration risk.    Speech Therapy Frequency 2x / week    Duration 8 weeks   or 17 total visits   Treatment/Interventions Compensatory strategies;Patient/family education;Functional tasks;Cueing hierarchy;Multimodal communcation approach;Cognitive reorganization;Environmental controls;Diet toleration management by SLP;Aspiration precaution training;Compensatory techniques;Internal/external aids;SLP instruction and feedback  Potential to Achieve Goals Fair    Potential Considerations Cooperation/participation level;Previous level of function    SLP Home Exercise Plan provided    Consulted and Agree with Plan of Care Patient           Patient will benefit from skilled therapeutic intervention in order to improve the following deficits and impairments:   Cognitive communication deficit  Dysarthria and anarthria  Aphasia    Problem List Patient Active Problem List   Diagnosis Date Noted  . Orthostatic syncope 08/06/2020  . CVA (cerebral vascular accident) (HCC) 07/21/2020  . Acute CVA (cerebrovascular accident) (HCC) 07/20/2020  . Glaucoma   . Hypertension   . S/P CABG x 4   . Coronary artery disease 07/08/2020  . NSTEMI (non-ST elevated myocardial infarction) (HCC) 07/02/2020  . Hypertensive emergency 07/01/2020    Janann Colonel, MA CCC-SLP 09/21/2020, 8:13 PM  [facility] [address] [place], [place], [address] Phone: [phone]   Fax:  [phone]   Name: Leonard Donovan MRN: [id number] Date of Birth: 10-04-1965

## 2020-09-21 NOTE — Telephone Encounter (Signed)
Left message for pt to call, received fax from cardiac rehab at [facility], they report bp running 190-200/110-115. They report the patient is not having any symptoms. Need to confirm medications as they report the patient started his new medication Friday last week.

## 2020-09-21 NOTE — Patient Instructions (Signed)
  Reading aloud 5-10 minutes every day (Bible, recipes, manuals)  Think one notch up on the volume and little more effort. Practice in your conversations at home.

## 2020-09-22 ENCOUNTER — Ambulatory Visit: Payer: BC Managed Care – PPO | Admitting: Occupational Therapy

## 2020-09-22 ENCOUNTER — Encounter: Payer: Self-pay | Admitting: Occupational Therapy

## 2020-09-22 ENCOUNTER — Ambulatory Visit: Payer: BC Managed Care – PPO

## 2020-09-22 DIAGNOSIS — R471 Dysarthria and anarthria: Secondary | ICD-10-CM

## 2020-09-22 DIAGNOSIS — R41840 Attention and concentration deficit: Secondary | ICD-10-CM

## 2020-09-22 DIAGNOSIS — M6281 Muscle weakness (generalized): Secondary | ICD-10-CM

## 2020-09-22 DIAGNOSIS — R2681 Unsteadiness on feet: Secondary | ICD-10-CM

## 2020-09-22 DIAGNOSIS — R41841 Cognitive communication deficit: Secondary | ICD-10-CM

## 2020-09-22 DIAGNOSIS — M25512 Pain in left shoulder: Secondary | ICD-10-CM

## 2020-09-22 DIAGNOSIS — R4701 Aphasia: Secondary | ICD-10-CM

## 2020-09-22 DIAGNOSIS — I69354 Hemiplegia and hemiparesis following cerebral infarction affecting left non-dominant side: Secondary | ICD-10-CM

## 2020-09-22 NOTE — Therapy (Signed)
[facility] [address] [place], [place], [address] Phone: [phone]   Fax:  [phone]  Occupational Therapy Treatment  Patient Details  Name: Leonard Donovan MRN: [id number] Date of Birth: [birth year] Referring Provider (OT): Ihor Austin   Encounter Date: 09/22/2020   OT End of Session - 09/22/20 1702    Visit Number 5    Number of Visits 17    Date for OT Re-Evaluation 11/23/20    Authorization Type BCBS:  [id number]    OT Start Time 1701    OT Stop Time 1739    OT Time Calculation (min) 38 min    Activity Tolerance Patient tolerated treatment well    Behavior During Therapy [facility] for tasks assessed/performed           Past Medical History:  Diagnosis Date  . CAD (coronary artery disease) of bypass graft    CABG x 4  . Glaucoma   . H/O: CVA (cerebrovascular accident) 2022  . Hyperlipidemia LDL goal <70   . Hypertension     Past Surgical History:  Procedure Laterality Date  . CORONARY ARTERY BYPASS GRAFT N/A 07/08/2020   Procedure: CORONARY ARTERY BYPASS GRAFTING (CABG), ON PUMP, TIMES FOUR, LEFT INTERNAL MAMMARY ARTERY TO LAD, RIGHT SVG TO PDA, DISTAL CIRCUMFLEX, AND OM1;  Surgeon: Delight Ovens, MD;  Location: [facility] OR;  Service: Open Heart Surgery;  Laterality: N/A;  . ENDOVEIN HARVEST OF GREATER SAPHENOUS VEIN Right 07/08/2020   Procedure: ENDOVEIN HARVEST OF RIGHT GREATER SAPHENOUS VEIN;  Surgeon: Delight Ovens, MD;  Location: [facility] OR;  Service: Open Heart Surgery;  Laterality: Right;  . LEFT HEART CATH AND CORONARY ANGIOGRAPHY N/A 07/02/2020   Procedure: LEFT HEART CATH AND CORONARY ANGIOGRAPHY;  Surgeon: Corky Crafts, MD;  Location: [facility] INVASIVE CV LAB;  Service: Cardiovascular;  Laterality: N/A;  . NO PAST SURGERIES    . TEE WITHOUT CARDIOVERSION N/A 07/08/2020   Procedure: TRANSESOPHAGEAL ECHOCARDIOGRAM (TEE);  Surgeon: Delight Ovens, MD;  Location: [facility] OR;  Service: Open Heart Surgery;   Laterality: N/A;    There were no vitals filed for this visit.   Subjective Assessment - 09/22/20 1702    Subjective  "it's there"    Currently in Pain? 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 ue ranger w LUE for shoulder flexion/reaching x 10 x 2 sets. Reported pain with horizontal abduction with LUE  Foam roller edge of mat  Shoulder flexion, chest press, horizontal abduction x 10 reps - no report of pain.  Hand Gripper: with LUE on level 3 with black spring. Pt picked up 1 inch blocks with gripper with min drops and min difficulty. Resistance Clothespins with LUE 1-8# with placing on elevated antenna                       OT Short Term Goals - 09/22/20 1703      OT SHORT TERM GOAL #1   Title Patient will complete an HEP designed to improve grip strength in left hand  Time 4    Period Weeks    Status On-going      OT SHORT TERM GOAL #2   Title Patient will complete an HEP designed to improve range of motion and decrease pain in left shoulder    Time 4    Period Weeks    Status On-going      OT SHORT TERM GOAL #3   Title Patient will report greater ease in  washing lower legs and donning socks.    Time 4    Period Weeks    Status Achieved   pt reports greater ease. 09/22/20     OT SHORT TERM GOAL #4   Title Patient will report improved ability to get onto and off of lawn mower (management of left leg)    Time 4    Period Weeks    Status New             OT Long Term Goals - 09/17/20 1854      OT LONG TERM GOAL #1   Title Patient will complete an updated HEP to address LUE functioning    Time 8    Period Weeks    Status New      OT LONG TERM GOAL #2   Title Patient will  demonstrate 5 lb increase in left grip strength to improve functional grasp    Time 4    Period Weeks    Status New      OT LONG TERM GOAL #3   Title Patient will reach overhead to obtain and place an object no heavier than 2 lbs x 3 without increased pain.    Time 8    Period Weeks    Status New      OT LONG TERM GOAL #4   Title Patient and wife will demonstrate understanding of return to driving recommendations    Time 8    Period Weeks    Status New      OT LONG TERM GOAL #5   Title Patient and wife will demonstrate understanding of return to work recommendations    Time 8    Period Weeks    Status New                 Plan - 09/22/20 1733    Clinical Impression Statement Pt progressing towards goals.    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 ADL;Coordination;Endurance;GMC;Muscle spasms;UE functional use;Vestibular;Decreased knowledge of precautions;Balance;Body mechanics;Decreased knowledge of use of DME;Flexibility;IADL;Pain;Strength;FMC;Dexterity;Cardiopulmonary status limiting activity;Mobility;ROM;Tone    Cognitive Skills Attention;Emotional;Energy/Drive;Learn;Memory;Temperament/Personality;Sequencing;Safety Awareness;Problem Solve;Perception    Rehab Potential Good    Clinical Decision Making Several treatment options, min-mod task modification necessary    Comorbidities Affecting Occupational Performance: May have comorbidities impacting occupational performance    Modification or Assistance to Complete Evaluation  Min-Moderate modification of tasks or assist with assess necessary to complete eval    OT Frequency 2x / week    OT Duration 8 weeks    OT  Treatment/Interventions Self-care/ADL training;Electrical Stimulation;Therapeutic exercise;Patient/family education;Splinting;Neuromuscular education;Paraffin;Moist Heat;Aquatic Therapy;Fluidtherapy;Energy conservation;Building services engineer;Therapeutic activities;Balance training;Cryotherapy;Ultrasound;Contrast Bath;DME and/or AE instruction;Manual Therapy;Passive range of motion;Cognitive remediation/compensation    Plan continue addressing shoulder pain, grip strengthening    Consulted and Agree with Plan of Care Patient;Family member/caregiver  Family Member Consulted Wife Carla           Patient will benefit from skilled therapeutic intervention in order to improve the following deficits and impairments:   Body Structure / Function / Physical Skills: ADL,Coordination,Endurance,GMC,Muscle spasms,UE functional use,Vestibular,Decreased knowledge of precautions,Balance,Body mechanics,Decreased knowledge of use of DME,Flexibility,IADL,Pain,Strength,FMC,Dexterity,Cardiopulmonary status limiting activity,Mobility,ROM,Tone Cognitive Skills: Attention,Emotional,Energy/Drive,Learn,Memory,Temperament/Personality,Sequencing,Safety Awareness,Problem Solve,Perception     Visit Diagnosis: Hemiplegia and hemiparesis following cerebral infarction affecting left non-dominant side (HCC)  Acute pain of left shoulder  Muscle weakness (generalized)  Attention and concentration deficit  Unsteadiness on feet    Problem List Patient Active Problem List   Diagnosis Date Noted  . Orthostatic syncope 08/06/2020  . CVA (cerebral vascular accident) (HCC) 07/21/2020  . Acute CVA (cerebrovascular accident) (HCC) 07/20/2020  . Glaucoma   . Hypertension   . S/P CABG x 4   . Coronary artery disease 07/08/2020  . NSTEMI (non-ST elevated myocardial infarction) (HCC) 07/02/2020  . Hypertensive emergency 07/01/2020    Junious Dresser MOT, OTR/L  09/23/2020, 9:50 AM  [facility] [address] [place], [place], [address] Phone: [phone]   Fax:  [phone]  Name: Leonard Donovan MRN: [id number] Date of Birth: 1966/04/03

## 2020-09-22 NOTE — Patient Instructions (Signed)
  Give non-verbal cue (cup ear) if you don't understand Levan in conversation  Remember: you are not "yelling" you are simply raising your volume by "one notch." You should be talking with a little bit more effort and intention.   Finish "seek out words" worksheet

## 2020-09-22 NOTE — Therapy (Signed)
[facility] [address] [place], [place], [address] Phone: [phone]   Fax:  [phone]  Speech Language Pathology Treatment  Patient Details  Name: Leonard Donovan MRN: [id number] Date of Birth: 07-06-1965 Referring Provider (SLP): Ihor Austin NP   Encounter Date: 09/22/2020   End of Session - 09/22/20 1717    Visit Number 5    Number of Visits 17    Date for SLP Re-Evaluation 12/08/20    Authorization Type BCBS    SLP Start Time 1740    SLP Stop Time  1825    SLP Time Calculation (min) 45 min    Activity Tolerance Patient tolerated treatment well           Past Medical History:  Diagnosis Date  . CAD (coronary artery disease) of bypass graft    CABG x 4  . Glaucoma   . H/O: CVA (cerebrovascular accident) 2022  . Hyperlipidemia LDL goal <70   . Hypertension     Past Surgical History:  Procedure Laterality Date  . CORONARY ARTERY BYPASS GRAFT N/A 07/08/2020   Procedure: CORONARY ARTERY BYPASS GRAFTING (CABG), ON PUMP, TIMES FOUR, LEFT INTERNAL MAMMARY ARTERY TO LAD, RIGHT SVG TO PDA, DISTAL CIRCUMFLEX, AND OM1;  Surgeon: Delight Ovens, MD;  Location: [facility] OR;  Service: Open Heart Surgery;  Laterality: N/A;  . ENDOVEIN HARVEST OF GREATER SAPHENOUS VEIN Right 07/08/2020   Procedure: ENDOVEIN HARVEST OF RIGHT GREATER SAPHENOUS VEIN;  Surgeon: Delight Ovens, MD;  Location: [facility] OR;  Service: Open Heart Surgery;  Laterality: Right;  . LEFT HEART CATH AND CORONARY ANGIOGRAPHY N/A 07/02/2020   Procedure: LEFT HEART CATH AND CORONARY ANGIOGRAPHY;  Surgeon: Corky Crafts, MD;  Location: [facility] INVASIVE CV LAB;  Service: Cardiovascular;  Laterality: N/A;  . NO PAST SURGERIES    . TEE WITHOUT CARDIOVERSION N/A 07/08/2020   Procedure: TRANSESOPHAGEAL ECHOCARDIOGRAM (TEE);  Surgeon: Delight Ovens, MD;  Location: [facility] OR;  Service: Open Heart Surgery;  Laterality: N/A;    There were no vitals filed for this  visit.   Subjective Assessment - 09/22/20 1748    Subjective "I'm tired"    Currently in Pain? Yes    Pain Score 4     Pain Location Shoulder    Pain Orientation Left    Pain Descriptors / Indicators Aching;Sore    Pain Onset 1 to 4 weeks ago    Pain Frequency Constant                 ADULT SLP TREATMENT - 09/22/20 1718      General Information   Behavior/Cognition Alert;Cooperative;Pleasant mood;Distractible      Treatment Provided   Treatment provided Cognitive-Linquistic      Cognitive-Linquistic Treatment   Treatment focused on Dysarthria;Aphasia;Cognition;Patient/family/caregiver education    Skilled Treatment Pt exhibited increased distractability this session, with deviations in attention x3 during opening conversation. Pt reported he completed HEP for practicing speech compensations. Pt's wife reportedly inquired why was patient yelling, in which SLP reviewed "turning volume up just one notch" to address mild dysarthria. Feedback from wife suggests reduced awareness and comprehension. SLP targeted "seek word out" for attention task with pt reading words aloud with use of dysarthria compensations. Mild re-phrasing of directions required due to pt exhibiting decreased comprehension. Rare min A required to slow rate for reading single words aloud. Good focused and alternating exhibited during task given environmental distractions and verbal interruptions. SLP targeted use of dysarthria compensations  at conversational level given short structured topics. Usual min verbal and visual cues required to increase volume, slow rate of speech, and over-ennunicate. Overall, deviations x9 in attention noted throughout this session.      Assessment / Recommendations / Plan   Plan Continue with current plan of care      Progression Toward Goals   Progression toward goals Progressing toward goals            SLP Education - 09/22/20 1749    Education Details attention strategies,  dysarthria compensations, functional practice    Person(s) Educated Patient    Methods Explanation;Demonstration;Verbal cues    Comprehension Verbalized understanding;Returned demonstration;Verbal cues required;Need further instruction            SLP Short Term Goals - 09/22/20 1718      SLP SHORT TERM GOAL #1   Title Pt will verbalize functional application of 2 memory/attention strategies for home/work environments with occasional min A over 2 sessions    Time 3    Period Weeks    Status On-going      SLP SHORT TERM GOAL #2   Title Pt will demonstrate speech compensations for 100% intelligibility in 10 simple conversation with rare min A over 2 sessions    Time 3    Period Weeks    Status On-going      SLP SHORT TERM GOAL #3   Title Pt will verbalize and demonstrate recommended swallow strategies in therapy and at home with min A over 2 sessions    Time 3    Period Weeks    Status On-going      SLP SHORT TERM GOAL #4   Title Pt will be able to verbalize three non-physical deficits by giving examples from home or within ST session in 2 sessions    Time 3    Period Weeks    Status On-going            SLP Long Term Goals - 09/22/20 1718      SLP LONG TERM GOAL #1   Title Pt will verbalize functional application of 3 memory/attention strategies for home/work environments with rare min A over 2 sessions    Time 7    Period Weeks    Status On-going      SLP LONG TERM GOAL #2   Title Pt will demonstrate speech compensations for 100% intelligibility in 15 mod complex conversation with rare min A over 2 sessions    Time 7    Period Weeks    Status On-going      SLP LONG TERM GOAL #3   Title Pt and wife will report consistent carryover of recommended swallow strategies and reduced s/sx of aspiration over 3 sessions    Time 7    Period Weeks    Status On-going      SLP LONG TERM GOAL #4   Title Pt will demonstrate improved insight and awareness of current deficits  and impact on his performance with both work and home environments with rare min A over 2 sessions    Time 7    Period Weeks    Status On-going            Plan - 09/22/20 1822    Clinical Impression Statement Leonard Donovan was referred for OPST intervention to address mild cognitive communication impairment and dysphagia s/p CVA in February 2021. Occasional reduced speech intelligibility noted this session, with SLP providing re-education re: dysarthria compensations and usual verbal  and non-verbal cues during conversation to increase volume. Reduced comprehension exhibited this session for structured tasks, requiring SLP repetition and clarification. Usual fluctuations in attention noted this session, which pt attributed to fatigue. Pt would benefit from skilled ST intervention to address mild dysarthria, mild cognitive communication deficits, and mild oral dysphagia to aid patient in return to PLOF and work, reduce communication breakdown, and decrease aspiration risk.    Speech Therapy Frequency 2x / week    Duration 8 weeks   or 17 total visits   Treatment/Interventions Compensatory strategies;Patient/family education;Functional tasks;Cueing hierarchy;Multimodal communcation approach;Cognitive reorganization;Environmental controls;Diet toleration management by SLP;Aspiration precaution training;Compensatory techniques;Internal/external aids;SLP instruction and feedback    Potential to Achieve Goals Fair    Potential Considerations Cooperation/participation level;Previous level of function    SLP Home Exercise Plan provided    Consulted and Agree with Plan of Care Patient           Patient will benefit from skilled therapeutic intervention in order to improve the following deficits and impairments:   Cognitive communication deficit  Dysarthria and anarthria  Aphasia    Problem List Patient Active Problem List   Diagnosis Date Noted  . Orthostatic syncope 08/06/2020  . CVA (cerebral  vascular accident) (HCC) 07/21/2020  . Acute CVA (cerebrovascular accident) (HCC) 07/20/2020  . Glaucoma   . Hypertension   . S/P CABG x 4   . Coronary artery disease 07/08/2020  . NSTEMI (non-ST elevated myocardial infarction) (HCC) 07/02/2020  . Hypertensive emergency 07/01/2020    Janann Colonel, MA CCC-SLP 09/22/2020, 6:28 PM  [facility] [address] [place], [place], [address] Phone: [phone]   Fax:  [phone]   Name: Leonard Donovan MRN: [id number] Date of Birth: 1965/09/08

## 2020-09-29 ENCOUNTER — Other Ambulatory Visit: Payer: Self-pay

## 2020-09-29 ENCOUNTER — Ambulatory Visit: Payer: BC Managed Care – PPO | Attending: Adult Health | Admitting: Occupational Therapy

## 2020-09-29 ENCOUNTER — Encounter: Payer: Self-pay | Admitting: Occupational Therapy

## 2020-09-29 ENCOUNTER — Ambulatory Visit: Payer: BC Managed Care – PPO

## 2020-09-29 DIAGNOSIS — M25512 Pain in left shoulder: Secondary | ICD-10-CM | POA: Insufficient documentation

## 2020-09-29 DIAGNOSIS — M6281 Muscle weakness (generalized): Secondary | ICD-10-CM | POA: Diagnosis present

## 2020-09-29 DIAGNOSIS — R41841 Cognitive communication deficit: Secondary | ICD-10-CM | POA: Insufficient documentation

## 2020-09-29 DIAGNOSIS — R471 Dysarthria and anarthria: Secondary | ICD-10-CM

## 2020-09-29 DIAGNOSIS — I69354 Hemiplegia and hemiparesis following cerebral infarction affecting left non-dominant side: Secondary | ICD-10-CM | POA: Diagnosis not present

## 2020-09-29 DIAGNOSIS — R2681 Unsteadiness on feet: Secondary | ICD-10-CM | POA: Insufficient documentation

## 2020-09-29 DIAGNOSIS — R1311 Dysphagia, oral phase: Secondary | ICD-10-CM | POA: Insufficient documentation

## 2020-09-29 DIAGNOSIS — R41840 Attention and concentration deficit: Secondary | ICD-10-CM | POA: Diagnosis present

## 2020-09-29 DIAGNOSIS — R1310 Dysphagia, unspecified: Secondary | ICD-10-CM | POA: Diagnosis present

## 2020-09-29 DIAGNOSIS — R4701 Aphasia: Secondary | ICD-10-CM | POA: Insufficient documentation

## 2020-09-29 NOTE — Therapy (Signed)
[facility] [address] [place], [place], [address] Phone: [phone]   Fax:  [phone]  Speech Language Pathology Treatment  Patient Details  Name: Leonard Donovan MRN: [id number] Date of Birth: Apr 21, 1966 Referring Provider (SLP): Ihor Austin NP   Encounter Date: 09/29/2020   End of Session - 09/29/20 1704    Visit Number 6    Number of Visits 17    Date for SLP Re-Evaluation 12/08/20    Authorization Type BCBS    SLP Start Time 1700    SLP Stop Time  1745    SLP Time Calculation (min) 45 min    Activity Tolerance Patient tolerated treatment well           Past Medical History:  Diagnosis Date  . CAD (coronary artery disease) of bypass graft    CABG x 4  . Glaucoma   . H/O: CVA (cerebrovascular accident) 2022  . Hyperlipidemia LDL goal <70   . Hypertension     Past Surgical History:  Procedure Laterality Date  . CORONARY ARTERY BYPASS GRAFT N/A 07/08/2020   Procedure: CORONARY ARTERY BYPASS GRAFTING (CABG), ON PUMP, TIMES FOUR, LEFT INTERNAL MAMMARY ARTERY TO LAD, RIGHT SVG TO PDA, DISTAL CIRCUMFLEX, AND OM1;  Surgeon: Delight Ovens, MD;  Location: [facility] OR;  Service: Open Heart Surgery;  Laterality: N/A;  . ENDOVEIN HARVEST OF GREATER SAPHENOUS VEIN Right 07/08/2020   Procedure: ENDOVEIN HARVEST OF RIGHT GREATER SAPHENOUS VEIN;  Surgeon: Delight Ovens, MD;  Location: [facility] OR;  Service: Open Heart Surgery;  Laterality: Right;  . LEFT HEART CATH AND CORONARY ANGIOGRAPHY N/A 07/02/2020   Procedure: LEFT HEART CATH AND CORONARY ANGIOGRAPHY;  Surgeon: Corky Crafts, MD;  Location: [facility] INVASIVE CV LAB;  Service: Cardiovascular;  Laterality: N/A;  . NO PAST SURGERIES    . TEE WITHOUT CARDIOVERSION N/A 07/08/2020   Procedure: TRANSESOPHAGEAL ECHOCARDIOGRAM (TEE);  Surgeon: Delight Ovens, MD;  Location: [facility] OR;  Service: Open Heart Surgery;  Laterality: N/A;    There were no vitals filed for this  visit.   Subjective Assessment - 09/29/20 1703    Subjective "I did it" re: HWK    Currently in Pain? Yes    Pain Score 2     Pain Location Arm                 ADULT SLP TREATMENT - 09/29/20 1704      General Information   Behavior/Cognition Alert;Cooperative;Pleasant mood;Distractible;Lethargic      Treatment Provided   Treatment provided Cognitive-Linquistic;Dysphagia      Dysphagia Treatment   Temperature Spikes Noted No    Respiratory Status Room air    Oral Cavity - Dentition Adequate natural dentition    Treatment Methods Skilled observation;Compensation strategy training;Patient/caregiver education    Patient observed directly with PO's Yes    Type of PO's observed Thin liquids    Feeding Able to feed self    Liquids provided via Cup    Oral Phase Signs & Symptoms Oral holding    Pharyngeal Phase Signs & Symptoms Delayed throat clear    Type of cueing Verbal    Amount of cueing Modified independent    Other treatment/comments Pt reports some "choking" while drinking. With questioning cues, pt reports he drinks quickly via consecutive sips. SLP cued patient to consume small, single sips during PO trials via cup. One delayed throat clear noted. No other overt s/sx of aspiration exhibited this session. SLP  provided written handout with recommended swallow strategies, including slow rate, small, single sips, and avoiding over-filling oral cavity.      Cognitive-Linquistic Treatment   Treatment focused on Dysarthria;Aphasia;Cognition;Patient/family/caregiver education    Skilled Treatment SLP reviewed HEP, in which pt completed with min A from wife. SLP had patient read sentences aloud, with rare min A required to utilize dysarthria strategies to slow rate, increase volume, and over-ennunicate. Pt exhibited improved sustained and selective attention this session, with deviation in attention noted x1. Of note, pt reported awareness of drooling episode x1 while looking at  bird outside, which seems related to attention. SLP targeted naming and comprehension for analogy tasks, in which pt required occasional min A to improve comprehension.      Assessment / Recommendations / Plan   Plan Continue with current plan of care      Progression Toward Goals   Progression toward goals Progressing toward goals            SLP Education - 09/29/20 1718    Education Details swallow strategies, dysarthria compensations, attention recs    Person(s) Educated Patient    Methods Explanation;Demonstration;Verbal cues;Handout    Comprehension Verbalized understanding;Returned demonstration;Verbal cues required;Need further instruction            SLP Short Term Goals - 09/29/20 1719      SLP SHORT TERM GOAL #1   Title Pt will verbalize functional application of 2 memory/attention strategies for home/work environments with occasional min A over 2 sessions    Time 2    Period Weeks    Status On-going      SLP SHORT TERM GOAL #2   Title Pt will demonstrate speech compensations for 100% intelligibility in 10 simple conversation with rare min A over 2 sessions    Time 2    Period Weeks    Status On-going      SLP SHORT TERM GOAL #3   Title Pt will verbalize and demonstrate recommended swallow strategies in therapy and at home with min A over 2 sessions    Baseline 09-29-20    Time 2    Period Weeks    Status On-going      SLP SHORT TERM GOAL #4   Title Pt will be able to verbalize three non-physical deficits by giving examples from home or within ST session in 2 sessions    Baseline 09-29-20    Time 2    Period Weeks    Status On-going            SLP Long Term Goals - 09/29/20 1719      SLP LONG TERM GOAL #1   Title Pt will verbalize functional application of 3 memory/attention strategies for home/work environments with rare min A over 2 sessions    Time 6    Period Weeks    Status On-going      SLP LONG TERM GOAL #2   Title Pt will demonstrate speech  compensations for 100% intelligibility in 15 mod complex conversation with rare min A over 2 sessions    Time 6    Period Weeks    Status On-going      SLP LONG TERM GOAL #3   Title Pt and wife will report consistent carryover of recommended swallow strategies and reduced s/sx of aspiration over 3 sessions    Time 6    Period Weeks    Status On-going      SLP LONG TERM GOAL #4  Title Pt will demonstrate improved insight and awareness of current deficits and impact on his performance with both work and home environments with rare min A over 2 sessions    Time 6    Period Weeks    Status On-going            Plan - 09/29/20 1734    Clinical Impression Statement Leonard Donovan was referred for OPST intervention to address mild cognitive communication impairment and dysphagia s/p CVA in February 2021. Occasional reduced speech intelligibility noted while reading answers on structured tasks, with SLP providing re-education re: dysarthria compensations and occasional verbal cues during tasks to increase volume and over-ennunicate. Reduced comprehension exhibited this session for analogy tasks, requiring occasional SLP repetition and clarification. Rare fluctuations in attention noted this session. Pt would benefit from skilled ST intervention to address mild dysarthria, mild cognitive communication deficits, and mild oral dysphagia to aid patient in return to PLOF and work, reduce communication breakdown, and decrease aspiration risk.    Speech Therapy Frequency 2x / week    Duration 8 weeks   or 17 total visits   Treatment/Interventions Compensatory strategies;Patient/family education;Functional tasks;Cueing hierarchy;Multimodal communcation approach;Cognitive reorganization;Environmental controls;Diet toleration management by SLP;Aspiration precaution training;Compensatory techniques;Internal/external aids;SLP instruction and feedback    Potential to Achieve Goals Fair    Potential Considerations  Cooperation/participation level;Previous level of function    SLP Home Exercise Plan provided    Consulted and Agree with Plan of Care Patient           Patient will benefit from skilled therapeutic intervention in order to improve the following deficits and impairments:   Cognitive communication deficit  Dysarthria and anarthria  Dysphagia, oral phase    Problem List Patient Active Problem List   Diagnosis Date Noted  . Orthostatic syncope 08/06/2020  . CVA (cerebral vascular accident) (HCC) 07/21/2020  . Acute CVA (cerebrovascular accident) (HCC) 07/20/2020  . Glaucoma   . Hypertension   . S/P CABG x 4   . Coronary artery disease 07/08/2020  . NSTEMI (non-ST elevated myocardial infarction) (HCC) 07/02/2020  . Hypertensive emergency 07/01/2020    Janann Colonel, MA CCC-SLP 09/29/2020, 6:37 PM  [facility] [address] [place], [place], [address] Phone: [phone]   Fax:  [phone]   Name: Leonard Donovan MRN: [id number] Date of Birth: 09-Jul-1965

## 2020-09-29 NOTE — Patient Instructions (Addendum)
  Swallow strategies:   Slow down  Drink one sip at time. Do NOT gulp/guzzle.  Take 1-2 bites at a time. Do not over-fill mouth.

## 2020-09-29 NOTE — Telephone Encounter (Signed)
This encounter was created in error - please disregard.

## 2020-09-29 NOTE — Therapy (Signed)
[facility] [address] [place], [place], [address] Phone: [phone]   Fax:  [phone]  Occupational Therapy Treatment  Patient Details  Name: Leonard Donovan MRN: [id number] Date of Birth: 1965/06/24 Referring Provider (OT): Ihor Austin   Encounter Date: 09/29/2020   OT End of Session - 09/29/20 1853    Visit Number 6    Number of Visits 17    Date for OT Re-Evaluation 11/23/20    Authorization Type BCBS:  VL:MN    OT Start Time 1745    OT Stop Time 1830    OT Time Calculation (min) 45 min    Activity Tolerance Patient tolerated treatment well    Behavior During Therapy [facility] for tasks assessed/performed           Past Medical History:  Diagnosis Date  . CAD (coronary artery disease) of bypass graft    CABG x 4  . Glaucoma   . H/O: CVA (cerebrovascular accident) 2022  . Hyperlipidemia LDL goal <70   . Hypertension     Past Surgical History:  Procedure Laterality Date  . CORONARY ARTERY BYPASS GRAFT N/A 07/08/2020   Procedure: CORONARY ARTERY BYPASS GRAFTING (CABG), ON PUMP, TIMES FOUR, LEFT INTERNAL MAMMARY ARTERY TO LAD, RIGHT SVG TO PDA, DISTAL CIRCUMFLEX, AND OM1;  Surgeon: Delight Ovens, MD;  Location: [facility] OR;  Service: Open Heart Surgery;  Laterality: N/A;  . ENDOVEIN HARVEST OF GREATER SAPHENOUS VEIN Right 07/08/2020   Procedure: ENDOVEIN HARVEST OF RIGHT GREATER SAPHENOUS VEIN;  Surgeon: Delight Ovens, MD;  Location: [facility] OR;  Service: Open Heart Surgery;  Laterality: Right;  . LEFT HEART CATH AND CORONARY ANGIOGRAPHY N/A 07/02/2020   Procedure: LEFT HEART CATH AND CORONARY ANGIOGRAPHY;  Surgeon: Corky Crafts, MD;  Location: [facility] INVASIVE CV LAB;  Service: Cardiovascular;  Laterality: N/A;  . NO PAST SURGERIES    . TEE WITHOUT CARDIOVERSION N/A 07/08/2020   Procedure: TRANSESOPHAGEAL ECHOCARDIOGRAM (TEE);  Surgeon: Delight Ovens, MD;  Location: [facility] OR;  Service: Open Heart Surgery;   Laterality: N/A;    There were no vitals filed for this visit.   Subjective Assessment - 09/29/20 1755    Subjective  I lifted a weight in therapy and it really hurt.  I hurt my arm putting on my belt for church on Sunday    Patient is accompanied by: Family member    Currently in Pain? Yes    Pain Score 2     Pain Location Arm    Pain Orientation Left    Pain Descriptors / Indicators Aching;Sore    Pain Type Chronic pain    Pain Onset 1 to 4 weeks ago    Pain Frequency Constant    Aggravating Factors  Movement, resistance    Pain Relieving Factors tylenol, heat or ice                        OT Treatments/Exercises (OP) - 09/29/20 0001      ADLs   LB Dressing Patient with significant pain in shoulder after attempting to thread belt thru loops on Sunday.  Worked on supported extension with elbow flexion and internal rotation as needed for this task.  Worked in pain free ranges, and at slow speed to reduce guarding      Neurological Re-education Exercises   Other Exercises 1 Neuromuscular reeducation to retrain arm and body relationship as pre-reach activity.  Rolling toward left side  to stretch and relax left shoulder, rolling left to right slowly, in pain free ranges.  Patient reports pain relieved with arm supported in sidelying.      Manual Therapy   Manual Therapy Scapular mobilization    Scapular Mobilization Gentle scapular mobilizations to improve scap  adduction/depression.  Patient very guarded, and has limitations in rib cage mobility after recent cardiac surgery.  Worked on gentle trunk movement - flex/ext/rotation and combinations.                    OT Short Term Goals - 09/29/20 1854      OT SHORT TERM GOAL #1   Title Patient will complete an HEP designed to improve grip strength in left hand    Time 4    Period Weeks    Status On-going      OT SHORT TERM GOAL #2   Title Patient will complete an HEP designed to improve range of motion  and decrease pain in left shoulder    Time 4    Period Weeks    Status On-going      OT SHORT TERM GOAL #3   Title Patient will report greater ease in  washing lower legs and donning socks.    Time 4    Period Weeks    Status Achieved   pt reports greater ease. 09/22/20     OT SHORT TERM GOAL #4   Title Patient will report improved ability to get onto and off of lawn mower (management of left leg)    Time 4    Period Weeks    Status On-going             OT Long Term Goals - 09/29/20 1854      OT LONG TERM GOAL #1   Title Patient will complete an updated HEP to address LUE functioning    Time 8    Period Weeks    Status On-going      OT LONG TERM GOAL #2   Title Patient will demonstrate 5 lb increase in left grip strength to improve functional grasp    Time 4    Period Weeks    Status On-going      OT LONG TERM GOAL #3   Title Patient will reach overhead to obtain and place an object no heavier than 2 lbs x 3 without increased pain.    Time 8    Period Weeks    Status On-going      OT LONG TERM GOAL #4   Title Patient and wife will demonstrate understanding of return to driving recommendations    Time 8    Period Weeks    Status On-going      OT LONG TERM GOAL #5   Title Patient and wife will demonstrate understanding of return to work recommendations    Time 8    Period Weeks    Status On-going                 Plan - 09/29/20 1853    Clinical Impression Statement Pt still limited by shoulder/ left arm pain.    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 ADL;Coordination;Endurance;GMC;Muscle spasms;UE functional use;Vestibular;Decreased knowledge of  precautions;Balance;Body mechanics;Decreased  knowledge of use of DME;Flexibility;IADL;Pain;Strength;FMC;Dexterity;Cardiopulmonary status limiting activity;Mobility;ROM;Tone    Cognitive Skills Attention;Emotional;Energy/Drive;Learn;Memory;Temperament/Personality;Sequencing;Safety Awareness;Problem Solve;Perception    Rehab Potential Good    Clinical Decision Making Several treatment options, min-mod task modification necessary    Comorbidities Affecting Occupational Performance: May have comorbidities impacting occupational performance    Modification or Assistance to Complete Evaluation  Min-Moderate modification of tasks or assist with assess necessary to complete eval    OT Frequency 2x / week    OT Duration 8 weeks    OT Treatment/Interventions Self-care/ADL training;Electrical Stimulation;Therapeutic exercise;Patient/family education;Splinting;Neuromuscular education;Paraffin;Moist Heat;Aquatic Therapy;Fluidtherapy;Energy conservation;Building services engineer;Therapeutic activities;Balance training;Cryotherapy;Ultrasound;Contrast Bath;DME and/or AE instruction;Manual Therapy;Passive range of motion;Cognitive remediation/compensation    Plan continue addressing shoulder pain, grip strengthening    Consulted and Agree with Plan of Care Patient;Family member/caregiver    Family Member Consulted Wife Carla           Patient will benefit from skilled therapeutic intervention in order to improve the following deficits and impairments:   Body Structure / Function / Physical Skills: ADL,Coordination,Endurance,GMC,Muscle spasms,UE functional use,Vestibular,Decreased knowledge of precautions,Balance,Body mechanics,Decreased knowledge of use of DME,Flexibility,IADL,Pain,Strength,FMC,Dexterity,Cardiopulmonary status limiting activity,Mobility,ROM,Tone Cognitive Skills: Attention,Emotional,Energy/Drive,Learn,Memory,Temperament/Personality,Sequencing,Safety Awareness,Problem Solve,Perception      Visit Diagnosis: Hemiplegia and hemiparesis following cerebral infarction affecting left non-dominant side (HCC)  Acute pain of left shoulder  Muscle weakness (generalized)  Attention and concentration deficit  Unsteadiness on feet    Problem List Patient Active Problem List   Diagnosis Date Noted  . Orthostatic syncope 08/06/2020  . CVA (cerebral vascular accident) (HCC) 07/21/2020  . Acute CVA (cerebrovascular accident) (HCC) 07/20/2020  . Glaucoma   . Hypertension   . S/P CABG x 4   . Coronary artery disease 07/08/2020  . NSTEMI (non-ST elevated myocardial infarction) (HCC) 07/02/2020  . Hypertensive emergency 07/01/2020    Collier Salina, OTR/L 09/29/2020, 6:55 PM  [facility] [address] [place], [place], [address] Phone: [phone]   Fax:  [phone]  Name: Leonard Donovan MRN: [id number] Date of Birth: 08-May-1966

## 2020-10-01 ENCOUNTER — Ambulatory Visit: Payer: BC Managed Care – PPO

## 2020-10-01 ENCOUNTER — Ambulatory Visit: Payer: BC Managed Care – PPO | Admitting: Occupational Therapy

## 2020-10-01 ENCOUNTER — Other Ambulatory Visit: Payer: Self-pay

## 2020-10-01 ENCOUNTER — Telehealth: Payer: Self-pay | Admitting: *Deleted

## 2020-10-01 ENCOUNTER — Encounter: Payer: Self-pay | Admitting: Occupational Therapy

## 2020-10-01 DIAGNOSIS — M6281 Muscle weakness (generalized): Secondary | ICD-10-CM

## 2020-10-01 DIAGNOSIS — R2681 Unsteadiness on feet: Secondary | ICD-10-CM

## 2020-10-01 DIAGNOSIS — R1311 Dysphagia, oral phase: Secondary | ICD-10-CM

## 2020-10-01 DIAGNOSIS — M25512 Pain in left shoulder: Secondary | ICD-10-CM

## 2020-10-01 DIAGNOSIS — R471 Dysarthria and anarthria: Secondary | ICD-10-CM

## 2020-10-01 DIAGNOSIS — I69354 Hemiplegia and hemiparesis following cerebral infarction affecting left non-dominant side: Secondary | ICD-10-CM

## 2020-10-01 DIAGNOSIS — R4701 Aphasia: Secondary | ICD-10-CM

## 2020-10-01 NOTE — Therapy (Signed)
[facility] [address] [place], [place], [address] Phone: [phone]   Fax:  [phone]  Speech Language Pathology Treatment  Patient Details  Name: Leonard Donovan MRN: [id number] Date of Birth: 04/23/1966 Referring Provider (SLP): Ihor Austin NP   Encounter Date: 10/01/2020   End of Session - 10/01/20 1611    Visit Number 7    Number of Visits 17    Date for SLP Re-Evaluation 12/08/20    Authorization Type BCBS    SLP Start Time 1700    SLP Stop Time  1745    SLP Time Calculation (min) 45 min    Activity Tolerance Patient tolerated treatment well           Past Medical History:  Diagnosis Date  . CAD (coronary artery disease) of bypass graft    CABG x 4  . Glaucoma   . H/O: CVA (cerebrovascular accident) 2022  . Hyperlipidemia LDL goal <70   . Hypertension     Past Surgical History:  Procedure Laterality Date  . CORONARY ARTERY BYPASS GRAFT N/A 07/08/2020   Procedure: CORONARY ARTERY BYPASS GRAFTING (CABG), ON PUMP, TIMES FOUR, LEFT INTERNAL MAMMARY ARTERY TO LAD, RIGHT SVG TO PDA, DISTAL CIRCUMFLEX, AND OM1;  Surgeon: Delight Ovens, MD;  Location: [facility] OR;  Service: Open Heart Surgery;  Laterality: N/A;  . ENDOVEIN HARVEST OF GREATER SAPHENOUS VEIN Right 07/08/2020   Procedure: ENDOVEIN HARVEST OF RIGHT GREATER SAPHENOUS VEIN;  Surgeon: Delight Ovens, MD;  Location: [facility] OR;  Service: Open Heart Surgery;  Laterality: Right;  . LEFT HEART CATH AND CORONARY ANGIOGRAPHY N/A 07/02/2020   Procedure: LEFT HEART CATH AND CORONARY ANGIOGRAPHY;  Surgeon: Corky Crafts, MD;  Location: [facility] INVASIVE CV LAB;  Service: Cardiovascular;  Laterality: N/A;  . NO PAST SURGERIES    . TEE WITHOUT CARDIOVERSION N/A 07/08/2020   Procedure: TRANSESOPHAGEAL ECHOCARDIOGRAM (TEE);  Surgeon: Delight Ovens, MD;  Location: [facility] OR;  Service: Open Heart Surgery;  Laterality: N/A;    There were no vitals filed for this  visit.   Subjective Assessment - 10/01/20 1703    Subjective "pretty good"    Currently in Pain? Yes    Pain Score 2     Pain Location Shoulder                 ADULT SLP TREATMENT - 10/01/20 1611      General Information   Behavior/Cognition Alert;Cooperative;Pleasant mood;Distractible      Treatment Provided   Treatment provided Cognitive-Linquistic;Dysphagia      Dysphagia Treatment   Temperature Spikes Noted No    Respiratory Status Room air    Oral Cavity - Dentition Adequate natural dentition    Treatment Methods Skilled observation;Compensation strategy training;Patient/caregiver education    Patient observed directly with PO's Yes    Type of PO's observed Thin liquids    Feeding Able to feed self    Liquids provided via Cup    Oral Phase Signs & Symptoms Oral holding   prolonged   Type of cueing Verbal    Amount of cueing Modified independent    Other treatment/comments Pt reported "choking" while drinking water from bottle this morning. Pt felt sensation of it "going down the wrong hole." Pt endorses usual prolonged oral holding, in which pt stated "I think..what am I doing? Just swallow it." SLP trialed 3 second bolus hold with mental counting to cue voilitional swallow, which pt reported was effective x5  trials. No overt s/sx of aspiration exhibited this session.      Cognitive-Linquistic Treatment   Treatment focused on Dysarthria;Aphasia;Cognition;Patient/family/caregiver education    Skilled Treatment Pt read aloud HEP with cued use of dysathria compensations. Good accuracy of slow rate and over-ennunication on structured tasks with rare min A. Word finding episode x1 noted in conversation. Occasional word finding exhibited during structured tasks targeting naming in categories and ID synonyms. Pt required occasional min A and usual additional processing time.      Assessment / Recommendations / Plan   Plan Continue with current plan of care      Progression  Toward Goals   Progression toward goals Progressing toward goals              SLP Short Term Goals - 10/01/20 1610      SLP SHORT TERM GOAL #1   Title Pt will verbalize functional application of 2 memory/attention strategies for home/work environments with occasional min A over 2 sessions    Time 2    Period Weeks    Status On-going      SLP SHORT TERM GOAL #2   Title Pt will demonstrate speech compensations for 100% intelligibility in 10 simple conversation with rare min A over 2 sessions    Time 2    Period Weeks    Status On-going      SLP SHORT TERM GOAL #3   Title Pt will verbalize and demonstrate recommended swallow strategies in therapy and at home with min A over 2 sessions    Baseline 09-29-20    Time 2    Period Weeks    Status On-going      SLP SHORT TERM GOAL #4   Title Pt will be able to verbalize three non-physical deficits by giving examples from home or within ST session in 2 sessions    Baseline 09-29-20    Time 2    Period Weeks    Status On-going            SLP Long Term Goals - 10/01/20 1610      SLP LONG TERM GOAL #1   Title Pt will verbalize functional application of 3 memory/attention strategies for home/work environments with rare min A over 2 sessions    Time 6    Period Weeks    Status On-going      SLP LONG TERM GOAL #2   Title Pt will demonstrate speech compensations for 100% intelligibility in 15 mod complex conversation with rare min A over 2 sessions    Time 6    Period Weeks    Status On-going      SLP LONG TERM GOAL #3   Title Pt and wife will report consistent carryover of recommended swallow strategies and reduced s/sx of aspiration over 3 sessions    Time 6    Period Weeks    Status On-going      SLP LONG TERM GOAL #4   Title Pt will demonstrate improved insight and awareness of current deficits and impact on his performance with both work and home environments with rare min A over 2 sessions    Time 6    Period Weeks     Status On-going            Plan - 10/01/20 1728    Clinical Impression Statement Jaquawn was referred for OPST intervention to address mild cognitive communication impairment and dysphagia s/p CVA in February 2021. Improved speech intelligibility noted  while reading answers on structured tasks with cued use of dysarthria compensations to increase volume and over-ennunicate. Occasional word finding episodes exhibited this session, in conversation x1 and on structured tasks. SLP educated word finding compensations to aid anomia. Pt continues to report intermittent coughing with thin liquids, in which SLP trialed 3 second bolus to improve bolus cohesion which appeared effective. Pt would benefit from skilled ST intervention to address mild dysarthria, mild cognitive communication deficits, and mild oral dysphagia to aid patient in return to PLOF and work, reduce communication breakdown, and decrease aspiration risk.    Speech Therapy Frequency 2x / week    Duration 8 weeks   or 17 total visits   Treatment/Interventions Compensatory strategies;Patient/family education;Functional tasks;Cueing hierarchy;Multimodal communcation approach;Cognitive reorganization;Environmental controls;Diet toleration management by SLP;Aspiration precaution training;Compensatory techniques;Internal/external aids;SLP instruction and feedback    Potential to Achieve Goals Fair    Potential Considerations Cooperation/participation level;Previous level of function    SLP Home Exercise Plan provided    Consulted and Agree with Plan of Care Patient           Patient will benefit from skilled therapeutic intervention in order to improve the following deficits and impairments:   Dysarthria and anarthria  Dysphagia, oral phase  Aphasia    Problem List Patient Active Problem List   Diagnosis Date Noted  . Orthostatic syncope 08/06/2020  . CVA (cerebral vascular accident) (HCC) 07/21/2020  . Acute CVA (cerebrovascular  accident) (HCC) 07/20/2020  . Glaucoma   . Hypertension   . S/P CABG x 4   . Coronary artery disease 07/08/2020  . NSTEMI (non-ST elevated myocardial infarction) (HCC) 07/02/2020  . Hypertensive emergency 07/01/2020    Janann Colonel, MA CCC-SLP 10/01/2020, 5:41 PM  [facility] [address] [place], [place], [address] Phone: [phone]   Fax:  [phone]   Name: TYSEAN VANDERVLIET MRN: [id number] Date of Birth: 12-07-1965

## 2020-10-01 NOTE — Patient Instructions (Signed)

## 2020-10-01 NOTE — Telephone Encounter (Signed)
[facility] Disability Form completed, to JM/NP to review, complete, then sign.

## 2020-10-01 NOTE — Therapy (Signed)
[facility] [address] [place], [place], [address] Phone: [phone]   Fax:  [phone]  Occupational Therapy Treatment  Patient Details  Name: Leonard Donovan MRN: [id number] Date of Birth: August 01, 1965 Referring Provider (OT): Ihor Austin   Encounter Date: 10/01/2020   OT End of Session - 10/01/20 1704    Visit Number 7    Number of Visits 17    Date for OT Re-Evaluation 11/23/20    Authorization Type BCBS:  VL:[place]    OT Start Time 1615    OT Stop Time 1700    OT Time Calculation (min) 45 min    Activity Tolerance Patient tolerated treatment well    Behavior During Therapy [facility] for tasks assessed/performed           Past Medical History:  Diagnosis Date  . CAD (coronary artery disease) of bypass graft    CABG x 4  . Glaucoma   . H/O: CVA (cerebrovascular accident) 2022  . Hyperlipidemia LDL goal <70   . Hypertension     Past Surgical History:  Procedure Laterality Date  . CORONARY ARTERY BYPASS GRAFT N/A 07/08/2020   Procedure: CORONARY ARTERY BYPASS GRAFTING (CABG), ON PUMP, TIMES FOUR, LEFT INTERNAL MAMMARY ARTERY TO LAD, RIGHT SVG TO PDA, DISTAL CIRCUMFLEX, AND OM1;  Surgeon: Delight Ovens, MD;  Location: [facility] OR;  Service: Open Heart Surgery;  Laterality: N/A;  . ENDOVEIN HARVEST OF GREATER SAPHENOUS VEIN Right 07/08/2020   Procedure: ENDOVEIN HARVEST OF RIGHT GREATER SAPHENOUS VEIN;  Surgeon: Delight Ovens, MD;  Location: [facility] OR;  Service: Open Heart Surgery;  Laterality: Right;  . LEFT HEART CATH AND CORONARY ANGIOGRAPHY N/A 07/02/2020   Procedure: LEFT HEART CATH AND CORONARY ANGIOGRAPHY;  Surgeon: Corky Crafts, MD;  Location: [facility] INVASIVE CV LAB;  Service: Cardiovascular;  Laterality: N/A;  . NO PAST SURGERIES    . TEE WITHOUT CARDIOVERSION N/A 07/08/2020   Procedure: TRANSESOPHAGEAL ECHOCARDIOGRAM (TEE);  Surgeon: Delight Ovens, MD;  Location: [facility] OR;  Service: Open Heart Surgery;   Laterality: N/A;    There were no vitals filed for this visit.   Subjective Assessment - 10/01/20 1657    Subjective  Gardening - tomatoes - patient indicates it feels better when he uses it    Patient is accompanied by: Family member    Currently in Pain? Yes    Pain Score 3     Pain Location Arm    Pain Orientation Left    Pain Descriptors / Indicators Aching;Sore    Pain Type Chronic pain    Pain Onset 1 to 4 weeks ago    Pain Frequency Constant    Aggravating Factors  resistance    Pain Relieving Factors heat or ice                        OT Treatments/Exercises (OP) - 10/01/20 0001      Neurological Re-education Exercises   Other Exercises 1 NMR to address shoulder movement in conjunction with trunk motion - continuing to address shoulder extension with IR and elbow flexion - using pole for support and gentle stretch.    Other Exercises 2 seated - arms supported on table working on posterior / anterior tilt in seated.  Prone with arms off mat table on floor to address scapular stability - light load thru BUE.  OT Short Term Goals - 10/01/20 1705      OT SHORT TERM GOAL #1   Title Patient will complete an HEP designed to improve grip strength in left hand    Time 4    Period Weeks    Status On-going      OT SHORT TERM GOAL #2   Title Patient will complete an HEP designed to improve range of motion and decrease pain in left shoulder    Time 4    Period Weeks    Status On-going      OT SHORT TERM GOAL #3   Title Patient will report greater ease in  washing lower legs and donning socks.    Time 4    Period Weeks    Status Achieved   pt reports greater ease. 09/22/20     OT SHORT TERM GOAL #4   Title Patient will report improved ability to get onto and off of lawn mower (management of left leg)    Time 4    Period Weeks    Status On-going             OT Long Term Goals - 10/01/20 1705      OT LONG TERM GOAL #1    Title Patient will complete an updated HEP to address LUE functioning    Time 8    Period Weeks    Status On-going      OT LONG TERM GOAL #2   Title Patient will demonstrate 5 lb increase in left grip strength to improve functional grasp    Time 4    Period Weeks    Status On-going      OT LONG TERM GOAL #3   Title Patient will reach overhead to obtain and place an object no heavier than 2 lbs x 3 without increased pain.    Time 8    Period Weeks    Status On-going      OT LONG TERM GOAL #4   Title Patient and wife will demonstrate understanding of return to driving recommendations    Time 8    Period Weeks    Status On-going      OT LONG TERM GOAL #5   Title Patient and wife will demonstrate understanding of return to work recommendations    Time 8    Period Weeks    Status On-going                 Plan - 10/01/20 1704    Clinical Impression Statement Pt with consistent report of dull pain in left shoulder / arm    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 ADL;Coordination;Endurance;GMC;Muscle spasms;UE functional use;Vestibular;Decreased knowledge of precautions;Balance;Body mechanics;Decreased knowledge of use of DME;Flexibility;IADL;Pain;Strength;FMC;Dexterity;Cardiopulmonary status limiting activity;Mobility;ROM;Tone    Cognitive Skills Attention;Emotional;Energy/Drive;Learn;Memory;Temperament/Personality;Sequencing;Safety Awareness;Problem Solve;Perception    Rehab Potential Good    Clinical Decision Making Several treatment options, min-mod task modification necessary    Comorbidities Affecting Occupational Performance: May have comorbidities impacting occupational  performance    Modification or Assistance to Complete Evaluation  Min-Moderate modification of tasks or assist with assess necessary to complete eval    OT Frequency 2x / week    OT Duration 8 weeks    OT Treatment/Interventions Self-care/ADL  training;Electrical Stimulation;Therapeutic exercise;Patient/family education;Splinting;Neuromuscular education;Paraffin;Moist Heat;Aquatic Therapy;Fluidtherapy;Energy conservation;Building services engineer;Therapeutic activities;Balance training;Cryotherapy;Ultrasound;Contrast Bath;DME and/or AE instruction;Manual Therapy;Passive range of motion;Cognitive remediation/compensation    Plan continue addressing shoulder pain, grip strengthening    Consulted and Agree with Plan of Care Patient;Family member/caregiver    Family Member Consulted Wife Carla           Patient will benefit from skilled therapeutic intervention in order to improve the following deficits and impairments:   Body Structure / Function / Physical Skills: ADL,Coordination,Endurance,GMC,Muscle spasms,UE functional use,Vestibular,Decreased knowledge of precautions,Balance,Body mechanics,Decreased knowledge of use of DME,Flexibility,IADL,Pain,Strength,FMC,Dexterity,Cardiopulmonary status limiting activity,Mobility,ROM,Tone Cognitive Skills: Attention,Emotional,Energy/Drive,Learn,Memory,Temperament/Personality,Sequencing,Safety Awareness,Problem Solve,Perception     Visit Diagnosis: Acute pain of left shoulder  Muscle weakness (generalized)  Unsteadiness on feet  Hemiplegia and hemiparesis following cerebral infarction affecting left non-dominant side [facility])    Problem List Patient Active Problem List   Diagnosis Date Noted  . Orthostatic syncope 08/06/2020  . CVA (cerebral vascular accident) (HCC) 07/21/2020  . Acute CVA (cerebrovascular accident) (HCC) 07/20/2020  . Glaucoma   . Hypertension   . S/P CABG x 4   . Coronary artery disease 07/08/2020  . NSTEMI (non-ST  elevated myocardial infarction) (HCC) 07/02/2020  . Hypertensive emergency 07/01/2020    Collier Salina, OTR/L 10/01/2020, 5:06 PM  [facility] [address] [place], [place], [address] Phone: [phone]   Fax:  [phone]  Name: Leonard Donovan MRN: [id number] Date of Birth: [birth year]

## 2020-10-02 ENCOUNTER — Telehealth: Payer: Self-pay | Admitting: Cardiology

## 2020-10-02 NOTE — Telephone Encounter (Signed)
Spoke with pt's wife per rehab  pt's B/P is still running 150/100's  per wife the lowest has been 99 and for the last few days pt's heart rate has been running high 40's to low 50's Pt taking Metoprolol 50 mg ,Losartan 100 mg every day and Amlodipine 5 mg every day Will forward to Dr Jens Som for review and recommendations

## 2020-10-02 NOTE — Telephone Encounter (Signed)
Patient's wife stated she was returning a phone call from the nurse

## 2020-10-05 MED ORDER — METOPROLOL SUCCINATE ER 25 MG PO TB24: 25.0000 mg | ORAL_TABLET | Freq: Every day | ORAL | 3 refills | Status: DC

## 2020-10-05 MED ORDER — AMLODIPINE BESYLATE 10 MG PO TABS: 10.0000 mg | ORAL_TABLET | Freq: Every day | ORAL | 3 refills | Status: DC

## 2020-10-05 NOTE — Telephone Encounter (Signed)
Spoke with pt wife, Aware of dr Ludwig Clarks recommendations. She voiced understanding but is actually more worried by his heart rate being low. She reports he feels tired and weak when his heart rate is in the 40's. He takes the metoprolol in the morning with the amlodipine and the losartan in the evening. Will forward for dr Jens Som review

## 2020-10-05 NOTE — Telephone Encounter (Signed)
Decrease toprol to 25 mg daily and follow HR.   [name]  10/05/2020,11:15 AM   Spoke with pt wife, Aware of dr Ludwig Clarks recommendations.

## 2020-10-06 ENCOUNTER — Other Ambulatory Visit: Payer: Self-pay

## 2020-10-06 ENCOUNTER — Ambulatory Visit: Payer: BC Managed Care – PPO | Admitting: Occupational Therapy

## 2020-10-06 ENCOUNTER — Ambulatory Visit: Payer: BC Managed Care – PPO

## 2020-10-06 ENCOUNTER — Encounter: Payer: Self-pay | Admitting: Occupational Therapy

## 2020-10-06 DIAGNOSIS — I69354 Hemiplegia and hemiparesis following cerebral infarction affecting left non-dominant side: Secondary | ICD-10-CM

## 2020-10-06 DIAGNOSIS — R41840 Attention and concentration deficit: Secondary | ICD-10-CM

## 2020-10-06 DIAGNOSIS — M6281 Muscle weakness (generalized): Secondary | ICD-10-CM

## 2020-10-06 DIAGNOSIS — R41841 Cognitive communication deficit: Secondary | ICD-10-CM

## 2020-10-06 DIAGNOSIS — R471 Dysarthria and anarthria: Secondary | ICD-10-CM

## 2020-10-06 DIAGNOSIS — R2681 Unsteadiness on feet: Secondary | ICD-10-CM

## 2020-10-06 DIAGNOSIS — M25512 Pain in left shoulder: Secondary | ICD-10-CM

## 2020-10-06 NOTE — Telephone Encounter (Signed)
To medical records.

## 2020-10-06 NOTE — Therapy (Signed)
[facility] [address] [place], [place], [address] Phone: [phone]   Fax:  [phone]  Occupational Therapy Treatment  Patient Details  Name: Leonard Donovan MRN: [id number] Date of Birth: 1966/03/09 Referring Provider (OT): Frann Rider   Encounter Date: 10/06/2020   OT End of Session - 10/06/20 1837    Visit Number 8    Number of Visits 17    Date for OT Re-Evaluation 11/23/20    Authorization Type BCBS:  VL:MN    OT Start Time 0347    OT Stop Time 1830    OT Time Calculation (min) 45 min    Activity Tolerance Patient tolerated treatment well    Behavior During Therapy [facility] for tasks assessed/performed           Past Medical History:  Diagnosis Date  . CAD (coronary artery disease) of bypass graft    CABG x 4  . Glaucoma   . H/O: CVA (cerebrovascular accident) 2022  . Hyperlipidemia LDL goal <70   . Hypertension     Past Surgical History:  Procedure Laterality Date  . CORONARY ARTERY BYPASS GRAFT N/A 07/08/2020   Procedure: CORONARY ARTERY BYPASS GRAFTING (CABG), ON PUMP, TIMES FOUR, LEFT INTERNAL MAMMARY ARTERY TO LAD, RIGHT SVG TO PDA, DISTAL CIRCUMFLEX, AND OM1;  Surgeon: Grace Isaac, MD;  Location: [place];  Service: Open Heart Surgery;  Laterality: N/A;  . ENDOVEIN HARVEST OF GREATER SAPHENOUS VEIN Right 07/08/2020   Procedure: ENDOVEIN HARVEST OF RIGHT GREATER SAPHENOUS VEIN;  Surgeon: Grace Isaac, MD;  Location: [place];  Service: Open Heart Surgery;  Laterality: Right;  . LEFT HEART CATH AND CORONARY ANGIOGRAPHY N/A 07/02/2020   Procedure: LEFT HEART CATH AND CORONARY ANGIOGRAPHY;  Surgeon: Jettie Booze, MD;  Location: [place] CV LAB;  Service: Cardiovascular;  Laterality: N/A;  . NO PAST SURGERIES    . TEE WITHOUT CARDIOVERSION N/A 07/08/2020   Procedure: TRANSESOPHAGEAL ECHOCARDIOGRAM (TEE);  Surgeon: Grace Isaac, MD;  Location: [place];  Service: Open Heart Surgery;   Laterality: N/A;    There were no vitals filed for this visit.   Subjective Assessment - 10/06/20 1806    Subjective  I got my leg over the mower - and mowed the lawn    Currently in Pain? Yes    Pain Score 1     Pain Location Shoulder    Pain Orientation Left    Pain Descriptors / Indicators Aching    Pain Type Chronic pain    Pain Onset 1 to 4 weeks ago    Pain Frequency Constant    Aggravating Factors  resistance, certain movements    Pain Relieving Factors heat or ice              [facility] OT Assessment - 10/06/20 0001      Hand Function   Right Hand Grip (lbs) 65    Left Hand Grip (lbs) 40                    OT Treatments/Exercises (OP) - 10/06/20 0001      ADLs   LB Dressing Patient able to wash ower legs and put on socks.  Patient can bend over to tie shoes using BUE.    Home Maintenance Patient able to get left leg onto mower, and mow his yard for the first time.  He has also been able to drive his four wheeler.    Driving  Discussed areas that are typically assessed to determine readiness for return to driving.  Patient with delayed reaction time at this time, anticipate this will resolve.  Anticiapte patient will return to driving.    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  Patient is not yet ready physically to return to this work.    ADL Comments Reviewed short term goals.  Patient has met all STG at this time - focus now on LTG.      Neurological Re-education Exercises   Other Exercises 1 UBE at level 2.5 - 3 min forward/ 3 min backward - fatigued, but no pain.    Other Exercises 2 Worked on seated exercise to address coordiantion - hand - interlimb coord.  Patient with fatigue in shoulder with activity - but no increase in pain.                  OT Education - 10/06/20 1836    Education Details reviewed goals and progress    Person(s)  Educated Patient    Methods Explanation    Comprehension Verbalized understanding            OT Short Term Goals - 10/06/20 1838      OT SHORT TERM GOAL #1   Title Patient will complete an HEP designed to improve grip strength in left hand    Time 4    Period Weeks    Status Achieved      OT SHORT TERM GOAL #2   Title Patient will complete an HEP designed to improve range of motion and decrease pain in left shoulder    Time 4    Period Weeks    Status Achieved      OT SHORT TERM GOAL #3   Title Patient will report greater ease in  washing lower legs and donning socks.    Time 4    Period Weeks    Status Achieved   pt reports greater ease. 09/22/20     OT SHORT TERM GOAL #4   Title Patient will report improved ability to get onto and off of lawn mower (management of left leg)    Time 4    Period Weeks    Status Achieved             OT Long Term Goals - 10/06/20 1839      OT LONG TERM GOAL #1   Title Patient will complete an updated HEP to address LUE functioning    Time 8    Period Weeks    Status On-going      OT LONG TERM GOAL #2   Title Patient will demonstrate 5 lb increase in left grip strength to improve functional grasp    Time 8    Period Weeks    Status Achieved   40LB     OT LONG TERM GOAL #3   Status On-going      OT LONG TERM GOAL #4   Status On-going      OT LONG TERM GOAL #5   Status On-going                 Plan - 10/06/20 1837    Clinical Impression Statement Pt continues to have dull ache in shoulder - constant - but coordiantion, strength, and functional use are improving.    OT Occupational Profile and History Detailed Assessment- Review of  Records and additional review of physical, cognitive, psychosocial history related to current functional performance    Occupational performance deficits (Please refer to evaluation for details): ADL's;IADL's;Rest and Sleep;Work    Marketing executive / Function / Physical Skills  ADL;Coordination;Endurance;GMC;Muscle spasms;UE functional use;Vestibular;Decreased knowledge of precautions;Balance;Body mechanics;Decreased knowledge of use of DME;Flexibility;IADL;Pain;Strength;FMC;Dexterity;Cardiopulmonary status limiting activity;Mobility;ROM;Tone    Cognitive Skills Attention;Emotional;Energy/Drive;Learn;Memory;Temperament/Personality;Sequencing;Safety Awareness;Problem Solve;Perception    Rehab Potential Good    Clinical Decision Making Several treatment options, min-mod task modification necessary    Comorbidities Affecting Occupational Performance: May have comorbidities impacting occupational performance    Modification or Assistance to Complete Evaluation  Min-Moderate modification of tasks or assist with assess necessary to complete eval    OT Frequency 2x / week    OT Duration 8 weeks    OT Treatment/Interventions Self-care/ADL training;Electrical Stimulation;Therapeutic exercise;Patient/family education;Splinting;Neuromuscular education;Paraffin;Moist Heat;Aquatic Therapy;Fluidtherapy;Energy conservation;Therapist, nutritional;Therapeutic activities;Balance training;Cryotherapy;Ultrasound;Contrast Bath;DME and/or AE instruction;Manual Therapy;Passive range of motion;Cognitive remediation/compensation    Plan continue addressing shoulder strength    Consulted and Agree with Plan of Care Patient           Patient will benefit from skilled therapeutic intervention in order to improve the following deficits and impairments:   Body Structure / Function / Physical Skills: ADL,Coordination,Endurance,GMC,Muscle spasms,UE functional use,Vestibular,Decreased knowledge of precautions,Balance,Body mechanics,Decreased knowledge of use of DME,Flexibility,IADL,Pain,Strength,FMC,Dexterity,Cardiopulmonary status limiting activity,Mobility,ROM,Tone Cognitive Skills: Attention,Emotional,Energy/Drive,Learn,Memory,Temperament/Personality,Sequencing,Safety Awareness,Problem  Solve,Perception     Visit Diagnosis: Acute pain of left shoulder  Muscle weakness (generalized)  Unsteadiness on feet  Hemiplegia and hemiparesis following cerebral infarction affecting left non-dominant side (HCC)  Attention and concentration deficit    Problem List Patient Active Problem List   Diagnosis Date Noted  . Orthostatic syncope 08/06/2020  . CVA (cerebral vascular accident) ([place]) 07/21/2020  . Acute CVA (cerebrovascular accident) ([place]) 07/20/2020  . Glaucoma   . Hypertension   . S/P CABG x 4   . Coronary artery disease 07/08/2020  . NSTEMI (non-ST elevated myocardial infarction) ([place]) 07/02/2020  . Hypertensive emergency 07/01/2020    Mariah Milling, OTR/L 10/06/2020, 6:40 PM  [facility] [address] [place], [place], [address] Phone: [phone]   Fax:  [phone]  Name: KAYLOB WALLEN MRN: [id number] Date of Birth: 08-20-1965

## 2020-10-06 NOTE — Therapy (Signed)
[facility] [address] [place], [place], [address] Phone: [phone]   Fax:  [phone]  Speech Language Pathology Treatment  Patient Details  Name: Leonard Donovan MRN: [id number] Date of Birth: February 29, 1966 Referring Provider (SLP): Ihor Austin NP   Encounter Date: 10/06/2020   End of Session - 10/06/20 1723    Visit Number 8    Number of Visits 17    Date for SLP Re-Evaluation 12/08/20    Authorization Type BCBS    SLP Start Time 1700    SLP Stop Time  1745    SLP Time Calculation (min) 45 min    Activity Tolerance Patient limited by fatigue           Past Medical History:  Diagnosis Date  . CAD (coronary artery disease) of bypass graft    CABG x 4  . Glaucoma   . H/O: CVA (cerebrovascular accident) 2022  . Hyperlipidemia LDL goal <70   . Hypertension     Past Surgical History:  Procedure Laterality Date  . CORONARY ARTERY BYPASS GRAFT N/A 07/08/2020   Procedure: CORONARY ARTERY BYPASS GRAFTING (CABG), ON PUMP, TIMES FOUR, LEFT INTERNAL MAMMARY ARTERY TO LAD, RIGHT SVG TO PDA, DISTAL CIRCUMFLEX, AND OM1;  Surgeon: Delight Ovens, MD;  Location: [facility] OR;  Service: Open Heart Surgery;  Laterality: N/A;  . ENDOVEIN HARVEST OF GREATER SAPHENOUS VEIN Right 07/08/2020   Procedure: ENDOVEIN HARVEST OF RIGHT GREATER SAPHENOUS VEIN;  Surgeon: Delight Ovens, MD;  Location: [facility] OR;  Service: Open Heart Surgery;  Laterality: Right;  . LEFT HEART CATH AND CORONARY ANGIOGRAPHY N/A 07/02/2020   Procedure: LEFT HEART CATH AND CORONARY ANGIOGRAPHY;  Surgeon: Corky Crafts, MD;  Location: [facility] INVASIVE CV LAB;  Service: Cardiovascular;  Laterality: N/A;  . NO PAST SURGERIES    . TEE WITHOUT CARDIOVERSION N/A 07/08/2020   Procedure: TRANSESOPHAGEAL ECHOCARDIOGRAM (TEE);  Surgeon: Delight Ovens, MD;  Location: [facility] OR;  Service: Open Heart Surgery;  Laterality: N/A;    There were no vitals filed for this  visit.   Subjective Assessment - 10/06/20 1701    Subjective "I've been better"    Currently in Pain? Yes    Pain Score 1     Pain Location Shoulder                 ADULT SLP TREATMENT - 10/06/20 1702      General Information   Behavior/Cognition Alert;Cooperative;Pleasant mood;Distractible      Treatment Provided   Treatment provided Cognitive-Linquistic      Cognitive-Linquistic Treatment   Treatment focused on Dysarthria;Aphasia;Cognition;Patient/family/caregiver education    Skilled Treatment Pt endorsed feeling "down and doopy." Pt reported having low pulse since this weekend; however, pt unable to recall changes in medication. Pt noted with delayed processing, reduced volume, and decreased speech intensity this session, seemingly impacted by fatigue. SLP reviewed HEP targeting word finding strategies with use of dysarthria compensations, including slow rate and emphasis, with rare min A to demonstrate. Occasional min A required to ID more appropriate synonyms and additional words in category. Pt endorsed speech is overall more "clear." SLP targeted comprehension of math problems with occasional repetition and clarification required, as pt only to comprehend and complete with 40% accuracy. Mod deviations in attention noted x4.      Assessment / Recommendations / Plan   Plan Continue with current plan of care      Progression Toward Goals   Progression toward  goals Progressing toward goals            SLP Education - 10/06/20 1722    Education Details dysathria compensations, impact on fatigue on cog comm    Person(s) Educated Patient    Methods Explanation;Demonstration;Handout    Comprehension Verbalized understanding;Returned demonstration;Need further instruction            SLP Short Term Goals - 10/06/20 1723      SLP SHORT TERM GOAL #1   Title Pt will verbalize functional application of 2 memory/attention strategies for home/work environments with  occasional min A over 2 sessions    Baseline 10-06-20    Time 1    Period Weeks   or 9 total visits for all STGs   Status On-going      SLP SHORT TERM GOAL #2   Title Pt will demonstrate speech compensations for 100% intelligibility in 10 simple conversation with rare min A over 2 sessions    Baseline 10-06-20    Time 1    Period Weeks    Status On-going      SLP SHORT TERM GOAL #3   Title Pt will verbalize and demonstrate recommended swallow strategies in therapy and at home with min A over 2 sessions    Baseline 09-29-20    Time 1    Period Weeks    Status On-going      SLP SHORT TERM GOAL #4   Title Pt will be able to verbalize three non-physical deficits by giving examples from home or within ST session in 2 sessions    Baseline 09-29-20    Time 1    Period Weeks    Status On-going            SLP Long Term Goals - 10/06/20 1724      SLP LONG TERM GOAL #1   Title Pt will verbalize functional application of 3 memory/attention strategies for home/work environments with rare min A over 2 sessions    Time 5    Period Weeks   or 17 total visits for all LTGs   Status On-going      SLP LONG TERM GOAL #2   Title Pt will demonstrate speech compensations for 100% intelligibility in 15 mod complex conversation with rare min A over 2 sessions    Time 5    Period Weeks    Status On-going      SLP LONG TERM GOAL #3   Title Pt and wife will report consistent carryover of recommended swallow strategies and reduced s/sx of aspiration over 3 sessions    Time 5    Period Weeks    Status On-going      SLP LONG TERM GOAL #4   Title Pt will demonstrate improved insight and awareness of current deficits and impact on his performance with both work and home environments with rare min A over 2 sessions    Time 5    Period Weeks    Status On-going            Plan - 10/06/20 1726    Clinical Impression Statement Leonard Donovan is seen for ST intervention to address mild cognitive communication  impairment and dysphagia s/p CVA in February 2021. Fatigue reported secondary to low pulse, which impacted pt's volume and vocal intenisty. Improved speech intelligibility noted while reading answers on structured tasks with cued use of dysarthria compensations to increase volume and over-ennunicate. Word finding episodes x1 exhibited in conversation this session. Pt recalled  compensation for 3 second oral hold to improve bolus cohesion which has reportedly been effective. Pt would benefit from skilled ST intervention to address mild dysarthria, mild cognitive communication deficits, and mild oral dysphagia to aid patient in return to PLOF and work, reduce communication breakdown, and decrease aspiration risk.    Speech Therapy Frequency 2x / week    Duration 8 weeks   or 17 total visits   Treatment/Interventions Compensatory strategies;Patient/family education;Functional tasks;Cueing hierarchy;Multimodal communcation approach;Cognitive reorganization;Environmental controls;Diet toleration management by SLP;Aspiration precaution training;Compensatory techniques;Internal/external aids;SLP instruction and feedback    Potential to Achieve Goals Fair    Potential Considerations Cooperation/participation level;Previous level of function    SLP Home Exercise Plan provided    Consulted and Agree with Plan of Care Patient           Patient will benefit from skilled therapeutic intervention in order to improve the following deficits and impairments:   Cognitive communication deficit  Dysarthria and anarthria    Problem List Patient Active Problem List   Diagnosis Date Noted  . Orthostatic syncope 08/06/2020  . CVA (cerebral vascular accident) (HCC) 07/21/2020  . Acute CVA (cerebrovascular accident) (HCC) 07/20/2020  . Glaucoma   . Hypertension   . S/P CABG x 4   . Coronary artery disease 07/08/2020  . NSTEMI (non-ST elevated myocardial infarction) (HCC) 07/02/2020  . Hypertensive emergency  07/01/2020    Janann Colonel, MA CCC-SLP 10/06/2020, 6:37 PM  [facility] [address] [place], [place], [address] Phone: [phone]   Fax:  [phone]   Name: Leonard Donovan MRN: [id number] Date of Birth: Dec 12, 1965

## 2020-10-06 NOTE — Telephone Encounter (Signed)
Completed and placed in out box.  Thank you. 

## 2020-10-07 ENCOUNTER — Telehealth: Payer: Self-pay | Admitting: *Deleted

## 2020-10-07 NOTE — Progress Notes (Signed)
HPI: Follow-up coronary artery disease.  Patient ruled in for non-ST elevation myocardial infarction February 2022.  Cardiac catheterization revealed severe three-vessel coronary artery disease.  Echocardiogram showed normal LV function.  Patient had coronary artery bypass graft with LIMA to the LAD, saphenous vein graft to the PDA, distal circumflex and OM1.  Note preoperative carotid Dopplers showed 40 to 59% right carotid artery stenosis.  Following his discharge he returned due to slurred speech and left upper extremity weakness and was found to have CVA.  Repeat echocardiogram showed normal LV function.  Outpatient monitor April 2022 showed sinus rhythm with no atrial fibrillation.  Following his discharge he was seen in the office with 3 episodes of syncope that was felt likely to be orthostatic mediated.  He had 1 in the office and systolic blood pressure was in the 80s.  His medications were discontinued.  We have gradually increased his medications as an outpatient.  Since last seen he notes some dyspnea on exertion but no orthopnea, PND, pedal edema, exertional chest pain or syncope.  His blood pressure is running 150-160/100 at home.  Current Outpatient Medications  Medication Sig Dispense Refill  . acetaminophen (TYLENOL) 500 MG tablet Take 2 tablets (1,000 mg total) by mouth every 8 (eight) hours as needed for mild pain (or discomfort). 30 tablet 0  . amLODipine (NORVASC) 10 MG tablet Take 1 tablet (10 mg total) by mouth daily. 180 tablet 3  . aspirin EC 81 MG EC tablet Take 1 tablet (81 mg total) by mouth daily. Swallow whole. 30 tablet 11  . atorvastatin (LIPITOR) 80 MG tablet Take 1 tablet (80 mg total) by mouth daily. 90 tablet 3  . clopidogrel (PLAVIX) 75 MG tablet Take 1 tablet (75 mg total) by mouth daily. 90 tablet 3  . losartan (COZAAR) 100 MG tablet Take 1 tablet (100 mg total) by mouth daily. 90 tablet 3  . metoprolol succinate (TOPROL-XL) 25 MG 24 hr tablet Take 1 tablet  (25 mg total) by mouth daily. 90 tablet 3  . Multiple Vitamins-Minerals (MULTIVITAMIN WITH MINERALS) tablet Take 1 tablet by mouth daily.    . nitroGLYCERIN (NITROSTAT) 0.4 MG SL tablet Place 1 tablet (0.4 mg total) under the tongue every 5 (five) minutes as needed. 25 tablet 3   No current facility-administered medications for this visit.     Past Medical History:  Diagnosis Date  . CAD (coronary artery disease) of bypass graft    CABG x 4  . Glaucoma   . H/O: CVA (cerebrovascular accident) 2022  . Hyperlipidemia LDL goal <70   . Hypertension     Past Surgical History:  Procedure Laterality Date  . CORONARY ARTERY BYPASS GRAFT N/A 07/08/2020   Procedure: CORONARY ARTERY BYPASS GRAFTING (CABG), ON PUMP, TIMES FOUR, LEFT INTERNAL MAMMARY ARTERY TO LAD, RIGHT SVG TO PDA, DISTAL CIRCUMFLEX, AND OM1;  Surgeon: Delight Ovens, MD;  Location: [facility] OR;  Service: Open Heart Surgery;  Laterality: N/A;  . ENDOVEIN HARVEST OF GREATER SAPHENOUS VEIN Right 07/08/2020   Procedure: ENDOVEIN HARVEST OF RIGHT GREATER SAPHENOUS VEIN;  Surgeon: Delight Ovens, MD;  Location: [facility] OR;  Service: Open Heart Surgery;  Laterality: Right;  . LEFT HEART CATH AND CORONARY ANGIOGRAPHY N/A 07/02/2020   Procedure: LEFT HEART CATH AND CORONARY ANGIOGRAPHY;  Surgeon: Corky Crafts, MD;  Location: [facility] INVASIVE CV LAB;  Service: Cardiovascular;  Laterality: N/A;  . NO PAST SURGERIES    . TEE WITHOUT CARDIOVERSION N/A  07/08/2020   Procedure: TRANSESOPHAGEAL ECHOCARDIOGRAM (TEE);  Surgeon: Delight Ovens, MD;  Location: [facility] OR;  Service: Open Heart Surgery;  Laterality: N/A;    Social History   Socioeconomic History  . Marital status: Married    Spouse name: Not on file  . Number of children: Not on file  . Years of education: Not on file  . Highest education level: Not on file  Occupational History  . Not on file  Tobacco Use  . Smoking status: Former Games developer  . Smokeless tobacco: Former Geophysicist/field seismologist  . Vaping Use: Never used  Substance and Sexual Activity  . Alcohol use: Not Currently  . Drug use: Not Currently  . Sexual activity: Not on file  Other Topics Concern  . Not on file  Social History Narrative  . Not on file   Social Determinants of Health   Financial Resource Strain: Not on file  Food Insecurity: Not on file  Transportation Needs: Not on file  Physical Activity: Not on file  Stress: Not on file  Social Connections: Not on file  Intimate Partner Violence: Not on file    Family History  Problem Relation Age of Onset  . Hypertension Father   . CAD Sister        diagnosed in her 74s  . Heart attack Sister     ROS: Some residual difficulties with speech and left shoulder pain but no fevers or chills, productive cough, hemoptysis, dysphasia, odynophagia, melena, hematochezia, dysuria, hematuria, rash, seizure activity, orthopnea, PND, pedal edema, claudication. Remaining systems are negative.  Physical Exam: Well-developed well-nourished in no acute distress.  Skin is warm and dry.  HEENT is normal.  Neck is supple.  Chest is clear to auscultation with normal expansion.  Cardiovascular exam is regular rate and rhythm.  Abdominal exam nontender or distended. No masses palpated. Extremities show no edema. neuro grossly intact   A/P  1 coronary artery disease status post coronary artery bypass graft-continue aspirin, Plavix and statin.  We will discontinue Plavix in February.  2 previous CVA-continue aspirin and Plavix.  Results of 30-day monitor showed no atrial fibrillation.  3 hypertension-blood pressure is elevated.  I think what likely occurred was blood pressure decreased following recent bypass surgery and is now increased to previous levels.  Discontinue Toprol and instead treat with carvedilol 6.25 mg twice daily.  Add chlorthalidone 25 mg daily.  Check potassium and renal function in 1 week.  Follow blood pressure and adjust medications as  needed.  4 hyperlipidemia-continue statin. Check lipids and liver.  5 carotid artery disease he will need follow-up carotid Dopplers February 2023.  [name], MD

## 2020-10-07 NOTE — Telephone Encounter (Signed)
Pt [place] retirement plan faxed to [employer].

## 2020-10-08 ENCOUNTER — Ambulatory Visit: Payer: BC Managed Care – PPO | Admitting: Occupational Therapy

## 2020-10-08 ENCOUNTER — Other Ambulatory Visit: Payer: Self-pay

## 2020-10-08 ENCOUNTER — Ambulatory Visit: Payer: BC Managed Care – PPO

## 2020-10-08 ENCOUNTER — Encounter: Payer: Self-pay | Admitting: Occupational Therapy

## 2020-10-08 DIAGNOSIS — M6281 Muscle weakness (generalized): Secondary | ICD-10-CM

## 2020-10-08 DIAGNOSIS — R41841 Cognitive communication deficit: Secondary | ICD-10-CM

## 2020-10-08 DIAGNOSIS — R4701 Aphasia: Secondary | ICD-10-CM

## 2020-10-08 DIAGNOSIS — I69354 Hemiplegia and hemiparesis following cerebral infarction affecting left non-dominant side: Secondary | ICD-10-CM

## 2020-10-08 DIAGNOSIS — R2681 Unsteadiness on feet: Secondary | ICD-10-CM

## 2020-10-08 DIAGNOSIS — R471 Dysarthria and anarthria: Secondary | ICD-10-CM

## 2020-10-08 DIAGNOSIS — M25512 Pain in left shoulder: Secondary | ICD-10-CM

## 2020-10-08 NOTE — Therapy (Signed)
[facility] [address] [place], [place], [address] Phone: [phone]   Fax:  [phone]  Speech Language Pathology Treatment  Patient Details  Name: Leonard Donovan MRN: [id number] Date of Birth: 1966/02/21 Referring Provider (SLP): Ihor Austin NP   Encounter Date: 10/08/2020   End of Session - 10/08/20 1719    Visit Number 9    Number of Visits 17    Date for SLP Re-Evaluation 12/08/20    Authorization Type BCBS    SLP Start Time 1745    SLP Stop Time  1830    SLP Time Calculation (min) 45 min    Activity Tolerance Patient tolerated treatment well           Past Medical History:  Diagnosis Date  . CAD (coronary artery disease) of bypass graft    CABG x 4  . Glaucoma   . H/O: CVA (cerebrovascular accident) 2022  . Hyperlipidemia LDL goal <70   . Hypertension     Past Surgical History:  Procedure Laterality Date  . CORONARY ARTERY BYPASS GRAFT N/A 07/08/2020   Procedure: CORONARY ARTERY BYPASS GRAFTING (CABG), ON PUMP, TIMES FOUR, LEFT INTERNAL MAMMARY ARTERY TO LAD, RIGHT SVG TO PDA, DISTAL CIRCUMFLEX, AND OM1;  Surgeon: Delight Ovens, MD;  Location: [facility] OR;  Service: Open Heart Surgery;  Laterality: N/A;  . ENDOVEIN HARVEST OF GREATER SAPHENOUS VEIN Right 07/08/2020   Procedure: ENDOVEIN HARVEST OF RIGHT GREATER SAPHENOUS VEIN;  Surgeon: Delight Ovens, MD;  Location: [facility] OR;  Service: Open Heart Surgery;  Laterality: Right;  . LEFT HEART CATH AND CORONARY ANGIOGRAPHY N/A 07/02/2020   Procedure: LEFT HEART CATH AND CORONARY ANGIOGRAPHY;  Surgeon: Corky Crafts, MD;  Location: [facility] INVASIVE CV LAB;  Service: Cardiovascular;  Laterality: N/A;  . NO PAST SURGERIES    . TEE WITHOUT CARDIOVERSION N/A 07/08/2020   Procedure: TRANSESOPHAGEAL ECHOCARDIOGRAM (TEE);  Surgeon: Delight Ovens, MD;  Location: [facility] OR;  Service: Open Heart Surgery;  Laterality: N/A;    There were no vitals filed for this  visit.   Subjective Assessment - 10/08/20 1743    Subjective "tired"    Currently in Pain? Yes    Pain Score 2     Pain Location Shoulder                 ADULT SLP TREATMENT - 10/08/20 1718      General Information   Behavior/Cognition Alert;Cooperative;Pleasant mood;Distractible      Treatment Provided   Treatment provided Cognitive-Linquistic      Cognitive-Linquistic Treatment   Treatment focused on Dysarthria;Aphasia;Cognition;Patient/family/caregiver education    Skilled Treatment Pt reports feeling "tired" which impacted his speech. Pt's wife had reported his speech was "slurred" due to fatigue. SLP targeted use of dysarthria strategies (slow rate, increase volume, emphasis), which pt able to demo strategies at sentence level with occasional min A to reduce rushes in speech, particularly at end of sentences. SLP targeted comprehension of math problems, with occasional min A required. SLP targeting naming words given letter prompt, with pt completed with ~75% accuracy. Pt benefited from additonal processing time and occasional semantic and rare phonemic cues. Of note, occasional idiopathic laughing impacted pt's attention x2 on structured tasks. Other deviations in attention x2 noted.      Assessment / Recommendations / Plan   Plan Continue with current plan of care      Progression Toward Goals   Progression toward goals Progressing toward goals  SLP Education - 10/08/20 1827    Education Details dysarthria compensations, oral bolus hold    Person(s) Educated Patient    Methods Explanation;Demonstration;Handout    Comprehension Verbalized understanding;Returned demonstration;Need further instruction            SLP Short Term Goals - 10/08/20 1719      SLP SHORT TERM GOAL #1   Title Pt will verbalize functional application of 2 memory/attention strategies for home/work environments with occasional min A over 2 sessions    Baseline 10-06-20    Time 1     Period Weeks   or 9 total visits for all STGs   Status On-going      SLP SHORT TERM GOAL #2   Title Pt will demonstrate speech compensations for 100% intelligibility in 10 simple conversation with rare min A over 2 sessions    Baseline 10-06-20    Time 1    Period Weeks    Status On-going      SLP SHORT TERM GOAL #3   Title Pt will verbalize and demonstrate recommended swallow strategies in therapy and at home with min A over 2 sessions    Baseline 09-29-20, 10-08-20    Time --    Period --    Status Achieved      SLP SHORT TERM GOAL #4   Title Pt will be able to verbalize three non-physical deficits by giving examples from home or within ST session in 2 sessions    Baseline 09-29-20    Time 1    Period Weeks    Status On-going            SLP Long Term Goals - 10/08/20 1719      SLP LONG TERM GOAL #1   Title Pt will verbalize functional application of 3 memory/attention strategies for home/work environments with rare min A over 2 sessions    Time 5    Period Weeks   or 17 total visits for all LTGs   Status On-going      SLP LONG TERM GOAL #2   Title Pt will demonstrate speech compensations for 100% intelligibility in 15 mod complex conversation with rare min A over 2 sessions    Time 5    Period Weeks    Status On-going      SLP LONG TERM GOAL #3   Title Pt and wife will report consistent carryover of recommended swallow strategies and reduced s/sx of aspiration over 3 sessions    Time 5    Period Weeks    Status On-going      SLP LONG TERM GOAL #4   Title Pt will demonstrate improved insight and awareness of current deficits and impact on his performance with both work and home environments with rare min A over 2 sessions    Time 5    Period Weeks    Status On-going            Plan - 10/08/20 1829    Clinical Impression Statement Leonard Donovan is seen for ST intervention to address mild cognitive communication impairment and dysphagia s/p CVA in February 2021.  Fatigue reported again this session, which impacted pt's volume and speech intelligibility. Adequate speech intelligibility noted while reading answers on structured tasks with cued use of dysarthria compensations to increase volume and over-ennunicate. Pt recalled compensation for 3 second oral hold to improve bolus cohesion which has reportedly been effective. See "skilled treatment" for additional details of today's session. Pt would benefit  from skilled ST intervention to address mild dysarthria, mild cognitive communication deficits, and mild oral dysphagia to aid patient in return to PLOF and work, reduce communication breakdown, and decrease aspiration risk.    Speech Therapy Frequency 2x / week    Duration 8 weeks   or 17 total visits   Treatment/Interventions Compensatory strategies;Patient/family education;Functional tasks;Cueing hierarchy;Multimodal communcation approach;Cognitive reorganization;Environmental controls;Diet toleration management by SLP;Aspiration precaution training;Compensatory techniques;Internal/external aids;SLP instruction and feedback    Potential to Achieve Goals Fair    Potential Considerations Cooperation/participation level;Previous level of function    SLP Home Exercise Plan provided    Consulted and Agree with Plan of Care Patient           Patient will benefit from skilled therapeutic intervention in order to improve the following deficits and impairments:   Dysarthria and anarthria  Aphasia  Cognitive communication deficit    Problem List Patient Active Problem List   Diagnosis Date Noted  . Orthostatic syncope 08/06/2020  . CVA (cerebral vascular accident) (HCC) 07/21/2020  . Acute CVA (cerebrovascular accident) (HCC) 07/20/2020  . Glaucoma   . Hypertension   . S/P CABG x 4   . Coronary artery disease 07/08/2020  . NSTEMI (non-ST elevated myocardial infarction) (HCC) 07/02/2020  . Hypertensive emergency 07/01/2020    Janann Colonel, MA  CCC-SLP 10/08/2020, 6:32 PM  [place] [facility] [address] [place], [place], [address] Phone: [phone]   Fax:  [phone]   Name: Leonard Donovan MRN: [id number] Date of Birth: 11-19-1965

## 2020-10-08 NOTE — Therapy (Signed)
[facility] [address] [place], [place], [address] Phone: [phone]   Fax:  [phone]  Occupational Therapy Treatment  Patient Details  Name: KEVIN SPACE MRN: [id number] Date of Birth: [birth year] Referring Provider (OT): Ihor Austin   Encounter Date: 10/08/2020   OT End of Session - 10/08/20 1846    Visit Number 9    Number of Visits 17    Date for OT Re-Evaluation 11/23/20    Authorization Type BCBS:  VL:MN    OT Start Time 1700    OT Stop Time 1745    OT Time Calculation (min) 45 min    Activity Tolerance Patient tolerated treatment well    Behavior During Therapy [facility] for tasks assessed/performed           Past Medical History:  Diagnosis Date  . CAD (coronary artery disease) of bypass graft    CABG x 4  . Glaucoma   . H/O: CVA (cerebrovascular accident) 2022  . Hyperlipidemia LDL goal <70   . Hypertension     Past Surgical History:  Procedure Laterality Date  . CORONARY ARTERY BYPASS GRAFT N/A 07/08/2020   Procedure: CORONARY ARTERY BYPASS GRAFTING (CABG), ON PUMP, TIMES FOUR, LEFT INTERNAL MAMMARY ARTERY TO LAD, RIGHT SVG TO PDA, DISTAL CIRCUMFLEX, AND OM1;  Surgeon: Delight Ovens, MD;  Location: [facility] OR;  Service: Open Heart Surgery;  Laterality: N/A;  . ENDOVEIN HARVEST OF GREATER SAPHENOUS VEIN Right 07/08/2020   Procedure: ENDOVEIN HARVEST OF RIGHT GREATER SAPHENOUS VEIN;  Surgeon: Delight Ovens, MD;  Location: [facility] OR;  Service: Open Heart Surgery;  Laterality: Right;  . LEFT HEART CATH AND CORONARY ANGIOGRAPHY N/A 07/02/2020   Procedure: LEFT HEART CATH AND CORONARY ANGIOGRAPHY;  Surgeon: Corky Crafts, MD;  Location: [facility] INVASIVE CV LAB;  Service: Cardiovascular;  Laterality: N/A;  . NO PAST SURGERIES    . TEE WITHOUT CARDIOVERSION N/A 07/08/2020   Procedure: TRANSESOPHAGEAL ECHOCARDIOGRAM (TEE);  Surgeon: Delight Ovens, MD;  Location: [facility] OR;  Service: Open Heart Surgery;   Laterality: N/A;    There were no vitals filed for this visit.                 OT Treatments/Exercises (OP) - 10/08/20 0001      ADLs   ADL Comments Wife attended therapy today and clarified that patient got onto his four wheeler, but not onto his lawn mower.  He has not yet been able to [facility] lawn, and anticipates this would be difficult  due to frequent turning.  WIll continue to monitor.  Sent message to Neuro regarding persistent shoulder pain - requesting possible imaging to r/o mm tear.      Neurological Re-education Exercises   Other Exercises 1 Neuromuscular reeducation to address body on arm movement to help alleviate shoulder pain.  Seated at table with arms supported at shoulder height - worked on anterior / postrior pelvic tilt and subsequent head and trunk movements.  Worked on lateral weight shifts as well to address horizontal abd/adduction in pain free ranges.  Patient with limited trunk mobility, so transitioned to seated on physioball to address trunk rotation as well as arm movement - moving body on stable arm in pain free ranges. Patient reports pain improved slightly with this activity (from 2-1)                  OT Education - 10/08/20 1845    Education Details body on stable  arm while seated at table - arms supported at shoulder height - weight shift forward, backward, laterally    Person(s) Educated Patient;Spouse    Methods Explanation;Demonstration;Tactile cues    Comprehension Verbalized understanding;Returned demonstration;Need further instruction            OT Short Term Goals - 10/08/20 1848      OT SHORT TERM GOAL #1   Title Patient will complete an HEP designed to improve grip strength in left hand    Time 4    Period Weeks    Status Achieved      OT SHORT TERM GOAL #2   Title Patient will complete an HEP designed to improve range of motion and decrease pain in left shoulder    Time 4    Period Weeks    Status Achieved      OT  SHORT TERM GOAL #3   Title Patient will report greater ease in  washing lower legs and donning socks.    Time 4    Period Weeks    Status Achieved   pt reports greater ease. 09/22/20     OT SHORT TERM GOAL #4   Title Patient will report improved ability to get onto and off of lawn mower (management of left leg)    Time 4    Period Weeks    Status On-going             OT Long Term Goals - 10/08/20 1848      OT LONG TERM GOAL #1   Title Patient will complete an updated HEP to address LUE functioning    Time 8    Period Weeks    Status On-going      OT LONG TERM GOAL #2   Title Patient will demonstrate 5 lb increase in left grip strength to improve functional grasp    Time 8    Period Weeks    Status Achieved   40LB     OT LONG TERM GOAL #3   Status On-going      OT LONG TERM GOAL #4   Status On-going      OT LONG TERM GOAL #5   Status On-going                 Plan - 10/08/20 1847    Clinical Impression Statement Pt continues to have dull ache in shoulder - constant.  Ranges from 1-2 to 4-6 with activity.  Have sent message to neuro to request imaging to r/o structural damage.    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 ADL;Coordination;Endurance;GMC;Muscle spasms;UE functional use;Vestibular;Decreased knowledge of precautions;Balance;Body mechanics;Decreased knowledge of use of DME;Flexibility;IADL;Pain;Strength;FMC;Dexterity;Cardiopulmonary status limiting activity;Mobility;ROM;Tone    Cognitive Skills Attention;Emotional;Energy/Drive;Learn;Memory;Temperament/Personality;Sequencing;Safety Awareness;Problem Solve;Perception    Rehab Potential Good    Clinical Decision  Making Several treatment options, min-mod task modification necessary    Comorbidities Affecting Occupational Performance: May have comorbidities impacting occupational performance    Modification or Assistance to Complete Evaluation  Min-Moderate modification of tasks or assist with assess necessary to complete eval    OT Frequency 2x / week    OT Duration 8 weeks    OT Treatment/Interventions Self-care/ADL training;Electrical Stimulation;Therapeutic exercise;Patient/family education;Splinting;Neuromuscular education;Paraffin;Moist Heat;Aquatic Therapy;Fluidtherapy;Energy conservation;Building services engineer;Therapeutic activities;Balance training;Cryotherapy;Ultrasound;Contrast Bath;DME and/or  AE instruction;Manual Therapy;Passive range of motion;Cognitive remediation/compensation    Plan continue addressing shoulder pain    Consulted and Agree with Plan of Care Patient    Family Member Consulted Wife Carla           Patient will benefit from skilled therapeutic intervention in order to improve the following deficits and impairments:   Body Structure / Function / Physical Skills: ADL,Coordination,Endurance,GMC,Muscle spasms,UE functional use,Vestibular,Decreased knowledge of precautions,Balance,Body mechanics,Decreased knowledge of use of DME,Flexibility,IADL,Pain,Strength,FMC,Dexterity,Cardiopulmonary status limiting activity,Mobility,ROM,Tone Cognitive Skills: Attention,Emotional,Energy/Drive,Learn,Memory,Temperament/Personality,Sequencing,Safety Awareness,Problem Solve,Perception     Visit Diagnosis: Acute pain of left shoulder  Muscle weakness (generalized)  Unsteadiness on feet  Hemiplegia and hemiparesis following cerebral infarction affecting left non-dominant side [facility])    Problem List Patient Active Problem List   Diagnosis Date Noted  . Orthostatic syncope 08/06/2020  . CVA (cerebral vascular accident) (HCC) 07/21/2020  . Acute CVA (cerebrovascular accident) (HCC)  07/20/2020  . Glaucoma   . Hypertension   . S/P CABG x 4   . Coronary artery disease 07/08/2020  . NSTEMI (non-ST elevated myocardial infarction) (HCC) 07/02/2020  . Hypertensive emergency 07/01/2020    Collier Salina, OTR/L 10/08/2020, 6:49 PM  [facility] [address] [place], [place], [address] Phone: [phone]   Fax:  [phone]  Name: CLENTON ESPER MRN: [id number] Date of Birth: 11-23-1965

## 2020-10-13 ENCOUNTER — Ambulatory Visit: Payer: BC Managed Care – PPO | Admitting: Occupational Therapy

## 2020-10-13 ENCOUNTER — Other Ambulatory Visit: Payer: Self-pay

## 2020-10-13 ENCOUNTER — Encounter: Payer: Self-pay | Admitting: Occupational Therapy

## 2020-10-13 ENCOUNTER — Ambulatory Visit: Payer: BC Managed Care – PPO

## 2020-10-13 DIAGNOSIS — I69354 Hemiplegia and hemiparesis following cerebral infarction affecting left non-dominant side: Secondary | ICD-10-CM | POA: Diagnosis not present

## 2020-10-13 DIAGNOSIS — M6281 Muscle weakness (generalized): Secondary | ICD-10-CM

## 2020-10-13 DIAGNOSIS — R41841 Cognitive communication deficit: Secondary | ICD-10-CM

## 2020-10-13 DIAGNOSIS — R471 Dysarthria and anarthria: Secondary | ICD-10-CM

## 2020-10-13 DIAGNOSIS — R2681 Unsteadiness on feet: Secondary | ICD-10-CM

## 2020-10-13 DIAGNOSIS — M25512 Pain in left shoulder: Secondary | ICD-10-CM

## 2020-10-13 DIAGNOSIS — R4701 Aphasia: Secondary | ICD-10-CM

## 2020-10-13 DIAGNOSIS — R41840 Attention and concentration deficit: Secondary | ICD-10-CM

## 2020-10-13 NOTE — Patient Instructions (Addendum)
   You need to write down when coughing/choking episodes after they happen. Write down exactly what happened and try to figure out what caused it (too big of a bite, not enough chewing, distracted,etc).             You need to be aware of how you are functioning (are you slurring your words, are you having trouble finding your words, are you loosing focus?). Write down any scenarios when you notice these issues.

## 2020-10-13 NOTE — Therapy (Signed)
[facility] [address] [place], [place], [address] Phone: [phone]   Fax:  [phone]  Occupational Therapy Treatment  Patient Details  Name: Leonard Donovan MRN: [id number] Date of Birth: 09/29/1965 Referring Provider (OT): Ihor Austin   Encounter Date: 10/13/2020   OT End of Session - 10/13/20 1747    Visit Number 10    Number of Visits 17    Date for OT Re-Evaluation 11/23/20    Authorization Type BCBS:  VL:MN    OT Start Time 1746    OT Stop Time 1830    OT Time Calculation (min) 44 min    Activity Tolerance Patient tolerated treatment well    Behavior During Therapy [facility] for tasks assessed/performed           Past Medical History:  Diagnosis Date  . CAD (coronary artery disease) of bypass graft    CABG x 4  . Glaucoma   . H/O: CVA (cerebrovascular accident) 2022  . Hyperlipidemia LDL goal <70   . Hypertension     Past Surgical History:  Procedure Laterality Date  . CORONARY ARTERY BYPASS GRAFT N/A 07/08/2020   Procedure: CORONARY ARTERY BYPASS GRAFTING (CABG), ON PUMP, TIMES FOUR, LEFT INTERNAL MAMMARY ARTERY TO LAD, RIGHT SVG TO PDA, DISTAL CIRCUMFLEX, AND OM1;  Surgeon: Delight Ovens, MD;  Location: [facility] OR;  Service: Open Heart Surgery;  Laterality: N/A;  . ENDOVEIN HARVEST OF GREATER SAPHENOUS VEIN Right 07/08/2020   Procedure: ENDOVEIN HARVEST OF RIGHT GREATER SAPHENOUS VEIN;  Surgeon: Delight Ovens, MD;  Location: [facility] OR;  Service: Open Heart Surgery;  Laterality: Right;  . LEFT HEART CATH AND CORONARY ANGIOGRAPHY N/A 07/02/2020   Procedure: LEFT HEART CATH AND CORONARY ANGIOGRAPHY;  Surgeon: Corky Crafts, MD;  Location: [facility] INVASIVE CV LAB;  Service: Cardiovascular;  Laterality: N/A;  . NO PAST SURGERIES    . TEE WITHOUT CARDIOVERSION N/A 07/08/2020   Procedure: TRANSESOPHAGEAL ECHOCARDIOGRAM (TEE);  Surgeon: Delight Ovens, MD;  Location: [facility] OR;  Service: Open Heart Surgery;   Laterality: N/A;    There were no vitals filed for this visit.   Subjective Assessment - 10/13/20 1749    Subjective  Pt reports 2/10 pain at rest.    Currently in Pain? Yes    Pain Score 2    at rest   Pain Location Shoulder    Pain Orientation Left    Pain Descriptors / Indicators Aching    Pain Type Chronic pain    Pain Onset 1 to 4 weeks ago    Pain Frequency Constant            Weight Shifting with arms on counter with anterior/posterior and lateral shifts for gentle shoulder ROM  Pendulum Swings with LUE lateral swings, forward/backward swings, clockwise/counterclockwise  Blocks with LUE and placing onto box approx 10" surface - pt reports increased pain with this activity to about a 3-4/10  Constant Therapy Alternating Symbols level 6 with 99% accuracy and 72.47s response time. Alternating Words (uppcase/lowercases) level 2 with increased time - did not finish or get accuracy d/t time constraint. Pt with increased difficulty with attending to the alternating words task.                        OT Short Term Goals - 10/08/20 1848      OT SHORT TERM GOAL #1   Title Patient will complete an HEP designed to improve  grip strength in left hand    Time 4    Period Weeks    Status Achieved      OT SHORT TERM GOAL #2   Title Patient will complete an HEP designed to improve range of motion and decrease pain in left shoulder    Time 4    Period Weeks    Status Achieved      OT SHORT TERM GOAL #3   Title Patient will report greater ease in  washing lower legs and donning socks.    Time 4    Period Weeks    Status Achieved   pt reports greater ease. 09/22/20     OT SHORT TERM GOAL #4   Title Patient will report improved ability to get onto and off of lawn mower (management of left leg)    Time 4    Period Weeks    Status On-going             OT Long Term Goals - 10/13/20 1758      OT LONG TERM GOAL #1   Title Patient will complete an updated  HEP to address LUE functioning    Time 8    Period Weeks    Status On-going      OT LONG TERM GOAL #2   Title Patient will demonstrate 5 lb increase in left grip strength to improve functional grasp    Time 4    Period Weeks    Status Achieved   40lbs with LUE 10/06/20     OT LONG TERM GOAL #3   Title Patient will reach overhead to obtain and place an object no heavier than 2 lbs x 3 without increased pain.    Time 8    Period Weeks    Status On-going      OT LONG TERM GOAL #4   Title Patient and wife will demonstrate understanding of return to driving recommendations    Time 8    Period Weeks    Status On-going      OT LONG TERM GOAL #5   Title Patient and wife will demonstrate understanding of return to work recommendations    Time 8    Period Weeks    Status On-going                 Plan - 10/13/20 1821    Clinical Impression Statement Pt continues to consistently report pain in LUE shoulder, even at rest and with gentle ROM. Pt verbalized understanding for following up with PCP for imaging for LUE shoulder.    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 ADL;Coordination;Endurance;GMC;Muscle spasms;UE functional use;Vestibular;Decreased knowledge of precautions;Balance;Body mechanics;Decreased knowledge of use of DME;Flexibility;IADL;Pain;Strength;FMC;Dexterity;Cardiopulmonary status limiting activity;Mobility;ROM;Tone    Cognitive Skills Attention;Emotional;Energy/Drive;Learn;Memory;Temperament/Personality;Sequencing;Safety Awareness;Problem Solve;Perception    Rehab Potential Good    Clinical Decision Making Several treatment options, min-mod task modification necessary     Comorbidities Affecting Occupational Performance: May have comorbidities impacting occupational performance    Modification or Assistance to Complete Evaluation  Min-Moderate modification of tasks or assist with assess necessary to complete eval    OT Frequency 2x / week    OT Duration 8 weeks    OT Treatment/Interventions Self-care/ADL training;Electrical Stimulation;Therapeutic exercise;Patient/family education;Splinting;Neuromuscular education;Paraffin;Moist Heat;Aquatic  Therapy;Fluidtherapy;Energy conservation;Building services engineer;Therapeutic activities;Balance training;Cryotherapy;Ultrasound;Contrast Bath;DME and/or AE instruction;Manual Therapy;Passive range of motion;Cognitive remediation/compensation    Plan continue addressing shoulder pain    Consulted and Agree with Plan of Care Patient    Family Member Consulted Wife Carla           Patient will benefit from skilled therapeutic intervention in order to improve the following deficits and impairments:   Body Structure / Function / Physical Skills: ADL,Coordination,Endurance,GMC,Muscle spasms,UE functional use,Vestibular,Decreased knowledge of precautions,Balance,Body mechanics,Decreased knowledge of use of DME,Flexibility,IADL,Pain,Strength,FMC,Dexterity,Cardiopulmonary status limiting activity,Mobility,ROM,Tone Cognitive Skills: Attention,Emotional,Energy/Drive,Learn,Memory,Temperament/Personality,Sequencing,Safety Awareness,Problem Solve,Perception     Visit Diagnosis: Attention and concentration deficit  Acute pain of left shoulder  Muscle weakness (generalized)  Unsteadiness on feet  Hemiplegia and hemiparesis following cerebral infarction affecting left non-dominant side [facility])    Problem List Patient Active Problem List   Diagnosis Date Noted  . Orthostatic syncope 08/06/2020  . CVA (cerebral vascular accident) (HCC) 07/21/2020  . Acute CVA (cerebrovascular accident) (HCC) 07/20/2020  . Glaucoma   .  Hypertension   . S/P CABG x 4   . Coronary artery disease 07/08/2020  . NSTEMI (non-ST elevated myocardial infarction) (HCC) 07/02/2020  . Hypertensive emergency 07/01/2020    Junious Dresser MOT, OTR/L  10/13/2020, 6:36 PM  [facility] [address] [place], [place], [address] Phone: [phone]   Fax:  [phone]  Name: Leonard Donovan MRN: [id number] Date of Birth: [birth year]

## 2020-10-13 NOTE — Therapy (Signed)
[facility] [address] [place], [place], [address] Phone: [phone]   Fax:  [phone]  Speech Language Pathology Treatment  Patient Details  Name: Leonard Donovan MRN: [id number] Date of Birth: 1965/09/03 Referring Provider (SLP): Frann Rider NP   Encounter Date: 10/13/2020   End of Session - 10/13/20 1721    Visit Number 10    Number of Visits 17    Date for SLP Re-Evaluation 12/08/20    Authorization Type BCBS    SLP Start Time 7078    SLP Stop Time  6754    SLP Time Calculation (min) 43 min    Activity Tolerance Patient tolerated treatment well           Past Medical History:  Diagnosis Date  . CAD (coronary artery disease) of bypass graft    CABG x 4  . Glaucoma   . H/O: CVA (cerebrovascular accident) 2022  . Hyperlipidemia LDL goal <70   . Hypertension     Past Surgical History:  Procedure Laterality Date  . CORONARY ARTERY BYPASS GRAFT N/A 07/08/2020   Procedure: CORONARY ARTERY BYPASS GRAFTING (CABG), ON PUMP, TIMES FOUR, LEFT INTERNAL MAMMARY ARTERY TO LAD, RIGHT SVG TO PDA, DISTAL CIRCUMFLEX, AND OM1;  Surgeon: Grace Isaac, MD;  Location: [facility];  Service: Open Heart Surgery;  Laterality: N/A;  . ENDOVEIN HARVEST OF GREATER SAPHENOUS VEIN Right 07/08/2020   Procedure: ENDOVEIN HARVEST OF RIGHT GREATER SAPHENOUS VEIN;  Surgeon: Grace Isaac, MD;  Location: [facility];  Service: Open Heart Surgery;  Laterality: Right;  . LEFT HEART CATH AND CORONARY ANGIOGRAPHY N/A 07/02/2020   Procedure: LEFT HEART CATH AND CORONARY ANGIOGRAPHY;  Surgeon: Jettie Booze, MD;  Location: [facility];  Service: Cardiovascular;  Laterality: N/A;  . NO PAST SURGERIES    . TEE WITHOUT CARDIOVERSION N/A 07/08/2020   Procedure: TRANSESOPHAGEAL ECHOCARDIOGRAM (TEE);  Surgeon: Grace Isaac, MD;  Location: [facility];  Service: Open Heart Surgery;  Laterality: N/A;    There were no vitals filed for this  visit.   Subjective Assessment - 10/13/20 1704    Subjective "I'm okay"    Currently in Pain? Yes    Pain Score 2     Pain Location Shoulder                 ADULT SLP TREATMENT - 10/13/20 1704      General Information   Behavior/Cognition Alert;Cooperative;Pleasant mood;Distractible      Treatment Provided   Treatment provided Cognitive-Linquistic      Cognitive-Linquistic Treatment   Treatment focused on Dysarthria;Aphasia;Cognition;Patient/family/caregiver education    Skilled Treatment SLP reviewed naming HEP, in which pt completed convergent naming with occasional min A due to anomia. Pt demo'd dysarthria compensations with occasional min A to slow rate of speech to improve listener comprehension. Pt continues to present with limited awareness related to cognitive communication and swallowing deficits (ex: pt indicated he choked on beet but could not infer what caused him to choke). SLP provided metacognition tasks with writitng down episodes as HEP for next session.      Assessment / Recommendations / Plan   Plan Continue with current plan of care      Progression Toward Goals   Progression toward goals Progressing toward goals            SLP Education - 10/13/20 1750    Education Details awareness techniques, rationale for word finding activities  Person(s) Educated Patient    Methods Explanation;Demonstration;Handout    Comprehension Verbalized understanding;Returned demonstration;Need further instruction            SLP Short Term Goals - 10/13/20 1722      SLP SHORT TERM GOAL #1   Title Pt will verbalize functional application of 2 memory/attention strategies for home/work environments with occasional min A over 2 sessions    Baseline 10-06-20    Period Weeks   or 9 total visits for all STGs   Status Partially Met      SLP SHORT TERM GOAL #2   Title Pt will demonstrate speech compensations for 100% intelligibility in 10 simple conversation with rare  min A over 2 sessions    Baseline 10-06-20    Status Partially Met      SLP SHORT TERM GOAL #3   Title Pt will verbalize and demonstrate recommended swallow strategies in therapy and at home with min A over 2 sessions    Baseline 09-29-20, 10-08-20    Status Achieved      SLP SHORT TERM GOAL #4   Title Pt will be able to verbalize three non-physical deficits by giving examples from home or within ST session in 2 sessions    Baseline 09-29-20    Status Partially Met            SLP Long Term Goals - 10/13/20 1722      SLP LONG TERM GOAL #1   Title Pt will verbalize functional application of 3 memory/attention strategies for home/work environments with rare min A over 2 sessions    Time 4    Period Weeks   or 17 total visits for all LTGs   Status On-going      SLP LONG TERM GOAL #2   Title Pt will demonstrate speech compensations for 100% intelligibility in 15 mod complex conversation with rare min A over 2 sessions    Time 4    Period Weeks    Status On-going      SLP LONG TERM GOAL #3   Title Pt and wife will report consistent carryover of recommended swallow strategies and reduced s/sx of aspiration over 3 sessions    Time 4    Period Weeks    Status On-going      SLP LONG TERM GOAL #4   Title Pt will demonstrate improved insight and awareness of current deficits and impact on his performance with both work and home environments with rare min A over 2 sessions    Time 4    Period Weeks    Status On-going            Plan - 10/13/20 1748    Clinical Impression Statement Leonard Donovan is seen for ST intervention to address mild cognitive communication impairment and dysphagia s/p CVA in February 2021. Adequate speech intelligibility noted while reading answers on structured tasks with occasional cues to slow rate of speech for improved intelligibility. Pt demonstrates limited awareness of cognitive communication and swallowing issues, in which SLP suggested patient keep written  documentation of when episodes occur to increase awareness. See "skilled treatment" for additional details of today's session. Pt would benefit from skilled ST intervention to address mild dysarthria, mild cognitive communication deficits, and mild oral dysphagia to aid patient in return to PLOF and work, reduce communication breakdown, and decrease aspiration risk.    Speech Therapy Frequency 2x / week    Duration 8 weeks   or 17 total visits  Treatment/Interventions Compensatory strategies;Patient/family education;Functional tasks;Cueing hierarchy;Multimodal communcation approach;Cognitive reorganization;Environmental controls;Diet toleration management by SLP;Aspiration precaution training;Compensatory techniques;Internal/external aids;SLP instruction and feedback    Potential to Achieve Goals Fair    Potential Considerations Cooperation/participation level;Previous level of function    SLP Home Exercise Plan provided    Consulted and Agree with Plan of Care Patient           Patient will benefit from skilled therapeutic intervention in order to improve the following deficits and impairments:   Dysarthria and anarthria  Aphasia  Cognitive communication deficit    Problem List Patient Active Problem List   Diagnosis Date Noted  . Orthostatic syncope 08/06/2020  . CVA (cerebral vascular accident) ([facility]) 07/21/2020  . Acute CVA (cerebrovascular accident) ([facility]) 07/20/2020  . Glaucoma   . Hypertension   . S/P CABG x 4   . Coronary artery disease 07/08/2020  . NSTEMI (non-ST elevated myocardial infarction) ([facility]) 07/02/2020  . Hypertensive emergency 07/01/2020    Alinda Deem, [place] CCC-SLP 10/13/2020, 5:51 PM  [facility] [address] [place], [place], [address] Phone: [phone]   Fax:  [phone]   Name: Leonard Donovan MRN: [id number] Date of Birth: [birth year]

## 2020-10-15 ENCOUNTER — Encounter: Payer: Self-pay | Admitting: Occupational Therapy

## 2020-10-15 ENCOUNTER — Ambulatory Visit: Payer: BC Managed Care – PPO | Admitting: Occupational Therapy

## 2020-10-15 ENCOUNTER — Ambulatory Visit: Payer: BC Managed Care – PPO

## 2020-10-15 ENCOUNTER — Other Ambulatory Visit: Payer: Self-pay

## 2020-10-15 DIAGNOSIS — R2681 Unsteadiness on feet: Secondary | ICD-10-CM

## 2020-10-15 DIAGNOSIS — M25512 Pain in left shoulder: Secondary | ICD-10-CM

## 2020-10-15 DIAGNOSIS — R41841 Cognitive communication deficit: Secondary | ICD-10-CM

## 2020-10-15 DIAGNOSIS — R41840 Attention and concentration deficit: Secondary | ICD-10-CM

## 2020-10-15 DIAGNOSIS — R471 Dysarthria and anarthria: Secondary | ICD-10-CM

## 2020-10-15 DIAGNOSIS — R4701 Aphasia: Secondary | ICD-10-CM

## 2020-10-15 DIAGNOSIS — I69354 Hemiplegia and hemiparesis following cerebral infarction affecting left non-dominant side: Secondary | ICD-10-CM

## 2020-10-15 DIAGNOSIS — M6281 Muscle weakness (generalized): Secondary | ICD-10-CM

## 2020-10-15 NOTE — Therapy (Signed)
[facility] [address] [place], [place], [address] Phone: [phone]   Fax:  [phone]  Speech Language Pathology Treatment  Patient Details  Name: Leonard Donovan MRN: [id number] Date of Birth: Jun 12, 1965 Referring Provider (SLP): Leonard Rider NP   Encounter Date: 10/15/2020   End of Session - 10/15/20 1723    Visit Number 11    Number of Visits 17    Date for SLP Re-Evaluation 12/08/20    Authorization Type BCBS    SLP Start Time 1700    SLP Stop Time  0712    SLP Time Calculation (min) 45 min    Activity Tolerance Patient tolerated treatment well           Past Medical History:  Diagnosis Date  . CAD (coronary artery disease) of bypass graft    CABG x 4  . Glaucoma   . H/O: CVA (cerebrovascular accident) 2022  . Hyperlipidemia LDL goal <70   . Hypertension     Past Surgical History:  Procedure Laterality Date  . CORONARY ARTERY BYPASS GRAFT N/A 07/08/2020   Procedure: CORONARY ARTERY BYPASS GRAFTING (CABG), ON PUMP, TIMES FOUR, LEFT INTERNAL MAMMARY ARTERY TO LAD, RIGHT SVG TO PDA, DISTAL CIRCUMFLEX, AND OM1;  Surgeon: Leonard Isaac, MD;  Location: [facility];  Service: Open Heart Surgery;  Laterality: N/A;  . ENDOVEIN HARVEST OF GREATER SAPHENOUS VEIN Right 07/08/2020   Procedure: ENDOVEIN HARVEST OF RIGHT GREATER SAPHENOUS VEIN;  Surgeon: Leonard Isaac, MD;  Location: [facility];  Service: Open Heart Surgery;  Laterality: Right;  . LEFT HEART CATH AND CORONARY ANGIOGRAPHY N/A 07/02/2020   Procedure: LEFT HEART CATH AND CORONARY ANGIOGRAPHY;  Surgeon: Leonard Booze, MD;  Location: [facility];  Service: Cardiovascular;  Laterality: N/A;  . NO PAST SURGERIES    . TEE WITHOUT CARDIOVERSION N/A 07/08/2020   Procedure: TRANSESOPHAGEAL ECHOCARDIOGRAM (TEE);  Surgeon: Leonard Isaac, MD;  Location: [facility];  Service: Open Heart Surgery;  Laterality: N/A;    There were no vitals filed for this  visit.   Subjective Assessment - 10/15/20 1702    Subjective "I'm doing okay"    Patient is accompained by: Family member   wife   Currently in Pain? Yes    Pain Score 4     Pain Location Shoulder    Pain Orientation Left                 ADULT SLP TREATMENT - 10/15/20 1703      General Information   Behavior/Cognition Alert;Cooperative;Pleasant mood;Distractible      Treatment Provided   Treatment provided Cognitive-Linquistic      Cognitive-Linquistic Treatment   Treatment focused on Dysarthria;Aphasia;Cognition;Patient/family/caregiver education    Skilled Treatment SLP reviewed synonym/opposite HEP hwk, in which pt required usual mod A to determine appropriate synonyms. Carrier phrases and fill in blank were beneficial to improve word finding. SLP targeted connected speech tasks to generate improvements for targeted items. Pt usually stated "I don't know" and required mod A to ID possible solutions, which may be indicative of word finding or cognition. Reduced speech intellgibility also noted during challenging tasks. Pt reports no recent difficulty with swallowing since last ST session.      Assessment / Recommendations / Plan   Plan Continue with current plan of care      Progression Toward Goals   Progression toward goals Progressing toward goals  SLP Education - 10/15/20 1842    Education Details word finding compensations, awareness    Person(s) Educated Patient;Spouse    Methods Explanation;Demonstration;Handout    Comprehension Verbalized understanding;Returned demonstration;Need further instruction            SLP Short Term Goals - 10/13/20 1722      SLP SHORT TERM GOAL #1   Title Pt will verbalize functional application of 2 memory/attention strategies for home/work environments with occasional min A over 2 sessions    Baseline 10-06-20    Period Weeks   or 9 total visits for all STGs   Status Partially Met      SLP SHORT TERM GOAL #2    Title Pt will demonstrate speech compensations for 100% intelligibility in 10 simple conversation with rare min A over 2 sessions    Baseline 10-06-20    Status Partially Met      SLP SHORT TERM GOAL #3   Title Pt will verbalize and demonstrate recommended swallow strategies in therapy and at home with min A over 2 sessions    Baseline 09-29-20, 10-08-20    Status Achieved      SLP SHORT TERM GOAL #4   Title Pt will be able to verbalize three non-physical deficits by giving examples from home or within ST session in 2 sessions    Baseline 09-29-20    Status Partially Met            SLP Long Term Goals - 10/15/20 1723      SLP LONG TERM GOAL #1   Title Pt will verbalize functional application of 3 memory/attention strategies for home/work environments with rare min A over 2 sessions    Time 4    Period Weeks   or 17 total visits for all LTGs   Status On-going      SLP LONG TERM GOAL #2   Title Pt will demonstrate speech compensations for 100% intelligibility in 15 mod complex conversation with rare min A over 2 sessions    Time 4    Period Weeks    Status On-going      SLP LONG TERM GOAL #3   Title Pt and wife will report consistent carryover of recommended swallow strategies and reduced s/sx of aspiration over 3 sessions    Baseline 10-15-20    Time 4    Period Weeks    Status On-going      SLP LONG TERM GOAL #4   Title Pt will demonstrate improved insight and awareness of current deficits and impact on his performance with both work and home environments with rare min A over 2 sessions    Time 4    Period Weeks    Status On-going            Plan - 10/15/20 1843    Clinical Impression Statement Leonard Donovan is seen for ST intervention to address mild cognitive communication impairment and minimal dysphagia s/p CVA in February 2021. Adequate speech intelligibility noted while reading answers on structured tasks with occasional cues to slow rate of speech for improved  intelligibility. Reduced speech intelligibility exhibited during discussion of structured topics. Increased difficulty generating appropriate answers to open ended scenarios exhibited, which may be indicative of word finding or cognition. See "skilled treatment" for additional details of today's session. Pt would benefit from skilled ST intervention to address mild dysarthria, mild cognitive communication deficits, and mild oral dysphagia to aid patient in return to PLOF and work, reduce communication breakdown,  and decrease aspiration risk.    Speech Therapy Frequency 2x / week    Duration 8 weeks   or 17 total visits   Treatment/Interventions Compensatory strategies;Patient/family education;Functional tasks;Cueing hierarchy;Multimodal communcation approach;Cognitive reorganization;Environmental controls;Diet toleration management by SLP;Aspiration precaution training;Compensatory techniques;Internal/external aids;SLP instruction and feedback    Potential to Achieve Goals Fair    Potential Considerations Cooperation/participation level;Previous level of function    SLP Home Exercise Plan provided    Consulted and Agree with Plan of Care Patient           Patient will benefit from skilled therapeutic intervention in order to improve the following deficits and impairments:   Cognitive communication deficit  Dysarthria and anarthria  Aphasia    Problem List Patient Active Problem List   Diagnosis Date Noted  . Orthostatic syncope 08/06/2020  . CVA (cerebral vascular accident) ([facility]) 07/21/2020  . Acute CVA (cerebrovascular accident) ([facility]) 07/20/2020  . Glaucoma   . Hypertension   . S/P CABG x 4   . Coronary artery disease 07/08/2020  . NSTEMI (non-ST elevated myocardial infarction) ([facility]) 07/02/2020  . Hypertensive emergency 07/01/2020    Alinda Deem, [place] CCC-SLP 10/15/2020, 6:46 PM  [facility] [address] [place], [place], [address] Phone: [phone]   Fax:  [phone]   Name: Leonard Donovan MRN: [id number] Date of Birth: 1965/06/25

## 2020-10-15 NOTE — Therapy (Signed)
[facility] [address] [place], [place], [address] Phone: [phone]   Fax:  [phone]  Occupational Therapy Treatment  Patient Details  Name: Leonard Donovan MRN: [id number] Date of Birth: [birth year] Referring Provider (OT): Ihor Austin   Encounter Date: 10/15/2020   OT End of Session - 10/15/20 1848    Visit Number 11    Number of Visits 17    Date for OT Re-Evaluation 11/23/20    Authorization Type BCBS:  [id number]    OT Start Time 1747    OT Stop Time 1830    OT Time Calculation (min) 43 min    Activity Tolerance Patient limited by pain    Behavior During Therapy [facility] for tasks assessed/performed           Past Medical History:  Diagnosis Date  . CAD (coronary artery disease) of bypass graft    CABG x 4  . Glaucoma   . H/O: CVA (cerebrovascular accident) 2022  . Hyperlipidemia LDL goal <70   . Hypertension     Past Surgical History:  Procedure Laterality Date  . CORONARY ARTERY BYPASS GRAFT N/A 07/08/2020   Procedure: CORONARY ARTERY BYPASS GRAFTING (CABG), ON PUMP, TIMES FOUR, LEFT INTERNAL MAMMARY ARTERY TO LAD, RIGHT SVG TO PDA, DISTAL CIRCUMFLEX, AND OM1;  Surgeon: Delight Ovens, MD;  Location[facility];  Service: Open Heart Surgery;  Laterality: N/A;  . ENDOVEIN HARVEST OF GREATER SAPHENOUS VEIN Right 07/08/2020   Procedure: ENDOVEIN HARVEST OF RIGHT GREATER SAPHENOUS VEIN;  Surgeon: Delight Ovens, MD;  Location: [facility];  Service: Open Heart Surgery;  Laterality: Right;  . LEFT HEART CATH AND CORONARY ANGIOGRAPHY N/A 07/02/2020   Procedure: LEFT HEART CATH AND CORONARY ANGIOGRAPHY;  Surgeon: Corky Crafts, MD;  Location: [facility];  Service: Cardiovascular;  Laterality: N/A;  . NO PAST SURGERIES    . TEE WITHOUT CARDIOVERSION N/A 07/08/2020   Procedure: TRANSESOPHAGEAL ECHOCARDIOGRAM (TEE);  Surgeon: Delight Ovens, MD;  Location: [facility];  Service: Open Heart Surgery;  Laterality:  N/A;    There were no vitals filed for this visit.   Subjective Assessment - 10/15/20 1840    Subjective  Patient reports 3-4/10    Patient is accompanied by: Family member    Currently in Pain? Yes    Pain Score 4     Pain Location Shoulder    Pain Orientation Left    Pain Descriptors / Indicators Aching    Pain Type Chronic pain    Pain Onset More than a month ago    Pain Frequency Constant    Aggravating Factors  movement- open chain, hanging in dependent position    Pain Relieving Factors heat or ice                        OT Treatments/Exercises (OP) - 10/15/20 0001      Neurological Re-education Exercises   Other Exercises 1 Neuromuscular reeducation to address postural control as related to upper extremity movement.  Seated on physioball to address body on arm stretching to regain shoulder range and alleviate pain.  Standing roling ball on elevated table, or on floor.  Patient most comfortable in forward flexed posture with UE's in support.      Moist Heat Therapy   Number Minutes Moist Heat 12 Minutes    Moist Heat Location Shoulder  OT Short Term Goals - 10/15/20 1849      OT SHORT TERM GOAL #1   Title Patient will complete an HEP designed to improve grip strength in left hand    Time 4    Period Weeks    Status Achieved      OT SHORT TERM GOAL #2   Title Patient will complete an HEP designed to improve range of motion and decrease pain in left shoulder    Time 4    Period Weeks    Status Achieved      OT SHORT TERM GOAL #3   Title Patient will report greater ease in  washing lower legs and donning socks.    Time 4    Period Weeks    Status Achieved   pt reports greater ease. 09/22/20     OT SHORT TERM GOAL #4   Title Patient will report improved ability to get onto and off of lawn mower (management of left leg)    Time 4    Period Weeks    Status On-going             OT Long Term Goals - 10/15/20 1850       OT LONG TERM GOAL #1   Title Patient will complete an updated HEP to address LUE functioning    Time 8    Period Weeks    Status On-going      OT LONG TERM GOAL #2   Title Patient will demonstrate 5 lb increase in left grip strength to improve functional grasp    Time 4    Period Weeks    Status Achieved   40lbs with LUE 10/06/20     OT LONG TERM GOAL #3   Title Patient will reach overhead to obtain and place an object no heavier than 2 lbs x 3 without increased pain.    Time 8    Period Weeks    Status On-going      OT LONG TERM GOAL #4   Title Patient and wife will demonstrate understanding of return to driving recommendations    Time 8    Period Weeks    Status On-going      OT LONG TERM GOAL #5   Title Patient and wife will demonstrate understanding of return to work recommendations    Time 8    Period Weeks    Status On-going                 Plan - 10/15/20 1849    Clinical Impression Statement Pt continues to consistently report pain in LUE shoulder, even at rest and with gentle ROM. Pt scheduled for xray tomorrow after talking with PCP today.    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 ADL;Coordination;Endurance;GMC;Muscle spasms;UE functional use;Vestibular;Decreased knowledge of precautions;Balance;Body mechanics;Decreased knowledge of use of DME;Flexibility;IADL;Pain;Strength;FMC;Dexterity;Cardiopulmonary status limiting activity;Mobility;ROM;Tone    Cognitive Skills Attention;Emotional;Energy/Drive;Learn;Memory;Temperament/Personality;Sequencing;Safety Awareness;Problem Solve;Perception    Rehab Potential Good    Clinical Decision Making Several treatment  options, min-mod task modification necessary    Comorbidities Affecting Occupational Performance: May have comorbidities impacting occupational performance    Modification or Assistance to Complete Evaluation  Min-Moderate modification of tasks or assist with assess necessary to complete eval  OT Frequency 2x / week    OT Duration 8 weeks    OT Treatment/Interventions Self-care/ADL training;Electrical Stimulation;Therapeutic exercise;Patient/family education;Splinting;Neuromuscular education;Paraffin;Moist Heat;Aquatic Therapy;Fluidtherapy;Energy conservation;Building services engineer;Therapeutic activities;Balance training;Cryotherapy;Ultrasound;Contrast Bath;DME and/or AE instruction;Manual Therapy;Passive range of motion;Cognitive remediation/compensation    Plan continue addressing shoulder pain - XRAY results?    Consulted and Agree with Plan of Care Patient;Family member/caregiver    Family Member Consulted Wife Carla           Patient will benefit from skilled therapeutic intervention in order to improve the following deficits and impairments:   Body Structure / Function / Physical Skills: ADL,Coordination,Endurance,GMC,Muscle spasms,UE functional use,Vestibular,Decreased knowledge of precautions,Balance,Body mechanics,Decreased knowledge of use of DME,Flexibility,IADL,Pain,Strength,FMC,Dexterity,Cardiopulmonary status limiting activity,Mobility,ROM,Tone Cognitive Skills: Attention,Emotional,Energy/Drive,Learn,Memory,Temperament/Personality,Sequencing,Safety Awareness,Problem Solve,Perception     Visit Diagnosis: Hemiplegia and hemiparesis following cerebral infarction affecting left non-dominant side (HCC)  Attention and concentration deficit  Acute pain of left shoulder  Muscle weakness (generalized)  Unsteadiness on feet    Problem List Patient Active Problem List   Diagnosis Date Noted  . Orthostatic syncope 08/06/2020  . CVA (cerebral vascular accident) ([facility])  07/21/2020  . Acute CVA (cerebrovascular accident) ([facility]) 07/20/2020  . Glaucoma   . Hypertension   . S/P CABG x 4   . Coronary artery disease 07/08/2020  . NSTEMI (non-ST elevated myocardial infarction) ([facility]) 07/02/2020  . Hypertensive emergency 07/01/2020    Collier Salina, OTR/L 10/15/2020, 6:50 PM  [facility] [address] [place], [place], [address] Phone: [phone]   Fax:  [phone]  Name: Leonard Donovan MRN: [id number] Date of Birth: 1965-06-22

## 2020-10-16 ENCOUNTER — Encounter: Payer: Self-pay | Admitting: Cardiology

## 2020-10-16 ENCOUNTER — Ambulatory Visit (INDEPENDENT_AMBULATORY_CARE_PROVIDER_SITE_OTHER): Payer: BC Managed Care – PPO | Admitting: Cardiology

## 2020-10-16 VITALS — BP 144/100 | HR 64 | Ht 69.0 in | Wt 224.0 lb

## 2020-10-16 DIAGNOSIS — I25810 Atherosclerosis of coronary artery bypass graft(s) without angina pectoris: Secondary | ICD-10-CM

## 2020-10-16 DIAGNOSIS — E7800 Pure hypercholesterolemia, unspecified: Secondary | ICD-10-CM | POA: Diagnosis not present

## 2020-10-16 DIAGNOSIS — I10 Essential (primary) hypertension: Secondary | ICD-10-CM | POA: Diagnosis not present

## 2020-10-16 MED ORDER — CARVEDILOL 6.25 MG PO TABS: 6.2500 mg | ORAL_TABLET | Freq: Two times a day (BID) | ORAL | 3 refills | Status: DC

## 2020-10-16 MED ORDER — CHLORTHALIDONE 25 MG PO TABS: 25.0000 mg | ORAL_TABLET | Freq: Every day | ORAL | 3 refills | Status: DC

## 2020-10-16 NOTE — Patient Instructions (Signed)
Medication Instructions:   STOP METOPROLOL  START CARVEDILOL 6.25 MG ONE TABLET TWICE DAILY  START CHLORTHALIDONE 25 MG ONCE DAILY  *If you need a refill on your cardiac medications before your next appointment, please call your pharmacy*   Lab Work:  Your physician recommends that you return for lab work in: ONE [facility]  If you have labs (blood work) drawn today and your tests are completely normal, you will receive your results only by: Marland Kitchen MyChart Message (if you have MyChart) OR . A paper copy in the mail If you have any lab test that is abnormal or we need to change your treatment, we will call you to review the results.   Follow-Up: At [facility], you and your health needs are our priority.  As part of our continuing mission to provide you with exceptional heart care, we have created designated Provider Care Teams.  These Care Teams include your primary Cardiologist (physician) and Advanced Practice Providers (APPs -  Physician Assistants and Nurse Practitioners) who all work together to provide you with the care you need, when you need it.  We recommend signing up for the patient portal called "MyChart".  Sign up information is provided on this After Visit Summary.  MyChart is used to connect with patients for Virtual Visits (Telemedicine).  Patients are able to view lab/test results, encounter notes, upcoming appointments, etc.  Non-urgent messages can be sent to your provider as well.   To learn more about what you can do with MyChart, go to [email or web address].    Your next appointment:   8 week(s)  The format for your next appointment:   In Person  Provider:   You will see one of the following Advanced Practice Providers on your designated Care Team:    Marjie Skiff, PA-C  Edd Fabian, FNP  Then, [name], MD will plan to see you again in 4 month(s).

## 2020-10-20 ENCOUNTER — Ambulatory Visit: Payer: BC Managed Care – PPO

## 2020-10-20 ENCOUNTER — Ambulatory Visit: Payer: BC Managed Care – PPO | Admitting: Occupational Therapy

## 2020-10-20 ENCOUNTER — Other Ambulatory Visit: Payer: Self-pay

## 2020-10-20 ENCOUNTER — Encounter: Payer: Self-pay | Admitting: Occupational Therapy

## 2020-10-20 DIAGNOSIS — R1310 Dysphagia, unspecified: Secondary | ICD-10-CM

## 2020-10-20 DIAGNOSIS — I69354 Hemiplegia and hemiparesis following cerebral infarction affecting left non-dominant side: Secondary | ICD-10-CM | POA: Diagnosis not present

## 2020-10-20 DIAGNOSIS — R41841 Cognitive communication deficit: Secondary | ICD-10-CM

## 2020-10-20 DIAGNOSIS — R2681 Unsteadiness on feet: Secondary | ICD-10-CM

## 2020-10-20 DIAGNOSIS — R41840 Attention and concentration deficit: Secondary | ICD-10-CM

## 2020-10-20 DIAGNOSIS — R4701 Aphasia: Secondary | ICD-10-CM

## 2020-10-20 DIAGNOSIS — M6281 Muscle weakness (generalized): Secondary | ICD-10-CM

## 2020-10-20 DIAGNOSIS — M25512 Pain in left shoulder: Secondary | ICD-10-CM

## 2020-10-20 NOTE — Therapy (Signed)
[facility] [address] [place], [place], [address] Phone: [phone]   Fax:  [phone]  Occupational Therapy Treatment  Patient Details  Name: Leonard Donovan MRN: [id number] Date of Birth: 18-Sep-1965 Referring Provider (OT): Ihor Austin   Encounter Date: 10/20/2020   OT End of Session - 10/20/20 1315    Visit Number 12    Number of Visits 17    Date for OT Re-Evaluation 11/23/20    Authorization Type BCBS:  VL:MN    OT Start Time 1230    OT Stop Time 1315    OT Time Calculation (min) 45 min    Activity Tolerance Patient limited by pain;Patient tolerated treatment well           Past Medical History:  Diagnosis Date  . CAD (coronary artery disease) of bypass graft    CABG x 4  . Glaucoma   . H/O: CVA (cerebrovascular accident) 2022  . Hyperlipidemia LDL goal <70   . Hypertension     Past Surgical History:  Procedure Laterality Date  . CORONARY ARTERY BYPASS GRAFT N/A 07/08/2020   Procedure: CORONARY ARTERY BYPASS GRAFTING (CABG), ON PUMP, TIMES FOUR, LEFT INTERNAL MAMMARY ARTERY TO LAD, RIGHT SVG TO PDA, DISTAL CIRCUMFLEX, AND OM1;  Surgeon: Delight Ovens, MD;  Location: [facility] OR;  Service: Open Heart Surgery;  Laterality: N/A;  . ENDOVEIN HARVEST OF GREATER SAPHENOUS VEIN Right 07/08/2020   Procedure: ENDOVEIN HARVEST OF RIGHT GREATER SAPHENOUS VEIN;  Surgeon: Delight Ovens, MD;  Location: [facility] OR;  Service: Open Heart Surgery;  Laterality: Right;  . LEFT HEART CATH AND CORONARY ANGIOGRAPHY N/A 07/02/2020   Procedure: LEFT HEART CATH AND CORONARY ANGIOGRAPHY;  Surgeon: Corky Crafts, MD;  Location: [facility] INVASIVE CV LAB;  Service: Cardiovascular;  Laterality: N/A;  . NO PAST SURGERIES    . TEE WITHOUT CARDIOVERSION N/A 07/08/2020   Procedure: TRANSESOPHAGEAL ECHOCARDIOGRAM (TEE);  Surgeon: Delight Ovens, MD;  Location: [facility] OR;  Service: Open Heart Surgery;  Laterality: N/A;    There were no  vitals filed for this visit.   Subjective Assessment - 10/20/20 1234    Subjective  Patient is waiting for call to schedule ortho MD appointment    Currently in Pain? Yes    Pain Score 2     Pain Location Shoulder    Pain Orientation Left    Pain Descriptors / Indicators Aching    Pain Type Chronic pain    Pain Onset More than a month ago    Pain Frequency Constant    Aggravating Factors  movement- open chain    Pain Relieving Factors heat                        OT Treatments/Exercises (OP) - 10/20/20 0001      Neurological Re-education Exercises   Other Exercises 1 Neuromuscular reeducation to address shoulder girdle and rib/cage trunk mobility  Worked initially seated on physioball, then transitioned to standing with weight through BUE to tolerance.  Worked in supine rolling toward left and right.  Provided slight traction to left shoulder with significant result for pain relief.      Moist Heat Therapy   Number Minutes Moist Heat 6 Minutes    Moist Heat Location Shoulder                    OT Short Term Goals - 10/20/20 1401  OT SHORT TERM GOAL #1   Title Patient will complete an HEP designed to improve grip strength in left hand    Time 4    Period Weeks    Status Achieved      OT SHORT TERM GOAL #2   Title Patient will complete an HEP designed to improve range of motion and decrease pain in left shoulder    Time 4    Period Weeks    Status Achieved      OT SHORT TERM GOAL #3   Title Patient will report greater ease in  washing lower legs and donning socks.    Time 4    Period Weeks    Status Achieved   pt reports greater ease. 09/22/20     OT SHORT TERM GOAL #4   Title Patient will report improved ability to get onto and off of lawn mower (management of left leg)    Time 4    Period Weeks    Status On-going             OT Long Term Goals - 10/20/20 1401      OT LONG TERM GOAL #1   Title Patient will complete an updated HEP  to address LUE functioning    Time 8    Period Weeks    Status On-going      OT LONG TERM GOAL #2   Title Patient will demonstrate 5 lb increase in left grip strength to improve functional grasp    Time 4    Period Weeks    Status Achieved   40lbs with LUE 10/06/20     OT LONG TERM GOAL #3   Title Patient will reach overhead to obtain and place an object no heavier than 2 lbs x 3 without increased pain.    Time 8    Period Weeks    Status On-going      OT LONG TERM GOAL #4   Title Patient and wife will demonstrate understanding of return to driving recommendations    Time 8    Period Weeks    Status On-going      OT LONG TERM GOAL #5   Title Patient and wife will demonstrate understanding of return to work recommendations    Time 8    Period Weeks    Status On-going                 Plan - 10/20/20 1315    Clinical Impression Statement Pt had xray without any findings and has been referred to ortho MD.    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 ADL;Coordination;Endurance;GMC;Muscle spasms;UE functional use;Vestibular;Decreased knowledge of precautions;Balance;Body mechanics;Decreased knowledge of use of DME;Flexibility;IADL;Pain;Strength;FMC;Dexterity;Cardiopulmonary status limiting activity;Mobility;ROM;Tone    Cognitive Skills Attention;Emotional;Energy/Drive;Learn;Memory;Temperament/Personality;Sequencing;Safety Awareness;Problem Solve;Perception    Rehab Potential Good    Clinical Decision Making Several treatment options, min-mod task modification necessary    Comorbidities Affecting Occupational Performance: May have comorbidities impacting occupational performance     Modification or Assistance to Complete Evaluation  Min-Moderate modification of tasks or assist with assess necessary to complete eval    OT Frequency 2x / week    OT Duration 8 weeks    OT Treatment/Interventions Self-care/ADL training;Electrical Stimulation;Therapeutic exercise;Patient/family education;Splinting;Neuromuscular education;Paraffin;Moist Heat;Aquatic  Therapy;Fluidtherapy;Energy conservation;Building services engineer;Therapeutic activities;Balance training;Cryotherapy;Ultrasound;Contrast Bath;DME and/or AE instruction;Manual Therapy;Passive range of motion;Cognitive remediation/compensation    Plan continue addressing shoulder pain - XRAY results?    Consulted and Agree with Plan of Care Patient;Family member/caregiver    Family Member Consulted Wife Carla           Patient will benefit from skilled therapeutic intervention in order to improve the following deficits and impairments:   Body Structure / Function / Physical Skills: ADL,Coordination,Endurance,GMC,Muscle spasms,UE functional use,Vestibular,Decreased knowledge of precautions,Balance,Body mechanics,Decreased knowledge of use of DME,Flexibility,IADL,Pain,Strength,FMC,Dexterity,Cardiopulmonary status limiting activity,Mobility,ROM,Tone Cognitive Skills: Attention,Emotional,Energy/Drive,Learn,Memory,Temperament/Personality,Sequencing,Safety Awareness,Problem Solve,Perception     Visit Diagnosis: Hemiplegia and hemiparesis following cerebral infarction affecting left non-dominant side (HCC)  Acute pain of left shoulder  Muscle weakness (generalized)  Attention and concentration deficit  Unsteadiness on feet    Problem List Patient Active Problem List   Diagnosis Date Noted  . Orthostatic syncope 08/06/2020  . CVA (cerebral vascular accident) (HCC) 07/21/2020  . Acute CVA (cerebrovascular accident) (HCC) 07/20/2020  . Glaucoma   . Hypertension   . S/P CABG x 4   . Coronary artery disease 07/08/2020  .  NSTEMI (non-ST elevated myocardial infarction) (HCC) 07/02/2020  . Hypertensive emergency 07/01/2020    Collier Salina , OTR/L 10/20/2020, 2:02 PM  [facility] [address] [place], [place], [address] Phone: [phone]   Fax:  [phone]  Name: EBBIE CHERRY MRN: [id number] Date of Birth: Mar 08, 1966

## 2020-10-20 NOTE — Patient Instructions (Addendum)
Track every episode of "coughing or choking." Write down what you think caused it.              Track every episode of word finding, slurred speech, or decreased attention/focus  

## 2020-10-20 NOTE — Therapy (Signed)
[facility] [address] [place], [place], [address] Phone: [phone]   Fax:  [phone]  Speech Language Pathology Treatment  Patient Details  Name: Leonard Donovan MRN: [id number] Date of Birth: July 19, 1965 Referring Provider (SLP): Frann Rider NP   Encounter Date: 10/20/2020   End of Session - 10/20/20 1245    Visit Number 12    Number of Visits 17    Date for SLP Re-Evaluation 12/08/20    Authorization Type BCBS    SLP Start Time 1219    SLP Stop Time  1400    SLP Time Calculation (min) 45 min    Activity Tolerance Patient tolerated treatment well           Past Medical History:  Diagnosis Date  . CAD (coronary artery disease) of bypass graft    CABG x 4  . Glaucoma   . H/O: CVA (cerebrovascular accident) 2022  . Hyperlipidemia LDL goal <70   . Hypertension     Past Surgical History:  Procedure Laterality Date  . CORONARY ARTERY BYPASS GRAFT N/A 07/08/2020   Procedure: CORONARY ARTERY BYPASS GRAFTING (CABG), ON PUMP, TIMES FOUR, LEFT INTERNAL MAMMARY ARTERY TO LAD, RIGHT SVG TO PDA, DISTAL CIRCUMFLEX, AND OM1;  Surgeon: Grace Isaac, MD;  Location: [facility];  Service: Open Heart Surgery;  Laterality: N/A;  . ENDOVEIN HARVEST OF GREATER SAPHENOUS VEIN Right 07/08/2020   Procedure: ENDOVEIN HARVEST OF RIGHT GREATER SAPHENOUS VEIN;  Surgeon: Grace Isaac, MD;  Location: [facility];  Service: Open Heart Surgery;  Laterality: Right;  . LEFT HEART CATH AND CORONARY ANGIOGRAPHY N/A 07/02/2020   Procedure: LEFT HEART CATH AND CORONARY ANGIOGRAPHY;  Surgeon: Jettie Booze, MD;  Location: [place] CV LAB;  Service: Cardiovascular;  Laterality: N/A;  . NO PAST SURGERIES    . TEE WITHOUT CARDIOVERSION N/A 07/08/2020   Procedure: TRANSESOPHAGEAL ECHOCARDIOGRAM (TEE);  Surgeon: Grace Isaac, MD;  Location: [facility];  Service: Open Heart Surgery;  Laterality: N/A;    There were no vitals filed for this  visit.   Subjective Assessment - 10/20/20 1305    Currently in Pain? Yes    Pain Score 1     Pain Location Shoulder    Pain Orientation Left                 ADULT SLP TREATMENT - 10/20/20 1245      General Information   Behavior/Cognition Alert;Cooperative;Pleasant mood;Distractible      Treatment Provided   Treatment provided Cognitive-Linquistic;Dysphagia      Dysphagia Treatment   Treatment Methods Skilled observation;Compensation strategy training;Patient/caregiver education    Patient observed directly with PO's Yes    Type of PO's observed Thin liquids    Feeding Able to feed self    Liquids provided via Cup    Oral Phase Signs & Symptoms Oral holding    Type of cueing Verbal    Amount of cueing Modified independent    Other treatment/comments Pt reported episodes x3 of "choking" with drinks. Pt indicated being distracted x1. SLP re-educated patient on volitional oral hold (1-2-3-swallow), in which pt demo'd with min cues. No overt s/sx of aspiration exhibited. SLP recommends patient continue to track coughing/"choking" episodes to heighten awareness and increase carryover of strategies.      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 Pt noted with word finding x1 in discussion of HEP. SLP targeted word finding on structured task, with occasional cues to describe items with consistent characteristic. Pt endorsed he gets "stuck" in his thinking, indicating reduced mental flexability.      Assessment / Recommendations / Plan   Plan Continue with current plan of care      Progression Toward Goals   Progression toward goals Progressing toward goals            SLP Education - 10/20/20 1405    Education Details awareness, mental  flexability, swallow compensations    Person(s) Educated Patient    Methods Explanation;Demonstration;Handout    Comprehension Verbalized understanding;Returned demonstration;Need further instruction            SLP Short Term Goals - 10/13/20 1722      SLP SHORT TERM GOAL #1   Title Pt will verbalize functional application of 2 memory/attention strategies for home/work environments with occasional min A over 2 sessions    Baseline 10-06-20    Period Weeks   or 9 total visits for all STGs   Status Partially Met      SLP SHORT TERM GOAL #2   Title Pt will demonstrate speech compensations for 100% intelligibility in 10 simple conversation with rare min A over 2 sessions    Baseline 10-06-20    Status Partially Met      SLP SHORT TERM GOAL #3   Title Pt will verbalize and demonstrate recommended swallow strategies in therapy and at home with min A over 2 sessions    Baseline 09-29-20, 10-08-20    Status Achieved      SLP SHORT TERM GOAL #4   Title Pt will be able to verbalize three non-physical deficits by giving examples from home or within ST session in 2 sessions    Baseline 09-29-20    Status Partially Met            SLP Long Term Goals - 10/20/20 1245      SLP LONG TERM GOAL #1   Title Pt will verbalize functional application of 3 memory/attention strategies for home/work environments with rare min A over 2 sessions    Time 3    Period Weeks   or 17 total visits for all LTGs   Status On-going      SLP LONG TERM GOAL #2   Title Pt will demonstrate speech compensations for 100% intelligibility in 15 mod complex conversation with rare min A over 2 sessions    Time 3    Period Weeks    Status On-going      SLP LONG TERM GOAL #3   Title Pt and wife will report consistent carryover of recommended swallow strategies and reduced s/sx of aspiration over 3 sessions    Baseline 10-15-20    Time 3    Period Weeks    Status On-going      SLP LONG TERM GOAL #4   Title Pt will  demonstrate improved insight and awareness of current deficits and impact on his performance with both work and home environments with rare min A over 2 sessions    Baseline 10-20-20    Time 3    Period Weeks    Status On-going            Plan - 10/20/20 1405    Clinical Impression Statement Leonard Donovan is seen for ST intervention to address mild cognitive communication  impairment and minimal dysphagia s/p CVA in February 2021. Occasional word finding and coughing reported given HEP task to track episodes for increased awareness. SLP continues to target carryover of compensations for speech and swallow, awareness of deficits, and attention to tasks/instructions. Pt would benefit from skilled ST intervention to address mild dysarthria, mild cognitive communication deficits, and mild oral dysphagia to aid patient in return to PLOF and work, reduce communication breakdown, and decrease aspiration risk.    Speech Therapy Frequency 2x / week    Duration 8 weeks   or 17 total visits   Treatment/Interventions Compensatory strategies;Patient/family education;Functional tasks;Cueing hierarchy;Multimodal communcation approach;Cognitive reorganization;Environmental controls;Diet toleration management by SLP;Aspiration precaution training;Compensatory techniques;Internal/external aids;SLP instruction and feedback    Potential to Achieve Goals Fair    Potential Considerations Cooperation/participation level;Previous level of function    SLP Home Exercise Plan provided    Consulted and Agree with Plan of Care Patient           Patient will benefit from skilled therapeutic intervention in order to improve the following deficits and impairments:   Cognitive communication deficit  Aphasia  Dysphagia, unspecified type    Problem List Patient Active Problem List   Diagnosis Date Noted  . Orthostatic syncope 08/06/2020  . CVA (cerebral vascular accident) ([place]) 07/21/2020  . Acute CVA (cerebrovascular  accident) ([place]) 07/20/2020  . Glaucoma   . Hypertension   . S/P CABG x 4   . Coronary artery disease 07/08/2020  . NSTEMI (non-ST elevated myocardial infarction) ([place]) 07/02/2020  . Hypertensive emergency 07/01/2020    Alinda Deem, MA CCC-SLP 10/20/2020, 2:08 PM  [facility] [address] [place], [place], [address] Phone: [phone]   Fax:  [phone]   Name: Leonard Donovan MRN: [id number] Date of Birth: December 06, 1965

## 2020-10-22 ENCOUNTER — Other Ambulatory Visit: Payer: Self-pay

## 2020-10-22 ENCOUNTER — Ambulatory Visit: Payer: BC Managed Care – PPO | Admitting: Occupational Therapy

## 2020-10-22 ENCOUNTER — Ambulatory Visit: Payer: BC Managed Care – PPO

## 2020-10-22 DIAGNOSIS — R471 Dysarthria and anarthria: Secondary | ICD-10-CM

## 2020-10-22 DIAGNOSIS — R41841 Cognitive communication deficit: Secondary | ICD-10-CM

## 2020-10-22 DIAGNOSIS — I69354 Hemiplegia and hemiparesis following cerebral infarction affecting left non-dominant side: Secondary | ICD-10-CM | POA: Diagnosis not present

## 2020-10-22 DIAGNOSIS — R4701 Aphasia: Secondary | ICD-10-CM

## 2020-10-22 NOTE — Therapy (Signed)
[facility] [address] [place], [place], [address] Phone: [phone]   Fax:  [phone]  Speech Language Pathology Treatment  Patient Details  Name: Leonard Donovan MRN: [id number] Date of Birth: 1966/01/11 Referring Provider (SLP): Leonard Rider NP   Encounter Date: 10/22/2020   End of Session - 10/22/20 1059    Visit Number 13    Number of Visits 17    Date for SLP Re-Evaluation 12/08/20    Authorization Type BCBS    SLP Start Time 1100    SLP Stop Time  1145    SLP Time Calculation (min) 45 min    Activity Tolerance Patient tolerated treatment well           Past Medical History:  Diagnosis Date  . CAD (coronary artery disease) of bypass graft    CABG x 4  . Glaucoma   . H/O: CVA (cerebrovascular accident) 2022  . Hyperlipidemia LDL goal <70   . Hypertension     Past Surgical History:  Procedure Laterality Date  . CORONARY ARTERY BYPASS GRAFT N/A 07/08/2020   Procedure: CORONARY ARTERY BYPASS GRAFTING (CABG), ON PUMP, TIMES FOUR, LEFT INTERNAL MAMMARY ARTERY TO LAD, RIGHT SVG TO PDA, DISTAL CIRCUMFLEX, AND OM1;  Surgeon: Grace Isaac, MD;  Location: [facility];  Service: Open Heart Surgery;  Laterality: N/A;  . ENDOVEIN HARVEST OF GREATER SAPHENOUS VEIN Right 07/08/2020   Procedure: ENDOVEIN HARVEST OF RIGHT GREATER SAPHENOUS VEIN;  Surgeon: Grace Isaac, MD;  Location: [facility];  Service: Open Heart Surgery;  Laterality: Right;  . LEFT HEART CATH AND CORONARY ANGIOGRAPHY N/A 07/02/2020   Procedure: LEFT HEART CATH AND CORONARY ANGIOGRAPHY;  Surgeon: Jettie Booze, MD;  Location: [facility];  Service: Cardiovascular;  Laterality: N/A;  . NO PAST SURGERIES    . TEE WITHOUT CARDIOVERSION N/A 07/08/2020   Procedure: TRANSESOPHAGEAL ECHOCARDIOGRAM (TEE);  Surgeon: Grace Isaac, MD;  Location: [facility];  Service: Open Heart Surgery;  Laterality: N/A;    There were no vitals filed for this  visit.   Subjective Assessment - 10/22/20 1101    Subjective "fine"    Currently in Pain? Yes    Pain Score 1     Pain Location Shoulder                 ADULT SLP TREATMENT - 10/22/20 1059      General Information   Behavior/Cognition Alert;Cooperative;Pleasant mood;Distractible      Treatment Provided   Treatment provided Cognitive-Linquistic      Cognitive-Linquistic Treatment   Treatment focused on Dysarthria;Aphasia;Cognition;Patient/family/caregiver education    Skilled Treatment Pt demonstrates increasing awareness related to current comm/swallow deficits, as pt wrote down 2 episodes of difficulty swallowing (pills particularly), 2 episodes of slurred speech, and 2 episodes of word finding. SLP engaged patient in problem solving task to improve carryover of compensations at home, in which pt able to verbalize solutions with usual mod prompting. No overt word finding noted in conversation. SLP targeted use of dysathria compensations on structured tasks, including slow rate, over-ennunication, and volume, with occasional cues required due to reduced intelligiblity x2. SLP suspects awareness, attention, and/or recall impacts carryover at home versus when pt is focused on compensations and strategies in ST session. Of note, pt reports limited engagement at home and indicated reduced motivation, including returning to work. SLP recommended pt f/u with MD if feelings persist.      Assessment / Recommendations / Plan  Plan Continue with current plan of care      Progression Toward Goals   Progression toward goals Progressing toward goals            SLP Education - 10/22/20 1251    Education Details awareness, carryover of compensations at home    Person(s) Educated Patient    Methods Explanation;Demonstration;Handout    Comprehension Verbalized understanding;Returned demonstration;Need further instruction            SLP Short Term Goals - 10/13/20 1722      SLP  SHORT TERM GOAL #1   Title Pt will verbalize functional application of 2 memory/attention strategies for home/work environments with occasional min A over 2 sessions    Baseline 10-06-20    Period Weeks   or 9 total visits for all STGs   Status Partially Met      SLP SHORT TERM GOAL #2   Title Pt will demonstrate speech compensations for 100% intelligibility in 10 simple conversation with rare min A over 2 sessions    Baseline 10-06-20    Status Partially Met      SLP SHORT TERM GOAL #3   Title Pt will verbalize and demonstrate recommended swallow strategies in therapy and at home with min A over 2 sessions    Baseline 09-29-20, 10-08-20    Status Achieved      SLP SHORT TERM GOAL #4   Title Pt will be able to verbalize three non-physical deficits by giving examples from home or within ST session in 2 sessions    Baseline 09-29-20    Status Partially Met            SLP Long Term Goals - 10/22/20 1059      SLP LONG TERM GOAL #1   Title Pt will verbalize functional application of 3 memory/attention strategies for home/work environments with rare min A over 2 sessions    Baseline 10-22-20    Time 3    Period Weeks   or 17 total visits for all LTGs   Status On-going      SLP LONG TERM GOAL #2   Title Pt will demonstrate speech compensations for 100% intelligibility in 15 mod complex conversation with rare min A over 2 sessions    Time 3    Period Weeks    Status On-going      SLP LONG TERM GOAL #3   Title Pt and wife will report consistent carryover of recommended swallow strategies and reduced s/sx of aspiration over 3 sessions    Baseline 10-15-20    Time 3    Period Weeks    Status On-going      SLP LONG TERM GOAL #4   Title Pt will demonstrate improved insight and awareness of current deficits and impact on his performance with both work and home environments with rare min A over 2 sessions    Baseline 10-20-20    Time 3    Period Weeks    Status On-going             Plan - 10/22/20 1253    Clinical Impression Statement Leonard Donovan is seen for ST intervention to address mild cognitive communication impairment and minimal dysphagia s/p CVA in February 2021. Occasional word finding, slurred speech, and coughing during PO intake reported given HEP task to track episodes for increased awareness. SLP continues to target carryover of compensations for speech and swallow, awareness of deficits, and attention to tasks/instructions. Pt would benefit from skilled  ST intervention to address mild dysarthria, mild cognitive communication deficits, and mild oral dysphagia to aid patient in return to PLOF and work, reduce communication breakdown, and decrease aspiration risk.    Speech Therapy Frequency 2x / week    Duration 8 weeks   or 17 total visits   Treatment/Interventions Compensatory strategies;Patient/family education;Functional tasks;Cueing hierarchy;Multimodal communcation approach;Cognitive reorganization;Environmental controls;Diet toleration management by SLP;Aspiration precaution training;Compensatory techniques;Internal/external aids;SLP instruction and feedback    Potential to Achieve Goals Fair    Potential Considerations Cooperation/participation level;Previous level of function    SLP Home Exercise Plan provided    Consulted and Agree with Plan of Care Patient           Patient will benefit from skilled therapeutic intervention in order to improve the following deficits and impairments:   Dysarthria and anarthria  Aphasia  Cognitive communication deficit    Problem List Patient Active Problem List   Diagnosis Date Noted  . Orthostatic syncope 08/06/2020  . CVA (cerebral vascular accident) ([facility]) 07/21/2020  . Acute CVA (cerebrovascular accident) ([facility]) 07/20/2020  . Glaucoma   . Hypertension   . S/P CABG x 4   . Coronary artery disease 07/08/2020  . NSTEMI (non-ST elevated myocardial infarction) ([place]) 07/02/2020  . Hypertensive emergency 07/01/2020     Alinda Deem, [place] CCC-SLP 10/22/2020, 1:00 PM  [facility] [address] [place], [place], [address] Phone: [phone]   Fax:  [phone]   Name: Leonard Donovan MRN: [id number] Date of Birth: 26-May-1966

## 2020-10-22 NOTE — Patient Instructions (Addendum)
  Try talking your pills in small amounts (2-3 at a time versus all at a time). You need to focus on what you're doing, slow down, and think "1-2-3 swallow."    If you are slurring your words, remember: slow down, raise volume one notch, and move mouth bigger.    You need to double check your work (ex: make sure all ingredients are in your recipe, etc).

## 2020-10-27 ENCOUNTER — Ambulatory Visit: Payer: BC Managed Care – PPO | Admitting: Occupational Therapy

## 2020-10-27 ENCOUNTER — Ambulatory Visit: Payer: BC Managed Care – PPO

## 2020-10-27 ENCOUNTER — Other Ambulatory Visit: Payer: Self-pay

## 2020-10-27 DIAGNOSIS — R4701 Aphasia: Secondary | ICD-10-CM

## 2020-10-27 DIAGNOSIS — M6281 Muscle weakness (generalized): Secondary | ICD-10-CM

## 2020-10-27 DIAGNOSIS — R2681 Unsteadiness on feet: Secondary | ICD-10-CM

## 2020-10-27 DIAGNOSIS — R471 Dysarthria and anarthria: Secondary | ICD-10-CM

## 2020-10-27 DIAGNOSIS — R41841 Cognitive communication deficit: Secondary | ICD-10-CM

## 2020-10-27 DIAGNOSIS — I69354 Hemiplegia and hemiparesis following cerebral infarction affecting left non-dominant side: Secondary | ICD-10-CM | POA: Diagnosis not present

## 2020-10-27 DIAGNOSIS — M25512 Pain in left shoulder: Secondary | ICD-10-CM

## 2020-10-27 DIAGNOSIS — R41840 Attention and concentration deficit: Secondary | ICD-10-CM

## 2020-10-27 NOTE — Therapy (Signed)
[facility] [address] [place], [place], [address] Phone: [phone]   Fax:  [phone]  Speech Language Pathology Treatment  Patient Details  Name: BROGHAN PANNONE MRN: [id number] Date of Birth: [birth year] Referring Provider (SLP): Frann Rider NP   Encounter Date: 10/27/2020   End of Session - 10/27/20 1726    Visit Number 14    Number of Visits 17    Date for SLP Re-Evaluation 12/08/20    Authorization Type BCBS    SLP Start Time 1700    SLP Stop Time  3361    SLP Time Calculation (min) 45 min    Activity Tolerance Patient tolerated treatment well           Past Medical History:  Diagnosis Date  . CAD (coronary artery disease) of bypass graft    CABG x 4  . Glaucoma   . H/O: CVA (cerebrovascular accident) 2022  . Hyperlipidemia LDL goal <70   . Hypertension     Past Surgical History:  Procedure Laterality Date  . CORONARY ARTERY BYPASS GRAFT N/A 07/08/2020   Procedure: CORONARY ARTERY BYPASS GRAFTING (CABG), ON PUMP, TIMES FOUR, LEFT INTERNAL MAMMARY ARTERY TO LAD, RIGHT SVG TO PDA, DISTAL CIRCUMFLEX, AND OM1;  Surgeon: Grace Isaac, MD;  Location: [facility];  Service: Open Heart Surgery;  Laterality: N/A;  . ENDOVEIN HARVEST OF GREATER SAPHENOUS VEIN Right 07/08/2020   Procedure: ENDOVEIN HARVEST OF RIGHT GREATER SAPHENOUS VEIN;  Surgeon: Grace Isaac, MD;  Location: [facility];  Service: Open Heart Surgery;  Laterality: Right;  . LEFT HEART CATH AND CORONARY ANGIOGRAPHY N/A 07/02/2020   Procedure: LEFT HEART CATH AND CORONARY ANGIOGRAPHY;  Surgeon: Jettie Booze, MD;  Location: [facility];  Service: Cardiovascular;  Laterality: N/A;  . NO PAST SURGERIES    . TEE WITHOUT CARDIOVERSION N/A 07/08/2020   Procedure: TRANSESOPHAGEAL ECHOCARDIOGRAM (TEE);  Surgeon: Grace Isaac, MD;  Location: [facility];  Service: Open Heart Surgery;  Laterality: N/A;    There were no vitals filed for this  visit.   Subjective Assessment - 10/27/20 1703    Subjective "Im fine"    Currently in Pain? Yes    Pain Score 1     Pain Location Shoulder                 ADULT SLP TREATMENT - 10/27/20 1701      General Information   Behavior/Cognition Alert;Cooperative;Pleasant mood;Distractible      Treatment Provided   Treatment provided Cognitive-Linquistic      Cognitive-Linquistic Treatment   Treatment focused on Dysarthria;Aphasia;Cognition;Patient/family/caregiver education    Skilled Treatment Pt completed HEP targeting awareness, in which pt reported coughing episode x1 with thin liquids, word finding x1, and slurred speech x1 over weekend. SLP re-educated patient on increasing volume, slowing rate of speech, and emphasizing each words with occasional cues required to demo while reading structured tasks. Occasional cues required to verbally ID similiarities and differences. Pt reports double checking without prompting; however, reduced mental flexability demonstrated.      Assessment / Recommendations / Plan   Plan Continue with current plan of care      Progression Toward Goals   Progression toward goals Progressing toward goals            SLP Education - 10/27/20 1728    Education Details compensations, carryover, functional practice    Person(s) Educated Patient    Methods Explanation;Demonstration    Comprehension  Verbalized understanding;Returned demonstration            SLP Short Term Goals - 10/13/20 1722      SLP SHORT TERM GOAL #1   Title Pt will verbalize functional application of 2 memory/attention strategies for home/work environments with occasional min A over 2 sessions    Baseline 10-06-20    Period Weeks   or 9 total visits for all STGs   Status Partially Met      SLP SHORT TERM GOAL #2   Title Pt will demonstrate speech compensations for 100% intelligibility in 10 simple conversation with rare min A over 2 sessions    Baseline 10-06-20    Status  Partially Met      SLP SHORT TERM GOAL #3   Title Pt will verbalize and demonstrate recommended swallow strategies in therapy and at home with min A over 2 sessions    Baseline 09-29-20, 10-08-20    Status Achieved      SLP SHORT TERM GOAL #4   Title Pt will be able to verbalize three non-physical deficits by giving examples from home or within ST session in 2 sessions    Baseline 09-29-20    Status Partially Met            SLP Long Term Goals - 10/27/20 1728      SLP LONG TERM GOAL #1   Title Pt will verbalize functional application of 3 memory/attention strategies for home/work environments with rare min A over 2 sessions    Baseline 10-22-20    Time 2    Period Weeks   or 17 total visits for all LTGs   Status On-going      SLP LONG TERM GOAL #2   Title Pt will demonstrate speech compensations for 100% intelligibility in 15 mod complex conversation with rare min A over 2 sessions    Time 2    Period Weeks    Status On-going      SLP LONG TERM GOAL #3   Title Pt and wife will report consistent carryover of recommended swallow strategies and reduced s/sx of aspiration over 3 sessions    Baseline 10-15-20    Time 2    Period Weeks    Status On-going      SLP LONG TERM GOAL #4   Title Pt will demonstrate improved insight and awareness of current deficits and impact on his performance with both work and home environments with rare min A over 2 sessions    Baseline 10-20-20    Time 2    Period Weeks    Status On-going            Plan - 10/27/20 1746    Clinical Impression Statement Dorell is seen for ST intervention to address mild cognitive communication impairment and minimal dysphagia s/p CVA in February 2021. Occasional word finding, slurred speech, and coughing during PO intake reported given HEP task to track episodes for increased awareness. SLP continues to target carryover of compensations for speech and swallow, awareness of deficits, and attention to  tasks/instructions. Pt would benefit from skilled ST intervention to address mild dysarthria, mild cognitive communication deficits, and mild oral dysphagia to aid patient in return to PLOF and work, reduce communication breakdown, and decrease aspiration risk.    Speech Therapy Frequency 2x / week    Duration 8 weeks   or 17 total visits   Treatment/Interventions Compensatory strategies;Patient/family education;Functional tasks;Cueing hierarchy;Multimodal communcation approach;Cognitive reorganization;Environmental controls;Diet toleration management by SLP;Aspiration  precaution training;Compensatory techniques;Internal/external aids;SLP instruction and feedback    Potential to Achieve Goals Fair    Potential Considerations Cooperation/participation level;Previous level of function    SLP Home Exercise Plan provided    Consulted and Agree with Plan of Care Patient           Patient will benefit from skilled therapeutic intervention in order to improve the following deficits and impairments:   Dysarthria and anarthria  Aphasia  Cognitive communication deficit    Problem List Patient Active Problem List   Diagnosis Date Noted  . Orthostatic syncope 08/06/2020  . CVA (cerebral vascular accident) ([place]) 07/21/2020  . Acute CVA (cerebrovascular accident) ([place]) 07/20/2020  . Glaucoma   . Hypertension   . S/P CABG x 4   . Coronary artery disease 07/08/2020  . NSTEMI (non-ST elevated myocardial infarction) ([place]) 07/02/2020  . Hypertensive emergency 07/01/2020    Alinda Deem, [place] CCC-SLP 10/27/2020, 6:35 PM  [facility] [address] [place], [place], [address] Phone: [phone]   Fax:  [phone]   Name: FESTUS PURSEL MRN: [id number] Date of Birth: 1966-02-16

## 2020-10-27 NOTE — Therapy (Signed)
[facility] [address] [place], [place], [address] Phone: [phone]   Fax:  [phone]  Occupational Therapy Treatment  Patient Details  Name: Leonard Donovan MRN: [id number] Date of Birth: December 08, 1965 Referring Provider (OT): Ihor Austin   Encounter Date: 10/27/2020   OT End of Session - 10/27/20 1747    Visit Number 13    Number of Visits 17    Date for OT Re-Evaluation 11/23/20    Authorization Type BCBS:  [id number]    OT Start Time 1745    OT Stop Time 1830    OT Time Calculation (min) 45 min    Activity Tolerance Patient limited by pain;Patient tolerated treatment well           Past Medical History:  Diagnosis Date  . CAD (coronary artery disease) of bypass graft    CABG x 4  . Glaucoma   . H/O: CVA (cerebrovascular accident) 2022  . Hyperlipidemia LDL goal <70   . Hypertension     Past Surgical History:  Procedure Laterality Date  . CORONARY ARTERY BYPASS GRAFT N/A 07/08/2020   Procedure: CORONARY ARTERY BYPASS GRAFTING (CABG), ON PUMP, TIMES FOUR, LEFT INTERNAL MAMMARY ARTERY TO LAD, RIGHT SVG TO PDA, DISTAL CIRCUMFLEX, AND OM1;  Surgeon: Delight Ovens, MD;  Location: [facility];  Service: Open Heart Surgery;  Laterality: N/A;  . ENDOVEIN HARVEST OF GREATER SAPHENOUS VEIN Right 07/08/2020   Procedure: ENDOVEIN HARVEST OF RIGHT GREATER SAPHENOUS VEIN;  Surgeon: Delight Ovens, MD;  [facility];  Service: Open Heart Surgery;  Laterality: Right;  . LEFT HEART CATH AND CORONARY ANGIOGRAPHY N/A 07/02/2020   Procedure: LEFT HEART CATH AND CORONARY ANGIOGRAPHY;  Surgeon: Corky Crafts, MD;  Location: [facility];  Service: Cardiovascular;  Laterality: N/A;  . NO PAST SURGERIES    . TEE WITHOUT CARDIOVERSION N/A 07/08/2020   Procedure: TRANSESOPHAGEAL ECHOCARDIOGRAM (TEE);  Surgeon: Delight Ovens, MD;  Location: [facility];  Service: Open Heart Surgery;  Laterality: N/A;    There were no  vitals filed for this visit.   Subjective Assessment - 10/27/20 1747    Subjective  Pt received cortisone shot in LUE shoulder this morning - recheck in 1 month.    Currently in Pain? Yes    Pain Score 1     Pain Location Shoulder    Pain Orientation Left    Pain Descriptors / Indicators Aching    Pain Type Chronic pain    Pain Onset More than a month ago    Pain Frequency Constant             LUE stretching and AAROM for shoulder. Pt with min pain with movements with increased pain reported with horizontal abduction across body, and abduction and external rotation. Pt responded well to traction and UE ranger.  Pt had MD appt this morning and MD reports possibly bursitis in LUE shoulder vs rotator cuff. Pt received cortisone shot in LUE shoulder this morning but reports no improvement as of yet.          OT Short Term Goals - 10/20/20 1401      OT SHORT TERM GOAL #1   Title Patient will complete an HEP designed to improve grip strength in left hand    Time 4    Period Weeks    Status Achieved      OT SHORT TERM GOAL #2   Title Patient will complete an HEP  designed to improve range of motion and decrease pain in left shoulder    Time 4    Period Weeks    Status Achieved      OT SHORT TERM GOAL #3   Title Patient will report greater ease in  washing lower legs and donning socks.    Time 4    Period Weeks    Status Achieved   pt reports greater ease. 09/22/20     OT SHORT TERM GOAL #4   Title Patient will report improved ability to get onto and off of lawn mower (management of left leg)    Time 4    Period Weeks    Status On-going             OT Long Term Goals - 10/20/20 1401      OT LONG TERM GOAL #1   Title Patient will complete an updated HEP to address LUE functioning    Time 8    Period Weeks    Status On-going      OT LONG TERM GOAL #2   Title Patient will demonstrate 5 lb increase in left grip strength to improve functional grasp    Time 4     Period Weeks    Status Achieved   40lbs with LUE 10/06/20     OT LONG TERM GOAL #3   Title Patient will reach overhead to obtain and place an object no heavier than 2 lbs x 3 without increased pain.    Time 8    Period Weeks    Status On-going      OT LONG TERM GOAL #4   Title Patient and wife will demonstrate understanding of return to driving recommendations    Time 8    Period Weeks    Status On-going      OT LONG TERM GOAL #5   Title Patient and wife will demonstrate understanding of return to work recommendations    Time 8    Period Weeks    Status On-going                 Plan - 10/27/20 1833    Clinical Impression Statement Pt received cortisone shot in LUE shoulder this morning. Continue to see if cortisone helps with pain.    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 ADL;Coordination;Endurance;GMC;Muscle spasms;UE functional use;Vestibular;Decreased knowledge of precautions;Balance;Body mechanics;Decreased knowledge of use of DME;Flexibility;IADL;Pain;Strength;FMC;Dexterity;Cardiopulmonary status limiting activity;Mobility;ROM;Tone    Cognitive Skills Attention;Emotional;Energy/Drive;Learn;Memory;Temperament/Personality;Sequencing;Safety Awareness;Problem Solve;Perception    Rehab Potential Good    Clinical Decision Making Several treatment options, min-mod task modification necessary    Comorbidities Affecting Occupational Performance: May have comorbidities impacting occupational performance    Modification or Assistance to Complete Evaluation  Min-Moderate modification of tasks or assist with assess necessary to complete eval    OT Frequency 2x / week    OT Duration  8 weeks    OT Treatment/Interventions Self-care/ADL training;Electrical Stimulation;Therapeutic exercise;Patient/family education;Splinting;Neuromuscular education;Paraffin;Moist Heat;Aquatic Therapy;Fluidtherapy;Energy conservation;Building services engineer;Therapeutic activities;Balance training;Cryotherapy;Ultrasound;Contrast Bath;DME and/or AE instruction;Manual Therapy;Passive range of motion;Cognitive remediation/compensation    Plan continue addressing shoulder pain - check cortisone and improvement?    Consulted and Agree with Plan of Care Patient;Family member/caregiver    Family Member Consulted Wife Albin Felling  Patient will benefit from skilled therapeutic intervention in order to improve the following deficits and impairments:   Body Structure / Function / Physical Skills: ADL,Coordination,Endurance,GMC,Muscle spasms,UE functional use,Vestibular,Decreased knowledge of precautions,Balance,Body mechanics,Decreased knowledge of use of DME,Flexibility,IADL,Pain,Strength,FMC,Dexterity,Cardiopulmonary status limiting activity,Mobility,ROM,Tone Cognitive Skills: Attention,Emotional,Energy/Drive,Learn,Memory,Temperament/Personality,Sequencing,Safety Awareness,Problem Solve,Perception     Visit Diagnosis: Hemiplegia and hemiparesis following cerebral infarction affecting left non-dominant side (HCC)  Acute pain of left shoulder  Muscle weakness (generalized)  Attention and concentration deficit  Unsteadiness on feet    Problem List Patient Active Problem List   Diagnosis Date Noted  . Orthostatic syncope 08/06/2020  . CVA (cerebral vascular accident) ([facility]) 07/21/2020  . Acute CVA (cerebrovascular accident) ([facility]) 07/20/2020  . Glaucoma   . Hypertension   . S/P CABG x 4   . Coronary artery disease 07/08/2020  . NSTEMI (non-ST elevated myocardial infarction) ([facility]) 07/02/2020  . Hypertensive emergency 07/01/2020    Junious Dresser MOT, OTR/L  10/27/2020, 6:34  PM  [facility] [address] [place], [place], [address] Phone: [phone]   Fax:  [phone]  Name: Leonard Donovan MRN: [id number] Date of Birth: July 01, 1965

## 2020-10-27 NOTE — Patient Instructions (Signed)
Track every episode of "coughing or choking." Write down what you think caused it.              Track every episode of word finding, slurred speech, or decreased attention/focus

## 2020-10-28 LAB — LIPID PANEL
Chol/HDL Ratio: 4 ratio (ref 0.0–5.0)
Cholesterol, Total: 149 mg/dL (ref 100–199)
HDL: 37 mg/dL — ABNORMAL LOW (ref >39)
LDL Chol Calc (NIH): 89 mg/dL (ref 0–99)
Triglycerides: 131 mg/dL (ref 0–149)
VLDL Cholesterol Cal: 23 mg/dL (ref 5–40)

## 2020-10-28 LAB — COMPREHENSIVE METABOLIC PANEL
ALT: 10 IU/L (ref 0–44)
AST: 18 IU/L (ref 0–40)
Albumin/Globulin Ratio: 1.7 (ref 1.2–2.2)
Albumin: 4.5 g/dL (ref 3.8–4.9)
Alkaline Phosphatase: 130 IU/L — ABNORMAL HIGH (ref 44–121)
BUN/Creatinine Ratio: 18 (ref 9–20)
BUN: 27 mg/dL — ABNORMAL HIGH (ref 6–24)
Bilirubin Total: 0.6 mg/dL (ref 0.0–1.2)
CO2: 25 mmol/L (ref 20–29)
Calcium: 9.4 mg/dL (ref 8.7–10.2)
Chloride: 100 mmol/L (ref 96–106)
Creatinine, Ser: 1.47 mg/dL — ABNORMAL HIGH (ref 0.76–1.27)
Globulin, Total: 2.6 g/dL (ref 1.5–4.5)
Glucose: 103 mg/dL — ABNORMAL HIGH (ref 65–99)
Potassium: 4 mmol/L (ref 3.5–5.2)
Sodium: 141 mmol/L (ref 134–144)
Total Protein: 7.1 g/dL (ref 6.0–8.5)
eGFR: 57 mL/min/{1.73_m2} — ABNORMAL LOW (ref >59)

## 2020-10-29 ENCOUNTER — Ambulatory Visit: Payer: BC Managed Care – PPO | Attending: Adult Health | Admitting: Occupational Therapy

## 2020-10-29 ENCOUNTER — Encounter: Payer: Self-pay | Admitting: Occupational Therapy

## 2020-10-29 ENCOUNTER — Other Ambulatory Visit: Payer: Self-pay

## 2020-10-29 ENCOUNTER — Ambulatory Visit: Payer: BC Managed Care – PPO

## 2020-10-29 DIAGNOSIS — E7800 Pure hypercholesterolemia, unspecified: Secondary | ICD-10-CM

## 2020-10-29 DIAGNOSIS — R4701 Aphasia: Secondary | ICD-10-CM | POA: Diagnosis present

## 2020-10-29 DIAGNOSIS — Z79899 Other long term (current) drug therapy: Secondary | ICD-10-CM

## 2020-10-29 DIAGNOSIS — I69354 Hemiplegia and hemiparesis following cerebral infarction affecting left non-dominant side: Secondary | ICD-10-CM | POA: Insufficient documentation

## 2020-10-29 DIAGNOSIS — M25512 Pain in left shoulder: Secondary | ICD-10-CM | POA: Diagnosis present

## 2020-10-29 DIAGNOSIS — R2681 Unsteadiness on feet: Secondary | ICD-10-CM | POA: Insufficient documentation

## 2020-10-29 DIAGNOSIS — R41841 Cognitive communication deficit: Secondary | ICD-10-CM | POA: Insufficient documentation

## 2020-10-29 DIAGNOSIS — R41840 Attention and concentration deficit: Secondary | ICD-10-CM | POA: Insufficient documentation

## 2020-10-29 DIAGNOSIS — R471 Dysarthria and anarthria: Secondary | ICD-10-CM

## 2020-10-29 DIAGNOSIS — M6281 Muscle weakness (generalized): Secondary | ICD-10-CM

## 2020-10-29 MED ORDER — EZETIMIBE 10 MG PO TABS: 10.0000 mg | ORAL_TABLET | Freq: Every day | ORAL | 3 refills | Status: DC

## 2020-10-29 NOTE — Patient Instructions (Addendum)
   Aquatic Therapy: What to Expect!  Where:  [facility] at [facility] [address] [place], [place] [address] [phone]           How to Prepare: . Please make sure you drink 8 ounces of water about one hour prior to your pool session . A caregiver must attend the entire session with the patient (unless your primary therapists feels this is not necessary). The caregiver will be responsible for assisting with dressing as well as any toileting needs.  . Please arrive IN YOUR SUIT and a few minutes prior to your appointment - this helps to avoid delays in starting your session. . Please make sure to attend to any toileting needs prior to entering the pool . Once on the pool deck your therapist will ask you to sign the Patient  Consent and Assignment of Benefits form . Your therapist may take your blood pressure prior to, during and after your session if indicated . We usually try and create a home exercise program based on activities we do in the pool.  Please be thinking about who might be able to assist you in the pool should you want to participate in an aquatic home exercise program at the time of discharge.  Some patients do not want to or do not have the ability to participate in an aquatic home program - this is not a barrier in any way to you participating in aquatic therapy as part of your current therapy plan!    About the pool: 1. Entering the pool Your therapist will assist you; there are multiple ways to enter including stairs with railings, a walk in ramp, a roll in chair and a mechanical lift. Your therapist will determine the most appropriate way for you. 2. Water temperature is usually between 86-87 degrees 3. There may be other swimmers in the pool at the same time     Contact Info:             Appointments: [facility]         All sessions are 45 minutes   [address]  [address]            Please call the [facility] if   [place], [place]  [address]           you need to cancel or reschedule an appointment.  [phone]           Kerry Fort, PT    Bretta Bang, OTR/L    11/20/19   Access Code: [email or web address] URL: [email or web address] Date: 10/29/2020 Prepared by: Kallie Edward  Exercises Corner Pec Minor Stretch - 1 x daily - 7 x weekly - 3 sets - 10 reps Doorway Pec Stretch at 60 Degrees Abduction with Arm Straight - 1 x daily - 7 x weekly - 3 sets - 10 reps

## 2020-10-29 NOTE — Therapy (Signed)
[facility] [address] [place], [place], [address] Phone: [phone]   Fax:  [phone]  Occupational Therapy Treatment  Patient Details  Name: Leonard Donovan MRN: [id number] Date of Birth: Aug 01, 1965 Referring Provider (OT): Ihor Austin   Encounter Date: 10/29/2020   OT End of Session - 10/29/20 1746    Visit Number 14    Number of Visits 17    Date for OT Re-Evaluation 11/23/20    Authorization Type BCBS:  VL:MN    OT Start Time 1745    OT Stop Time 1825    OT Time Calculation (min) 40 min    Activity Tolerance Patient limited by pain;Patient tolerated treatment well           Past Medical History:  Diagnosis Date  . CAD (coronary artery disease) of bypass graft    CABG x 4  . Glaucoma   . H/O: CVA (cerebrovascular accident) 2022  . Hyperlipidemia LDL goal <70   . Hypertension     Past Surgical History:  Procedure Laterality Date  . CORONARY ARTERY BYPASS GRAFT N/A 07/08/2020   Procedure: CORONARY ARTERY BYPASS GRAFTING (CABG), ON PUMP, TIMES FOUR, LEFT INTERNAL MAMMARY ARTERY TO LAD, RIGHT SVG TO PDA, DISTAL CIRCUMFLEX, AND OM1;  Surgeon: Delight Ovens, MD;  Location: [facility] OR;  Service: Open Heart Surgery;  Laterality: N/A;  . ENDOVEIN HARVEST OF GREATER SAPHENOUS VEIN Right 07/08/2020   Procedure: ENDOVEIN HARVEST OF RIGHT GREATER SAPHENOUS VEIN;  Surgeon: Delight Ovens, MD;  Location: [facility] OR;  Service: Open Heart Surgery;  Laterality: Right;  . LEFT HEART CATH AND CORONARY ANGIOGRAPHY N/A 07/02/2020   Procedure: LEFT HEART CATH AND CORONARY ANGIOGRAPHY;  Surgeon: Corky Crafts, MD;  Location: [facility] INVASIVE CV LAB;  Service: Cardiovascular;  Laterality: N/A;  . NO PAST SURGERIES    . TEE WITHOUT CARDIOVERSION N/A 07/08/2020   Procedure: TRANSESOPHAGEAL ECHOCARDIOGRAM (TEE);  Surgeon: Delight Ovens, MD;  Location: [facility] OR;  Service: Open Heart Surgery;  Laterality: N/A;    There were no  vitals filed for this visit.   Subjective Assessment - 10/29/20 1752    Subjective  Pt reports pain has improved since cortisone shot in LUE - reports maybe a "1"    Currently in Pain? Yes    Pain Score 1     Pain Location Arm   arm and chest   Pain Orientation Left    Pain Descriptors / Indicators Aching    Pain Type Chronic pain    Pain Onset More than a month ago    Pain Frequency Constant            wall push ups x 10, wall slides with ball x 10, hemi glide horizontal abduction and external rotation      Access Code: [email or web address] URL: [email or web address] Date: 10/29/2020 Prepared by: Kallie Edward  Exercises Corner Pec Minor Stretch - 1 x daily - 7 x weekly - 3 sets - 10 reps Doorway Pec Stretch at 60 Degrees Abduction with Arm Straight - 1 x daily - 7 x weekly - 3 sets - 10 reps                  OT Education - 10/29/20 1821    Education Details stretches for pec - [id number], reviewed aquatic information    Person(s) Educated Patient    Methods Explanation;Demonstration;Handout    Comprehension Verbalized understanding;Returned demonstration  OT Short Term Goals - 10/20/20 1401      OT SHORT TERM GOAL #1   Title Patient will complete an HEP designed to improve grip strength in left hand    Time 4    Period Weeks    Status Achieved      OT SHORT TERM GOAL #2   Title Patient will complete an HEP designed to improve range of motion and decrease pain in left shoulder    Time 4    Period Weeks    Status Achieved      OT SHORT TERM GOAL #3   Title Patient will report greater ease in  washing lower legs and donning socks.    Time 4    Period Weeks    Status Achieved   pt reports greater ease. 09/22/20     OT SHORT TERM GOAL #4   Title Patient will report improved ability to get onto and off of lawn mower (management of left leg)    Time 4    Period Weeks    Status On-going             OT Long Term Goals -  10/20/20 1401      OT LONG TERM GOAL #1   Title Patient will complete an updated HEP to address LUE functioning    Time 8    Period Weeks    Status On-going      OT LONG TERM GOAL #2   Title Patient will demonstrate 5 lb increase in left grip strength to improve functional grasp    Time 4    Period Weeks    Status Achieved   40lbs with LUE 10/06/20     OT LONG TERM GOAL #3   Title Patient will reach overhead to obtain and place an object no heavier than 2 lbs x 3 without increased pain.    Time 8    Period Weeks    Status On-going      OT LONG TERM GOAL #4   Title Patient and wife will demonstrate understanding of return to driving recommendations    Time 8    Period Weeks    Status On-going      OT LONG TERM GOAL #5   Title Patient and wife will demonstrate understanding of return to work recommendations    Time 8    Period Weeks    Status On-going                 Plan - 10/29/20 1825    Clinical Impression Statement Pt progressing towards goals. Pt with decrease in pain today and overall increased relief with exericses.    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 ADL;Coordination;Endurance;GMC;Muscle spasms;UE functional use;Vestibular;Decreased knowledge of precautions;Balance;Body mechanics;Decreased knowledge of use of DME;Flexibility;IADL;Pain;Strength;FMC;Dexterity;Cardiopulmonary status limiting activity;Mobility;ROM;Tone    Cognitive Skills Attention;Emotional;Energy/Drive;Learn;Memory;Temperament/Personality;Sequencing;Safety Awareness;Problem Solve;Perception    Rehab Potential Good    Clinical Decision Making Several treatment options, min-mod task modification  necessary    Comorbidities Affecting Occupational Performance: May have comorbidities impacting occupational performance    Modification or Assistance to Complete Evaluation  Min-Moderate modification of tasks or assist with assess necessary to complete eval    OT Frequency 2x / week    OT  Duration 8 weeks    OT Treatment/Interventions Self-care/ADL training;Electrical Stimulation;Therapeutic exercise;Patient/family education;Splinting;Neuromuscular education;Paraffin;Moist Heat;Aquatic Therapy;Fluidtherapy;Energy conservation;Building services engineer;Therapeutic activities;Balance training;Cryotherapy;Ultrasound;Contrast Bath;DME and/or AE instruction;Manual Therapy;Passive range of motion;Cognitive remediation/compensation    Plan continue addressing shoulder pai, pool for shoulder    Consulted and Agree with Plan of Care Patient;Family member/caregiver    Family Member Consulted Wife Carla           Patient will benefit from skilled therapeutic intervention in order to improve the following deficits and impairments:   Body Structure / Function / Physical Skills: ADL,Coordination,Endurance,GMC,Muscle spasms,UE functional use,Vestibular,Decreased knowledge of precautions,Balance,Body mechanics,Decreased knowledge of use of DME,Flexibility,IADL,Pain,Strength,FMC,Dexterity,Cardiopulmonary status limiting activity,Mobility,ROM,Tone Cognitive Skills: Attention,Emotional,Energy/Drive,Learn,Memory,Temperament/Personality,Sequencing,Safety Awareness,Problem Solve,Perception     Visit Diagnosis: Unsteadiness on feet  Hemiplegia and hemiparesis following cerebral infarction affecting left non-dominant side (HCC)  Acute pain of left shoulder  Muscle weakness (generalized)  Attention and concentration deficit    Problem List Patient Active Problem List   Diagnosis Date Noted  . Orthostatic syncope 08/06/2020  . CVA (cerebral vascular accident) (HCC) 07/21/2020  . Acute CVA  (cerebrovascular accident) (HCC) 07/20/2020  . Glaucoma   . Hypertension   . S/P CABG x 4   . Coronary artery disease 07/08/2020  . NSTEMI (non-ST elevated myocardial infarction) (HCC) 07/02/2020  . Hypertensive emergency 07/01/2020    Junious Dresser MOT, OTR/L  10/29/2020, 6:27 PM  [facility] [address] [place], [place], [address] Phone: [phone]   Fax:  [phone]  Name: BUEL MOLDER MRN: [id number] Date of Birth: 08-29-1965

## 2020-10-29 NOTE — Therapy (Signed)
Leonard [facility] [address] [place], [place], [address] Phone: [phone]   Fax:  [phone]  Speech Language Pathology Treatment  Patient Details  Name: Leonard Donovan MRN: [id number] Date of Birth: 11/28/1965 Referring Provider (SLP): Frann Rider NP   Encounter Date: 10/29/2020   End of Session - 10/29/20 1719    Visit Number 15    Number of Visits 17    Date for SLP Re-Evaluation 12/08/20    Authorization Type BCBS    SLP Start Time 1700    SLP Stop Time  5956    SLP Time Calculation (min) 45 min    Activity Tolerance Patient tolerated treatment well           Past Medical History:  Diagnosis Date  . CAD (coronary artery disease) of bypass graft    CABG x 4  . Glaucoma   . H/O: CVA (cerebrovascular accident) 2022  . Hyperlipidemia LDL goal <70   . Hypertension     Past Surgical History:  Procedure Laterality Date  . CORONARY ARTERY BYPASS GRAFT N/A 07/08/2020   Procedure: CORONARY ARTERY BYPASS GRAFTING (CABG), ON PUMP, TIMES FOUR, LEFT INTERNAL MAMMARY ARTERY TO LAD, RIGHT SVG TO PDA, DISTAL CIRCUMFLEX, AND OM1;  Surgeon: Grace Isaac, MD;  Location: [facility];  Service: Open Heart Surgery;  Laterality: N/A;  . ENDOVEIN HARVEST OF GREATER SAPHENOUS VEIN Right 07/08/2020   Procedure: ENDOVEIN HARVEST OF RIGHT GREATER SAPHENOUS VEIN;  Surgeon: Grace Isaac, MD;  Location: [facility];  Service: Open Heart Surgery;  Laterality: Right;  . LEFT HEART CATH AND CORONARY ANGIOGRAPHY N/A 07/02/2020   Procedure: LEFT HEART CATH AND CORONARY ANGIOGRAPHY;  Surgeon: Jettie Booze, MD;  Location: [facility] CV LAB;  Service: Cardiovascular;  Laterality: N/A;  . NO PAST SURGERIES    . TEE WITHOUT CARDIOVERSION N/A 07/08/2020   Procedure: TRANSESOPHAGEAL ECHOCARDIOGRAM (TEE);  Surgeon: Grace Isaac, MD;  Location: [facility];  Service: Open Heart Surgery;  Laterality: N/A;    There were no vitals filed for this  visit.   Subjective Assessment - 10/29/20 1701    Subjective "I'm tired"    Currently in Pain? Yes    Pain Score 1     Pain Location Arm   and chest   Pain Orientation Left    Pain Descriptors / Indicators Aching    Pain Onset More than a month ago                 ADULT SLP TREATMENT - 10/29/20 1702      General Information   Behavior/Cognition Alert;Cooperative;Pleasant mood;Distractible      Treatment Provided   Treatment provided Cognitive-Linquistic      Cognitive-Linquistic Treatment   Treatment focused on Dysarthria;Aphasia;Cognition;Patient/family/caregiver education    Skilled Treatment Pt completed HEP targeting awareness, in which pt reported coughing episode x1 on salvia (while laying flat), word finding x1 (gestured to "treadmill" in PT), distracted x1 in church, and slurred speech x1 when fatigued. SLP provided intermittent cues this session to use dysarthria compensations due to reduced speech intelligibility while discussing HEP. Occasional cues required to increase specificness of answers and expand mental flexability on naming tasks. SLP re-educated pt on using description strategy if anomia occurs to cue self or listener. Naming descriptions added to HEP.      Assessment / Recommendations / Plan   Plan Continue with current plan of care      Progression Toward Goals  Progression toward goals Progressing toward goals            SLP Education - 10/29/20 1719    Education Details description anomia strategy    Person(s) Educated Patient    Methods Explanation;Demonstration;Handout    Comprehension Verbalized understanding;Returned demonstration;Need further instruction            SLP Short Term Goals - 10/13/20 1722      SLP SHORT TERM GOAL #1   Title Pt will verbalize functional application of 2 memory/attention strategies for home/work environments with occasional min A over 2 sessions    Baseline 10-06-20    Period Weeks   or 9 total visits  for all STGs   Status Partially Met      SLP SHORT TERM GOAL #2   Title Pt will demonstrate speech compensations for 100% intelligibility in 10 simple conversation with rare min A over 2 sessions    Baseline 10-06-20    Status Partially Met      SLP SHORT TERM GOAL #3   Title Pt will verbalize and demonstrate recommended swallow strategies in therapy and at home with min A over 2 sessions    Baseline 09-29-20, 10-08-20    Status Achieved      SLP SHORT TERM GOAL #4   Title Pt will be able to verbalize three non-physical deficits by giving examples from home or within ST session in 2 sessions    Baseline 09-29-20    Status Partially Met            SLP Long Term Goals - 10/29/20 1720      SLP LONG TERM GOAL #1   Title Pt will verbalize functional application of 3 memory/attention strategies for home/work environments with rare min A over 2 sessions    Baseline 10-22-20    Time 2    Period Weeks   or 17 total visits for all LTGs   Status On-going      SLP LONG TERM GOAL #2   Title Pt will demonstrate speech compensations for 100% intelligibility in 15 mod complex conversation with rare min A over 2 sessions    Time 2    Period Weeks    Status On-going      SLP LONG TERM GOAL #3   Title Pt and wife will report consistent carryover of recommended swallow strategies and reduced s/sx of aspiration over 3 sessions    Baseline 10-15-20    Time 2    Period Weeks    Status On-going      SLP LONG TERM GOAL #4   Title Pt will demonstrate improved insight and awareness of current deficits and impact on his performance with both work and home environments with rare min A over 2 sessions    Baseline 10-20-20    Time 2    Period Weeks    Status On-going            Plan - 10/29/20 1721    Clinical Impression Statement Leonard Donovan is seen for ST intervention to address mild cognitive communication impairment and minimal dysphagia s/p CVA in February 2021. Occasional word finding, slurred  speech, and coughing during PO intake reported given HEP task to track episodes for increased awareness. SLP continues to target carryover of compensations for speech and swallow, awareness of cog-comm deficits, and attention to tasks/instructions, with intermittent cues required to use compensations and improve mental flexability. Pt would benefit from skilled ST intervention to address mild dysarthria, mild cognitive  communication deficits, and mild oral dysphagia to aid patient in return to PLOF and work, reduce communication breakdown, and decrease aspiration risk.    Speech Therapy Frequency 2x / week    Duration 8 weeks   or 17 total visits   Treatment/Interventions Compensatory strategies;Patient/family education;Functional tasks;Cueing hierarchy;Multimodal communcation approach;Cognitive reorganization;Environmental controls;Diet toleration management by SLP;Aspiration precaution training;Compensatory techniques;Internal/external aids;SLP instruction and feedback    Potential to Achieve Goals Fair    Potential Considerations Cooperation/participation level;Previous level of function    SLP Home Exercise Plan provided    Consulted and Agree with Plan of Care Patient           Patient will benefit from skilled therapeutic intervention in order to improve the following deficits and impairments:   Aphasia  Dysarthria and anarthria  Cognitive communication deficit    Problem List Patient Active Problem List   Diagnosis Date Noted  . Orthostatic syncope 08/06/2020  . CVA (cerebral vascular accident) ([place]) 07/21/2020  . Acute CVA (cerebrovascular accident) ([place]) 07/20/2020  . Glaucoma   . Hypertension   . S/P CABG x 4   . Coronary artery disease 07/08/2020  . NSTEMI (non-ST elevated myocardial infarction) ([place]) 07/02/2020  . Hypertensive emergency 07/01/2020    Alinda Deem, MA CCC-SLP 10/29/2020, 5:35 PM  [facility] [address] [place], [place], [address] Phone: [phone]   Fax:  [phone]   Name: Leonard Donovan MRN: [id number] Date of Birth: [birth year]

## 2020-10-29 NOTE — Patient Instructions (Signed)
  Drink your own beverage to next session so you don't have to drink water   Make sure you "think before you drink." Meaning focus on what you are doing. Make sure you count "1-2-3 swallow"  If word finding occurs, try to describe the object to give yourself or your listener a clue

## 2020-10-30 ENCOUNTER — Other Ambulatory Visit: Payer: Self-pay | Admitting: *Deleted

## 2020-10-30 DIAGNOSIS — E7800 Pure hypercholesterolemia, unspecified: Secondary | ICD-10-CM

## 2020-10-30 DIAGNOSIS — Z79899 Other long term (current) drug therapy: Secondary | ICD-10-CM

## 2020-11-02 ENCOUNTER — Ambulatory Visit: Payer: Self-pay | Admitting: Occupational Therapy

## 2020-11-02 ENCOUNTER — Encounter: Payer: Self-pay | Admitting: Occupational Therapy

## 2020-11-02 DIAGNOSIS — R2681 Unsteadiness on feet: Secondary | ICD-10-CM | POA: Diagnosis not present

## 2020-11-02 NOTE — Therapy (Signed)
[facility] [address] [place], [place], [address] Phone: [phone]   Fax:  [phone]  Occupational Therapy Treatment  Patient Details  Name: Leonard Donovan MRN: [id number] Date of Birth: 06-Mar-1966 Referring Provider (OT): Ihor Austin   Encounter Date: 11/02/2020   OT End of Session - 11/02/20 1321    Visit Number 15    Number of Visits 17    Date for OT Re-Evaluation 11/23/20    Authorization Type BCBS:  VL:MN    OT Start Time 1220    OT Stop Time 1300    OT Time Calculation (min) 40 min    Activity Tolerance Patient tolerated treatment well    Behavior During Therapy [facility] for tasks assessed/performed           Past Medical History:  Diagnosis Date  . CAD (coronary artery disease) of bypass graft    CABG x 4  . Glaucoma   . H/O: CVA (cerebrovascular accident) 2022  . Hyperlipidemia LDL goal <70   . Hypertension     Past Surgical History:  Procedure Laterality Date  . CORONARY ARTERY BYPASS GRAFT N/A 07/08/2020   Procedure: CORONARY ARTERY BYPASS GRAFTING (CABG), ON PUMP, TIMES FOUR, LEFT INTERNAL MAMMARY ARTERY TO LAD, RIGHT SVG TO PDA, DISTAL CIRCUMFLEX, AND OM1;  Surgeon: Delight Ovens, MD;  Location: [facility] OR;  Service: Open Heart Surgery;  Laterality: N/A;  . ENDOVEIN HARVEST OF GREATER SAPHENOUS VEIN Right 07/08/2020   Procedure: ENDOVEIN HARVEST OF RIGHT GREATER SAPHENOUS VEIN;  Surgeon: Delight Ovens, MD;  Location: [facility] OR;  Service: Open Heart Surgery;  Laterality: Right;  . LEFT HEART CATH AND CORONARY ANGIOGRAPHY N/A 07/02/2020   Procedure: LEFT HEART CATH AND CORONARY ANGIOGRAPHY;  Surgeon: Corky Crafts, MD;  Location: [facility] INVASIVE CV LAB;  Service: Cardiovascular;  Laterality: N/A;  . NO PAST SURGERIES    . TEE WITHOUT CARDIOVERSION N/A 07/08/2020   Procedure: TRANSESOPHAGEAL ECHOCARDIOGRAM (TEE);  Surgeon: Delight Ovens, MD;  Location: [facility] OR;  Service: Open Heart Surgery;   Laterality: N/A;    There were no vitals filed for this visit.   Subjective Assessment - 11/02/20 1319    Subjective  Patient reports pain is returning, and caused difficulty with sleeping last night    Patient is accompanied by: Family member   2 kids   Currently in Pain? Yes    Pain Score 1     Pain Location Arm    Pain Descriptors / Indicators Aching    Pain Type Chronic pain    Pain Onset More than a month ago    Pain Frequency Intermittent    Aggravating Factors  movement    Pain Relieving Factors heat           Patient seen for first aquatic therapy visit.  Patient entered and exited pool via stairs with distant supervision.  Session occurred in 3.5-4.5 ft of water.   Patient entered watered and immediately reported reduction in shoulder ache.  Walked in squat position to allow more body underwater for max resistance, and support/buoyancy.  Worked with water dumb bells on top of water to support LUE.  Worked to improve active range of motion in pain free ranges.  Added resistance by working with dumb bells underwater for concentric and eccentric work of shoulders and elbows.  Worked to improve trunk rotation with standing rotation exercises.  Educated patient in some self stretch techniques on ladder railing to improve length  and strength in BUE.  Patient able to challenge himself and stay in pain free or minimally painful ranges.   Floatation - supine to address increase shoulder range of motion and to address head/shoulder/trunk relationship.                       OT Education - 11/02/20 1320    Education Details properties of water to assist with movement, resistance    Person(s) Educated Patient    Methods Explanation    Comprehension Other (comment)            OT Short Term Goals - 11/02/20 1323      OT SHORT TERM GOAL #1   Title Patient will complete an HEP designed to improve grip strength in left hand    Time 4    Period Weeks    Status  Achieved      OT SHORT TERM GOAL #2   Title Patient will complete an HEP designed to improve range of motion and decrease pain in left shoulder    Time 4    Period Weeks    Status Achieved      OT SHORT TERM GOAL #3   Title Patient will report greater ease in  washing lower legs and donning socks.    Time 4    Period Weeks    Status Achieved   pt reports greater ease. 09/22/20     OT SHORT TERM GOAL #4   Title Patient will report improved ability to get onto and off of lawn mower (management of left leg)    Time 4    Period Weeks    Status On-going             OT Long Term Goals - 11/02/20 1323      OT LONG TERM GOAL #1   Title Patient will complete an updated HEP to address LUE functioning    Time 8    Period Weeks    Status On-going      OT LONG TERM GOAL #2   Title Patient will demonstrate 5 lb increase in left grip strength to improve functional grasp    Time 4    Period Weeks    Status Achieved   40lbs with LUE 10/06/20     OT LONG TERM GOAL #3   Title Patient will reach overhead to obtain and place an object no heavier than 2 lbs x 3 without increased pain.    Time 8    Period Weeks    Status On-going      OT LONG TERM GOAL #4   Title Patient and wife will demonstrate understanding of return to driving recommendations    Time 8    Period Weeks    Status On-going      OT LONG TERM GOAL #5   Title Patient and wife will demonstrate understanding of return to work recommendations    Time 8    Period Weeks    Status On-going                 Plan - 11/02/20 1321    Clinical Impression Statement Pt with symptom relief in pool environment, able to address greater degrees of shoulder motion actively, and even light resistance without report of pain.    OT Occupational Profile and History Detailed Assessment- Review of Records and additional review of physical, cognitive, psychosocial history related to current functional performance    Occupational  performance deficits (Please refer to evaluation for details): ADL's;IADL's;Rest and Sleep;Work    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 ADL;Coordination;Endurance;GMC;Muscle spasms;UE functional use;Vestibular;Decreased knowledge of precautions;Balance;Body mechanics;Decreased knowledge of use of DME;Flexibility;IADL;Pain;Strength;FMC;Dexterity;Cardiopulmonary status limiting activity;Mobility;ROM;Tone    Cognitive Skills Attention;Emotional;Energy/Drive;Learn;Memory;Temperament/Personality;Sequencing;Safety Awareness;Problem Solve;Perception    Rehab Potential Good    Clinical Decision Making Several treatment options, min-mod task modification necessary    Comorbidities Affecting Occupational Performance: May have comorbidities impacting occupational performance    Modification or Assistance to Complete Evaluation  Min-Moderate modification of tasks or assist with assess necessary to complete eval    OT Frequency 2x / week    OT Duration 8 weeks    OT Treatment/Interventions Self-care/ADL training;Electrical Stimulation;Therapeutic exercise;Patient/family education;Splinting;Neuromuscular education;Paraffin;Moist Heat;Aquatic Therapy;Fluidtherapy;Energy conservation;Building services engineer;Therapeutic activities;Balance training;Cryotherapy;Ultrasound;Contrast Bath;DME and/or AE instruction;Manual Therapy;Passive range of motion;Cognitive remediation/compensation    Plan continue addressing shoulder pain, pool for shoulder    Consulted and Agree with Plan of Care Patient           Patient will benefit from skilled therapeutic intervention in order to improve the following deficits and impairments:   Body Structure / Function / Physical Skills: ADL,Coordination,Endurance,GMC,Muscle spasms,UE functional use,Vestibular,Decreased knowledge of precautions,Balance,Body mechanics,Decreased knowledge of use of DME,Flexibility,IADL,Pain,Strength,FMC,Dexterity,Cardiopulmonary status limiting  activity,Mobility,ROM,Tone Cognitive Skills: Attention,Emotional,Energy/Drive,Learn,Memory,Temperament/Personality,Sequencing,Safety Awareness,Problem Solve,Perception     Visit Diagnosis: Hemiplegia and hemiparesis following cerebral infarction affecting left non-dominant side (HCC)  Acute pain of left shoulder  Muscle weakness (generalized)  Unsteadiness on feet    Problem List Patient Active Problem List   Diagnosis Date Noted  . Orthostatic syncope 08/06/2020  . CVA (cerebral vascular accident) (HCC) 07/21/2020  . Acute CVA (cerebrovascular accident) (HCC) 07/20/2020  . Glaucoma   . Hypertension   . S/P CABG x 4   . Coronary artery disease 07/08/2020  . NSTEMI (non-ST elevated myocardial infarction) (HCC) 07/02/2020  . Hypertensive emergency 07/01/2020    Collier Salina 11/02/2020, 1:24 PM  [facility] [address] [place], [place], [address] Phone: [phone]   Fax:  [phone]  Name: Leonard Donovan MRN: [id number] Date of Birth: 02-Apr-1966

## 2020-11-03 ENCOUNTER — Ambulatory Visit: Payer: BC Managed Care – PPO | Admitting: Occupational Therapy

## 2020-11-03 ENCOUNTER — Other Ambulatory Visit: Payer: Self-pay

## 2020-11-03 ENCOUNTER — Ambulatory Visit: Payer: BC Managed Care – PPO

## 2020-11-03 DIAGNOSIS — R2681 Unsteadiness on feet: Secondary | ICD-10-CM | POA: Diagnosis not present

## 2020-11-03 DIAGNOSIS — R4701 Aphasia: Secondary | ICD-10-CM

## 2020-11-03 DIAGNOSIS — R41841 Cognitive communication deficit: Secondary | ICD-10-CM

## 2020-11-03 NOTE — Therapy (Signed)
[facility] [address] [place], [place], [address] Phone: [phone]   Fax:  [phone]  Speech Language Pathology Treatment  Patient Details  Name: Leonard Donovan MRN: [id number] Date of Birth: 05/18/1966 Referring Provider (SLP): Leonard Rider NP   Encounter Date: 11/03/2020   End of Session - 11/03/20 1524    Visit Number 16    Number of Visits 17    Date for SLP Re-Evaluation 12/08/20    Authorization Type BCBS    SLP Start Time 6063    SLP Stop Time  0160    SLP Time Calculation (min) 45 min    Activity Tolerance Patient tolerated treatment well           Past Medical History:  Diagnosis Date  . CAD (coronary artery disease) of bypass graft    CABG x 4  . Glaucoma   . H/O: CVA (cerebrovascular accident) 2022  . Hyperlipidemia LDL goal <70   . Hypertension     Past Surgical History:  Procedure Laterality Date  . CORONARY ARTERY BYPASS GRAFT N/A 07/08/2020   Procedure: CORONARY ARTERY BYPASS GRAFTING (CABG), ON PUMP, TIMES FOUR, LEFT INTERNAL MAMMARY ARTERY TO LAD, RIGHT SVG TO PDA, DISTAL CIRCUMFLEX, AND OM1;  Surgeon: Leonard Isaac, MD;  Location: [facility];  Service: Open Heart Surgery;  Laterality: N/A;  . ENDOVEIN HARVEST OF GREATER SAPHENOUS VEIN Right 07/08/2020   Procedure: ENDOVEIN HARVEST OF RIGHT GREATER SAPHENOUS VEIN;  Surgeon: Leonard Isaac, MD;  Location: [facility];  Service: Open Heart Surgery;  Laterality: Right;  . LEFT HEART CATH AND CORONARY ANGIOGRAPHY N/A 07/02/2020   Procedure: LEFT HEART CATH AND CORONARY ANGIOGRAPHY;  Surgeon: Leonard Booze, MD;  Location: [place] CV LAB;  Service: Cardiovascular;  Laterality: N/A;  . NO PAST SURGERIES    . TEE WITHOUT CARDIOVERSION N/A 07/08/2020   Procedure: TRANSESOPHAGEAL ECHOCARDIOGRAM (TEE);  Surgeon: Leonard Isaac, MD;  Location: [facility];  Service: Open Heart Surgery;  Laterality: N/A;    There were no vitals filed for this  visit.   Subjective Assessment - 11/03/20 1737    Subjective "I'm okay"    Currently in Pain? Yes    Pain Score 1     Pain Location Shoulder                 ADULT SLP TREATMENT - 11/03/20 1454      General Information   Behavior/Cognition Alert;Cooperative;Pleasant mood;Distractible      Treatment Provided   Treatment provided Cognitive-Linquistic      Cognitive-Linquistic Treatment   Treatment focused on Dysarthria;Aphasia;Cognition;Patient/family/caregiver education    Skilled Treatment Pt reports improving awareness of deficits, in which pt verbally recalled word finding episode x1 and loss of attention x1. SLP reviewed description naming tasks, with generic descriptions provided which did not reflect one specific item (ex: "to cut stuff"). Usual prompting required to verbalize additonal details, as pt stated "I can't think of anything else." SLP targeted comprehension of math language, in which pt completed with 75% accuracy. SLP provided min to mod cues to improve comprehension of task.      Assessment / Recommendations / Plan   Plan Continue with current plan of care      Progression Toward Goals   Progression toward goals Progressing toward goals   nearing baseline?           SLP Education - 11/03/20 1523    Education Details comparison of current level  to baseline, rationale for description strategy    Person(s) Educated Patient    Methods Explanation;Demonstration;Handout    Comprehension Verbalized understanding;Returned demonstration;Need further instruction            SLP Short Term Goals - 10/13/20 1722      SLP SHORT TERM GOAL #1   Title Pt will verbalize functional application of 2 memory/attention strategies for home/work environments with occasional min A over 2 sessions    Baseline 10-06-20    Period Weeks   or 9 total visits for all STGs   Status Partially Met      SLP SHORT TERM GOAL #2   Title Pt will demonstrate speech compensations for  100% intelligibility in 10 simple conversation with rare min A over 2 sessions    Baseline 10-06-20    Status Partially Met      SLP SHORT TERM GOAL #3   Title Pt will verbalize and demonstrate recommended swallow strategies in therapy and at home with min A over 2 sessions    Baseline 09-29-20, 10-08-20    Status Achieved      SLP SHORT TERM GOAL #4   Title Pt will be able to verbalize three non-physical deficits by giving examples from home or within ST session in 2 sessions    Baseline 09-29-20    Status Partially Met            SLP Long Term Goals - 11/03/20 1524      SLP LONG TERM GOAL #1   Title Pt will verbalize functional application of 3 memory/attention strategies for home/work environments with rare min A over 2 sessions    Baseline 10-22-20    Time 1    Period Weeks   or 17 total visits for all LTGs   Status On-going      SLP LONG TERM GOAL #2   Title Pt will demonstrate speech compensations for 100% intelligibility in 15 mod complex conversation with rare min A over 2 sessions    Baseline 11-03-20    Time 1    Period Weeks    Status On-going      SLP LONG TERM GOAL #3   Title Pt and wife will report consistent carryover of recommended swallow strategies and reduced s/sx of aspiration over 3 sessions    Baseline 10-15-20, 11-03-20    Time 1    Period Weeks    Status On-going      SLP LONG TERM GOAL #4   Title Pt will demonstrate improved insight and awareness of current deficits and impact on his performance with both work and home environments with rare min A over 2 sessions    Baseline 10-20-20, 11-03-20    Status Achieved            Plan - 11/03/20 1739    Clinical Impression Statement [name] is seen for ST intervention to address mild cognitive communication impairment and minimal dysphagia s/p CVA in February 2021. Rare word finding, slurred speech, and coughing during PO intake reported given HEP task to track episodes for increased awareness. SLP continues to  target carryover of compensations for word finding, awareness of cog-comm deficits, and attention to tasks/instructions, with intermittent cues required to use compensations and improve functioning. Pt would benefit from skilled ST intervention to address mild dysarthria, mild cognitive communication deficits, and mild oral dysphagia to aid patient in return to PLOF and work, reduce communication breakdown, and decrease aspiration risk.    Speech Therapy Frequency 2x /  week    Duration 8 weeks   or 17 total visits   Treatment/Interventions Compensatory strategies;Patient/family education;Functional tasks;Cueing hierarchy;Multimodal communcation approach;Cognitive reorganization;Environmental controls;Diet toleration management by SLP;Aspiration precaution training;Compensatory techniques;Internal/external aids;SLP instruction and feedback    Potential to Achieve Goals Fair    Potential Considerations Cooperation/participation level;Previous level of function    SLP Home Exercise Plan provided    Consulted and Agree with Plan of Care Patient           Patient will benefit from skilled therapeutic intervention in order to improve the following deficits and impairments:   Aphasia  Cognitive communication deficit    Problem List Patient Active Problem List   Diagnosis Date Noted  . Orthostatic syncope 08/06/2020  . CVA (cerebral vascular accident) ([place]) 07/21/2020  . Acute CVA (cerebrovascular accident) ([place]) 07/20/2020  . Glaucoma   . Hypertension   . S/P CABG x 4   . Coronary artery disease 07/08/2020  . NSTEMI (non-ST elevated myocardial infarction) ([place]) 07/02/2020  . Hypertensive emergency 07/01/2020    Alinda Deem, [place] CCC-SLP 11/03/2020, 5:41 PM  [facility] [address] [place], [place], [address] Phone: [phone]   Fax:  [phone]   Name: Leonard Donovan MRN: [id number] Date of Birth: 06-14-1965

## 2020-11-03 NOTE — Patient Instructions (Signed)
  Reflect on your current challenges at home and think about anything mental challenges you may have returning to work. Get Carla's input about how you are doing at home.

## 2020-11-05 ENCOUNTER — Ambulatory Visit: Payer: BC Managed Care – PPO | Admitting: Occupational Therapy

## 2020-11-05 ENCOUNTER — Ambulatory Visit: Payer: BC Managed Care – PPO

## 2020-11-05 ENCOUNTER — Other Ambulatory Visit: Payer: Self-pay

## 2020-11-05 ENCOUNTER — Encounter: Payer: Self-pay | Admitting: Occupational Therapy

## 2020-11-05 DIAGNOSIS — R471 Dysarthria and anarthria: Secondary | ICD-10-CM

## 2020-11-05 DIAGNOSIS — R41841 Cognitive communication deficit: Secondary | ICD-10-CM

## 2020-11-05 DIAGNOSIS — M6281 Muscle weakness (generalized): Secondary | ICD-10-CM

## 2020-11-05 DIAGNOSIS — M25512 Pain in left shoulder: Secondary | ICD-10-CM

## 2020-11-05 DIAGNOSIS — R2681 Unsteadiness on feet: Secondary | ICD-10-CM

## 2020-11-05 DIAGNOSIS — I69354 Hemiplegia and hemiparesis following cerebral infarction affecting left non-dominant side: Secondary | ICD-10-CM

## 2020-11-05 NOTE — Therapy (Signed)
[facility] [address] [place], [place], [address] Phone: [phone]   Fax:  [phone]  Speech Language Pathology Treatment- Recertification  Patient Details  Name: Leonard Donovan MRN: [id number] Date of Birth: [birth year] Referring Provider (SLP): Frann Rider NP   Encounter Date: 11/05/2020   End of Session - 11/05/20 1746     Visit Number 17    Number of Visits 25   2x/week for 4 more weeks (8 additional visits)   Date for SLP Re-Evaluation 12/17/20    Authorization Type BCBS    SLP Start Time 1702    SLP Stop Time  7408    SLP Time Calculation (min) 43 min    Activity Tolerance Patient tolerated treatment well             Past Medical History:  Diagnosis Date   CAD (coronary artery disease) of bypass graft    CABG x 4   Glaucoma    H/O: CVA (cerebrovascular accident) 2022   Hyperlipidemia LDL goal <70    Hypertension     Past Surgical History:  Procedure Laterality Date   CORONARY ARTERY BYPASS GRAFT N/A 07/08/2020   Procedure: CORONARY ARTERY BYPASS GRAFTING (CABG), ON PUMP, TIMES FOUR, LEFT INTERNAL MAMMARY ARTERY TO LAD, RIGHT SVG TO PDA, DISTAL CIRCUMFLEX, AND OM1;  Surgeon: Grace Isaac, MD;  Location: [facility];  Service: Open Heart Surgery;  Laterality: N/A;   ENDOVEIN HARVEST OF GREATER SAPHENOUS VEIN Right 07/08/2020   Procedure: ENDOVEIN HARVEST OF RIGHT GREATER SAPHENOUS VEIN;  Surgeon: Grace Isaac, MD;  Location: [facility];  Service: Open Heart Surgery;  Laterality: Right;   LEFT HEART CATH AND CORONARY ANGIOGRAPHY N/A 07/02/2020   Procedure: LEFT HEART CATH AND CORONARY ANGIOGRAPHY;  Surgeon: Jettie Booze, MD;  Location: [facility];  Service: Cardiovascular;  Laterality: N/A;   NO PAST SURGERIES     TEE WITHOUT CARDIOVERSION N/A 07/08/2020   Procedure: TRANSESOPHAGEAL ECHOCARDIOGRAM (TEE);  Surgeon: Grace Isaac, MD;  Location: [facility];  Service: Open Heart Surgery;   Laterality: N/A;    There were no vitals filed for this visit.   Subjective Assessment - 11/05/20 1704     Subjective "not back to normal"    Patient is accompained by: Family member   Leonard Donovan   Currently in Pain? Yes    Pain Score 1     Pain Location Shoulder                   ADULT SLP TREATMENT - 11/05/20 1747       General Information   Behavior/Cognition Alert;Cooperative;Pleasant mood;Distractible      Treatment Provided   Treatment provided Cognitive-Linquistic      Cognitive-Linquistic Treatment   Treatment focused on Dysarthria;Cognition;Patient/family/caregiver education    Skilled Treatment Pt and wife attended this session. Re-certification and need for additional ST servies discussed, in which pt's wife indicated he has progressed yet is far from baseline. Pt exhibited increased difficulty with comprehension of simple math language, which wife indicated is not near baseline. Wife reported patient washed clothes with detergent that wife has always been allergic too. Topic was just discussed the night before. SLP to add additional goal to improve cognitive communication to aid functioning at home and optimize return to work.      Assessment / Recommendations / Plan   Plan Goals updated      Progression Toward Goals   Progression toward goals Progressing  toward goals                SLP Short Term Goals - 10/13/20 1722       SLP SHORT TERM GOAL #1   Title Pt will verbalize functional application of 2 memory/attention strategies for home/work environments with occasional min A over 2 sessions    Baseline 10-06-20    Period Weeks   or 9 total visits for all STGs   Status Partially Met      SLP SHORT TERM GOAL #2   Title Pt will demonstrate speech compensations for 100% intelligibility in 10 simple conversation with rare min A over 2 sessions    Baseline 10-06-20    Status Partially Met      SLP SHORT TERM GOAL #3   Title Pt will verbalize and  demonstrate recommended swallow strategies in therapy and at home with min A over 2 sessions    Baseline 09-29-20, 10-08-20    Status Achieved      SLP SHORT TERM GOAL #4   Title Pt will be able to verbalize three non-physical deficits by giving examples from home or within ST session in 2 sessions    Baseline 09-29-20    Status Partially Met              SLP Long Term Goals - 11/05/20 1750       SLP LONG TERM GOAL #1   Title Pt will verbalize functional application of 3 memory/attention strategies for home/work environments with rare min A over 2 sessions    Baseline 10-22-20    Time 4    Period Weeks   or 25 total visits for all LTGs   Status On-going      SLP LONG TERM GOAL #2   Title Pt will demonstrate speech compensations for 100% intelligibility in 15 mod complex conversation with rare min A over 3 sessions    Baseline 11-03-20    Time 4    Period Weeks    Status On-going      SLP LONG TERM GOAL #3   Title Pt and wife will report consistent carryover of recommended swallow strategies and reduced s/sx of aspiration over 3 sessions    Baseline 10-15-20, 11-03-20, 11-05-20    Status Achieved      SLP LONG TERM GOAL #4   Title Pt will demonstrate improved insight and awareness of current deficits and impact on his performance with both work and home environments with rare min A over 2 sessions    Baseline 10-20-20, 11-03-20    Status Achieved      SLP LONG TERM GOAL #5   Title Pt will verbalize how to organize/plan/sequence day to day home/work tasks for increased functional independence with occasional min A over 2 sessions    Time 4    Period Weeks    Status New              Plan - 11/05/20 1753     Clinical Impression Statement Leonard Donovan is seen for ST intervention to address mild to mod cognitive communication impairment and resolving dysphagia s/p CVA in February 2021. Pt's wife participated in discussion re: ST recertification due to pt's limited input, in which wife  endorsed pt's cognitive communication has progressed yet not returned to baseline. Concern for return to work reported with examples of reduced attention and comprehension reported at home. Further functional cognitive communication skills related to work/home to be addressed to hopefully maximize pt's ability to  return to work successfully. POC extended 2x/week for 4 more weeks. Pt would continue to benefit from skilled ST intervention to address cognitive communication, aphasia, dysarthria, and monitor oral dysphagia to aid patient in return to PLOF and work, reduce communication breakdown, and decrease aspiration risk.    Speech Therapy Frequency 2x / week    Duration 4 weeks   4 more week for 8 additional visits (25 total visits)   Treatment/Interventions Compensatory strategies;Patient/family education;Functional tasks;Cueing hierarchy;Multimodal communcation approach;Cognitive reorganization;Environmental controls;Diet toleration management by SLP;Aspiration precaution training;Compensatory techniques;Internal/external aids;SLP instruction and feedback    Potential to Achieve Goals Fair    Potential Considerations Cooperation/participation level;Previous level of function    SLP Home Exercise Plan provided    Consulted and Agree with Plan of Care Patient             Patient will benefit from skilled therapeutic intervention in order to improve the following deficits and impairments:   Cognitive communication deficit  Dysarthria and anarthria    Problem List Patient Active Problem List   Diagnosis Date Noted   Orthostatic syncope 08/06/2020   CVA (cerebral vascular accident) ([facility]) 07/21/2020   Acute CVA (cerebrovascular accident) ([facility]) 07/20/2020   Glaucoma    Hypertension    S/P CABG x 4    Coronary artery disease 07/08/2020   NSTEMI (non-ST elevated myocardial infarction) ([facility]) 07/02/2020   Hypertensive emergency 07/01/2020    Alinda Deem, MA CCC-SLP 11/05/2020, 6:02  PM  [facility] [address] [place], [place], [address] Phone: [phone]   Fax:  [phone]   Name: Leonard Donovan MRN: [id number] Date of Birth: [birth year]

## 2020-11-05 NOTE — Patient Instructions (Signed)
Write down mental skills that you need to successfully return to work:           What are mental challenges you might experience for returning to work?

## 2020-11-05 NOTE — Therapy (Signed)
[facility] [address] [place], [place], [address] Phone: [phone]   Fax:  [phone]  Occupational Therapy Treatment  Patient Details  Name: Leonard Donovan MRN: [id number] Date of Birth: 1966-05-26 Referring Provider (OT): Ihor Austin   Encounter Date: 11/05/2020   OT End of Session - 11/05/20 1814     Visit Number 15    Number of Visits 17    Date for OT Re-Evaluation 11/23/20    Authorization Type BCBS:  VL:MN    OT Start Time 1545    OT Stop Time 1600    OT Time Calculation (min) 15 min             Past Medical History:  Diagnosis Date   CAD (coronary artery disease) of bypass graft    CABG x 4   Glaucoma    H/O: CVA (cerebrovascular accident) 2022   Hyperlipidemia LDL goal <70    Hypertension     Past Surgical History:  Procedure Laterality Date   CORONARY ARTERY BYPASS GRAFT N/A 07/08/2020   Procedure: CORONARY ARTERY BYPASS GRAFTING (CABG), ON PUMP, TIMES FOUR, LEFT INTERNAL MAMMARY ARTERY TO LAD, RIGHT SVG TO PDA, DISTAL CIRCUMFLEX, AND OM1;  Surgeon: Delight Ovens, MD;  Location: [facility];  Service: Open Heart Surgery;  Laterality: N/A;   ENDOVEIN HARVEST OF GREATER SAPHENOUS VEIN Right 07/08/2020   Procedure: ENDOVEIN HARVEST OF RIGHT GREATER SAPHENOUS VEIN;  Surgeon: Delight Ovens, MD;  Location: [facility];  Service: Open Heart Surgery;  Laterality: Right;   LEFT HEART CATH AND CORONARY ANGIOGRAPHY N/A 07/02/2020   Procedure: LEFT HEART CATH AND CORONARY ANGIOGRAPHY;  Surgeon: Corky Crafts, MD;  Location: [facility];  Service: Cardiovascular;  Laterality: N/A;   NO PAST SURGERIES     TEE WITHOUT CARDIOVERSION N/A 07/08/2020   Procedure: TRANSESOPHAGEAL ECHOCARDIOGRAM (TEE);  Surgeon: Delight Ovens, MD;  Location: [facility];  Service: Open Heart Surgery;  Laterality: N/A;    There were no vitals filed for this visit.   Subjective Assessment - 11/05/20 1810     Subjective   Patient reports pain better in the pool - lasted for a few hours    Patient is accompanied by: Family member   wife   Currently in Pain? Yes    Pain Score 1     Pain Location Shoulder    Pain Orientation Left    Pain Descriptors / Indicators Aching    Pain Type Chronic pain    Pain Onset More than a month ago    Pain Frequency Constant    Aggravating Factors  movement, hanging    Pain Relieving Factors heat                          OT Treatments/Exercises (OP) - 11/05/20 0001       ADLs   ADL Comments Patient is approaching end of plan of care - used this time to discuss next steps.  Patient has opted to use remaining visits for the pool to help aleve pain symptoms.  Patient and wife given resources for neoprene arm cuff to potentially help arm pain.                    OT Education - 11/05/20 1814     Education Details neoprene arm cuff resource    Person(s) Educated Patient;Spouse    Methods Explanation    Comprehension  Verbalized understanding              OT Short Term Goals - 11/02/20 1323       OT SHORT TERM GOAL #1   Title Patient will complete an HEP designed to improve grip strength in left hand    Time 4    Period Weeks    Status Achieved      OT SHORT TERM GOAL #2   Title Patient will complete an HEP designed to improve range of motion and decrease pain in left shoulder    Time 4    Period Weeks    Status Achieved      OT SHORT TERM GOAL #3   Title Patient will report greater ease in  washing lower legs and donning socks.    Time 4    Period Weeks    Status Achieved   pt reports greater ease. 09/22/20     OT SHORT TERM GOAL #4   Title Patient will report improved ability to get onto and off of lawn mower (management of left leg)    Time 4    Period Weeks    Status On-going               OT Long Term Goals - 11/02/20 1323       OT LONG TERM GOAL #1   Title Patient will complete an updated HEP to address LUE  functioning    Time 8    Period Weeks    Status On-going      OT LONG TERM GOAL #2   Title Patient will demonstrate 5 lb increase in left grip strength to improve functional grasp    Time 4    Period Weeks    Status Achieved   40lbs with LUE 10/06/20     OT LONG TERM GOAL #3   Title Patient will reach overhead to obtain and place an object no heavier than 2 lbs x 3 without increased pain.    Time 8    Period Weeks    Status On-going      OT LONG TERM GOAL #4   Title Patient and wife will demonstrate understanding of return to driving recommendations    Time 8    Period Weeks    Status On-going      OT LONG TERM GOAL #5   Title Patient and wife will demonstrate understanding of return to work recommendations    Time 8    Period Weeks    Status On-going                   Plan - 11/05/20 1814     Clinical Impression Statement Pt opting to complete final therapy sessions in pool environment.    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 ADL;Coordination;Endurance;GMC;Muscle spasms;UE functional use;Vestibular;Decreased knowledge of precautions;Balance;Body mechanics;Decreased knowledge of use of DME;Flexibility;IADL;Pain;Strength;FMC;Dexterity;Cardiopulmonary status limiting activity;Mobility;ROM;Tone    Cognitive Skills Attention;Emotional;Energy/Drive;Learn;Memory;Temperament/Personality;Sequencing;Safety Awareness;Problem Solve;Perception    Rehab Potential Good    Clinical Decision Making Several treatment options, min-mod task modification necessary    Comorbidities Affecting Occupational Performance: May have comorbidities impacting occupational performance     Modification or Assistance to Complete Evaluation  Min-Moderate modification of tasks or assist with assess  necessary to complete eval    OT Frequency 2x / week    OT Duration 8 weeks    OT Treatment/Interventions Self-care/ADL training;Electrical Stimulation;Therapeutic exercise;Patient/family education;Splinting;Neuromuscular education;Paraffin;Moist Heat;Aquatic Therapy;Fluidtherapy;Energy conservation;Building services engineer;Therapeutic activities;Balance training;Cryotherapy;Ultrasound;Contrast Bath;DME and/or AE instruction;Manual Therapy;Passive range of motion;Cognitive remediation/compensation    Plan continue addressing shoulder pain, pool for shoulder    Consulted and Agree with Plan of Care Patient    Family Member Consulted Wife Carla             Patient will benefit from skilled therapeutic intervention in order to improve the following deficits and impairments:   Body Structure / Function / Physical Skills: ADL, Coordination, Endurance, GMC, Muscle spasms, UE functional use, Vestibular, Decreased knowledge of precautions, Balance, Body mechanics, Decreased knowledge of use of DME, Flexibility, IADL, Pain, Strength, FMC, Dexterity, Cardiopulmonary status limiting activity, Mobility, ROM, Tone Cognitive Skills: Attention, Emotional, Energy/Drive, Learn, Memory, Temperament/Personality, Sequencing, Safety Awareness, Problem Solve, Perception     Visit Diagnosis: Hemiplegia and hemiparesis following cerebral infarction affecting left non-dominant side (HCC)  Acute pain of left shoulder  Muscle weakness (generalized)  Unsteadiness on feet    Problem List Patient Active Problem List   Diagnosis Date Noted   Orthostatic syncope 08/06/2020   CVA (cerebral vascular accident) ([facility]) 07/21/2020   Acute CVA (cerebrovascular accident) ([facility]) 07/20/2020   Glaucoma    Hypertension    S/P CABG x 4    Coronary artery disease 07/08/2020   NSTEMI (non-ST elevated myocardial  infarction) ([facility]) 07/02/2020   Hypertensive emergency 07/01/2020    Collier Salina 11/05/2020, 6:16 PM  [facility] [address] [place], [place], [address] Phone: [phone]   Fax:  [phone]  Name: Leonard Donovan MRN: [id number] Date of Birth: 1966-02-25

## 2020-11-09 ENCOUNTER — Ambulatory Visit: Payer: BC Managed Care – PPO

## 2020-11-09 ENCOUNTER — Other Ambulatory Visit: Payer: Self-pay

## 2020-11-09 DIAGNOSIS — R2681 Unsteadiness on feet: Secondary | ICD-10-CM | POA: Diagnosis not present

## 2020-11-09 DIAGNOSIS — R41841 Cognitive communication deficit: Secondary | ICD-10-CM

## 2020-11-09 DIAGNOSIS — R4701 Aphasia: Secondary | ICD-10-CM

## 2020-11-09 NOTE — Therapy (Signed)
[facility] [address] [place], [place], [address] Phone: [phone]   Fax:  [phone]  Speech Language Pathology Treatment  Patient Details  Name: Leonard Donovan MRN: [id number] Date of Birth: 1965/08/17 Referring Provider (SLP): Frann Rider NP   Encounter Date: 11/09/2020   End of Session - 11/09/20 1529     Visit Number 18    Number of Visits 25    Date for SLP Re-Evaluation 12/17/20    Authorization Type BCBS    SLP Start Time 2902    SLP Stop Time  1115    SLP Time Calculation (min) 45 min    Activity Tolerance Patient tolerated treatment well             Past Medical History:  Diagnosis Date   CAD (coronary artery disease) of bypass graft    CABG x 4   Glaucoma    H/O: CVA (cerebrovascular accident) 2022   Hyperlipidemia LDL goal <70    Hypertension     Past Surgical History:  Procedure Laterality Date   CORONARY ARTERY BYPASS GRAFT N/A 07/08/2020   Procedure: CORONARY ARTERY BYPASS GRAFTING (CABG), ON PUMP, TIMES FOUR, LEFT INTERNAL MAMMARY ARTERY TO LAD, RIGHT SVG TO PDA, DISTAL CIRCUMFLEX, AND OM1;  Surgeon: Grace Isaac, MD;  Location: [facility];  Service: Open Heart Surgery;  Laterality: N/A;   ENDOVEIN HARVEST OF GREATER SAPHENOUS VEIN Right 07/08/2020   Procedure: ENDOVEIN HARVEST OF RIGHT GREATER SAPHENOUS VEIN;  Surgeon: Grace Isaac, MD;  Location: [facility];  Service: Open Heart Surgery;  Laterality: Right;   LEFT HEART CATH AND CORONARY ANGIOGRAPHY N/A 07/02/2020   Procedure: LEFT HEART CATH AND CORONARY ANGIOGRAPHY;  Surgeon: Jettie Booze, MD;  Location: [facility];  Service: Cardiovascular;  Laterality: N/A;   NO PAST SURGERIES     TEE WITHOUT CARDIOVERSION N/A 07/08/2020   Procedure: TRANSESOPHAGEAL ECHOCARDIOGRAM (TEE);  Surgeon: Grace Isaac, MD;  Location: [facility];  Service: Open Heart Surgery;  Laterality: N/A;    There were no vitals filed for this  visit.   Subjective Assessment - 11/09/20 1530     Subjective "I don't remember" re: weekend plans    Currently in Pain? Yes    Pain Score 1     Pain Location Shoulder    Pain Orientation Left    Pain Descriptors / Indicators Aching    Pain Type Chronic pain                   ADULT SLP TREATMENT - 11/09/20 1529       General Information   Behavior/Cognition Alert;Cooperative;Pleasant mood;Distractible      Treatment Provided   Treatment provided Cognitive-Linquistic      Cognitive-Linquistic Treatment   Treatment focused on Aphasia;Cognition;Patient/family/caregiver education    Skilled Treatment Pt completed HEP to write down list of concerns, in which pt rated "using right words to talk to staff/principle/teachers" as highest concern. Improved awareness and insight into deficits demonstrated this session. SLP created external aids for most used words at work, in which pt required additional processing time to verbalize. SLP targeted review of functional math language, in which pt required some assistance to improve comprehension. Pt noted with deviations in attention x3.      Assessment / Recommendations / Plan   Plan Continue with current plan of care      Progression Toward Goals   Progression toward goals Progressing toward goals  SLP Education - 11/09/20 1609     Education Details concerns for returning to work, external aid for word finding, functional math    Person(s) Educated Patient    Methods Explanation;Demonstration;Handout    Comprehension Verbalized understanding;Returned demonstration;Need further instruction              SLP Short Term Goals - 10/13/20 1722       SLP SHORT TERM GOAL #1   Title Pt will verbalize functional application of 2 memory/attention strategies for home/work environments with occasional min A over 2 sessions    Baseline 10-06-20    Period Weeks   or 9 total visits for all STGs   Status Partially Met       SLP SHORT TERM GOAL #2   Title Pt will demonstrate speech compensations for 100% intelligibility in 10 simple conversation with rare min A over 2 sessions    Baseline 10-06-20    Status Partially Met      SLP SHORT TERM GOAL #3   Title Pt will verbalize and demonstrate recommended swallow strategies in therapy and at home with min A over 2 sessions    Baseline 09-29-20, 10-08-20    Status Achieved      SLP SHORT TERM GOAL #4   Title Pt will be able to verbalize three non-physical deficits by giving examples from home or within ST session in 2 sessions    Baseline 09-29-20    Status Partially Met              SLP Long Term Goals - 11/09/20 1529       SLP LONG TERM GOAL #1   Title Pt will verbalize functional application of 3 memory/attention strategies for home/work environments with rare min A over 2 sessions    Baseline 10-22-20    Time 4    Period Weeks   or 25 total visits for all LTGs   Status On-going      SLP LONG TERM GOAL #2   Title Pt will demonstrate speech compensations for 100% intelligibility in 15 mod complex conversation with rare min A over 3 sessions    Baseline 11-03-20    Time 4    Period Weeks    Status On-going      SLP LONG TERM GOAL #3   Title Pt and wife will report consistent carryover of recommended swallow strategies and reduced s/sx of aspiration over 3 sessions    Baseline 10-15-20, 11-03-20, 11-05-20    Status Achieved      SLP LONG TERM GOAL #4   Title Pt will demonstrate improved insight and awareness of current deficits and impact on his performance with both work and home environments with rare min A over 2 sessions    Baseline 10-20-20, 11-03-20    Status Achieved      SLP LONG TERM GOAL #5   Title Pt will verbalize how to organize/plan/sequence day to day home/work tasks for increased functional independence with occasional min A over 2 sessions    Time 4    Period Weeks    Status On-going              Plan - 11/09/20 1706      Clinical Impression Statement Jhace is seen for ST intervention to address mild to mod cognitive communication impairment and resolving dysphagia s/p CVA in February 2021. SLP targeted functional cognitive communication skills related to work/home and created external aid to facilitate word finding and recall at workplace.  Usual additional processing time exhibited for naming and comprehension of functional math language. Pt would continue to benefit from skilled ST intervention to address cognitive communication, aphasia, dysarthria, and monitor oral dysphagia to aid patient in return to PLOF and work, reduce communication breakdown, and decrease aspiration risk.    Speech Therapy Frequency 2x / week    Duration 4 weeks   4 more week for 8 additional visits (25 total visits)   Treatment/Interventions Compensatory strategies;Patient/family education;Functional tasks;Cueing hierarchy;Multimodal communcation approach;Cognitive reorganization;Environmental controls;Diet toleration management by SLP;Aspiration precaution training;Compensatory techniques;Internal/external aids;SLP instruction and feedback    Potential to Achieve Goals Fair    Potential Considerations Cooperation/participation level;Previous level of function    SLP Home Exercise Plan provided    Consulted and Agree with Plan of Care Patient             Patient will benefit from skilled therapeutic intervention in order to improve the following deficits and impairments:   Aphasia  Cognitive communication deficit    Problem List Patient Active Problem List   Diagnosis Date Noted   Orthostatic syncope 08/06/2020   CVA (cerebral vascular accident) ([facility]) 07/21/2020   Acute CVA (cerebrovascular accident) ([facility]) 07/20/2020   Glaucoma    Hypertension    S/P CABG x 4    Coronary artery disease 07/08/2020   NSTEMI (non-ST elevated myocardial infarction) ([facility]) 07/02/2020   Hypertensive emergency 07/01/2020    Alinda Deem,  [place] CCC-SLP 11/09/2020, 5:08 PM  [facility] [address] [place], [place], [address] Phone: [phone]   Fax:  [phone]   Name: CRAY MONNIN MRN: [id number] Date of Birth: Oct 24, 1965

## 2020-11-12 ENCOUNTER — Other Ambulatory Visit: Payer: Self-pay

## 2020-11-12 ENCOUNTER — Ambulatory Visit: Payer: BC Managed Care – PPO

## 2020-11-12 DIAGNOSIS — R4701 Aphasia: Secondary | ICD-10-CM

## 2020-11-12 DIAGNOSIS — R2681 Unsteadiness on feet: Secondary | ICD-10-CM | POA: Diagnosis not present

## 2020-11-12 DIAGNOSIS — R41841 Cognitive communication deficit: Secondary | ICD-10-CM

## 2020-11-12 NOTE — Therapy (Signed)
[facility] [address] [place], [place], [address] Phone: [phone]   Fax:  [phone]  Speech Language Pathology Treatment  Patient Details  Name: Leonard Donovan MRN: [id number] Date of Birth: [birth year] Referring Provider (SLP): Frann Rider NP   Encounter Date: 11/12/2020   End of Session - 11/12/20 1330     Visit Number 19    Number of Visits 25    Date for SLP Re-Evaluation 12/17/20    Authorization Type BCBS    SLP Start Time 1315    SLP Stop Time  1400    SLP Time Calculation (min) 45 min    Activity Tolerance Patient tolerated treatment well             Past Medical History:  Diagnosis Date   CAD (coronary artery disease) of bypass graft    CABG x 4   Glaucoma    H/O: CVA (cerebrovascular accident) 2022   Hyperlipidemia LDL goal <70    Hypertension     Past Surgical History:  Procedure Laterality Date   CORONARY ARTERY BYPASS GRAFT N/A 07/08/2020   Procedure: CORONARY ARTERY BYPASS GRAFTING (CABG), ON PUMP, TIMES FOUR, LEFT INTERNAL MAMMARY ARTERY TO LAD, RIGHT SVG TO PDA, DISTAL CIRCUMFLEX, AND OM1;  Surgeon: Grace Isaac, MD;  Location: [facility];  Service: Open Heart Surgery;  Laterality: N/A;   ENDOVEIN HARVEST OF GREATER SAPHENOUS VEIN Right 07/08/2020   Procedure: ENDOVEIN HARVEST OF RIGHT GREATER SAPHENOUS VEIN;  Surgeon: Grace Isaac, MD;  Location: [facility];  Service: Open Heart Surgery;  Laterality: Right;   LEFT HEART CATH AND CORONARY ANGIOGRAPHY N/A 07/02/2020   Procedure: LEFT HEART CATH AND CORONARY ANGIOGRAPHY;  Surgeon: Jettie Booze, MD;  Location: [facility];  Service: Cardiovascular;  Laterality: N/A;   NO PAST SURGERIES     TEE WITHOUT CARDIOVERSION N/A 07/08/2020   Procedure: TRANSESOPHAGEAL ECHOCARDIOGRAM (TEE);  Surgeon: Grace Isaac, MD;  Location: [facility];  Service: Open Heart Surgery;  Laterality: N/A;    There were no vitals filed for this  visit.   Subjective Assessment - 11/12/20 1317     Subjective "I haven't been sleeping well"    Currently in Pain? Yes    Pain Score 1     Pain Location Shoulder                   ADULT SLP TREATMENT - 11/12/20 1444       General Information   Behavior/Cognition Alert;Cooperative;Pleasant mood;Distractible      Treatment Provided   Treatment provided Cognitive-Linquistic      Cognitive-Linquistic Treatment   Treatment focused on Aphasia;Cognition;Patient/family/caregiver education    Skilled Treatment Pt verbalized additional schools to add to communication support to aid word finding for returning to work. SLP provided website of school maps to improve comprehension of locations. SLP reviewed HEP, in which pt demonstrated improving comprehension of functional math language and exhibited adequate planning for written schedules. Occasional min A required to improve attention to detail impacting overall performance. SLP recommended patient practice planning and organizing weekly menu at home, including grocery list.      Assessment / Recommendations / [place] with current plan of care      Progression Toward Goals   Progression toward goals Progressing toward goals              SLP Education - 11/12/20 1330     Education Details  visual aids to aid recall/word finding for job and home    Person(s) Educated Patient    Methods Explanation;Demonstration;Handout    Comprehension Verbalized understanding;Returned demonstration;Need further instruction              SLP Short Term Goals - 10/13/20 1722       SLP SHORT TERM GOAL #1   Title Pt will verbalize functional application of 2 memory/attention strategies for home/work environments with occasional min A over 2 sessions    Baseline 10-06-20    Period Weeks   or 9 total visits for all STGs   Status Partially Met      SLP SHORT TERM GOAL #2   Title Pt will demonstrate speech compensations for  100% intelligibility in 10 simple conversation with rare min A over 2 sessions    Baseline 10-06-20    Status Partially Met      SLP SHORT TERM GOAL #3   Title Pt will verbalize and demonstrate recommended swallow strategies in therapy and at home with min A over 2 sessions    Baseline 09-29-20, 10-08-20    Status Achieved      SLP SHORT TERM GOAL #4   Title Pt will be able to verbalize three non-physical deficits by giving examples from home or within ST session in 2 sessions    Baseline 09-29-20    Status Partially Met              SLP Long Term Goals - 11/12/20 1448       SLP LONG TERM GOAL #1   Title Pt will verbalize functional application of 3 memory/attention strategies for home/work environments with rare min A over 2 sessions    Baseline 10-22-20    Time 4    Period Weeks   or 25 total visits for all LTGs   Status On-going      SLP LONG TERM GOAL #2   Title Pt will demonstrate speech compensations for 100% intelligibility in 15 mod complex conversation with rare min A over 3 sessions    Baseline 11-03-20    Time 4    Period Weeks    Status On-going      SLP LONG TERM GOAL #3   Title Pt and wife will report consistent carryover of recommended swallow strategies and reduced s/sx of aspiration over 3 sessions    Baseline 10-15-20, 11-03-20, 11-05-20    Status Achieved      SLP LONG TERM GOAL #4   Title Pt will demonstrate improved insight and awareness of current deficits and impact on his performance with both work and home environments with rare min A over 2 sessions    Baseline 10-20-20, 11-03-20    Status Achieved      SLP LONG TERM GOAL #5   Title Pt will verbalize how to organize/plan/sequence day to day home/work tasks for increased functional independence with occasional min A over 2 sessions    Time 4    Period Weeks    Status On-going              Plan - 11/12/20 1449     Clinical Impression Statement Leonard Donovan is seen for ST intervention to address mild to mod  cognitive communication impairment, mild anomia and dysarthria, and resolving dysphagia s/p CVA in February 2021. 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 Occasional additional processing time exhibited for naming and comprehension of functional math language. Pt would continue to benefit from skilled ST intervention to address cognitive communication, aphasia, dysarthria, and monitor oral dysphagia to aid patient in return to PLOF and work, reduce communication breakdown, and decrease aspiration risk.    Speech Therapy Frequency 2x / week    Duration 4 weeks   4 more week for 8 additional visits (25 total visits)   Treatment/Interventions Compensatory strategies;Patient/family education;Functional tasks;Cueing hierarchy;Multimodal communcation approach;Cognitive reorganization;Environmental controls;Diet toleration management by SLP;Aspiration precaution training;Compensatory techniques;Internal/external aids;SLP instruction and feedback    Potential to Achieve Goals Fair    Potential Considerations Cooperation/participation level;Previous level of function    SLP Home Exercise Plan provided    Consulted and Agree with Plan of Care Patient             Patient will benefit from skilled therapeutic intervention in order to improve the following deficits and impairments:   Aphasia  Cognitive communication deficit    Problem List Patient Active Problem List   Diagnosis Date Noted   Orthostatic syncope 08/06/2020   CVA (cerebral vascular accident) ([facility]) 07/21/2020   Acute CVA (cerebrovascular accident) ([facility]) 07/20/2020   Glaucoma    Hypertension    S/P CABG x 4    Coronary artery disease 07/08/2020   NSTEMI (non-ST elevated myocardial infarction) ([facility]) 07/02/2020    Hypertensive emergency 07/01/2020    Alinda Deem, MA CCC-SLP 11/12/2020, 2:52 PM  [facility] [address] [place], [place], [address] Phone: [phone]   Fax:  [phone]   Name: Leonard Donovan MRN: [id number] Date of Birth: [birth year]

## 2020-11-12 NOTE — Patient Instructions (Addendum)
   School Locater Map: http://www.chen.org/  If you have a colored printer at home, it may benefit you to print these maps at home   Plan our your weekly food menu schedule and write a grocery list for you to practice planning and organizing.   Weekly Menu: Monday:  Tuesday: Wednesday: Thursday: Friday: Saturday: Sunday:

## 2020-11-16 ENCOUNTER — Encounter: Payer: Self-pay | Admitting: Occupational Therapy

## 2020-11-16 ENCOUNTER — Ambulatory Visit: Payer: BC Managed Care – PPO | Admitting: Occupational Therapy

## 2020-11-16 DIAGNOSIS — R2681 Unsteadiness on feet: Secondary | ICD-10-CM | POA: Diagnosis not present

## 2020-11-16 NOTE — Therapy (Signed)
[facility] [address] [place], [place], [address] Phone: [phone]   Fax:  [phone]  Occupational Therapy Treatment  Patient Details  Name: Leonard Donovan MRN: [id number] Date of Birth: [birth year] Referring Provider (OT): Ihor Austin   Encounter Date: 11/16/2020   OT End of Session - 11/16/20 1906     Visit Number 16    Number of Visits 24    Date for OT Re-Evaluation 01/30/21    Authorization Type BCBS:  VL:[place]    OT Start Time 1208    OT Stop Time 1302    OT Time Calculation (min) 54 min    Activity Tolerance Patient tolerated treatment well    Behavior During Therapy [facility] for tasks assessed/performed             Past Medical History:  Diagnosis Date   CAD (coronary artery disease) of bypass graft    CABG x 4   Glaucoma    H/O: CVA (cerebrovascular accident) 2022   Hyperlipidemia LDL goal <70    Hypertension     Past Surgical History:  Procedure Laterality Date   CORONARY ARTERY BYPASS GRAFT N/A 07/08/2020   Procedure: CORONARY ARTERY BYPASS GRAFTING (CABG), ON PUMP, TIMES FOUR, LEFT INTERNAL MAMMARY ARTERY TO LAD, RIGHT SVG TO PDA, DISTAL CIRCUMFLEX, AND OM1;  Surgeon: Delight Ovens, MD;  Location: [facility];  Service: Open Heart Surgery;  Laterality: N/A;   ENDOVEIN HARVEST OF GREATER SAPHENOUS VEIN Right 07/08/2020   Procedure: ENDOVEIN HARVEST OF RIGHT GREATER SAPHENOUS VEIN;  Surgeon: Delight Ovens, MD;  Location: [facility];  Service: Open Heart Surgery;  Laterality: Right;   LEFT HEART CATH AND CORONARY ANGIOGRAPHY N/A 07/02/2020   Procedure: LEFT HEART CATH AND CORONARY ANGIOGRAPHY;  Surgeon: Corky Crafts, MD;  Location: [facility];  Service: Cardiovascular;  Laterality: N/A;   NO PAST SURGERIES     TEE WITHOUT CARDIOVERSION N/A 07/08/2020   Procedure: TRANSESOPHAGEAL ECHOCARDIOGRAM (TEE);  Surgeon: Delight Ovens, MD;  Location: [facility] OR;  Service: Open Heart Surgery;   Laterality: N/A;    There were no vitals filed for this visit.   Subjective Assessment - 11/16/20 1902     Subjective  Patient reports pain 2/10 in shoulder    Patient is accompanied by: Family member   daughter   Currently in Pain? Yes    Pain Score 2     Pain Location Shoulder    Pain Orientation Left    Pain Descriptors / Indicators Aching    Pain Type Chronic pain    Pain Onset More than a month ago    Pain Frequency Constant    Aggravating Factors  movement, hanging    Pain Relieving Factors heat, pool therapy              Patient seen for aquatic therapy visit.  Patient entered and exited pool via stairs and supervision assist.  Session occurred in 3.5-4.5 ft of water.   Patient with consistent shoulder pain, reporting he could not even pick up a weight in rehab last week.   Patient worked initially submerged to chest with back against wall on buoyancy assisted range of motion of shoulder flexion/extension/abduction/adduction with extended elbows.  Followed with resistance training with water dumbbells working in concentric and eccentric conditions.   Floatation devices used for supine - to address shoulder range of motion.  Patient limited in abduction and flexion due to pain and muscle tension.  Reporting muscle spasm and "grabbing."  Patient may benefit from medication to aide with shoulder pain / muscle tension.                        OT Short Term Goals - 11/16/20 1909       OT SHORT TERM GOAL #1   Title Patient will complete an HEP designed to improve grip strength in left hand    Time 4    Period Weeks    Status Achieved      OT SHORT TERM GOAL #2   Title Patient will complete an HEP designed to improve range of motion and decrease pain in left shoulder    Time 4    Period Weeks    Status Achieved      OT SHORT TERM GOAL #3   Title Patient will report greater ease in  washing lower legs and donning socks.    Time 4    Period Weeks     Status Achieved   pt reports greater ease. 09/22/20     OT SHORT TERM GOAL #4   Title Patient will report improved ability to get onto and off of lawn mower (management of left leg)    Time 4    Period Weeks    Status On-going               OT Long Term Goals - 11/16/20 1910       OT LONG TERM GOAL #1   Title Patient will complete an updated HEP to address LUE functioning    Time 8    Period Weeks    Status On-going      OT LONG TERM GOAL #2   Title Patient will demonstrate 5 lb increase in left grip strength to improve functional grasp    Time 4    Period Weeks    Status Achieved   40lbs with LUE 10/06/20     OT LONG TERM GOAL #3   Title Patient will reach overhead to obtain and place an object no heavier than 2 lbs x 3 without increased pain.    Time 8    Period Weeks    Status On-going      OT LONG TERM GOAL #4   Title Patient and wife will demonstrate understanding of return to driving recommendations    Time 8    Period Weeks    Status On-going      OT LONG TERM GOAL #5   Title Patient and wife will demonstrate understanding of return to work recommendations    Time 8    Period Weeks    Status On-going                   Plan - 11/16/20 1907     Clinical Impression Statement Discussed extending plan of care as patient is getting relief from pain in pool environment.  Patient may benefit from seeing a pain specialist - discussed PM&R doctor.    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 ADL;Coordination;Endurance;GMC;Muscle spasms;UE functional use;Vestibular;Decreased knowledge of precautions;Balance;Body mechanics;Decreased knowledge of use of  DME;Flexibility;IADL;Pain;Strength;FMC;Dexterity;Cardiopulmonary status limiting activity;Mobility;ROM;Tone    Cognitive Skills Attention;Emotional;Energy/Drive;Learn;Memory;Temperament/Personality;Sequencing;Safety Awareness;Problem Solve;Perception    Rehab Potential  Good    Clinical Decision Making Several treatment options, min-mod task modification necessary    Comorbidities Affecting Occupational Performance: May have comorbidities impacting occupational performance    Modification or Assistance to Complete Evaluation  Min-Moderate modification of tasks or assist with assess necessary to complete eval    OT Frequency 1x / week    OT Duration 8 weeks    OT Treatment/Interventions Self-care/ADL training;Electrical Stimulation;Therapeutic exercise;Patient/family education;Splinting;Neuromuscular education;Paraffin;Moist Heat;Aquatic Therapy;Fluidtherapy;Energy conservation;Building services engineer;Therapeutic activities;Balance training;Cryotherapy;Ultrasound;Contrast Bath;DME and/or AE instruction;Manual Therapy;Passive range of motion;Cognitive remediation/compensation    Plan continue addressing shoulder pain, pool for shoulder    Consulted and Agree with Plan of Care Patient             Patient will benefit from skilled therapeutic intervention in order to improve the following deficits and impairments:   Body Structure / Function / Physical Skills: ADL, Coordination, Endurance, GMC, Muscle spasms, UE functional use, Vestibular, Decreased knowledge of precautions, Balance, Body mechanics, Decreased knowledge of use of DME, Flexibility, IADL, Pain, Strength, FMC, Dexterity, Cardiopulmonary status limiting activity, Mobility, ROM, Tone Cognitive Skills: Attention, Emotional, Energy/Drive, Learn, Memory, Temperament/Personality, Sequencing, Safety Awareness, Problem Solve, Perception     Visit Diagnosis: Hemiplegia and hemiparesis following cerebral infarction affecting left  non-dominant side (HCC) - Plan: Ot plan of care cert/re-cert  Acute pain of left shoulder - Plan: Ot plan of care cert/re-cert  Muscle weakness (generalized) - Plan: Ot plan of care cert/re-cert  Unsteadiness on feet - Plan: Ot plan of care cert/re-cert  Attention and concentration deficit - Plan: Ot plan of care cert/re-cert    Problem List Patient Active Problem List   Diagnosis Date Noted   Orthostatic syncope 08/06/2020   CVA (cerebral vascular accident) ([facility]) 07/21/2020   Acute CVA (cerebrovascular accident) ([facility]) 07/20/2020   Glaucoma    Hypertension    S/P CABG x 4    Coronary artery disease 07/08/2020   NSTEMI (non-ST elevated myocardial infarction) ([facility]) 07/02/2020   Hypertensive emergency 07/01/2020    Collier Salina 11/16/2020, 7:17 PM  [facility] [address] [place], [place], [address] Phone: [phone]   Fax:  [phone]  Name: Leonard Donovan MRN: [id number] Date of Birth: 07-28-1965

## 2020-11-16 NOTE — Therapy (Signed)
[facility] [address] [place], [place], [address] Phone: [phone]   Fax:  [phone]  Occupational Therapy Treatment  Patient Details  Name: Leonard Donovan MRN: [id number] Date of Birth: 1965-06-05 Referring Provider (OT): Ihor Austin   Encounter Date: 11/16/2020   OT End of Session - 11/16/20 1906     Visit Number 16    Number of Visits 24    Date for OT Re-Evaluation 01/30/21    Authorization Type BCBS:  VL:[place]    OT Start Time 1208    OT Stop Time 1302    OT Time Calculation (min) 54 min    Activity Tolerance Patient tolerated treatment well    Behavior During Therapy [facility] for tasks assessed/performed             Past Medical History:  Diagnosis Date   CAD (coronary artery disease) of bypass graft    CABG x 4   Glaucoma    H/O: CVA (cerebrovascular accident) 2022   Hyperlipidemia LDL goal <70    Hypertension     Past Surgical History:  Procedure Laterality Date   CORONARY ARTERY BYPASS GRAFT N/A 07/08/2020   Procedure: CORONARY ARTERY BYPASS GRAFTING (CABG), ON PUMP, TIMES FOUR, LEFT INTERNAL MAMMARY ARTERY TO LAD, RIGHT SVG TO PDA, DISTAL CIRCUMFLEX, AND OM1;  Surgeon: Delight Ovens, MD;  Location: [facility] OR;  Service: Open Heart Surgery;  Laterality: N/A;   ENDOVEIN HARVEST OF GREATER SAPHENOUS VEIN Right 07/08/2020   Procedure: ENDOVEIN HARVEST OF RIGHT GREATER SAPHENOUS VEIN;  Surgeon: Delight Ovens, MD;  Location: [facility] OR;  Service: Open Heart Surgery;  Laterality: Right;   LEFT HEART CATH AND CORONARY ANGIOGRAPHY N/A 07/02/2020   Procedure: LEFT HEART CATH AND CORONARY ANGIOGRAPHY;  Surgeon: Corky Crafts, MD;  Location: [facility] INVASIVE CV LAB;  Service: Cardiovascular;  Laterality: N/A;   NO PAST SURGERIES     TEE WITHOUT CARDIOVERSION N/A 07/08/2020   Procedure: TRANSESOPHAGEAL ECHOCARDIOGRAM (TEE);  Surgeon: Delight Ovens, MD;  Location: [facility] OR;  Service: Open Heart Surgery;   Laterality: N/A;    There were no vitals filed for this visit.   Subjective Assessment - 11/16/20 1902     Subjective  Patient reports pain 2/10 in shoulder    Patient is accompanied by: Family member   daughter   Currently in Pain? Yes    Pain Score 2     Pain Location Shoulder    Pain Orientation Left    Pain Descriptors / Indicators Aching    Pain Type Chronic pain    Pain Onset More than a month ago    Pain Frequency Constant    Aggravating Factors  movement, hanging    Pain Relieving Factors heat, pool therapy                                    OT Short Term Goals - 11/16/20 1909       OT SHORT TERM GOAL #1   Title Patient will complete an HEP designed to improve grip strength in left hand    Time 4    Period Weeks    Status Achieved      OT SHORT TERM GOAL #2   Title Patient will complete an HEP designed to improve range of motion and decrease pain in left shoulder    Time 4    Period Weeks  Status Achieved      OT SHORT TERM GOAL #3   Title Patient will report greater ease in  washing lower legs and donning socks.    Time 4    Period Weeks    Status Achieved   pt reports greater ease. 09/22/20     OT SHORT TERM GOAL #4   Title Patient will report improved ability to get onto and off of lawn mower (management of left leg)    Time 4    Period Weeks    Status On-going               OT Long Term Goals - 11/16/20 1910       OT LONG TERM GOAL #1   Title Patient will complete an updated HEP to address LUE functioning    Time 8    Period Weeks    Status On-going      OT LONG TERM GOAL #2   Title Patient will demonstrate 5 lb increase in left grip strength to improve functional grasp    Time 4    Period Weeks    Status Achieved   40lbs with LUE 10/06/20     OT LONG TERM GOAL #3   Title Patient will reach overhead to obtain and place an object no heavier than 2 lbs x 3 without increased pain.    Time 8    Period Weeks     Status On-going      OT LONG TERM GOAL #4   Title Patient and wife will demonstrate understanding of return to driving recommendations    Time 8    Period Weeks    Status On-going      OT LONG TERM GOAL #5   Title Patient and wife will demonstrate understanding of return to work recommendations    Time 8    Period Weeks    Status On-going                   Plan - 11/16/20 1907     Clinical Impression Statement Discussed extending plan of care as patient is getting relief from pain in pool environment.  Patient may benefit from seeing a pain specialist - discussed PM&R doctor.    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 ADL;Coordination;Endurance;GMC;Muscle spasms;UE functional use;Vestibular;Decreased knowledge of precautions;Balance;Body mechanics;Decreased knowledge of use of DME;Flexibility;IADL;Pain;Strength;FMC;Dexterity;Cardiopulmonary status limiting activity;Mobility;ROM;Tone    Cognitive Skills Attention;Emotional;Energy/Drive;Learn;Memory;Temperament/Personality;Sequencing;Safety Awareness;Problem Solve;Perception    Rehab Potential Good    Clinical Decision Making Several treatment options, min-mod task modification necessary    Comorbidities Affecting Occupational Performance: May have comorbidities impacting occupational performance    Modification or Assistance to Complete Evaluation  Min-Moderate modification of tasks or assist with assess necessary to complete eval    OT Frequency 1x / week    OT Duration 8 weeks    OT Treatment/Interventions Self-care/ADL training;Electrical Stimulation;Therapeutic exercise;Patient/family education;Splinting;Neuromuscular  education;Paraffin;Moist Heat;Aquatic Therapy;Fluidtherapy;Energy conservation;Building services engineer;Therapeutic activities;Balance training;Cryotherapy;Ultrasound;Contrast Bath;DME and/or AE instruction;Manual Therapy;Passive range of motion;Cognitive remediation/compensation    Plan continue addressing shoulder pain, pool for shoulder    Consulted and Agree with Plan of Care Patient             Patient will benefit from skilled therapeutic intervention in order  to improve the following deficits and impairments:   Body Structure / Function / Physical Skills: ADL, Coordination, Endurance, GMC, Muscle spasms, UE functional use, Vestibular, Decreased knowledge of precautions, Balance, Body mechanics, Decreased knowledge of use of DME, Flexibility, IADL, Pain, Strength, FMC, Dexterity, Cardiopulmonary status limiting activity, Mobility, ROM, Tone Cognitive Skills: Attention, Emotional, Energy/Drive, Learn, Memory, Temperament/Personality, Sequencing, Safety Awareness, Problem Solve, Perception     Visit Diagnosis: Hemiplegia and hemiparesis following cerebral infarction affecting left non-dominant side (HCC) - Plan: Ot plan of care cert/re-cert  Acute pain of left shoulder - Plan: Ot plan of care cert/re-cert  Muscle weakness (generalized) - Plan: Ot plan of care cert/re-cert  Unsteadiness on feet - Plan: Ot plan of care cert/re-cert  Attention and concentration deficit - Plan: Ot plan of care cert/re-cert    Problem List Patient Active Problem List   Diagnosis Date Noted   Orthostatic syncope 08/06/2020   CVA (cerebral vascular accident) (HCC) 07/21/2020   Acute CVA (cerebrovascular accident) (HCC) 07/20/2020   Glaucoma    Hypertension    S/P CABG x 4    Coronary artery disease 07/08/2020   NSTEMI (non-ST elevated myocardial infarction) (HCC) 07/02/2020   Hypertensive emergency 07/01/2020    Collier Salina 11/16/2020, 7:16 PM  [facility] [address] [place], [place], [address] Phone: [phone]   Fax:  [phone]  Name: Leonard Donovan MRN: [id number] Date of Birth: 1965/07/28

## 2020-11-19 ENCOUNTER — Ambulatory Visit: Payer: BC Managed Care – PPO

## 2020-11-19 ENCOUNTER — Other Ambulatory Visit: Payer: Self-pay

## 2020-11-19 DIAGNOSIS — R471 Dysarthria and anarthria: Secondary | ICD-10-CM

## 2020-11-19 DIAGNOSIS — R41841 Cognitive communication deficit: Secondary | ICD-10-CM

## 2020-11-19 DIAGNOSIS — R4701 Aphasia: Secondary | ICD-10-CM

## 2020-11-19 DIAGNOSIS — R2681 Unsteadiness on feet: Secondary | ICD-10-CM | POA: Diagnosis not present

## 2020-11-19 NOTE — Therapy (Signed)
[facility] [address] [place], [place], [address] Phone: [phone]   Fax:  [phone]  Speech Language Pathology Treatment  Patient Details  Name: Leonard Donovan MRN: [id number] Date of Birth: [birth year] Referring Provider (SLP): Frann Rider NP   Encounter Date: 11/19/2020   End of Session - 11/19/20 1314     Visit Number 20    Number of Visits 25    Date for SLP Re-Evaluation 12/17/20    Authorization Type BCBS    SLP Start Time 1232    SLP Stop Time  0388    SLP Time Calculation (min) 43 min    Activity Tolerance Patient tolerated treatment well             Past Medical History:  Diagnosis Date   CAD (coronary artery disease) of bypass graft    CABG x 4   Glaucoma    H/O: CVA (cerebrovascular accident) 2022   Hyperlipidemia LDL goal <70    Hypertension     Past Surgical History:  Procedure Laterality Date   CORONARY ARTERY BYPASS GRAFT N/A 07/08/2020   Procedure: CORONARY ARTERY BYPASS GRAFTING (CABG), ON PUMP, TIMES FOUR, LEFT INTERNAL MAMMARY ARTERY TO LAD, RIGHT SVG TO PDA, DISTAL CIRCUMFLEX, AND OM1;  Surgeon: Grace Isaac, MD;  Location: [place];  Service: Open Heart Surgery;  Laterality: N/A;   ENDOVEIN HARVEST OF GREATER SAPHENOUS VEIN Right 07/08/2020   Procedure: ENDOVEIN HARVEST OF RIGHT GREATER SAPHENOUS VEIN;  Surgeon: Grace Isaac, MD;  Location: [place];  Service: Open Heart Surgery;  Laterality: Right;   LEFT HEART CATH AND CORONARY ANGIOGRAPHY N/A 07/02/2020   Procedure: LEFT HEART CATH AND CORONARY ANGIOGRAPHY;  Surgeon: Jettie Booze, MD;  Location: [place] CV LAB;  Service: Cardiovascular;  Laterality: N/A;   NO PAST SURGERIES     TEE WITHOUT CARDIOVERSION N/A 07/08/2020   Procedure: TRANSESOPHAGEAL ECHOCARDIOGRAM (TEE);  Surgeon: Grace Isaac, MD;  Location: [place];  Service: Open Heart Surgery;  Laterality: N/A;    There were no vitals filed for this  visit.   Subjective Assessment - 11/19/20 1235     Subjective "I didn't do any homework"    Currently in Pain? Yes    Pain Score 2     Pain Location Shoulder                   ADULT SLP TREATMENT - 11/19/20 1243       General Information   Behavior/Cognition Alert;Cooperative;Pleasant mood;Distractible      Treatment Provided   Treatment provided Cognitive-Linquistic      Cognitive-Linquistic Treatment   Treatment focused on Aphasia;Cognition;Dysarthria;Patient/family/caregiver education    Skilled Treatment Pt endorsed he had not completed HEP as instructed, including practice organizing/planning via meal prep. Pt was able to name two additional schools to add to communication aid for school. With use of visual aid (map), pt able to identify 3 additional schools to add to communication book. SLP provided written visual aid for weekly meal planning, which prompted patient to name items eaten this week and to plan for upcoming days with occasional prompting required.      Assessment / Recommendations / Plan   Plan Continue with current plan of care      Progression Toward Goals   Progression toward goals Progressing toward goals              SLP Education - 11/19/20 1408  Education Details complete HEP as recommended    Person(s) Educated Patient    Methods Explanation;Demonstration;Handout    Comprehension Verbalized understanding;Returned demonstration;Need further instruction              SLP Short Term Goals - 10/13/20 1722       SLP SHORT TERM GOAL #1   Title Pt will verbalize functional application of 2 memory/attention strategies for home/work environments with occasional min A over 2 sessions    Baseline 10-06-20    Period Weeks   or 9 total visits for all STGs   Status Partially Met      SLP SHORT TERM GOAL #2   Title Pt will demonstrate speech compensations for 100% intelligibility in 10 simple conversation with rare min A over 2 sessions     Baseline 10-06-20    Status Partially Met      SLP SHORT TERM GOAL #3   Title Pt will verbalize and demonstrate recommended swallow strategies in therapy and at home with min A over 2 sessions    Baseline 09-29-20, 10-08-20    Status Achieved      SLP SHORT TERM GOAL #4   Title Pt will be able to verbalize three non-physical deficits by giving examples from home or within ST session in 2 sessions    Baseline 09-29-20    Status Partially Met              SLP Long Term Goals - 11/19/20 1409       SLP LONG TERM GOAL #1   Title Pt will verbalize functional application of 3 memory/attention strategies for home/work environments with rare min A over 2 sessions    Baseline 10-22-20    Time 3    Period Weeks   or 25 total visits for all LTGs   Status On-going      SLP LONG TERM GOAL #2   Title Pt will demonstrate speech compensations for 100% intelligibility in 15 mod complex conversation with rare min A over 3 sessions    Baseline 11-03-20    Time 3    Period Weeks    Status On-going      SLP LONG TERM GOAL #3   Title Pt and wife will report consistent carryover of recommended swallow strategies and reduced s/sx of aspiration over 3 sessions    Baseline 10-15-20, 11-03-20, 11-05-20    Status Achieved      SLP LONG TERM GOAL #4   Title Pt will demonstrate improved insight and awareness of current deficits and impact on his performance with both work and home environments with rare min A over 2 sessions    Baseline 10-20-20, 11-03-20    Status Achieved      SLP LONG TERM GOAL #5   Title Pt will verbalize how to organize/plan/sequence day to day home/work tasks for increased functional independence with occasional min A over 2 sessions    Time 3    Period Weeks    Status On-going              Plan - 11/19/20 1410     Clinical Impression Statement Emric is seen for ST intervention to address mild to mod cognitive communication impairment, mild aphasia and dysarthria, and resolving  dysphagia s/p CVA in February 2021. 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 Reduced implementation at home reported this week, which pt deduced may be related to motivation. Occasional additional processing time exhibited for naming and comprehension of functional math language. Pt would continue to benefit from skilled ST intervention to address cognitive communication, aphasia, dysarthria, and monitor oral dysphagia to aid patient in return to PLOF and work, reduce communication breakdown, and decrease aspiration risk.    Speech Therapy Frequency 2x / week    Duration 4 weeks   4 more week for 8 additional visits (25 total visits)   Treatment/Interventions Compensatory strategies;Patient/family education;Functional tasks;Cueing hierarchy;Multimodal communcation approach;Cognitive reorganization;Environmental controls;Diet toleration management by SLP;Aspiration precaution training;Compensatory techniques;Internal/external aids;SLP instruction and feedback    Potential to Achieve Goals Fair    Potential Considerations Cooperation/participation level;Previous level of function    SLP Home Exercise Plan provided    Consulted and Agree with Plan of Care Patient             Patient will benefit from skilled therapeutic intervention in order to improve the following deficits and impairments:   Aphasia  Dysarthria and anarthria  Cognitive communication deficit    Problem List Patient Active Problem List   Diagnosis Date Noted   Orthostatic syncope 08/06/2020   CVA (cerebral vascular accident) ([place]) 07/21/2020   Acute CVA (cerebrovascular accident) ([place]) 07/20/2020   Glaucoma    Hypertension    S/P CABG x 4    Coronary artery disease 07/08/2020   NSTEMI (non-ST elevated  myocardial infarction) ([place]) 07/02/2020   Hypertensive emergency 07/01/2020    Alinda Deem, MA CCC-SLP 11/19/2020, 2:11 PM  [facility] [address] [place], [place], [address] Phone: [phone]   Fax:  [phone]   Name: GASPARE NETZEL MRN: [id number] Date of Birth: Nov 19, 1965

## 2020-11-23 ENCOUNTER — Ambulatory Visit: Payer: BC Managed Care – PPO

## 2020-11-24 ENCOUNTER — Ambulatory Visit: Payer: BC Managed Care – PPO

## 2020-11-24 ENCOUNTER — Ambulatory Visit (INDEPENDENT_AMBULATORY_CARE_PROVIDER_SITE_OTHER): Payer: BC Managed Care – PPO | Admitting: Adult Health

## 2020-11-24 ENCOUNTER — Other Ambulatory Visit: Payer: Self-pay

## 2020-11-24 ENCOUNTER — Encounter: Payer: Self-pay | Admitting: Adult Health

## 2020-11-24 VITALS — BP 125/83 | HR 68 | Ht 67.0 in | Wt 221.0 lb

## 2020-11-24 DIAGNOSIS — R2681 Unsteadiness on feet: Secondary | ICD-10-CM | POA: Diagnosis not present

## 2020-11-24 DIAGNOSIS — I10 Essential (primary) hypertension: Secondary | ICD-10-CM

## 2020-11-24 DIAGNOSIS — F482 Pseudobulbar affect: Secondary | ICD-10-CM

## 2020-11-24 DIAGNOSIS — I639 Cerebral infarction, unspecified: Secondary | ICD-10-CM | POA: Diagnosis not present

## 2020-11-24 DIAGNOSIS — R4701 Aphasia: Secondary | ICD-10-CM

## 2020-11-24 DIAGNOSIS — I69319 Unspecified symptoms and signs involving cognitive functions following cerebral infarction: Secondary | ICD-10-CM

## 2020-11-24 DIAGNOSIS — R29818 Other symptoms and signs involving the nervous system: Secondary | ICD-10-CM

## 2020-11-24 DIAGNOSIS — R471 Dysarthria and anarthria: Secondary | ICD-10-CM

## 2020-11-24 DIAGNOSIS — E785 Hyperlipidemia, unspecified: Secondary | ICD-10-CM

## 2020-11-24 DIAGNOSIS — R41841 Cognitive communication deficit: Secondary | ICD-10-CM

## 2020-11-24 NOTE — Therapy (Signed)
[facility] [address] [place], [place], [address] Phone: [phone]   Fax:  [phone]  Speech Language Pathology Treatment  Patient Details  Name: Leonard Donovan MRN: [id number] Date of Birth: 14-Feb-1966 Referring Provider (SLP): [place] Rider NP   Encounter Date: 11/24/2020   End of Session - 11/24/20 1915     Visit Number 21    Number of Visits 25    Date for SLP Re-Evaluation 12/17/20    Authorization Type BCBS    SLP Start Time 1622   pt arrived 7 mins late   SLP Stop Time  1700    SLP Time Calculation (min) 38 min    Activity Tolerance Patient tolerated treatment well             Past Medical History:  Diagnosis Date   CAD (coronary artery disease) of bypass graft    CABG x 4   Glaucoma    H/O: CVA (cerebrovascular accident) 2022   Hyperlipidemia LDL goal <70    Hypertension     Past Surgical History:  Procedure Laterality Date   CORONARY ARTERY BYPASS GRAFT N/A 07/08/2020   Procedure: CORONARY ARTERY BYPASS GRAFTING (CABG), ON PUMP, TIMES FOUR, LEFT INTERNAL MAMMARY ARTERY TO LAD, RIGHT SVG TO PDA, DISTAL CIRCUMFLEX, AND OM1;  Surgeon: [place] Isaac, MD;  Location: [place];  Service: Open Heart Surgery;  Laterality: N/A;   ENDOVEIN HARVEST OF GREATER SAPHENOUS VEIN Right 07/08/2020   Procedure: ENDOVEIN HARVEST OF RIGHT GREATER SAPHENOUS VEIN;  Surgeon: Leonard Isaac, MD;  Location: [place];  Service: Open Heart Surgery;  Laterality: Right;   LEFT HEART CATH AND CORONARY ANGIOGRAPHY N/A 07/02/2020   Procedure: LEFT HEART CATH AND CORONARY ANGIOGRAPHY;  Surgeon: Leonard Booze, MD;  Location: [place] CV LAB;  Service: Cardiovascular;  Laterality: N/A;   NO PAST SURGERIES     TEE WITHOUT CARDIOVERSION N/A 07/08/2020   Procedure: TRANSESOPHAGEAL ECHOCARDIOGRAM (TEE);  Surgeon: Leonard Isaac, MD;  Location: [place];  Service: Open Heart Surgery;  Laterality: N/A;    There were no  vitals filed for this visit.   Subjective Assessment - 11/24/20 1626     Subjective "I forgot my homework at home so I was late" Pt arrived 7 mins late    Currently in Pain? Yes    Pain Score 2     Pain Location Shoulder                   ADULT SLP TREATMENT - 11/24/20 1638       General Information   Behavior/Cognition Alert;Cooperative;Pleasant mood;Distractible      Treatment Provided   Treatment provided Cognitive-Linquistic      Cognitive-Linquistic Treatment   Treatment focused on Aphasia;Cognition;Dysarthria;Patient/family/caregiver education    Skilled Treatment Pt verbalized details of recent appointments, including MRI results and neurology appointment. Pt is not cleared to return to work until December and not allowed to drive at this time. Pt verbalized overt word finding episodes during recent appointments. SLP targeting naming for recent recipes, in which pt required additional time but no cues. SLP completed BNT this session, in which pt required additional processing time x9 items. Pt able to ID possible semantic paraphasias x3, in which pt able to correct prior to verbalizing errored word. Pt able to accurately describe items when anomia occured for 9 episodes. With multiple choice options, pt able to ID right answer.     Assessment / Recommendations /  Plan   Plan Continue with current plan of care      Progression Toward Goals   Progression toward goals Progressing toward goals              SLP Education - 11/24/20 1914     Education Details functional naming HEP, good use of description strategy    Person(s) Educated Patient    Methods Explanation;Demonstration;Handout    Comprehension Verbalized understanding;Returned demonstration;Need further instruction              SLP Short Term Goals - 10/13/20 1722       SLP SHORT TERM GOAL #1   Title Pt will verbalize functional application of 2 memory/attention strategies for home/work  environments with occasional min A over 2 sessions    Baseline 10-06-20    Period Weeks   or 9 total visits for all STGs   Status Partially Met      SLP SHORT TERM GOAL #2   Title Pt will demonstrate speech compensations for 100% intelligibility in 10 simple conversation with rare min A over 2 sessions    Baseline 10-06-20    Status Partially Met      SLP SHORT TERM GOAL #3   Title Pt will verbalize and demonstrate recommended swallow strategies in therapy and at home with min A over 2 sessions    Baseline 09-29-20, 10-08-20    Status Achieved      SLP SHORT TERM GOAL #4   Title Pt will be able to verbalize three non-physical deficits by giving examples from home or within ST session in 2 sessions    Baseline 09-29-20    Status Partially Met              SLP Long Term Goals - 11/24/20 1916       SLP LONG TERM GOAL #1   Title Pt will verbalize functional application of 3 memory/attention strategies for home/work environments with rare min A over 2 sessions    Baseline 10-22-20    Time 2    Period Weeks   or 25 total visits for all LTGs   Status On-going      SLP LONG TERM GOAL #2   Title Pt will demonstrate speech compensations for 100% intelligibility in 15 mod complex conversation with rare min A over 3 sessions    Baseline 11-03-20    Time 2    Period Weeks    Status On-going      SLP LONG TERM GOAL #3   Title Pt and wife will report consistent carryover of recommended swallow strategies and reduced s/sx of aspiration over 3 sessions    Baseline 10-15-20, 11-03-20, 11-05-20    Status Achieved      SLP LONG TERM GOAL #4   Title Pt will demonstrate improved insight and awareness of current deficits and impact on his performance with both work and home environments with rare min A over 2 sessions    Baseline 10-20-20, 11-03-20    Status Achieved      SLP LONG TERM GOAL #5   Title Pt will verbalize how to organize/plan/sequence day to day home/work tasks for increased functional  independence with occasional min A over 2 sessions    Time 2    Period Weeks    Status On-going              Plan - 11/24/20 1916     Clinical Impression Statement Leonard Donovan is seen for ST intervention to address  mild to mod cognitive communication impairment, mild aphasia and dysarthria, and resolving dysphagia s/p CVA in February 2021. SLP targeted functional cognitive communication skills related to work/home, including planning and organizing weekly menu for home and word finding related to tasks at home. Occasional additional processing time required for naming, with good use of description strategy exhibited. Pt would continue to benefit from skilled ST intervention to address cognitive communication, aphasia, dysarthria, and monitor oral dysphagia to aid patient in return to PLOF and work, reduce communication breakdown, and decrease aspiration risk.    Speech Therapy Frequency 2x / week    Duration 4 weeks   4 more week for 8 additional visits (25 total visits)   Treatment/Interventions Compensatory strategies;Patient/family education;Functional tasks;Cueing hierarchy;Multimodal communcation approach;Cognitive reorganization;Environmental controls;Diet toleration management by SLP;Aspiration precaution training;Compensatory techniques;Internal/external aids;SLP instruction and feedback    Potential to Achieve Goals Fair    Potential Considerations Cooperation/participation level;Previous level of function    SLP Home Exercise Plan provided    Consulted and Agree with Plan of Care Patient             Patient will benefit from skilled therapeutic intervention in order to improve the following deficits and impairments:   Aphasia  Dysarthria and anarthria  Cognitive communication deficit    Problem List Patient Active Problem List   Diagnosis Date Noted   Orthostatic syncope 08/06/2020   CVA (cerebral vascular accident) ([place]) 07/21/2020   Acute CVA (cerebrovascular accident)  ([place]) 07/20/2020   Glaucoma    Hypertension    S/P CABG x 4    Coronary artery disease 07/08/2020   NSTEMI (non-ST elevated myocardial infarction) ([place]) 07/02/2020   Hypertensive emergency 07/01/2020    Alinda Deem, MA CCC-SLP 11/24/2020, 7:18 PM  [facility] [address] [place], [place], [address] Phone: [phone]   Fax:  [phone]   Name: Leonard Donovan MRN: [id number] Date of Birth: [birth year]

## 2020-11-24 NOTE — Patient Instructions (Signed)
Name all items in garden          Name 10 books of Bible             Name 10 farming tools             Name 10 roads in [place]

## 2020-11-24 NOTE — Progress Notes (Signed)
[facility] [address] [place]. [place] [address] [phone]       STROKE FOLLOW UP NOTE  [name] Date of Birth:  1966-02-09 Medical Record Number:  [id number]   Reason for Referral: stroke follow up    SUBJECTIVE:   CHIEF COMPLAINT:  Chief Complaint  Patient presents with   Follow-up    RM 14 with spouse carla  Pt is well, still has some L sided weakness/pain, drooling and fatigue. Overall getting better      HPI:   Today, 11/24/2020, [name] returns for 14-month stroke follow-up accompanied by his wife.  Patient reports residual left-sided weakness/pain, occasional imbalance and fatigue but overall improving.  Currently working with OT for residual left-sided weakness and shoulder pain -currently being followed by orthopedics with MRI completed yesterday and has scheduled visit next week for further evaluation due to continued shoulder pain interfering with therapy sessions as well as sleep.  Also continues to work with SLP for aphasia, dysarthria and cognitive communication deficit.  Completed MBS 4/5 which was normal although he does have occasional difficulty swallowing water.  Denies new stroke/TIA symptoms. Remains on aspirin and Plavix as well as atorvastatin and zetia without associated side effects.  Blood pressure today 125/83. Completed 30-day cardiac event monitor which was negative for atrial fibrillation. He does report daytime fatigue as well as insomnia -has not previously underwent sleep study.  Wife is concerned regarding labile emotions where he may laugh or cry uncontrollably during situations that may not be that funny or sad.  No further concerns at this time.      History provided for reference purposes only Initial visit 08/24/2020 JM: [name] is being seen for hospital follow-up accompanied by his wife  Reports residual left-sided weakness, left facial droop, swallowing difficulties and cognitive  difficulties Evaluated by [place] SLP 3/7 - noted mild cognitive linguistic deficits; no concerns of aspiration -personally reviewed evaluation note -no further SLP recommended as patient and wife reported that he was at baseline At todays visit, wife reports occasional confusion and delayed processing which patient agrees with Report of swallowing difficulties only with water otherwise denies difficulty Denies new stroke/TIA symptoms 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 Has cards f/u on Wednesday Currently wearing cardiac monitor which will be completed on 4/3  No further concerns at this time  Stroke admission 07/20/2020 [name] is a 55 y.o. male with history of hypertension, coronary artery disease (s/p quadruple CABG on 07/08/2020), glaucoma, COVID-19 infection (sx on 06/07/2020, dx on 06/18/2020),  who presented on 07/20/2020 with left sided facial droop, slurred speech, and left upper extremity weakness.   Personally reviewed hospitalization pertinent progress notes, lab work and imaging with summary provided.  Evaluated by Dr. Roda Shutters with stroke work-up revealing multifocal infarcts, largest at right pontine, likely related to recent cardiac surgery however other cardioembolic source cannot be ruled out.  Recommended 30-day cardiac event monitor outpatient to rule out A. fib.  On DAPT PTA and recommended continuation at discharge per cardiology recommendations s/p CABG 07/08/2020.  LDL 49 on atorvastatin 80 mg daily.  Other stroke risk factors include former tobacco use,  obesity and suspected OSA.  Residual deficit of mild left facial droop, left hemiparesis and moderate cognitive deficits.  Stroke:  Multifocal infarcts, largest at right pontine, likely related to recent cardiac surgery. However, other cardioembolic source can not rule out Code Stroke CT head: Small focus of hypodensity in the right white matter. CTA head & neck: no intracranial arterial occlusion or high-grade stenosis. Bilateral carotid bifurcation atherosclerosis, right more than left MRI Acute right pontine infarct in addition to multiple small acute to early subacute infarcts within the bilateral frontal and parietal white matter, consistent with a central (cardiac or aortic) embolic source. No hemorrhage or mass effect. Recommend 30 day monitor to evaluate for atrial fibrillation as the source for stroke 2D Echo: EF 60 to 65% LDL 49 HgbA1c 5.6 VTE prophylaxis - Lovenox 40mg  daily Swallow eval: passed for heart healthy diet aspirin 81 mg daily and Plavix prior to admission, continue aspirin 81 mg daily and clopidogrel 75 mg daily DAPT on discharge. Therapy recommendations:  outpt SLP Disposition: home         ROS:   14 system review of systems performed and negative with exception of those listed in HPI  PMH:  Past Medical History:  Diagnosis Date   CAD (coronary artery disease) of bypass graft    CABG x 4   Glaucoma    H/O: CVA (cerebrovascular accident) 2022   Hyperlipidemia LDL goal <70    Hypertension     PSH:  Past Surgical History:  Procedure Laterality Date   CORONARY ARTERY BYPASS GRAFT N/A 07/08/2020   Procedure: CORONARY ARTERY BYPASS GRAFTING (CABG), ON PUMP, TIMES FOUR, LEFT INTERNAL MAMMARY ARTERY TO LAD, RIGHT SVG TO PDA, DISTAL CIRCUMFLEX, AND OM1;  Surgeon: 09/05/2020, MD;  Location: [facility] OR;  Service: Open Heart Surgery;  Laterality: N/A;   ENDOVEIN HARVEST OF GREATER SAPHENOUS VEIN Right 07/08/2020   Procedure: ENDOVEIN HARVEST OF RIGHT GREATER  SAPHENOUS VEIN;  Surgeon: 09/05/2020, MD;  Location: [facility] OR;  Service: Open Heart Surgery;  Laterality: Right;   LEFT HEART CATH AND CORONARY ANGIOGRAPHY N/A 07/02/2020   Procedure: LEFT HEART CATH AND CORONARY ANGIOGRAPHY;  Surgeon: 08/30/2020, MD;  Location: [facility] INVASIVE CV LAB;  Service: Cardiovascular;  Laterality: N/A;   NO PAST SURGERIES     TEE WITHOUT CARDIOVERSION N/A 07/08/2020   Procedure: TRANSESOPHAGEAL ECHOCARDIOGRAM (TEE);  Surgeon: 09/05/2020, MD;  Location: [facility] OR;  Service: Open Heart Surgery;  Laterality: N/A;    Social History:  Social History   Socioeconomic History   Marital status: Married    Spouse name: Not on file   Number of children: Not on file   Years of education: Not on file   Highest education level: Not on file  Occupational History   Not on file  Tobacco Use   Smoking status: Former    Pack years: 0.00   Smokeless tobacco: Former  [facility] Use: Never used  Substance and Sexual Activity   Alcohol use: Not Currently   Drug use: Not Currently   Sexual activity: Not on file  Other Topics Concern   Not on file  Social History Narrative   Not on file   Social Determinants of Health   Financial Resource Strain: Not on file  Food Insecurity: Not on file  Transportation Needs: Not on file  Physical Activity: Not on file  Stress: Not on file  Social Connections: Not on file  Intimate Partner Violence: Not on file    Family History:  Family History  Problem Relation Age of Onset   Hypertension Father    CAD Sister        diagnosed in her 34s   Heart attack Sister     Medications:   Current Outpatient Medications on File Prior to Visit  Medication Sig Dispense Refill   acetaminophen (TYLENOL) 500 MG tablet Take 2 tablets (1,000 mg total) by mouth every 8 (eight) hours as needed for mild pain (or discomfort). 30 tablet 0   amLODipine (NORVASC) 10 MG tablet Take 1 tablet (10 mg total) by mouth daily. 180  tablet 3   aspirin EC 81 MG EC tablet Take 1 tablet (81 mg total) by mouth daily. Swallow whole. 30 tablet 11   atorvastatin (LIPITOR) 80 MG tablet Take 1 tablet (80 mg total) by mouth daily. 90 tablet 3   carvedilol (COREG) 6.25 MG tablet Take 1 tablet (6.25 mg total) by mouth 2 (two) times daily. 180 tablet 3   chlorthalidone (HYGROTON) 25 MG tablet Take 1 tablet (25 mg total) by mouth daily. 90 tablet 3   clopidogrel (PLAVIX) 75 MG tablet Take 1 tablet (75 mg total) by mouth daily. 90 tablet 3   ezetimibe (ZETIA) 10 MG tablet Take 1 tablet (10 mg total) by mouth daily. 90 tablet 3   losartan (COZAAR) 100 MG tablet Take 1 tablet (100 mg total) by mouth daily. 90 tablet 3   Multiple Vitamins-Minerals (MULTIVITAMIN WITH MINERALS) tablet Take 1 tablet by mouth daily.     nitroGLYCERIN (NITROSTAT) 0.4 MG SL tablet Place 1 tablet (0.4 mg total) under the tongue every 5 (five) minutes as needed. 25 tablet 3   No current facility-administered medications on file prior to visit.    Allergies:  No Known Allergies    OBJECTIVE:  Physical Exam  Vitals:   11/24/20 0735  BP: 125/83  Pulse: 68  Weight: 221 lb (100.2 kg)  Height: 5\' 7"  (1.702 m)    Body mass index is 34.61 kg/m. No results found.  General: well developed, well nourished,  pleasant middle-age Caucasian male, seated, in no evident distress Head: head normocephalic and atraumatic.   Neck: supple with no carotid or supraclavicular bruits Cardiovascular: regular rate and rhythm, no murmurs Musculoskeletal: no deformity Skin:  no rash/petichiae Vascular:  Normal pulses all extremities   Neurologic Exam Mental Status: Awake and fully alert.   Fluent speech and language.  Oriented to place and time. Recent and remote memory intact. Attention span, concentration and fund of knowledge appropriate during visit with wife providing some history. Mood and affect appropriate.   Cranial Nerves: Pupils equal, briskly reactive to light.  Extraocular movements full without nystagmus. Visual fields full to confrontation. Hearing intact. Facial sensation intact.  Slight left lower facial weakness when smiling.  Tongue and palate moves normally and symmetrically.  Motor: Normal bulk and tone and strength right upper and lower extremity LUE: 5/5 deltoid and elbow extension; 4+/5 elbow flexion handgrip.  Decreased finger dexterity LLE: 5/5 Sensory.: intact to touch , pinprick , position and vibratory sensation.  Coordination: Rapid alternating movements normal in all extremities except slightly decreased left hand. Finger-to-nose and heel-to-shin performed accurately bilaterally.  Orbits right arm over left arm. Gait and Station: Arises from chair without difficulty. Stance is normal. Gait demonstrates normal stride length with mild imbalance without use of assistive device. Tandem walk and heel toe without difficulty.  Reflexes: 1+ and symmetric. Toes downgoing.        ASSESSMENT: Leonard Donovan is a 55 y.o. year old male presented with left facial droop, slurred speech and LUE weakness on 07/20/2020 with stroke work-up revealing multifocal infarcts, largest at right pontine, likely related to recent cardiac surgery (s/p quadruple CABG 2/9) although other cardioembolic sources cannot be ruled out.  Vascular risk factors include HTN, HLD, CAD s/p CABG 06/2020, COVID-19 infection 05/2020, former tobacco use and suspected OSA.      PLAN:  Multifocal infarcts:  Residual deficit: mild LUE weakness with decreased hand dexterity, cognitive impairment (high level executive functioning), left facial weakness and gait impairment with imbalance.  Continue working with neuro rehab therapies for hopeful further recovery Will continue to assist with disability - will reevaluate potential return to work at follow-up visit Cardiac event monitor negative for atrial fibrillation Continue aspirin 81 mg daily  and atorvastatin for secondary stroke  prevention.   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  Discussed potentially initiating Nuedexta - pt and wife will further discuss and will call office if interested in pursuing -educational material provided to further review HTN: BP goal <130/90.  Well-controlled on current regimen per PCP/cardiology HLD: LDL goal <70. Recent LDL 89 on atorvastatin 80 mg daily therefore Zetia recently added by cardiology Suspected OSA: Referral placed to [facility] sleep clinic CAD s/p CABG: Followed closely by cardiology.  Remains on Plavix with plans on continuing until 06/2021 per cardiology note    Follow up in 3 months or call earlier if needed   CC:  [facility] provider: Dr. Pearlean BrownieSethi PCP: Montez Hagemanurner, [place] C, DO    I spent 39 minutes of face-to-face and non-face-to-face time with patient and wife.  This included previsit chart review, lab review, study review, order entry, electronic health record documentation, and prolonged patient and wife education and discussion regarding prior stroke including secondary stroke prevention measures and aggressive stroke risk factor management, residual deficits and further recovery time, likely pseudobulbar affect and treatment options, suspected underlying sleep apnea and answered all other questions to patient and wife's satisfaction  Ihor AustinJessica McCue, AGNP-BC  [facility] [address] [place], [place] [id number]  Phone [phone] Fax [phone] Note: This document was prepared with digital dictation and possible smart phrase technology. Any transcriptional errors that result from this process are unintentional.

## 2020-11-24 NOTE — Patient Instructions (Signed)
Referral placed to [facility] sleep clinic for possible underlying sleep apnea -you will be called to schedule initial evaluation  Would recommend trialing Nuedexta for possible pseudobulbar affect -please let me know if you are interested in trialing  Continue working with therapies for hopeful ongoing improvement -disability forms will be extended  Continue aspirin 81 mg daily and clopidogrel 75 mg daily  and atorvastatin and Zetia for secondary stroke prevention  Continue to follow up with PCP regarding cholesterol and blood pressure management  Maintain strict control of hypertension with blood pressure goal below 130/90 and cholesterol with LDL cholesterol (bad cholesterol) goal below 70 mg/dL.       Followup in the future with me in 3 months or call earlier if needed       Thank you for coming to see [place] at [facility]. I hope we have been able to provide you high quality care today.  You may receive a patient satisfaction survey over the next few weeks. We would appreciate your feedback and comments so that we may continue to improve ourselves and the health of our patients.

## 2020-11-25 ENCOUNTER — Telehealth: Payer: Self-pay | Admitting: *Deleted

## 2020-11-25 NOTE — Telephone Encounter (Signed)
Form completed and signed to medical records.

## 2020-11-25 NOTE — Telephone Encounter (Signed)
Pt one [place] form faxed on 11/25/20

## 2020-11-26 ENCOUNTER — Ambulatory Visit: Payer: BC Managed Care – PPO

## 2020-11-26 ENCOUNTER — Other Ambulatory Visit: Payer: Self-pay

## 2020-11-26 DIAGNOSIS — R2681 Unsteadiness on feet: Secondary | ICD-10-CM | POA: Diagnosis not present

## 2020-11-26 DIAGNOSIS — R471 Dysarthria and anarthria: Secondary | ICD-10-CM

## 2020-11-26 DIAGNOSIS — R4701 Aphasia: Secondary | ICD-10-CM

## 2020-11-26 DIAGNOSIS — R41841 Cognitive communication deficit: Secondary | ICD-10-CM

## 2020-11-26 NOTE — Therapy (Signed)
[facility] [address] [place], [place], [address] Phone: [phone]   Fax:  [phone]  Speech Language Pathology Treatment  Patient Details  Name: JOSHAWA DUBIN MRN: [id number] Date of Birth: 01-30-1966 Referring Provider (SLP): Frann Rider NP   Encounter Date: 11/26/2020   End of Session - 11/26/20 1239     Visit Number 22    Number of Visits 25    Date for SLP Re-Evaluation 12/17/20    Authorization Type BCBS    SLP Start Time 1234    SLP Stop Time  3244    SLP Time Calculation (min) 41 min    Activity Tolerance Patient tolerated treatment well             Past Medical History:  Diagnosis Date   CAD (coronary artery disease) of bypass graft    CABG x 4   Glaucoma    H/O: CVA (cerebrovascular accident) 2022   Hyperlipidemia LDL goal <70    Hypertension     Past Surgical History:  Procedure Laterality Date   CORONARY ARTERY BYPASS GRAFT N/A 07/08/2020   Procedure: CORONARY ARTERY BYPASS GRAFTING (CABG), ON PUMP, TIMES FOUR, LEFT INTERNAL MAMMARY ARTERY TO LAD, RIGHT SVG TO PDA, DISTAL CIRCUMFLEX, AND OM1;  Surgeon: Grace Isaac, MD;  Location: [place];  Service: Open Heart Surgery;  Laterality: N/A;   ENDOVEIN HARVEST OF GREATER SAPHENOUS VEIN Right 07/08/2020   Procedure: ENDOVEIN HARVEST OF RIGHT GREATER SAPHENOUS VEIN;  Surgeon: Grace Isaac, MD;  Location: [facility];  Service: Open Heart Surgery;  Laterality: Right;   LEFT HEART CATH AND CORONARY ANGIOGRAPHY N/A 07/02/2020   Procedure: LEFT HEART CATH AND CORONARY ANGIOGRAPHY;  Surgeon: Jettie Booze, MD;  Location: [facility] CV LAB;  Service: Cardiovascular;  Laterality: N/A;   NO PAST SURGERIES     TEE WITHOUT CARDIOVERSION N/A 07/08/2020   Procedure: TRANSESOPHAGEAL ECHOCARDIOGRAM (TEE);  Surgeon: Grace Isaac, MD;  Location: [facility];  Service: Open Heart Surgery;  Laterality: N/A;    There were no vitals filed for this  visit.   Subjective Assessment - 11/26/20 1235     Subjective "I weed eated for this first time"    Currently in Pain? Yes    Pain Score 2     Pain Location Shoulder                   ADULT SLP TREATMENT - 11/26/20 1236       General Information   Behavior/Cognition Alert;Cooperative;Pleasant mood;Distractible      Treatment Provided   Treatment provided Cognitive-Linquistic      Cognitive-Linquistic Treatment   Treatment focused on Aphasia;Cognition;Dysarthria;Patient/family/caregiver education    Skilled Treatment Pt completed naming HEP with occasional A required to name 10 books of Bible. SLP targeted comprehension of functional math problems and decoding tasks, in which pt required occasional min to mod clarification and explaination to complete tasks. Increased error awareness exhibited, in which pt able to correct errors with rare min A. Dysnomia x1 exhibited in conversation this session, with pt able to correct additional dysnomia independently with use of description strategy.      Assessment / Recommendations / Plan   Plan Continue with current plan of care      Progression Toward Goals   Progression toward goals Progressing toward goals              SLP Education - 11/26/20 1252     Education  Details naming HEP, comprehension strategies    Person(s) Educated Patient    Methods Explanation;Demonstration;Handout    Comprehension Verbalized understanding;Returned demonstration;Need further instruction              SLP Short Term Goals - 10/13/20 1722       SLP SHORT TERM GOAL #1   Title Pt will verbalize functional application of 2 memory/attention strategies for home/work environments with occasional min A over 2 sessions    Baseline 10-06-20    Period Weeks   or 9 total visits for all STGs   Status Partially Met      SLP SHORT TERM GOAL #2   Title Pt will demonstrate speech compensations for 100% intelligibility in 10 simple conversation  with rare min A over 2 sessions    Baseline 10-06-20    Status Partially Met      SLP SHORT TERM GOAL #3   Title Pt will verbalize and demonstrate recommended swallow strategies in therapy and at home with min A over 2 sessions    Baseline 09-29-20, 10-08-20    Status Achieved      SLP SHORT TERM GOAL #4   Title Pt will be able to verbalize three non-physical deficits by giving examples from home or within ST session in 2 sessions    Baseline 09-29-20    Status Partially Met              SLP Long Term Goals - 11/26/20 1325       SLP LONG TERM GOAL #1   Title Pt will verbalize functional application of 3 memory/attention strategies for home/work environments with rare min A over 2 sessions    Baseline 10-22-20    Time 2    Period Weeks   or 25 total visits for all LTGs   Status On-going      SLP LONG TERM GOAL #2   Title Pt will demonstrate speech compensations for 100% intelligibility in 15 mod complex conversation with rare min A over 3 sessions    Baseline 11-03-20    Time 2    Period Weeks    Status On-going      SLP LONG TERM GOAL #3   Title Pt and wife will report consistent carryover of recommended swallow strategies and reduced s/sx of aspiration over 3 sessions    Baseline 10-15-20, 11-03-20, 11-05-20    Status Achieved      SLP LONG TERM GOAL #4   Title Pt will demonstrate improved insight and awareness of current deficits and impact on his performance with both work and home environments with rare min A over 2 sessions    Baseline 10-20-20, 11-03-20    Status Achieved      SLP LONG TERM GOAL #5   Title Pt will verbalize how to organize/plan/sequence day to day home/work tasks for increased functional independence with occasional min A over 2 sessions    Baseline 11-26-20    Time 2    Period Weeks    Status On-going              Plan - 11/26/20 1253     Clinical Impression Statement Bomani is seen for ST intervention to address mild to mod cognitive communication  impairment, mild aphasia and dysarthria, and resolving dysphagia s/p CVA in February 2021. SLP targeted written comprehension with additional processing time and occasional mod A required to improve understanding of functional math language and min A required to comprehend instructions. Dysnomia x1 exhibited  this session.  Pt would continue to benefit from skilled ST intervention to address cognitive communication, aphasia, dysarthria, and monitor oral dysphagia to aid patient in return to PLOF and work, reduce communication breakdown, and decrease aspiration risk.    Speech Therapy Frequency 2x / week    Duration 4 weeks   4 more week for 8 additional visits (25 total visits)   Treatment/Interventions Compensatory strategies;Patient/family education;Functional tasks;Cueing hierarchy;Multimodal communcation approach;Cognitive reorganization;Environmental controls;Diet toleration management by SLP;Aspiration precaution training;Compensatory techniques;Internal/external aids;SLP instruction and feedback    Potential to Achieve Goals Fair    Potential Considerations Previous level of function    SLP Home Exercise Plan provided    Consulted and Agree with Plan of Care Patient             Patient will benefit from skilled therapeutic intervention in order to improve the following deficits and impairments:   Aphasia  Dysarthria and anarthria  Cognitive communication deficit    Problem List Patient Active Problem List   Diagnosis Date Noted   Orthostatic syncope 08/06/2020   CVA (cerebral vascular accident) ([place]) 07/21/2020   Acute CVA (cerebrovascular accident) ([place]) 07/20/2020   Glaucoma    Hypertension    S/P CABG x 4    Coronary artery disease 07/08/2020   NSTEMI (non-ST elevated myocardial infarction) ([place] [facility]) 07/02/2020   Hypertensive emergency 07/01/2020    Alinda Deem, MA CCC-SLP 11/26/2020, 1:26 PM  [facility] [address] [place], [place], [address] Phone: [phone]   Fax:  [phone]   Name: SHAWNTA ZIMBELMAN MRN: [id number] Date of Birth: 04-Jul-1965

## 2020-11-27 NOTE — Progress Notes (Signed)
I agree with the above plan 

## 2020-12-01 ENCOUNTER — Ambulatory Visit: Payer: BC Managed Care – PPO | Attending: Adult Health

## 2020-12-01 ENCOUNTER — Other Ambulatory Visit: Payer: Self-pay

## 2020-12-01 DIAGNOSIS — M25512 Pain in left shoulder: Secondary | ICD-10-CM | POA: Diagnosis present

## 2020-12-01 DIAGNOSIS — M6281 Muscle weakness (generalized): Secondary | ICD-10-CM | POA: Insufficient documentation

## 2020-12-01 DIAGNOSIS — R41840 Attention and concentration deficit: Secondary | ICD-10-CM | POA: Insufficient documentation

## 2020-12-01 DIAGNOSIS — I69354 Hemiplegia and hemiparesis following cerebral infarction affecting left non-dominant side: Secondary | ICD-10-CM | POA: Diagnosis present

## 2020-12-01 DIAGNOSIS — R1310 Dysphagia, unspecified: Secondary | ICD-10-CM | POA: Insufficient documentation

## 2020-12-01 DIAGNOSIS — R2681 Unsteadiness on feet: Secondary | ICD-10-CM | POA: Diagnosis present

## 2020-12-01 DIAGNOSIS — R4701 Aphasia: Secondary | ICD-10-CM | POA: Diagnosis present

## 2020-12-01 DIAGNOSIS — R471 Dysarthria and anarthria: Secondary | ICD-10-CM | POA: Diagnosis present

## 2020-12-01 DIAGNOSIS — R41841 Cognitive communication deficit: Secondary | ICD-10-CM | POA: Diagnosis present

## 2020-12-01 NOTE — Patient Instructions (Addendum)
  How is everything going at home? Discuss with Leonard Donovan and kids if anyone is noticing any slurred speech, word finding, reduced attention, or memory problems.         Are you getting distracted while drinking? Bring a drink to next session!

## 2020-12-01 NOTE — Therapy (Signed)
[facility] [address] [place], [place], [address] Phone: [phone]   Fax:  [phone]  Speech Language Pathology Treatment  Patient Details  Name: Leonard Donovan MRN: [id number] Date of Birth: [birth year] Referring Provider (SLP): Frann Rider NP   Encounter Date: 12/01/2020   End of Session - 12/01/20 1305     Visit Number 23    Number of Visits 25    Date for SLP Re-Evaluation 12/17/20    Authorization Type BCBS    SLP Start Time 1232    SLP Stop Time  4562    SLP Time Calculation (min) 43 min    Activity Tolerance Patient tolerated treatment well             Past Medical History:  Diagnosis Date   CAD (coronary artery disease) of bypass graft    CABG x 4   Glaucoma    H/O: CVA (cerebrovascular accident) 2022   Hyperlipidemia LDL goal <70    Hypertension     Past Surgical History:  Procedure Laterality Date   CORONARY ARTERY BYPASS GRAFT N/A 07/08/2020   Procedure: CORONARY ARTERY BYPASS GRAFTING (CABG), ON PUMP, TIMES FOUR, LEFT INTERNAL MAMMARY ARTERY TO LAD, RIGHT SVG TO PDA, DISTAL CIRCUMFLEX, AND OM1;  Surgeon: Grace Isaac, MD;  Location: [facility];  Service: Open Heart Surgery;  Laterality: N/A;   ENDOVEIN HARVEST OF GREATER SAPHENOUS VEIN Right 07/08/2020   Procedure: ENDOVEIN HARVEST OF RIGHT GREATER SAPHENOUS VEIN;  Surgeon: Grace Isaac, MD;  Location: [facility];  Service: Open Heart Surgery;  Laterality: Right;   LEFT HEART CATH AND CORONARY ANGIOGRAPHY N/A 07/02/2020   Procedure: LEFT HEART CATH AND CORONARY ANGIOGRAPHY;  Surgeon: Jettie Booze, MD;  Location: [facility];  Service: Cardiovascular;  Laterality: N/A;   NO PAST SURGERIES     TEE WITHOUT CARDIOVERSION N/A 07/08/2020   Procedure: TRANSESOPHAGEAL ECHOCARDIOGRAM (TEE);  Surgeon: Grace Isaac, MD;  Location: [facility];  Service: Open Heart Surgery;  Laterality: N/A;    There were no vitals filed for this  visit.   Subjective Assessment - 12/01/20 1233     Subjective "challenging" re: HWK    Currently in Pain? Yes    Pain Score 2     Pain Location Shoulder                   ADULT SLP TREATMENT - 12/01/20 1238       General Information   Behavior/Cognition Alert;Cooperative;Pleasant mood;Distractible      Treatment Provided   Treatment provided Cognitive-Linquistic      Cognitive-Linquistic Treatment   Treatment focused on Aphasia;Cognition;Dysarthria;Patient/family/caregiver education    Skilled Treatment SLP reviewed HEP, with pt required intermittent mod A for comprehension of scheduling tasks. SLP targeted comprehension and attention task for budgeting a grocery list, with intermittent min to mod verbal and visual cues required to improve understanding and attention to instructions. Pt continues to c/o of getting choked with water and difficulty with sustaining attention. No overt concerns at home able to be verbally recalled at this time.      Assessment / Recommendations / Plan   Plan Continue with current plan of care      Progression Toward Goals   Progression toward goals Progressing toward goals              SLP Education - 12/01/20 1437     Education Details functional application of learned techniques  Person(s) Educated Patient    Methods Explanation;Demonstration;Handout    Comprehension Verbalized understanding;Returned demonstration;Need further instruction              SLP Short Term Goals - 10/13/20 1722       SLP SHORT TERM GOAL #1   Title Pt will verbalize functional application of 2 memory/attention strategies for home/work environments with occasional min A over 2 sessions    Baseline 10-06-20    Period Weeks   or 9 total visits for all STGs   Status Partially Met      SLP SHORT TERM GOAL #2   Title Pt will demonstrate speech compensations for 100% intelligibility in 10 simple conversation with rare min A over 2 sessions     Baseline 10-06-20    Status Partially Met      SLP SHORT TERM GOAL #3   Title Pt will verbalize and demonstrate recommended swallow strategies in therapy and at home with min A over 2 sessions    Baseline 09-29-20, 10-08-20    Status Achieved      SLP SHORT TERM GOAL #4   Title Pt will be able to verbalize three non-physical deficits by giving examples from home or within ST session in 2 sessions    Baseline 09-29-20    Status Partially Met              SLP Long Term Goals - 12/01/20 1306       SLP LONG TERM GOAL #1   Title Pt will verbalize functional application of 3 memory/attention strategies for home/work environments with rare min A over 2 sessions    Baseline 10-22-20    Time 1    Period Weeks   or 25 total visits for all LTGs   Status On-going      SLP LONG TERM GOAL #2   Title Pt will demonstrate speech compensations for 100% intelligibility in 15 mod complex conversation with rare min A over 3 sessions    Baseline 11-03-20    Time 1    Period Weeks    Status On-going      SLP LONG TERM GOAL #3   Title Pt and wife will report consistent carryover of recommended swallow strategies and reduced s/sx of aspiration over 3 sessions    Baseline 10-15-20, 11-03-20, 11-05-20    Status Achieved      SLP LONG TERM GOAL #4   Title Pt will demonstrate improved insight and awareness of current deficits and impact on his performance with both work and home environments with rare min A over 2 sessions    Baseline 10-20-20, 11-03-20    Status Achieved      SLP LONG TERM GOAL #5   Title Pt will verbalize how to organize/plan/sequence day to day home/work tasks for increased functional independence with occasional min A over 2 sessions    Baseline 11-26-20    Time 1    Period Weeks    Status On-going              Plan - 12/01/20 1437     Clinical Impression Statement Leonard Donovan is seen for ST intervention to address 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 SLP targeted written comprehension with additional processing time and occasional min to mod A required to improve understanding of concepts and instructions. See "skilled treatment" for additional details of today's session. Pt would continue to benefit from skilled  ST intervention to address cognitive communication, aphasia, dysarthria, and monitor oral dysphagia to aid patient in return to PLOF and work, reduce communication breakdown, and decrease aspiration risk.    Speech Therapy Frequency 2x / week    Duration 4 weeks   4 more week for 8 additional visits (25 total visits)   Treatment/Interventions Compensatory strategies;Patient/family education;Functional tasks;Cueing hierarchy;Multimodal communcation approach;Cognitive reorganization;Environmental controls;Diet toleration management by SLP;Aspiration precaution training;Compensatory techniques;Internal/external aids;SLP instruction and feedback    Potential to Achieve Goals Fair    Potential Considerations Previous level of function    SLP Home Exercise Plan provided    Consulted and Agree with Plan of Care Patient             Patient will benefit from skilled therapeutic intervention in order to improve the following deficits and impairments:   Cognitive communication deficit  Aphasia  Dysarthria and anarthria    Problem List Patient Active Problem List   Diagnosis Date Noted   Orthostatic syncope 08/06/2020   CVA (cerebral vascular accident) ([facility]) 07/21/2020   Acute CVA (cerebrovascular accident) ([facility]) 07/20/2020   Glaucoma    Hypertension    S/P CABG x 4    Coronary artery disease 07/08/2020   NSTEMI (non-ST elevated myocardial infarction) ([facility]) 07/02/2020   Hypertensive emergency 07/01/2020    Alinda Deem, [place] CCC-SLP 12/01/2020, 2:40 PM  [facility] [address] [place],  [place], [address] Phone: [phone]   Fax:  [phone]   Name: Leonard Donovan MRN: [id number] Date of Birth: 24-Jan-1966

## 2020-12-03 ENCOUNTER — Other Ambulatory Visit: Payer: Self-pay

## 2020-12-03 ENCOUNTER — Encounter: Payer: Self-pay | Admitting: Adult Health

## 2020-12-03 ENCOUNTER — Ambulatory Visit: Payer: BC Managed Care – PPO

## 2020-12-03 DIAGNOSIS — R4701 Aphasia: Secondary | ICD-10-CM

## 2020-12-03 DIAGNOSIS — R41841 Cognitive communication deficit: Secondary | ICD-10-CM | POA: Diagnosis not present

## 2020-12-03 DIAGNOSIS — R1310 Dysphagia, unspecified: Secondary | ICD-10-CM

## 2020-12-03 DIAGNOSIS — R471 Dysarthria and anarthria: Secondary | ICD-10-CM

## 2020-12-03 NOTE — Therapy (Signed)
[facility] [address] [place], [place], [address] Phone: [phone]   Fax:  [phone]  Speech Language Pathology Treatment/ Re-certification  Patient Details  Name: Leonard Donovan MRN: [id number] Date of Birth: February 05, 1966 Referring Provider (SLP): Frann Rider NP   Encounter Date: 12/03/2020   End of Session - 12/03/20 1331     Visit Number 24    Number of Visits 30   re-cert for 6 additional visits   Date for SLP Re-Evaluation 01/14/21    Authorization Type BCBS    SLP Start Time 1791    SLP Stop Time  1400    SLP Time Calculation (min) 43 min    Activity Tolerance Patient tolerated treatment well             Past Medical History:  Diagnosis Date   CAD (coronary artery disease) of bypass graft    CABG x 4   Glaucoma    H/O: CVA (cerebrovascular accident) 2022   Hyperlipidemia LDL goal <70    Hypertension     Past Surgical History:  Procedure Laterality Date   CORONARY ARTERY BYPASS GRAFT N/A 07/08/2020   Procedure: CORONARY ARTERY BYPASS GRAFTING (CABG), ON PUMP, TIMES FOUR, LEFT INTERNAL MAMMARY ARTERY TO LAD, RIGHT SVG TO PDA, DISTAL CIRCUMFLEX, AND OM1;  Surgeon: Grace Isaac, MD;  Location: [facility];  Service: Open Heart Surgery;  Laterality: N/A;   ENDOVEIN HARVEST OF GREATER SAPHENOUS VEIN Right 07/08/2020   Procedure: ENDOVEIN HARVEST OF RIGHT GREATER SAPHENOUS VEIN;  Surgeon: Grace Isaac, MD;  Location: [facility];  Service: Open Heart Surgery;  Laterality: Right;   LEFT HEART CATH AND CORONARY ANGIOGRAPHY N/A 07/02/2020   Procedure: LEFT HEART CATH AND CORONARY ANGIOGRAPHY;  Surgeon: Jettie Booze, MD;  Location: [facility];  Service: Cardiovascular;  Laterality: N/A;   NO PAST SURGERIES     TEE WITHOUT CARDIOVERSION N/A 07/08/2020   Procedure: TRANSESOPHAGEAL ECHOCARDIOGRAM (TEE);  Surgeon: Grace Isaac, MD;  Location: [facility];  Service: Open Heart Surgery;  Laterality:  N/A;    There were no vitals filed for this visit.   Subjective Assessment - 12/03/20 1508     Subjective "I haven't been sleeping well"    Currently in Pain? No/denies                   ADULT SLP TREATMENT - 12/03/20 1323       General Information   Behavior/Cognition Alert;Cooperative;Pleasant mood;Distractible      Treatment Provided   Treatment provided Cognitive-Linquistic;Dysphagia      Dysphagia Treatment   Treatment Methods Skilled observation;Compensation strategy training;Patient/caregiver education    Patient observed directly with PO's Yes    Type of PO's observed Thin liquids    Liquids provided via Cup    Oral Phase Signs & Symptoms Oral holding    Type of cueing Verbal    Amount of cueing Modified independent    Other treatment/comments Pt continues to indicate some reduced oral control with thin liquids. SLP recommended provide drink of choice today, in which pt consumed 10 sips with no overt s/sx of aspiration. Pt utilized bolus hold strategy with visual prompt. SLP again re-educated patient on taking small bites/sips.      Cognitive-Linquistic Treatment   Treatment focused on Aphasia;Cognition;Dysarthria;Patient/family/caregiver education    Skilled Treatment For hwk, pt wrote down one difficulty still experiencing. Pt's wife added to list and included intermittent word finding, with examples of  semantic paraphasias (beet for onion). Slurred speech reportedly only occuring when fatigued. Intermittent reduced attention also indicated. SLP targeted high level word finding, in which pt benefited from occasional semantic cues. Pt utilized description strategy x1 to aid naming. Pt had completed other HWK targeting comprehension and attention with 100% accuracy without assistance.      Assessment / Recommendations / Plan   Plan Continue with current plan of care;Goals updated      Progression Toward Goals   Progression toward goals Progressing toward goals               SLP Education - 12/03/20 1510     Education Details naming HEP    Person(s) Educated Patient    Methods Explanation;Demonstration;Handout    Comprehension Verbalized understanding;Returned demonstration;Need further instruction              SLP Short Term Goals - 10/13/20 1722       SLP SHORT TERM GOAL #1   Title Pt will verbalize functional application of 2 memory/attention strategies for home/work environments with occasional min A over 2 sessions    Baseline 10-06-20    Period Weeks   or 9 total visits for all STGs   Status Partially Met      SLP SHORT TERM GOAL #2   Title Pt will demonstrate speech compensations for 100% intelligibility in 10 simple conversation with rare min A over 2 sessions    Baseline 10-06-20    Status Partially Met      SLP SHORT TERM GOAL #3   Title Pt will verbalize and demonstrate recommended swallow strategies in therapy and at home with min A over 2 sessions    Baseline 09-29-20, 10-08-20    Status Achieved      SLP SHORT TERM GOAL #4   Title Pt will be able to verbalize three non-physical deficits by giving examples from home or within ST session in 2 sessions    Baseline 09-29-20    Status Partially Met              SLP Long Term Goals - 12/03/20 1511       SLP LONG TERM GOAL #1   Title Pt will verbalize functional application of 3 memory/attention strategies for home/work environments with rare min A over 2 sessions    Baseline 10-22-20, 12-03-20    Period --   or 25 total visits for all LTGs   Status Achieved      SLP LONG TERM GOAL #2   Title Pt will demonstrate speech compensations for 100% intelligibility in 30 mod complex conversation with rare min A over 3 sessions    Time 6    Period Weeks    Status Revised      SLP LONG TERM GOAL #3   Title Pt and wife will report consistent carryover of recommended swallow strategies and reduced s/sx of aspiration over 3 sessions    Baseline 10-15-20, 11-03-20, 11-05-20     Status Achieved      SLP LONG TERM GOAL #4   Title Pt will demonstrate improved insight and awareness of current deficits and impact on his performance with both work and home environments with rare min A over 2 sessions    Baseline 10-20-20, 11-03-20    Status Achieved      SLP LONG TERM GOAL #5   Title Pt will verbalize how to organize/plan/sequence day to day home/work tasks for increased functional independence with occasional min A over 2  sessions    Baseline 11-26-20, 12-03-20    Status Achieved      Additional Long Term Goals   Additional Long Term Goals Yes      SLP LONG TERM GOAL #6   Title Pt will utilize word finding compensations when anomia/dysnomia occurs for 4/5 opportunities with rare min A over 2 sessions    Time 6    Period Weeks    Status New      SLP LONG TERM GOAL #7   Title Pt will verbalize awareness of when/why swallow difficulty occurs and demonstrate how to implement swallow strategy to reduce occurance for 4/5 opportunities over 2 sessions    Time 6    Period Weeks    Status New              Plan - 12/03/20 1517     Clinical Impression Statement Leonard Donovan is seen for ST intervention to address improving 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 Pt and family identified improving yet persistent deficits, including word finding, slurred speech, attention, and swallowing. POC recertification completed for 1x/week for 6 weeks as pt has not yet met rehab potential. See "skilled treatment" for additional details of today's session. Pt would continue to benefit from skilled ST intervention to address cognitive communication, aphasia, dysarthria, and monitor dysphagia to aid patient in return to PLOF and work, reduce communication breakdown, and decrease aspiration risk.    Speech Therapy Frequency 1x /week    Duration Other (comment)   for 6 more weeks or 6 more visits    Treatment/Interventions Compensatory strategies;Patient/family education;Functional tasks;Cueing hierarchy;Multimodal communcation approach;Cognitive reorganization;Environmental controls;Diet toleration management by SLP;Aspiration precaution training;Compensatory techniques;Internal/external aids;SLP instruction and feedback    Potential to Achieve Goals Fair    Potential Considerations Previous level of function    SLP Home Exercise Plan provided    Consulted and Agree with Plan of Care Patient             Patient will benefit from skilled therapeutic intervention in order to improve the following deficits and impairments:   Aphasia  Dysarthria and anarthria  Cognitive communication deficit  Dysphagia, unspecified type    Problem List Patient Active Problem List   Diagnosis Date Noted   Orthostatic syncope 08/06/2020   CVA (cerebral vascular accident) ([facility]) 07/21/2020   Acute CVA (cerebrovascular accident) ([facility]) 07/20/2020   Glaucoma    Hypertension    S/P CABG x 4    Coronary artery disease 07/08/2020   NSTEMI (non-ST elevated myocardial infarction) ([facility]) 07/02/2020   Hypertensive emergency 07/01/2020    Alinda Deem, [place] CCC-SLP 12/03/2020, 3:25 PM  [facility] [address] [place], [place], [address] Phone: [phone]   Fax:  [phone]   Name: HUSTON STONEHOCKER MRN: [id number] Date of Birth: 1965/08/28

## 2020-12-07 ENCOUNTER — Ambulatory Visit: Payer: BC Managed Care – PPO

## 2020-12-07 ENCOUNTER — Ambulatory Visit: Payer: BC Managed Care – PPO | Admitting: Occupational Therapy

## 2020-12-07 ENCOUNTER — Other Ambulatory Visit: Payer: Self-pay

## 2020-12-07 ENCOUNTER — Encounter: Payer: Self-pay | Admitting: Occupational Therapy

## 2020-12-07 DIAGNOSIS — R471 Dysarthria and anarthria: Secondary | ICD-10-CM

## 2020-12-07 DIAGNOSIS — R4701 Aphasia: Secondary | ICD-10-CM

## 2020-12-07 DIAGNOSIS — R41841 Cognitive communication deficit: Secondary | ICD-10-CM

## 2020-12-07 NOTE — Therapy (Signed)
[facility] [address] [place], [place], [address] Phone: [phone]   Fax:  [phone]  Speech Language Pathology Treatment  Patient Details  Name: Leonard Donovan MRN: [id number] Date of Birth: 22-Sep-1965 Referring Provider (SLP): Frann Rider NP   Encounter Date: 12/07/2020   End of Session - 12/07/20 1358     Visit Number 25    Number of Visits 30    Date for SLP Re-Evaluation 01/14/21    Authorization Type BCBS    SLP Start Time 1400    SLP Stop Time  6314    SLP Time Calculation (min) 45 min    Activity Tolerance Patient tolerated treatment well             Past Medical History:  Diagnosis Date   CAD (coronary artery disease) of bypass graft    CABG x 4   Glaucoma    H/O: CVA (cerebrovascular accident) 2022   Hyperlipidemia LDL goal <70    Hypertension     Past Surgical History:  Procedure Laterality Date   CORONARY ARTERY BYPASS GRAFT N/A 07/08/2020   Procedure: CORONARY ARTERY BYPASS GRAFTING (CABG), ON PUMP, TIMES FOUR, LEFT INTERNAL MAMMARY ARTERY TO LAD, RIGHT SVG TO PDA, DISTAL CIRCUMFLEX, AND OM1;  Surgeon: Grace Isaac, MD;  Location: [facility];  Service: Open Heart Surgery;  Laterality: N/A;   ENDOVEIN HARVEST OF GREATER SAPHENOUS VEIN Right 07/08/2020   Procedure: ENDOVEIN HARVEST OF RIGHT GREATER SAPHENOUS VEIN;  Surgeon: Grace Isaac, MD;  Location: [facility];  Service: Open Heart Surgery;  Laterality: Right;   LEFT HEART CATH AND CORONARY ANGIOGRAPHY N/A 07/02/2020   Procedure: LEFT HEART CATH AND CORONARY ANGIOGRAPHY;  Surgeon: Jettie Booze, MD;  Location: [facility];  Service: Cardiovascular;  Laterality: N/A;   NO PAST SURGERIES     TEE WITHOUT CARDIOVERSION N/A 07/08/2020   Procedure: TRANSESOPHAGEAL ECHOCARDIOGRAM (TEE);  Surgeon: Grace Isaac, MD;  Location: [facility];  Service: Open Heart Surgery;  Laterality: N/A;    There were no vitals filed for this  visit.   Subjective Assessment - 12/07/20 1359     Subjective "I am tired. I just came from the pool"    Currently in Pain? Yes    Pain Score 1     Pain Location Shoulder                   ADULT SLP TREATMENT - 12/07/20 1358       General Information   Behavior/Cognition Alert;Cooperative;Pleasant mood;Distractible      Treatment Provided   Treatment provided Cognitive-Linquistic      Cognitive-Linquistic Treatment   Treatment focused on Aphasia;Cognition;Dysarthria;Patient/family/caregiver education    Skilled Treatment SLP reviewed HEP, in which pt required rare min A for semantic cues x3. Less frequent need for A on HEP reported by patient. SLP targeted convergent naming, in which rare dysnomia x2 and occasional anomia x3 exhibited in paragraph level naming task. Occasional min semantic cues and additional processing time required to complete task.      Assessment / Recommendations / Plan   Plan Continue with current plan of care;Goals updated      Progression Toward Goals   Progression toward goals Progressing toward goals              SLP Education - 12/07/20 1553     Education Details HEP    Person(s) Educated Patient    Methods Explanation;Demonstration;Handout  Comprehension Verbalized understanding;Returned demonstration;Need further instruction              SLP Short Term Goals - 10/13/20 1722       SLP SHORT TERM GOAL #1   Title Pt will verbalize functional application of 2 memory/attention strategies for home/work environments with occasional min A over 2 sessions    Baseline 10-06-20    Period Weeks   or 9 total visits for all STGs   Status Partially Met      SLP SHORT TERM GOAL #2   Title Pt will demonstrate speech compensations for 100% intelligibility in 10 simple conversation with rare min A over 2 sessions    Baseline 10-06-20    Status Partially Met      SLP SHORT TERM GOAL #3   Title Pt will verbalize and demonstrate  recommended swallow strategies in therapy and at home with min A over 2 sessions    Baseline 09-29-20, 10-08-20    Status Achieved      SLP SHORT TERM GOAL #4   Title Pt will be able to verbalize three non-physical deficits by giving examples from home or within ST session in 2 sessions    Baseline 09-29-20    Status Partially Met              SLP Long Term Goals - 12/07/20 1358       SLP LONG TERM GOAL #1   Title Pt will verbalize functional application of 3 memory/attention strategies for home/work environments with rare min A over 2 sessions    Baseline 10-22-20, 12-03-20    Period --   or 25 total visits for all LTGs   Status Achieved      SLP LONG TERM GOAL #2   Title Pt will demonstrate speech compensations for 100% intelligibility in 30 mod complex conversation with rare min A over 3 sessions    Time 6    Period Weeks    Status On-going      SLP LONG TERM GOAL #3   Title Pt and wife will report consistent carryover of recommended swallow strategies and reduced s/sx of aspiration over 3 sessions    Baseline 10-15-20, 11-03-20, 11-05-20    Status Achieved      SLP LONG TERM GOAL #4   Title Pt will demonstrate improved insight and awareness of current deficits and impact on his performance with both work and home environments with rare min A over 2 sessions    Baseline 10-20-20, 11-03-20    Status Achieved      SLP LONG TERM GOAL #5   Title Pt will verbalize how to organize/plan/sequence day to day home/work tasks for increased functional independence with occasional min A over 2 sessions    Baseline 11-26-20, 12-03-20    Status Achieved      SLP LONG TERM GOAL #6   Title Pt will utilize word finding compensations when anomia/dysnomia occurs for 4/5 opportunities with rare min A over 2 sessions    Time 6    Period Weeks    Status On-going      SLP LONG TERM GOAL #7   Title Pt will verbalize awareness of when/why swallow difficulty occurs and demonstrate how to implement swallow  strategy to reduce occurance for 4/5 opportunities over 2 sessions    Time 6    Period Weeks    Status On-going              Plan - 12/07/20 1554  Clinical Impression Statement Leonard Donovan is seen for ST intervention to address improving 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 Pt and family identified improving yet persistent deficits, including word finding, slurred speech, attention, and swallowing. SLP targeted word finding skills, in which pt benefited from occasional min semantic cues to aid word finding on structured tasks. See "skilled treatment" for additional details of today's session. Pt would continue to benefit from skilled ST intervention to address cognitive communication, aphasia, dysarthria, and monitor dysphagia to aid patient in return to PLOF and work, reduce communication breakdown, and decrease aspiration risk.    Speech Therapy Frequency 1x /week    Duration Other (comment)   for 6 weeks or 6 more visits   Treatment/Interventions Compensatory strategies;Patient/family education;Functional tasks;Cueing hierarchy;Multimodal communcation approach;Cognitive reorganization;Environmental controls;Diet toleration management by SLP;Aspiration precaution training;Compensatory techniques;Internal/external aids;SLP instruction and feedback    Potential to Achieve Goals Fair    Potential Considerations Previous level of function    SLP Home Exercise Plan provided    Consulted and Agree with Plan of Care Patient             Patient will benefit from skilled therapeutic intervention in order to improve the following deficits and impairments:   Aphasia  Dysarthria and anarthria  Cognitive communication deficit    Problem List Patient Active Problem List   Diagnosis Date Noted   Orthostatic syncope 08/06/2020   CVA (cerebral vascular accident) ([facility]) 07/21/2020   Acute CVA  (cerebrovascular accident) ([facility]) 07/20/2020   Glaucoma    Hypertension    S/P CABG x 4    Coronary artery disease 07/08/2020   NSTEMI (non-ST elevated myocardial infarction) ([facility]) 07/02/2020   Hypertensive emergency 07/01/2020    Alinda Deem, MA CCC-SLP 12/07/2020, 3:55 PM  [facility] [address] [place], [place], [address] Phone: [phone]   Fax:  [phone]   Name: Leonard Donovan MRN: [id number] Date of Birth: 07-Mar-1966

## 2020-12-07 NOTE — Therapy (Signed)
[facility] [address] [place], [place], [address] Phone: [phone]   Fax:  [phone]  Occupational Therapy Treatment  Patient Details  Name: Leonard Donovan MRN: [id number] Date of Birth: March 07, 1966 Referring Provider (OT): Ihor Austin   Encounter Date: 12/07/2020   OT End of Session - 12/07/20 1315     Visit Number 17    Number of Visits 24    Date for OT Re-Evaluation 01/30/21    Authorization Type BCBS:  [id number]    OT Start Time 1215    OT Stop Time 1300    OT Time Calculation (min) 45 min    Equipment Utilized During Treatment floatation equipment, water dumbbells    Activity Tolerance Patient tolerated treatment well    Behavior During Therapy WFL for tasks assessed/performed             Past Medical History:  Diagnosis Date   CAD (coronary artery disease) of bypass graft    CABG x 4   Glaucoma    H/O: CVA (cerebrovascular accident) 2022   Hyperlipidemia LDL goal <70    Hypertension     Past Surgical History:  Procedure Laterality Date   CORONARY ARTERY BYPASS GRAFT N/A 07/08/2020   Procedure: CORONARY ARTERY BYPASS GRAFTING (CABG), ON PUMP, TIMES FOUR, LEFT INTERNAL MAMMARY ARTERY TO LAD, RIGHT SVG TO PDA, DISTAL CIRCUMFLEX, AND OM1;  Surgeon: Delight Ovens, MD;  Location: [facility];  Service: Open Heart Surgery;  Laterality: N/A;   ENDOVEIN HARVEST OF GREATER SAPHENOUS VEIN Right 07/08/2020   Procedure: ENDOVEIN HARVEST OF RIGHT GREATER SAPHENOUS VEIN;  Surgeon: Delight Ovens, MD;  Location: [facility];  Service: Open Heart Surgery;  Laterality: Right;   LEFT HEART CATH AND CORONARY ANGIOGRAPHY N/A 07/02/2020   Procedure: LEFT HEART CATH AND CORONARY ANGIOGRAPHY;  Surgeon: Corky Crafts, MD;  Location: [facility];  Service: Cardiovascular;  Laterality: N/A;   NO PAST SURGERIES     TEE WITHOUT CARDIOVERSION N/A 07/08/2020   Procedure: TRANSESOPHAGEAL ECHOCARDIOGRAM (TEE);  Surgeon:  Delight Ovens, MD;  Location[facility];  Service: Open Heart Surgery;  Laterality: N/A;    There were no vitals filed for this visit.   Subjective Assessment - 12/07/20 1312     Subjective  Patient and wife report having MRI - radiology indicated RC tear, MD did not see tear - but gave injection and list of exercises to address frozen shoulder    Currently in Pain? Yes    Pain Score 1     Pain Location Shoulder    Pain Orientation Left    Pain Descriptors / Indicators Aching    Pain Type Chronic pain    Pain Onset More than a month ago    Pain Frequency Constant    Aggravating Factors  dependent position, end range shoulder - especially flexion/abd/internal rotation    Pain Relieving Factors heat, pool therapy    Multiple Pain Sites No              Patient seen for aquatic therapy visit.  Patient entered and exited pool via stairs independently.   Session occurred in 3.5-4.5 ft of water.   Started patient at 4 feet against wall for support to allow free movement in BUE and to emphasize postural control.  Patient with limited ability for thoracic extension with scapular depression.  Patient still with numbness and tightness at sternal incision.  Patient with tightness throughout rib cage and  thoracic and lumbar spine.  This is felt to contribute to shoulder pain and stiffness.   Worked away from wall to address shoulder extension, and extension with internal rotation as needed to tuck in shirt, pull up slacks.   Worked with dumbbell (1) to begin to build strength in shoulder flex/ext/abd/add/ER/IR, and elbow flex/ext.   Followed with floatation for apssive stretch to scapula, GH joint.  Patient responds well with traction and assist to guide scap into depression, maintain humeral rotation.   Patient ended session at 0/10 pain, and able to actively bring left arm overhead into abduction while supine without pain.                         OT Short Term Goals -  12/07/20 1318       OT SHORT TERM GOAL #1   Title Patient will complete an HEP designed to improve grip strength in left hand    Time 4    Period Weeks    Status Achieved      OT SHORT TERM GOAL #2   Title Patient will complete an HEP designed to improve range of motion and decrease pain in left shoulder    Time 4    Period Weeks    Status Achieved      OT SHORT TERM GOAL #3   Title Patient will report greater ease in  washing lower legs and donning socks.    Time 4    Period Weeks    Status Achieved   pt reports greater ease. 09/22/20     OT SHORT TERM GOAL #4   Title Patient will report improved ability to get onto and off of lawn mower (management of left leg)    Time 4    Period Weeks    Status On-going               OT Long Term Goals - 12/07/20 1318       OT LONG TERM GOAL #1   Title Patient will complete an updated HEP to address LUE functioning    Time 8    Period Weeks    Status On-going      OT LONG TERM GOAL #2   Title Patient will demonstrate 5 lb increase in left grip strength to improve functional grasp    Time 4    Period Weeks    Status Achieved   40lbs with LUE 10/06/20     OT LONG TERM GOAL #3   Title Patient will reach overhead to obtain and place an object no heavier than 2 lbs x 3 without increased pain.    Time 8    Period Weeks    Status On-going      OT LONG TERM GOAL #4   Title Patient and wife will demonstrate understanding of return to driving recommendations    Time 8    Period Weeks    Status On-going      OT LONG TERM GOAL #5   Title Patient and wife will demonstrate understanding of return to work recommendations    Time 8    Period Weeks    Status On-going                   Plan - 12/07/20 1316     Clinical Impression Statement Patient feels shoulder may be slowly getting better.  Received another injection last week - still feeling some relief.  Able  to sleep on left side with less discomfort.  Responding  well to aquatic therapy.    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 ADL;Coordination;Endurance;GMC;Muscle spasms;UE functional use;Vestibular;Decreased knowledge of precautions;Balance;Body mechanics;Decreased knowledge of use of DME;Flexibility;IADL;Pain;Strength;FMC;Dexterity;Cardiopulmonary status limiting activity;Mobility;ROM;Tone    Cognitive Skills Attention;Emotional;Energy/Drive;Learn;Memory;Temperament/Personality;Sequencing;Safety Awareness;Problem Solve;Perception    Rehab Potential Good    Clinical Decision Making Several treatment options, min-mod task modification necessary    Comorbidities Affecting Occupational Performance: May have comorbidities impacting occupational performance    Modification or Assistance to Complete Evaluation  Min-Moderate modification of tasks or assist with assess necessary to complete eval    OT Frequency 1x / week    OT Duration 8 weeks    OT Treatment/Interventions Self-care/ADL training;Electrical Stimulation;Therapeutic exercise;Patient/family education;Splinting;Neuromuscular education;Paraffin;Moist Heat;Aquatic Therapy;Fluidtherapy;Energy conservation;Building services engineer;Therapeutic activities;Balance training;Cryotherapy;Ultrasound;Contrast Bath;DME and/or AE instruction;Manual Therapy;Passive range of motion;Cognitive remediation/compensation    Plan continue addressing shoulder pain, pool for shoulder    Consulted and Agree with Plan of Care Patient    Family Member Consulted Wife Carla             Patient will benefit from skilled therapeutic intervention in order to improve the following deficits and impairments:   Body Structure /  Function / Physical Skills: ADL, Coordination, Endurance, GMC, Muscle spasms, UE functional use, Vestibular, Decreased knowledge of precautions, Balance, Body mechanics, Decreased knowledge of use of DME, Flexibility, IADL, Pain, Strength, FMC, Dexterity, Cardiopulmonary status limiting activity, Mobility, ROM, Tone Cognitive Skills: Attention, Emotional, Energy/Drive, Learn, Memory, Temperament/Personality, Sequencing, Safety Awareness, Problem Solve, Perception     Visit Diagnosis: Hemiplegia and hemiparesis following cerebral infarction affecting left non-dominant side (HCC)  Acute pain of left shoulder  Muscle weakness (generalized)  Unsteadiness on feet  Attention and concentration deficit    Problem List Patient Active Problem List   Diagnosis Date Noted   Orthostatic syncope 08/06/2020   CVA (cerebral vascular accident) ([facility]) 07/21/2020   Acute CVA (cerebrovascular accident) ([facility]) 07/20/2020   Glaucoma    Hypertension    S/P CABG x 4    Coronary artery disease 07/08/2020   NSTEMI (non-ST elevated myocardial infarction) ([facility]) 07/02/2020   Hypertensive emergency 07/01/2020    Collier Salina 12/07/2020, 1:19 PM  [facility] [address] [place], [place], [address] Phone: [phone]   Fax:  [phone]  Name: SAAHIR PRUDE MRN: [id number] Date of Birth: 07-29-1965

## 2020-12-14 ENCOUNTER — Encounter: Payer: Self-pay | Admitting: Occupational Therapy

## 2020-12-14 ENCOUNTER — Ambulatory Visit: Payer: BC Managed Care – PPO | Admitting: Occupational Therapy

## 2020-12-14 DIAGNOSIS — R41841 Cognitive communication deficit: Secondary | ICD-10-CM | POA: Diagnosis not present

## 2020-12-14 NOTE — Therapy (Signed)
[facility] [address] [place], [place], [address] Phone: [phone]   Fax:  [phone]  Occupational Therapy Treatment  Patient Details  Name: Leonard Donovan MRN: [id number] Date of Birth: 1965-07-19 Referring Provider (OT): Ihor Austin   Encounter Date: 12/14/2020   OT End of Session - 12/14/20 1524     Visit Number 18    Number of Visits 24    Date for OT Re-Evaluation 01/30/21    Authorization Type BCBS:  VL:[place]    OT Start Time 1215    OT Stop Time 1300    OT Time Calculation (min) 45 min    Equipment Utilized During Treatment floatation equipment, water dumbbells    Activity Tolerance Patient tolerated treatment well    Behavior During Therapy WFL for tasks assessed/performed             Past Medical History:  Diagnosis Date   CAD (coronary artery disease) of bypass graft    CABG x 4   Glaucoma    H/O: CVA (cerebrovascular accident) 2022   Hyperlipidemia LDL goal <70    Hypertension     Past Surgical History:  Procedure Laterality Date   CORONARY ARTERY BYPASS GRAFT N/A 07/08/2020   Procedure: CORONARY ARTERY BYPASS GRAFTING (CABG), ON PUMP, TIMES FOUR, LEFT INTERNAL MAMMARY ARTERY TO LAD, RIGHT SVG TO PDA, DISTAL CIRCUMFLEX, AND OM1;  Surgeon: Delight Ovens, MD;  Location: [facility] OR;  Service: Open Heart Surgery;  Laterality: N/A;   ENDOVEIN HARVEST OF GREATER SAPHENOUS VEIN Right 07/08/2020   Procedure: ENDOVEIN HARVEST OF RIGHT GREATER SAPHENOUS VEIN;  Surgeon: Delight Ovens, MD;  Location: [facility] OR;  Service: Open Heart Surgery;  Laterality: Right;   LEFT HEART CATH AND CORONARY ANGIOGRAPHY N/A 07/02/2020   Procedure: LEFT HEART CATH AND CORONARY ANGIOGRAPHY;  Surgeon: Corky Crafts, MD;  Location: [facility] INVASIVE CV LAB;  Service: Cardiovascular;  Laterality: N/A;   NO PAST SURGERIES     TEE WITHOUT CARDIOVERSION N/A 07/08/2020   Procedure: TRANSESOPHAGEAL ECHOCARDIOGRAM (TEE);  Surgeon:  Delight Ovens, MD;  Location: [facility] OR;  Service: Open Heart Surgery;  Laterality: N/A;    There were no vitals filed for this visit.   Subjective Assessment - 12/14/20 1524     Subjective  I feel like its getting better.  It hurt one time this week when I was chasing a chicken    Currently in Pain? No/denies    Pain Score 0-No pain             Patient seen for aquatic therapy visit.  Patient entered and exited pool via stairs independently   Session occurred in 3.5-4.5 ft of water.  Worked initially on use of water dumbbells to support weight of LUE and to allow freedom of forward and lateral reach patterns.  Patient without report of pain.   Worked on resistive patterns - shoulder flexion with elbow extension, shoulder abd/adduction, IR/ER.   In supine focused on shoulder ext and IR with elbow flexion in very small active ranges to help with dressing tasks such as tucking in shirt, putting belt thru loop.                         OT Short Term Goals - 12/14/20 1526       OT SHORT TERM GOAL #1   Title Patient will complete an HEP designed to improve grip strength in left hand  Time 4    Period Weeks    Status Achieved      OT SHORT TERM GOAL #2   Title Patient will complete an HEP designed to improve range of motion and decrease pain in left shoulder    Time 4    Period Weeks    Status Achieved      OT SHORT TERM GOAL #3   Title Patient will report greater ease in  washing lower legs and donning socks.    Time 4    Period Weeks    Status Achieved   pt reports greater ease. 09/22/20     OT SHORT TERM GOAL #4   Title Patient will report improved ability to get onto and off of lawn mower (management of left leg)    Time 4    Period Weeks    Status On-going               OT Long Term Goals - 12/14/20 1526       OT LONG TERM GOAL #1   Title Patient will complete an updated HEP to address LUE functioning    Time 8    Period Weeks     Status On-going      OT LONG TERM GOAL #2   Title Patient will demonstrate 5 lb increase in left grip strength to improve functional grasp    Time 4    Period Weeks    Status Achieved   40lbs with LUE 10/06/20     OT LONG TERM GOAL #3   Title Patient will reach overhead to obtain and place an object no heavier than 2 lbs x 3 without increased pain.    Time 8    Period Weeks    Status On-going      OT LONG TERM GOAL #4   Title Patient and wife will demonstrate understanding of return to driving recommendations    Time 8    Period Weeks    Status On-going      OT LONG TERM GOAL #5   Title Patient and wife will demonstrate understanding of return to work recommendations    Time 8    Period Weeks    Status On-going                   Plan - 12/14/20 1525     Clinical Impression Statement Patient reports shoulder is getting better.  Still limited and painful at end range.  Mostly limited with internal rotation with extension and elbow flexion    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 ADL;Coordination;Endurance;GMC;Muscle spasms;UE functional use;Vestibular;Decreased knowledge of precautions;Balance;Body mechanics;Decreased knowledge of use of DME;Flexibility;IADL;Pain;Strength;FMC;Dexterity;Cardiopulmonary status limiting activity;Mobility;ROM;Tone    Cognitive Skills Attention;Emotional;Energy/Drive;Learn;Memory;Temperament/Personality;Sequencing;Safety Awareness;Problem Solve;Perception    Rehab Potential Good    Clinical Decision Making Several treatment options, min-mod task modification necessary    Comorbidities Affecting Occupational Performance: May have comorbidities  impacting occupational performance    Modification or Assistance to Complete Evaluation  Min-Moderate modification of tasks or assist with assess necessary to complete eval    OT Frequency 1x / week    OT Duration 8 weeks    OT Treatment/Interventions Self-care/ADL training;Electrical Stimulation;Therapeutic exercise;Patient/family education;Splinting;Neuromuscular education;Paraffin;Moist Heat;Aquatic Therapy;Fluidtherapy;Energy conservation;Building services engineer;Therapeutic activities;Balance training;Cryotherapy;Ultrasound;Contrast  Bath;DME and/or AE instruction;Manual Therapy;Passive range of motion;Cognitive remediation/compensation    Plan continue addressing shoulder pain, pool for shoulder - range of motion ad strengthening    Consulted and Agree with Plan of Care Patient    Family Member Consulted Wife Carla             Patient will benefit from skilled therapeutic intervention in order to improve the following deficits and impairments:   Body Structure / Function / Physical Skills: ADL, Coordination, Endurance, GMC, Muscle spasms, UE functional use, Vestibular, Decreased knowledge of precautions, Balance, Body mechanics, Decreased knowledge of use of DME, Flexibility, IADL, Pain, Strength, FMC, Dexterity, Cardiopulmonary status limiting activity, Mobility, ROM, Tone Cognitive Skills: Attention, Emotional, Energy/Drive, Learn, Memory, Temperament/Personality, Sequencing, Safety Awareness, Problem Solve, Perception     Visit Diagnosis: Hemiplegia and hemiparesis following cerebral infarction affecting left non-dominant side (HCC)  Acute pain of left shoulder  Muscle weakness (generalized)  Unsteadiness on feet    Problem List Patient Active Problem List   Diagnosis Date Noted   Orthostatic syncope 08/06/2020   CVA (cerebral vascular accident) (HCC) 07/21/2020   Acute CVA (cerebrovascular accident) (HCC) 07/20/2020   Glaucoma    Hypertension    S/P CABG x 4     Coronary artery disease 07/08/2020   NSTEMI (non-ST elevated myocardial infarction) (HCC) 07/02/2020   Hypertensive emergency 07/01/2020    Collier Salina 12/14/2020, 3:27 PM  [facility] [address] [place], [place], [address] Phone: [phone]   Fax:  [phone]  Name: ARAEL PICCIONE MRN: [id number] Date of Birth: 06-10-1965

## 2020-12-15 ENCOUNTER — Ambulatory Visit: Payer: BC Managed Care – PPO

## 2020-12-15 ENCOUNTER — Other Ambulatory Visit: Payer: Self-pay

## 2020-12-15 DIAGNOSIS — R41841 Cognitive communication deficit: Secondary | ICD-10-CM

## 2020-12-15 DIAGNOSIS — R471 Dysarthria and anarthria: Secondary | ICD-10-CM

## 2020-12-15 DIAGNOSIS — R4701 Aphasia: Secondary | ICD-10-CM

## 2020-12-15 NOTE — Progress Notes (Signed)
Cardiology Clinic Note   Patient Name: Leonard Donovan Date of Encounter: 12/16/2020  Primary Care Provider:  Gara Kroner, DO Primary Cardiologist:  Leonard Ruths, Donovan  Patient Profile    Leonard Donovan 55 year old male presents to the clinic today for follow-up evaluation of his hypertension and coronary artery disease.  Past Medical History    Past Medical History:  Diagnosis Date   CAD (coronary artery disease) of bypass graft    CABG x 4   Glaucoma    H/O: CVA (cerebrovascular accident) 2022   Hyperlipidemia LDL goal <70    Hypertension    Past Surgical History:  Procedure Laterality Date   CORONARY ARTERY BYPASS GRAFT N/A 07/08/2020   Procedure: CORONARY ARTERY BYPASS GRAFTING (CABG), ON PUMP, TIMES FOUR, LEFT INTERNAL MAMMARY ARTERY TO LAD, RIGHT SVG TO PDA, DISTAL CIRCUMFLEX, AND OM1;  Surgeon: Leonard Isaac, Donovan;  Location: [place];  Service: Open Heart Surgery;  Laterality: N/A;   ENDOVEIN HARVEST OF GREATER SAPHENOUS VEIN Right 07/08/2020   Procedure: ENDOVEIN HARVEST OF RIGHT GREATER SAPHENOUS VEIN;  Surgeon: Leonard Isaac, Donovan;  Location: [place];  Service: Open Heart Surgery;  Laterality: Right;   LEFT HEART CATH AND CORONARY ANGIOGRAPHY N/A 07/02/2020   Procedure: LEFT HEART CATH AND CORONARY ANGIOGRAPHY;  Surgeon: Leonard Booze, Donovan;  Location: [place] CV LAB;  Service: Cardiovascular;  Laterality: N/A;   NO PAST SURGERIES     TEE WITHOUT CARDIOVERSION N/A 07/08/2020   Procedure: TRANSESOPHAGEAL ECHOCARDIOGRAM (TEE);  Surgeon: Leonard Isaac, Donovan;  Location: [place];  Service: Open Heart Surgery;  Laterality: N/A;    Allergies  No Known Allergies  History of Present Illness    Leonard Donovan has a PMH of primary hypertension, coronary artery disease, and pure hypercholesterolemia.  He was ruled in for NSTEMI myocardial infarction 2/22.  He underwent cardiac catheterization which showed severe multivessel coronary artery disease.  His echocardiogram  showed normal LVEF.  He underwent coronary artery bypass graft with LIMA-LAD, SVG-PDA, SVG-distal circumflex, and SVG-OM1.  His preoperative carotid Doppler showed 40-59% right carotid stenosis.  Post discharge he returned due to slurred speech and left upper extremity weakness and was found to have had CVA.  His repeat echocardiogram showed normal LV function.  An outpatient cardiac event monitor 4/22 showed sinus rhythm and no atrial fibrillation.  Following his discharge he was seen in the office with 3 episodes of syncope.  They were felt to be orthostatic in nature.  He did have 1 episode in the office and the systolic blood pressure dropped to the 80s.  His medications were discontinued.  His medications were then gradually increased as an outpatient.  He was seen by Leonard Donovan on 10/16/2020.  During that time he did note some dyspnea on exertion but no orthopnea PND, lower extremity swelling, exertional chest pain or syncope.  His blood pressure at home was running in the 150-1 160/100 range.  He presents the clinic today for follow-up evaluation states he feels well.  His wife joins via Dietitian.  He has just completed cardiac rehab and is walking on the treadmill 2-3 times per week for about 20 minutes at a time.  He is maintaining a heart healthy diet.  He was started on chlorthalidone and Zetia.  He reports constipation and erectile dysfunction.  He stopped his chlorthalidone about 1 week ago and has not had further episodes of constipation.  We will trial him off his Zetia  to see if he ED resolves.  His blood pressure is well controlled in the morning but elevates throughout the day.  I will stop his losartan and place him on valsartan 160 mg daily.  We will have him maintain a blood pressure log and have been met in 1 week.  I will give him salty 6 diet sheet, high-fiber recommendations, and have him follow-up in 1 to 2 months.  Today he denies chest pain, shortness of breath, lower extremity  edema, fatigue, palpitations, melena, hematuria, hemoptysis, diaphoresis, weakness, presyncope, syncope, orthopnea, and PND.   Home Medications    Prior to Admission medications   Medication Sig Start Date End Date Taking? Authorizing Provider  acetaminophen (TYLENOL) 500 MG tablet Take 2 tablets (1,000 mg total) by mouth every 8 (eight) hours as needed for mild pain (or discomfort). 07/22/20   Leonard Donovan  amLODipine (NORVASC) 10 MG tablet Take 1 tablet (10 mg total) by mouth daily. 10/05/20 01/03/21  Leonard Perla, Donovan  aspirin EC 81 MG EC tablet Take 1 tablet (81 mg total) by mouth daily. Swallow whole. 07/11/20   Leonard Skillern, Leonard Donovan  atorvastatin (LIPITOR) 80 MG tablet Take 1 tablet (80 mg total) by mouth daily. 09/08/20   Leonard Perla, Donovan  carvedilol (COREG) 6.25 MG tablet Take 1 tablet (6.25 mg total) by mouth 2 (two) times daily. 10/16/20   Leonard Perla, Donovan  chlorthalidone (HYGROTON) 25 MG tablet Take 1 tablet (25 mg total) by mouth daily. 10/16/20 01/14/21  Leonard Perla, Donovan  clopidogrel (PLAVIX) 75 MG tablet Take 1 tablet (75 mg total) by mouth daily. 09/08/20   Leonard Perla, Donovan  ezetimibe (ZETIA) 10 MG tablet Take 1 tablet (10 mg total) by mouth daily. 10/29/20   Leonard Perla, Donovan  losartan (COZAAR) 100 MG tablet Take 1 tablet (100 mg total) by mouth daily. 09/02/20 12/01/20  Leonard Perla, Donovan  Multiple Vitamins-Minerals (MULTIVITAMIN WITH MINERALS) tablet Take 1 tablet by mouth daily.    Provider, Historical, Donovan  nitroGLYCERIN (NITROSTAT) 0.4 MG SL tablet Place 1 tablet (0.4 mg total) under the tongue every 5 (five) minutes as needed. 08/07/20 11/05/20  Leonard Quan, Leonard Donovan    Family History    Family History  Problem Relation Age of Onset   Hypertension Father    CAD Sister        diagnosed in her 35s   Heart attack Sister    He indicated that the status of his father is unknown. He indicated that the status of his sister is unknown.  Social  History    Social History   Socioeconomic History   Marital status: Married    Spouse name: Not on file   Number of children: Not on file   Years of education: Not on file   Highest education level: Not on file  Occupational History   Not on file  Tobacco Use   Smoking status: Former   Smokeless tobacco: Former  Scientific laboratory technician Use: Never used  Substance and Sexual Activity   Alcohol use: Not Currently   Drug use: Not Currently   Sexual activity: Not on file  Other Topics Concern   Not on file  Social History Narrative   Not on file   Social Determinants of Health   Financial Resource Strain: Not on file  Food Insecurity: Not on file  Transportation Needs: Not on file  Physical Activity: Not on file  Stress: Not on file  Social Connections: Not on file  Intimate Partner Violence: Not on file     Review of Systems    General:  No chills, fever, night sweats or weight changes.  Cardiovascular:  No chest pain, dyspnea on exertion, edema, orthopnea, palpitations, paroxysmal nocturnal dyspnea. Dermatological: No rash, lesions/masses Respiratory: No cough, dyspnea Urologic: No hematuria, dysuria Abdominal:   No nausea, vomiting, diarrhea, bright red blood per rectum, melena, or hematemesis Neurologic:  No visual changes, wkns, changes in mental status. All other systems reviewed and are otherwise negative except as noted above.  Physical Exam    VS:  BP (!) 150/90 (BP Location: Left Arm)   Pulse (!) 103   Ht _0  (1.778 m)   Wt 223 lb 12.8 oz (101.5 kg)   SpO2 97%   BMI 32.11 kg/m  , BMI Body mass index is 32.11 kg/m. GEN: Well nourished, well developed, in no acute distress. HEENT: normal. Neck: Supple, no JVD, carotid bruits, or masses. Cardiac: RRR, no murmurs, rubs, or gallops. No clubbing, cyanosis, edema.  Radials/DP/PT 2+ and equal bilaterally.  Respiratory:  Respirations regular and unlabored, clear to auscultation bilaterally. GI: Soft,  nontender, nondistended, BS + x 4. MS: no deformity or atrophy. Skin: warm and dry, no rash. Neuro:  Strength and sensation are intact. Psych: Normal affect.  Accessory Clinical Findings    Recent Labs: 07/09/2020: Magnesium 2.5 07/29/2020: Hemoglobin 13.0; Platelets 258; TSH 6.020 10/27/2020: ALT 10; BUN 27; Creatinine, Ser 1.47; Potassium 4.0; Sodium 141   Recent Lipid Panel    Component Value Date/Time   CHOL 149 10/27/2020 0923   TRIG 131 10/27/2020 0923   HDL 37 (L) 10/27/2020 0923   CHOLHDL 4.0 10/27/2020 0923   CHOLHDL 4.3 07/21/2020 0500   VLDL 30 07/21/2020 0500   LDLCALC 89 10/27/2020 0923    ECG personally reviewed by me today-none today.  Echocardiogram 07/21/2020  IMPRESSIONS     1. Left ventricular ejection fraction, by estimation, is 60 to 65%. The  left ventricle has normal function. The left ventricle has no regional  wall motion abnormalities. There is mild concentric left ventricular  hypertrophy. Left ventricular diastolic  parameters are indeterminate.   2. Right ventricular systolic function is low normal. The right  ventricular size is normal.   3. Left atrial size was mildly dilated.   4. The mitral valve is normal in structure. Trivial mitral valve  regurgitation.   5. The aortic valve is tricuspid. Aortic valve regurgitation is not  visualized. No aortic stenosis is present.   6. The inferior vena cava is normal in size with greater than 50%  respiratory variability, suggesting right atrial pressure of 3 mmHg.   Comparison(s): No significant change from prior study.   Conclusion(s)/Recommendation(s): No intracardiac source of embolism  detected on this transthoracic study. A transesophageal echocardiogram is  recommended to exclude cardiac source of embolism if clinically indicated.  Assessment & Plan   1.  Hypertension-BP today 150/90.  Controlled in the morning at home and slowly elevates through the day.  Reports 120/80 in the morning and  140 over 90s in the afternoon.  Previous multiple episodes of orthostasis.  Felt to be related to blood pressure following bypass surgery.  Was not able to tolerate Continue carvedilol, amlodipine,  Stop losartan Start valsartan 160 mg daily Maintain blood pressure log Lower extremity support stockings Order BMP Maintain blood pressure log Heart healthy low-sodium diet-salty 6 given  Coronary artery disease-denies chest  pain.  Status post CABG, details above. Continue aspirin, Plavix, statin-plan to discontinue statin in February. Heart healthy low-sodium diet-salty 6 given Increase physical activity as tolerated  Hyperlipidemia/carotid artery disease-07/21/2020: VLDL 30 10/27/2020: Cholesterol, Total 149; HDL 37; LDL Chol Calc (NIH) 89; Triglycerides 131 Continue statin therapy, aspirin,  Heart healthy low-sodium high-fiber diet.   Increase physical activity as tolerated Stop ezetimibe-trial off Zetia to see if it is causing ED. Repeat carotid Dopplers 2/23  Previous CVA-cardiac event monitor showed no atrial fibrillation.  Denies recent episodes of irregular or skipped heartbeats. Avoid triggers caffeine, chocolate, EtOH, dehydration etc. Continue aspirin, Plavix  Disposition: Follow-up with Leonard Donovan or APP in 2 months.  Jossie Ng. Carol Theys NP-C    12/16/2020, 3:29 PM [facility] [place] [address] Office [phone] Fax [phone]  Notice: This dictation was prepared with Dragon dictation along with smaller phrase technology. Any transcriptional errors that result from this process are unintentional and may not be corrected upon review.  I spent 13 minutes examining this patient, reviewing medications, and using patient centered shared decision making involving her cardiac care.  Prior to her visit I spent greater than 20 minutes reviewing her past medical history,  medications, and prior cardiac tests.

## 2020-12-15 NOTE — Therapy (Signed)
[facility] [address] [place], [place], [address] Phone: [phone]   Fax:  [phone]  Speech Language Pathology Treatment  Patient Details  Name: Leonard Donovan MRN: [id number] Date of Birth: Nov 11, 1965 Referring Provider (SLP): Frann Rider NP   Encounter Date: 12/15/2020   End of Session - 12/15/20 1201     Visit Number 26    Number of Visits 30    Date for SLP Re-Evaluation 01/14/21    Authorization Type BCBS    SLP Start Time 1147    SLP Stop Time  9449    SLP Time Calculation (min) 43 min    Activity Tolerance Patient tolerated treatment well             Past Medical History:  Diagnosis Date   CAD (coronary artery disease) of bypass graft    CABG x 4   Glaucoma    H/O: CVA (cerebrovascular accident) 2022   Hyperlipidemia LDL goal <70    Hypertension     Past Surgical History:  Procedure Laterality Date   CORONARY ARTERY BYPASS GRAFT N/A 07/08/2020   Procedure: CORONARY ARTERY BYPASS GRAFTING (CABG), ON PUMP, TIMES FOUR, LEFT INTERNAL MAMMARY ARTERY TO LAD, RIGHT SVG TO PDA, DISTAL CIRCUMFLEX, AND OM1;  Surgeon: Grace Isaac, MD;  Location: [facility];  Service: Open Heart Surgery;  Laterality: N/A;   ENDOVEIN HARVEST OF GREATER SAPHENOUS VEIN Right 07/08/2020   Procedure: ENDOVEIN HARVEST OF RIGHT GREATER SAPHENOUS VEIN;  Surgeon: Grace Isaac, MD;  Location: [facility];  Service: Open Heart Surgery;  Laterality: Right;   LEFT HEART CATH AND CORONARY ANGIOGRAPHY N/A 07/02/2020   Procedure: LEFT HEART CATH AND CORONARY ANGIOGRAPHY;  Surgeon: Jettie Booze, MD;  Location: [facility];  Service: Cardiovascular;  Laterality: N/A;   NO PAST SURGERIES     TEE WITHOUT CARDIOVERSION N/A 07/08/2020   Procedure: TRANSESOPHAGEAL ECHOCARDIOGRAM (TEE);  Surgeon: Grace Isaac, MD;  Location: [facility];  Service: Open Heart Surgery;  Laterality: N/A;    There were no vitals filed for this  visit.   Subjective Assessment - 12/15/20 1150     Subjective "I had vertigo this weekend"    Currently in Pain? Yes    Pain Location Shoulder                   ADULT SLP TREATMENT - 12/15/20 1150       General Information   Behavior/Cognition Alert;Cooperative;Pleasant mood;Distractible      Treatment Provided   Treatment provided Cognitive-Linquistic      Cognitive-Linquistic Treatment   Treatment focused on Aphasia;Cognition;Dysarthria;Patient/family/caregiver education    Skilled Treatment Pt c/o recent vertigo over weekend, with residual dizziness and lethargy reported this session. Pt reports slower processing today. Pt expressed two episodes of concern since last ST session, including leaving out item from fridge and forgetting what he was looking for. SLP educated patient on strategies to aid memory/attention, including mental repetition and writing down items. Pt completed naming HEP with rare A required and some additional processing time. Pt indicated overall improved swallow function, with no recent coughing episodes reported. SLP targeted high level divergent naming, in which pt required occasional min semantic cues  for naming. Rare reduced speech intelligibility exhibited in conversation this session.      Assessment / Recommendations / Plan   Plan Continue with current plan of care;Goals updated      Progression Toward Goals   Progression toward  goals Progressing toward goals              SLP Education - 12/15/20 1158     Education Details mental picture/repetition, writing down information, HEP    Person(s) Educated Patient    Methods Explanation;Demonstration;Handout    Comprehension Verbalized understanding;Returned demonstration;Need further instruction              SLP Short Term Goals - 10/13/20 1722       SLP SHORT TERM GOAL #1   Title Pt will verbalize functional application of 2 memory/attention strategies for home/work  environments with occasional min A over 2 sessions    Baseline 10-06-20    Period Weeks   or 9 total visits for all STGs   Status Partially Met      SLP SHORT TERM GOAL #2   Title Pt will demonstrate speech compensations for 100% intelligibility in 10 simple conversation with rare min A over 2 sessions    Baseline 10-06-20    Status Partially Met      SLP SHORT TERM GOAL #3   Title Pt will verbalize and demonstrate recommended swallow strategies in therapy and at home with min A over 2 sessions    Baseline 09-29-20, 10-08-20    Status Achieved      SLP SHORT TERM GOAL #4   Title Pt will be able to verbalize three non-physical deficits by giving examples from home or within ST session in 2 sessions    Baseline 09-29-20    Status Partially Met              SLP Long Term Goals - 12/15/20 1201       SLP LONG TERM GOAL #1   Title Pt will verbalize functional application of 3 memory/attention strategies for home/work environments with rare min A over 2 sessions    Baseline 10-22-20, 12-03-20    Period --   or 25 total visits for all LTGs   Status Achieved      SLP LONG TERM GOAL #2   Title Pt will demonstrate speech compensations for 100% intelligibility in 30 mod complex conversation with rare min A over 3 sessions    Baseline 12-15-20    Time 5    Period Weeks    Status On-going      SLP LONG TERM GOAL #3   Title Pt and wife will report consistent carryover of recommended swallow strategies and reduced s/sx of aspiration over 3 sessions    Baseline 10-15-20, 11-03-20, 11-05-20    Status Achieved      SLP LONG TERM GOAL #4   Title Pt will demonstrate improved insight and awareness of current deficits and impact on his performance with both work and home environments with rare min A over 2 sessions    Baseline 10-20-20, 11-03-20    Status Achieved      SLP LONG TERM GOAL #5   Title Pt will verbalize how to organize/plan/sequence day to day home/work tasks for increased functional  independence with occasional min A over 2 sessions    Baseline 11-26-20, 12-03-20    Status Achieved      SLP LONG TERM GOAL #6   Title Pt will utilize word finding compensations when anomia/dysnomia occurs for 4/5 opportunities with rare min A over 2 sessions    Time 5    Period Weeks    Status On-going      SLP LONG TERM GOAL #7   Title Pt will verbalize awareness  of when/why swallow difficulty occurs and demonstrate how to implement swallow strategy to reduce occurance for 4/5 opportunities over 2 sessions    Time 5    Period Weeks    Status On-going              Plan - 12/15/20 1220     Clinical Impression Statement Leonard Donovan is seen for ST intervention to address improving 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 SLP targeted word finding skills, in which pt benefited from occasional min semantic cues to aid word finding on structured tasks. See "skilled treatment" for additional details of today's session. Pt would continue to benefit from skilled ST intervention to address cognitive communication, aphasia, dysarthria, and monitor dysphagia to aid patient in return to PLOF and work, reduce communication breakdown, and decrease aspiration risk.    Speech Therapy Frequency 1x /week    Duration Other (comment)   for 6 weeks or 6 more visits   Treatment/Interventions Compensatory strategies;Patient/family education;Functional tasks;Cueing hierarchy;Multimodal communcation approach;Cognitive reorganization;Environmental controls;Diet toleration management by SLP;Aspiration precaution training;Compensatory techniques;Internal/external aids;SLP instruction and feedback    Potential to Achieve Goals Fair    Potential Considerations Previous level of function    SLP Home Exercise Plan provided    Consulted and Agree with Plan of Care Patient             Patient will benefit from skilled therapeutic  intervention in order to improve the following deficits and impairments:   Aphasia  Dysarthria and anarthria  Cognitive communication deficit    Problem List Patient Active Problem List   Diagnosis Date Noted   Orthostatic syncope 08/06/2020   CVA (cerebral vascular accident) ([facility]) 07/21/2020   Acute CVA (cerebrovascular accident) ([facility]) 07/20/2020   Glaucoma    Hypertension    S/P CABG x 4    Coronary artery disease 07/08/2020   NSTEMI (non-ST elevated myocardial infarction) ([facility]) 07/02/2020   Hypertensive emergency 07/01/2020    Alinda Deem, [place] CCC-SLP 12/15/2020, 2:04 PM  [facility] [address] [place], [place], [address] Phone: [phone]   Fax:  [phone]   Name: Leonard Donovan MRN: [id number] Date of Birth: 1965-09-04

## 2020-12-15 NOTE — Patient Instructions (Signed)
  You have invited four people over for dinner on Friday night. Write a Research scientist (medical). Estimate the amount of money you will need.           List all the steps involved in planning a vacation.

## 2020-12-16 ENCOUNTER — Encounter: Payer: Self-pay | Admitting: General Practice

## 2020-12-16 ENCOUNTER — Ambulatory Visit (INDEPENDENT_AMBULATORY_CARE_PROVIDER_SITE_OTHER): Payer: BC Managed Care – PPO | Admitting: General Practice

## 2020-12-16 VITALS — BP 150/90 | HR 103 | Ht 70.0 in | Wt 223.8 lb

## 2020-12-16 DIAGNOSIS — E7800 Pure hypercholesterolemia, unspecified: Secondary | ICD-10-CM | POA: Diagnosis not present

## 2020-12-16 DIAGNOSIS — Z79899 Other long term (current) drug therapy: Secondary | ICD-10-CM

## 2020-12-16 DIAGNOSIS — I10 Essential (primary) hypertension: Secondary | ICD-10-CM

## 2020-12-16 DIAGNOSIS — I25810 Atherosclerosis of coronary artery bypass graft(s) without angina pectoris: Secondary | ICD-10-CM

## 2020-12-16 DIAGNOSIS — Z8673 Personal history of transient ischemic attack (TIA), and cerebral infarction without residual deficits: Secondary | ICD-10-CM | POA: Diagnosis not present

## 2020-12-16 MED ORDER — VALSARTAN 160 MG PO TABS: 160.0000 mg | ORAL_TABLET | Freq: Every day | ORAL | 3 refills | Status: DC

## 2020-12-16 NOTE — Patient Instructions (Addendum)
Medication Instructions:  STOP ZETIA STOP LOSARTAN STOP CHLORTHALIDONE  START VALSARTAN 160MG  DAILY *If you need a refill on your cardiac medications before your next appointment, please call your pharmacy*  Lab Work: BMET IN 1 WEEK-12-23-2020 If you have labs (blood work) drawn today and your tests are completely normal, you will receive your results only by:  MyChart Message (if you have MyChart) OR A paper copy in the mail.  If you have any lab test that is abnormal or we need to change your treatment, we will call you to review the results. You may go to any [facility] that is convenient for you however, we do have a lab in our office that is able to assist you. You DO NOT need an appointment for our lab. The lab is open 8:00am and closes at 4:00pm. Lunch 12:45 - 1:45pm.  Special Instructions TAKE AND LOG YOU BLOOD PRESSURE  PLEASE READ AND FOLLOW SALTY 6-ATTACHED-1,800 mg daily  TAKE AND LOG YOUR BLOOD PRESSURE AND BRING LOG BACK WITH YOU TO YOUR FOLLOW UP APPOINTMENT.  PLEASE READ AND FOLLOW INCREASED FIBER DIET-ATTACHED  Follow-Up: Your next appointment:  1-2 month(s) In Person with 12-25-2020, MD OR IF UNAVAILABLE JESSE CLEAVER, FNP-C   At [facility], you and your health needs are our priority.  As part of our continuing mission to provide you with exceptional heart care, we have created designated Provider Care Teams.  These Care Teams include your primary Cardiologist (physician) and Advanced Practice Providers (APPs -  Physician Assistants and Nurse Practitioners) who all work together to provide you with the care you need, when you need it.         High-Fiber Eating Plan Fiber, also called dietary fiber, is a type of carbohydrate. It is found foods such as fruits, vegetables, whole grains, and beans. A high-fiber diet can have many health benefits. Your health care provider may recommend a high-fiber diet to help: Prevent constipation. Fiber can make your bowel  movements more regular. Lower your cholesterol. Relieve the following conditions: Inflammation of veins in the anus (hemorrhoids). Inflammation of specific areas of the digestive tract (uncomplicated diverticulosis). A problem of the large intestine, also called the colon, that sometimes causes pain and diarrhea (irritable bowel syndrome, or IBS). Prevent overeating as part of a weight-loss plan. Prevent heart disease, type 2 diabetes, and certain cancers. What are tips for following this plan? Reading food labels  Check the nutrition facts label on food products for the amount of dietary fiber. Choose foods that have 5 grams of fiber or more per serving. The goals for recommended daily fiber intake include: Men (age 22 or younger): 34-38 g. Men (over age 22): 28-34 g. Women (age 75 or younger): 25-28 g. Women (over age 57): 22-25 g. Your daily fiber goal is _____________ g. Shopping Choose whole fruits and vegetables instead of processed forms, such as apple juice or applesauce. Choose a wide variety of high-fiber foods such as avocados, lentils, oats, and kidney beans. Read the nutrition facts label of the foods you choose. Be aware of foods with added fiber. These foods often have high sugar and sodium amounts per serving. Cooking Use whole-grain flour for baking and cooking. Cook with brown rice instead of white rice. Meal planning Start the day with a breakfast that is high in fiber, such as a cereal that contains 5 g of fiber or more per serving. Eat breads and cereals that are made with whole-grain flour instead of refined flour  or white flour. Eat brown rice, bulgur wheat, or millet instead of white rice. Use beans in place of meat in soups, salads, and pasta dishes. Be sure that half of the grains you eat each day are whole grains. General information You can get the recommended daily intake of dietary fiber by: Eating a variety of fruits, vegetables, grains, nuts, and  beans. Taking a fiber supplement if you are not able to take in enough fiber in your diet. It is better to get fiber through food than from a supplement. Gradually increase how much fiber you consume. If you increase your intake of dietary fiber too quickly, you may have bloating, cramping, or gas. Drink plenty of water to help you digest fiber. Choose high-fiber snacks, such as berries, raw vegetables, nuts, and popcorn. What foods should I eat? Fruits Berries. Pears. Apples. Oranges. Avocado. Prunes and raisins. Dried figs. Vegetables Sweet potatoes. Spinach. Kale. Artichokes. Cabbage. Broccoli. Cauliflower.Green peas. Carrots. Squash. Grains Whole-grain breads. Multigrain cereal. Oats and oatmeal. Brown rice. Barley.Bulgur wheat. Millet. Quinoa. Bran muffins. Popcorn. Rye wafer crackers. Meats and other proteins Navy beans, kidney beans, and pinto beans. Soybeans. Split peas. Lentils. Nutsand seeds. Dairy Fiber-fortified yogurt. Beverages Fiber-fortified soy milk. Fiber-fortified orange juice. Other foods Fiber bars. The items listed above may not be a complete list of recommended foods and beverages. Contact a dietitian for more information. What foods should I avoid? Fruits Fruit juice. Cooked, strained fruit. Vegetables Fried potatoes. Canned vegetables. Well-cooked vegetables. Grains White bread. Pasta made with refined flour. White rice. Meats and other proteins Fatty cuts of meat. Fried chicken or fried fish. Dairy Milk. Yogurt. Cream cheese. Sour cream. Fats and oils Butters. Beverages Soft drinks. Other foods Cakes and pastries. The items listed above may not be a complete list of foods and beverages to avoid. Talk with your dietitian about what choices are best for you. Summary Fiber is a type of carbohydrate. It is found in foods such as fruits, vegetables, whole grains, and beans. A high-fiber diet has many benefits. It can help to prevent constipation, lower  blood cholesterol, aid weight loss, and reduce your risk of heart disease, diabetes, and certain cancers. Increase your intake of fiber gradually. Increasing fiber too quickly may cause cramping, bloating, and gas. Drink plenty of water while you increase the amount of fiber you consume. The best sources of fiber include whole fruits and vegetables, whole grains, nuts, seeds, and beans. This information is not intended to replace advice given to you by your health care provider. Make sure you discuss any questions you have with your healthcare provider. Document Revised: 09/19/2019 Document Reviewed: 09/19/2019 Elsevier Patient Education  2022 [employer].

## 2020-12-21 ENCOUNTER — Ambulatory Visit: Payer: BC Managed Care – PPO | Admitting: Occupational Therapy

## 2020-12-21 ENCOUNTER — Ambulatory Visit: Payer: BC Managed Care – PPO

## 2020-12-21 ENCOUNTER — Other Ambulatory Visit: Payer: Self-pay

## 2020-12-21 DIAGNOSIS — R4701 Aphasia: Secondary | ICD-10-CM

## 2020-12-21 DIAGNOSIS — R41841 Cognitive communication deficit: Secondary | ICD-10-CM

## 2020-12-21 DIAGNOSIS — R471 Dysarthria and anarthria: Secondary | ICD-10-CM

## 2020-12-21 NOTE — Patient Instructions (Addendum)
  Figure out an alternative location to stay at [employer] out your fall gardening. Be specific with what you will need and list all the steps.

## 2020-12-21 NOTE — Therapy (Signed)
[facility] [address] [place], [place], [address] Phone: [phone]   Fax:  [phone]  Speech Language Pathology Treatment  Patient Details  Name: Leonard Donovan MRN: [id number] Date of Birth: 11-Feb-1966 Referring Provider (SLP): Frann Rider NP   Encounter Date: 12/21/2020   End of Session - 12/21/20 1244     Visit Number 27    Number of Visits 30    Date for SLP Re-Evaluation 01/14/21    Authorization Type BCBS    SLP Start Time 1233    SLP Stop Time  1324    SLP Time Calculation (min) 42 min    Activity Tolerance Patient tolerated treatment well             Past Medical History:  Diagnosis Date   CAD (coronary artery disease) of bypass graft    CABG x 4   Glaucoma    H/O: CVA (cerebrovascular accident) 2022   Hyperlipidemia LDL goal <70    Hypertension     Past Surgical History:  Procedure Laterality Date   CORONARY ARTERY BYPASS GRAFT N/A 07/08/2020   Procedure: CORONARY ARTERY BYPASS GRAFTING (CABG), ON PUMP, TIMES FOUR, LEFT INTERNAL MAMMARY ARTERY TO LAD, RIGHT SVG TO PDA, DISTAL CIRCUMFLEX, AND OM1;  Surgeon: Grace Isaac, MD;  Location: [facility];  Service: Open Heart Surgery;  Laterality: N/A;   ENDOVEIN HARVEST OF GREATER SAPHENOUS VEIN Right 07/08/2020   Procedure: ENDOVEIN HARVEST OF RIGHT GREATER SAPHENOUS VEIN;  Surgeon: Grace Isaac, MD;  Location: [facility];  Service: Open Heart Surgery;  Laterality: Right;   LEFT HEART CATH AND CORONARY ANGIOGRAPHY N/A 07/02/2020   Procedure: LEFT HEART CATH AND CORONARY ANGIOGRAPHY;  Surgeon: Jettie Booze, MD;  Location: [facility];  Service: Cardiovascular;  Laterality: N/A;   NO PAST SURGERIES     TEE WITHOUT CARDIOVERSION N/A 07/08/2020   Procedure: TRANSESOPHAGEAL ECHOCARDIOGRAM (TEE);  Surgeon: Grace Isaac, MD;  Location: [facility];  Service: Open Heart Surgery;  Laterality: N/A;    There were no vitals filed for this  visit.   Subjective Assessment - 12/21/20 1233     Subjective "tired and swimmy headed"    Currently in Pain? No/denies                   ADULT SLP TREATMENT - 12/21/20 1236       General Information   Behavior/Cognition Alert;Cooperative;Pleasant mood;Distractible      Treatment Provided   Treatment provided Cognitive-Linquistic      Cognitive-Linquistic Treatment   Treatment focused on Aphasia;Cognition;Dysarthria;Patient/family/caregiver education    Skilled Treatment Pt completed naming and executive functioning HEP with rare A from family members. SLP reviewed cueing hierarchy for family members to provide cues versus answers. SLP targeted word finding related to planning a vacation, with usual min questioning cues required to ID additional items/information necessary for trip. SLP provided min semantic cues x2 this session to aid word finding. Pt was 100% intelligible in conversation this session. Deviation in attention noted x2 during structured tasks.      Assessment / Recommendations / Plan   Plan Continue with current plan of care      Progression Toward Goals   Progression toward goals Progressing toward goals              SLP Education - 12/21/20 1249     Education Details HEP    Person(s) Educated Patient    Methods Explanation;Demonstration;Handout  Comprehension Verbalized understanding;Returned demonstration;Need further instruction              SLP Short Term Goals - 10/13/20 1722       SLP SHORT TERM GOAL #1   Title Pt will verbalize functional application of 2 memory/attention strategies for home/work environments with occasional min A over 2 sessions    Baseline 10-06-20    Period Weeks   or 9 total visits for all STGs   Status Partially Met      SLP SHORT TERM GOAL #2   Title Pt will demonstrate speech compensations for 100% intelligibility in 10 simple conversation with rare min A over 2 sessions    Baseline 10-06-20    Status  Partially Met      SLP SHORT TERM GOAL #3   Title Pt will verbalize and demonstrate recommended swallow strategies in therapy and at home with min A over 2 sessions    Baseline 09-29-20, 10-08-20    Status Achieved      SLP SHORT TERM GOAL #4   Title Pt will be able to verbalize three non-physical deficits by giving examples from home or within ST session in 2 sessions    Baseline 09-29-20    Status Partially Met              SLP Long Term Goals - 12/21/20 1245       SLP LONG TERM GOAL #1   Title Pt will verbalize functional application of 3 memory/attention strategies for home/work environments with rare min A over 2 sessions    Baseline 10-22-20, 12-03-20    Period --   or 25 total visits for all LTGs   Status Achieved      SLP LONG TERM GOAL #2   Title Pt will demonstrate speech compensations for 100% intelligibility in 30 mod complex conversation with rare min A over 3 sessions    Baseline 12-15-20, 12-21-20    Time 4    Period Weeks    Status On-going      SLP LONG TERM GOAL #3   Title Pt and wife will report consistent carryover of recommended swallow strategies and reduced s/sx of aspiration over 3 sessions    Baseline 10-15-20, 11-03-20, 11-05-20    Status Achieved      SLP LONG TERM GOAL #4   Title Pt will demonstrate improved insight and awareness of current deficits and impact on his performance with both work and home environments with rare min A over 2 sessions    Baseline 10-20-20, 11-03-20    Status Achieved      SLP LONG TERM GOAL #5   Title Pt will verbalize how to organize/plan/sequence day to day home/work tasks for increased functional independence with occasional min A over 2 sessions    Baseline 11-26-20, 12-03-20    Status Achieved      SLP LONG TERM GOAL #6   Title Pt will utilize word finding compensations when anomia/dysnomia occurs for 4/5 opportunities with rare min A over 2 sessions    Time 4    Period Weeks    Status On-going      SLP LONG TERM GOAL #7    Title Pt will verbalize awareness of when/why swallow difficulty occurs and demonstrate how to implement swallow strategy to reduce occurance for 4/5 opportunities over 2 sessions    Baseline 12-21-20    Time 4    Period Weeks    Status On-going  Plan - 12/21/20 1247     Clinical Impression Statement Gustav is seen for ST intervention to address improving 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 SLP targeted word finding skills, in which pt benefited from occasional to usual min questioning cues to ID additional items/information for planning a vacation. See "skilled treatment" for additional details of today's session. Pt would continue to benefit from skilled ST intervention to address cognitive communication, aphasia, dysarthria, and monitor dysphagia to aid patient in return to PLOF and work, reduce communication breakdown, and decrease aspiration risk.    Speech Therapy Frequency 1x /week    Duration Other (comment)   for 6 weeks or 6 more visits   Treatment/Interventions Compensatory strategies;Patient/family education;Functional tasks;Cueing hierarchy;Multimodal communcation approach;Cognitive reorganization;Environmental controls;Diet toleration management by SLP;Aspiration precaution training;Compensatory techniques;Internal/external aids;SLP instruction and feedback    Potential to Achieve Goals Fair    Potential Considerations Previous level of function    SLP Home Exercise Plan provided    Consulted and Agree with Plan of Care Patient             Patient will benefit from skilled therapeutic intervention in order to improve the following deficits and impairments:   Aphasia  Dysarthria and anarthria  Cognitive communication deficit    Problem List Patient Active Problem List   Diagnosis Date Noted   Orthostatic syncope 08/06/2020   CVA (cerebral vascular accident)  ([facility]) 07/21/2020   Acute CVA (cerebrovascular accident) ([facility]) 07/20/2020   Glaucoma    Hypertension    S/P CABG x 4    Coronary artery disease 07/08/2020   NSTEMI (non-ST elevated myocardial infarction) ([facility]) 07/02/2020   Hypertensive emergency 07/01/2020    Alinda Deem, MA CCC-SLP 12/21/2020, 2:18 PM  [facility] [address] [place], [place], [address] Phone: [phone]   Fax:  [phone]   Name: CLEAVE TERNES MRN: [id number] Date of Birth: 02/02/1966

## 2020-12-24 LAB — BASIC METABOLIC PANEL
BUN/Creatinine Ratio: 16 (ref 9–20)
BUN: 16 mg/dL (ref 6–24)
CO2: 23 mmol/L (ref 20–29)
Calcium: 9.1 mg/dL (ref 8.7–10.2)
Chloride: 105 mmol/L (ref 96–106)
Creatinine, Ser: 1.01 mg/dL (ref 0.76–1.27)
Glucose: 91 mg/dL (ref 65–99)
Potassium: 4.5 mmol/L (ref 3.5–5.2)
Sodium: 141 mmol/L (ref 134–144)
eGFR: 89 mL/min/{1.73_m2} (ref >59)

## 2020-12-28 ENCOUNTER — Encounter: Payer: Self-pay | Admitting: Occupational Therapy

## 2020-12-28 ENCOUNTER — Other Ambulatory Visit: Payer: Self-pay

## 2020-12-28 ENCOUNTER — Telehealth: Payer: Self-pay | Admitting: *Deleted

## 2020-12-28 ENCOUNTER — Ambulatory Visit: Payer: BC Managed Care – PPO

## 2020-12-28 ENCOUNTER — Ambulatory Visit: Payer: BC Managed Care – PPO | Attending: Adult Health | Admitting: Occupational Therapy

## 2020-12-28 DIAGNOSIS — R1310 Dysphagia, unspecified: Secondary | ICD-10-CM | POA: Diagnosis present

## 2020-12-28 DIAGNOSIS — R471 Dysarthria and anarthria: Secondary | ICD-10-CM | POA: Diagnosis present

## 2020-12-28 DIAGNOSIS — R4701 Aphasia: Secondary | ICD-10-CM

## 2020-12-28 DIAGNOSIS — R41840 Attention and concentration deficit: Secondary | ICD-10-CM | POA: Diagnosis present

## 2020-12-28 DIAGNOSIS — M25512 Pain in left shoulder: Secondary | ICD-10-CM | POA: Insufficient documentation

## 2020-12-28 DIAGNOSIS — R41841 Cognitive communication deficit: Secondary | ICD-10-CM | POA: Diagnosis present

## 2020-12-28 DIAGNOSIS — R2681 Unsteadiness on feet: Secondary | ICD-10-CM | POA: Diagnosis present

## 2020-12-28 DIAGNOSIS — M6281 Muscle weakness (generalized): Secondary | ICD-10-CM | POA: Diagnosis present

## 2020-12-28 DIAGNOSIS — I69354 Hemiplegia and hemiparesis following cerebral infarction affecting left non-dominant side: Secondary | ICD-10-CM | POA: Diagnosis present

## 2020-12-28 NOTE — Telephone Encounter (Signed)
Form completed, placed in medical records.

## 2020-12-28 NOTE — Telephone Encounter (Signed)
Signed and placed in outbox

## 2020-12-28 NOTE — Therapy (Signed)
[facility] [address] [place], [place], [address] Phone: [phone]   Fax:  [phone]  Occupational Therapy Treatment  Patient Details  Name: Leonard Donovan MRN: [id number] Date of Birth: 1966/04/26 Referring Provider (OT): Ihor Austin   Encounter Date: 12/28/2020   OT End of Session - 12/28/20 1320     Visit Number 19    Number of Visits 24    Date for OT Re-Evaluation 01/30/21    Authorization Type BCBS:  VL:MN    OT Start Time 1215    OT Stop Time 1300    OT Time Calculation (min) 45 min    Equipment Utilized During Treatment water dumbbells    Activity Tolerance Patient tolerated treatment well    Behavior During Therapy WFL for tasks assessed/performed             Past Medical History:  Diagnosis Date   CAD (coronary artery disease) of bypass graft    CABG x 4   Glaucoma    H/O: CVA (cerebrovascular accident) 2022   Hyperlipidemia LDL goal <70    Hypertension     Past Surgical History:  Procedure Laterality Date   CORONARY ARTERY BYPASS GRAFT N/A 07/08/2020   Procedure: CORONARY ARTERY BYPASS GRAFTING (CABG), ON PUMP, TIMES FOUR, LEFT INTERNAL MAMMARY ARTERY TO LAD, RIGHT SVG TO PDA, DISTAL CIRCUMFLEX, AND OM1;  Surgeon: Delight Ovens, MD;  Location: [facility] OR;  Service: Open Heart Surgery;  Laterality: N/A;   ENDOVEIN HARVEST OF GREATER SAPHENOUS VEIN Right 07/08/2020   Procedure: ENDOVEIN HARVEST OF RIGHT GREATER SAPHENOUS VEIN;  Surgeon: Delight Ovens, MD;  Location: [facility] OR;  Service: Open Heart Surgery;  Laterality: Right;   LEFT HEART CATH AND CORONARY ANGIOGRAPHY N/A 07/02/2020   Procedure: LEFT HEART CATH AND CORONARY ANGIOGRAPHY;  Surgeon: Corky Crafts, MD;  Location: [facility] INVASIVE CV LAB;  Service: Cardiovascular;  Laterality: N/A;   NO PAST SURGERIES     TEE WITHOUT CARDIOVERSION N/A 07/08/2020   Procedure: TRANSESOPHAGEAL ECHOCARDIOGRAM (TEE);  Surgeon: Delight Ovens, MD;   Location: [facility] OR;  Service: Open Heart Surgery;  Laterality: N/A;    There were no vitals filed for this visit.   Subjective Assessment - 12/28/20 1320     Subjective  It is better - just a "1" mild ache in anterior proximal humerus - increased pain with IR/ER    Currently in Pain? Yes    Pain Score 1              Patient seen for aquatic therapy visit.  Patient entered and exited pool via stairs with supervision assist.   Session occurred in 3.5-4.5 ft of water.   Worked initially on buoyancy supported floating versus lifting of BUE.  Transitioned to resistive training and balance with water walking with mild to moderate turbulence in 4.5 ft of water.  Chest height.   Double dumbbells to address elbow extension, flexion, shoulder flexion, extension, abduction, IR/ERand overall postural control / balanced core activation.   Used overhead ladder to hang to stretch and strengthen left shoulder.                         OT Short Term Goals - 12/28/20 1321       OT SHORT TERM GOAL #1   Title Patient will complete an HEP designed to improve grip strength in left hand    Time 4    Period  Weeks    Status Achieved      OT SHORT TERM GOAL #2   Title Patient will complete an HEP designed to improve range of motion and decrease pain in left shoulder    Time 4    Period Weeks    Status Achieved      OT SHORT TERM GOAL #3   Title Patient will report greater ease in  washing lower legs and donning socks.    Time 4    Period Weeks    Status Achieved   pt reports greater ease. 09/22/20     OT SHORT TERM GOAL #4   Title Patient will report improved ability to get onto and off of lawn mower (management of left leg)    Time 4    Period Weeks    Status On-going               OT Long Term Goals - 12/28/20 1322       OT LONG TERM GOAL #1   Title Patient will complete an updated HEP to address LUE functioning    Time 8    Period Weeks    Status On-going       OT LONG TERM GOAL #2   Title Patient will demonstrate 5 lb increase in left grip strength to improve functional grasp    Time 4    Period Weeks    Status Achieved   40lbs with LUE 10/06/20     OT LONG TERM GOAL #3   Title Patient will reach overhead to obtain and place an object no heavier than 2 lbs x 3 without increased pain.    Time 8    Period Weeks    Status On-going      OT LONG TERM GOAL #4   Title Patient and wife will demonstrate understanding of return to driving recommendations    Time 8    Period Weeks    Status On-going      OT LONG TERM GOAL #5   Title Patient and wife will demonstrate understanding of return to work recommendations    Time 8    Period Weeks    Status On-going                   Plan - 12/28/20 1321     Clinical Impression Statement Patient reports shoulder is getting better.  Still limited and painful at end range.  Mostly limited with internal rotation with extension and elbow flexion    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 ADL;Coordination;Endurance;GMC;Muscle spasms;UE functional use;Vestibular;Decreased knowledge of precautions;Balance;Body mechanics;Decreased knowledge of use of DME;Flexibility;IADL;Pain;Strength;FMC;Dexterity;Cardiopulmonary status limiting activity;Mobility;ROM;Tone    Cognitive Skills Attention;Emotional;Energy/Drive;Learn;Memory;Temperament/Personality;Sequencing;Safety Awareness;Problem Solve;Perception    Rehab Potential Good    Clinical Decision Making Several treatment options, min-mod task modification necessary    Comorbidities Affecting Occupational Performance: May have comorbidities impacting occupational  performance    Modification or Assistance to Complete Evaluation  Min-Moderate modification of tasks or assist with assess necessary to complete eval    OT Frequency 1x / week    OT Duration 8 weeks    OT Treatment/Interventions Self-care/ADL training;Electrical Stimulation;Therapeutic exercise;Patient/family education;Splinting;Neuromuscular education;Paraffin;Moist Heat;Aquatic Therapy;Fluidtherapy;Energy conservation;Building services engineer;Therapeutic activities;Balance training;Cryotherapy;Ultrasound;Contrast Bath;DME and/or AE instruction;Manual Therapy;Passive range  of motion;Cognitive remediation/compensation    Plan continue addressing shoulder pain, pool for shoulder - range of motion ad strengthening    Consulted and Agree with Plan of Care Patient             Patient will benefit from skilled therapeutic intervention in order to improve the following deficits and impairments:   Body Structure / Function / Physical Skills: ADL, Coordination, Endurance, GMC, Muscle spasms, UE functional use, Vestibular, Decreased knowledge of precautions, Balance, Body mechanics, Decreased knowledge of use of DME, Flexibility, IADL, Pain, Strength, FMC, Dexterity, Cardiopulmonary status limiting activity, Mobility, ROM, Tone Cognitive Skills: Attention, Emotional, Energy/Drive, Learn, Memory, Temperament/Personality, Sequencing, Safety Awareness, Problem Solve, Perception     Visit Diagnosis: Hemiplegia and hemiparesis following cerebral infarction affecting left non-dominant side (HCC)  Acute pain of left shoulder  Muscle weakness (generalized)  Unsteadiness on feet    Problem List Patient Active Problem List   Diagnosis Date Noted   Orthostatic syncope 08/06/2020   CVA (cerebral vascular accident) (HCC) 07/21/2020   Acute CVA (cerebrovascular accident) (HCC) 07/20/2020   Glaucoma    Hypertension    S/P CABG x 4    Coronary artery disease 07/08/2020   NSTEMI (non-ST elevated  myocardial infarction) (HCC) 07/02/2020   Hypertensive emergency 07/01/2020    Collier Salina 12/28/2020, Arabella Merles PM  [facility] [address] [place], [place], [address] Phone: [phone]   Fax:  [phone]  Name: Leonard Donovan MRN: [id number] Date of Birth: 21-Oct-1965

## 2020-12-28 NOTE — Telephone Encounter (Signed)
Pt Short Term Disability form on nurse pod.

## 2020-12-28 NOTE — Telephone Encounter (Signed)
Form filled out, placed on jessica mccue desk for review and signature.

## 2020-12-28 NOTE — Patient Instructions (Signed)
How is swallowing? Word finding? Speech clarity? Attention?      Think about putting yourself into new situations to test your communication skills.   Do something every day to work your brain and thinking skills. Example: learn something new, build something, read some articles, [employer] memberships, etc.

## 2020-12-29 ENCOUNTER — Telehealth: Payer: Self-pay | Admitting: *Deleted

## 2020-12-29 NOTE — Therapy (Signed)
[facility] [address] [place], [place], [address] Phone: [phone]   Fax:  [phone]  Speech Language Pathology Treatment  Patient Details  Name: Leonard Donovan MRN: [id number] Date of Birth: Sep 30, 1965 Referring Provider (SLP): Frann Rider NP   Encounter Date: 12/28/2020   End of Session - 12/29/20 0958     Visit Number 28    Number of Visits 30    Date for SLP Re-Evaluation 01/14/21    Authorization Type BCBS    SLP Start Time 8921    SLP Stop Time  1941    SLP Time Calculation (min) 45 min    Activity Tolerance Patient tolerated treatment well             Past Medical History:  Diagnosis Date   CAD (coronary artery disease) of bypass graft    CABG x 4   Glaucoma    H/O: CVA (cerebrovascular accident) 2022   Hyperlipidemia LDL goal <70    Hypertension     Past Surgical History:  Procedure Laterality Date   CORONARY ARTERY BYPASS GRAFT N/A 07/08/2020   Procedure: CORONARY ARTERY BYPASS GRAFTING (CABG), ON PUMP, TIMES FOUR, LEFT INTERNAL MAMMARY ARTERY TO LAD, RIGHT SVG TO PDA, DISTAL CIRCUMFLEX, AND OM1;  Surgeon: Grace Isaac, MD;  Location: [facility];  Service: Open Heart Surgery;  Laterality: N/A;   ENDOVEIN HARVEST OF GREATER SAPHENOUS VEIN Right 07/08/2020   Procedure: ENDOVEIN HARVEST OF RIGHT GREATER SAPHENOUS VEIN;  Surgeon: Grace Isaac, MD;  Location: [facility];  Service: Open Heart Surgery;  Laterality: Right;   LEFT HEART CATH AND CORONARY ANGIOGRAPHY N/A 07/02/2020   Procedure: LEFT HEART CATH AND CORONARY ANGIOGRAPHY;  Surgeon: Jettie Booze, MD;  Location: [facility] CV LAB;  Service: Cardiovascular;  Laterality: N/A;   NO PAST SURGERIES     TEE WITHOUT CARDIOVERSION N/A 07/08/2020   Procedure: TRANSESOPHAGEAL ECHOCARDIOGRAM (TEE);  Surgeon: Grace Isaac, MD;  Location: [facility];  Service: Open Heart Surgery;  Laterality: N/A;    There were no vitals filed for this  visit.   Subjective Assessment - 12/28/20 1531     Subjective "I'm tired"    Currently in Pain? Yes    Pain Score 2     Pain Location Shoulder                   ADULT SLP TREATMENT - 12/28/20 1531       General Information   Behavior/Cognition Alert;Cooperative;Pleasant mood;Distractible      Treatment Provided   Treatment provided Cognitive-Linquistic      Cognitive-Linquistic Treatment   Treatment focused on Aphasia;Cognition;Dysarthria;Patient/family/caregiver education    Skilled Treatment Pt realized he misnamed restaurant for beach trip planned. Pt completed 1/2 HWK with some clarification provided to increase comprehension. Min questioning cues required to name sequence for fall planting routine. SLP inquired about swallowing, word finding, speech intelligibility, and attention, in which pt did not identify any overt problems. SLP encouraged patient put self into new communication scenarios to assess communication as pt has some concerns for communication and returning to work. SLP targeted 1-minute speech topics, in which pt completed with moderate questioning cues to expand responses. No overt word finding exhibited on structured speech tasks, with 100% speech intelligibility exhibited. SLP recommended patient implement more challenging cognitive tasks into daily routine as limited activities indicated at home. SLP also questions motivation as pt has not completed various functional tasks to engage cognitive communication  skills at home per SLP recommendation.      Assessment / Recommendations / Plan   Plan Continue with current plan of care      Progression Toward Goals   Progression toward goals Progressing toward goals              SLP Education - 12/29/20 0958     Education Details self-monitoring, functional cognitive communication activities to engage self at home    Person(s) Educated Patient    Methods Explanation;Demonstration;Handout     Comprehension Verbalized understanding;Returned demonstration;Need further instruction              SLP Short Term Goals - 10/13/20 1722       SLP SHORT TERM GOAL #1   Title Pt will verbalize functional application of 2 memory/attention strategies for home/work environments with occasional min A over 2 sessions    Baseline 10-06-20    Period Weeks   or 9 total visits for all STGs   Status Partially Met      SLP SHORT TERM GOAL #2   Title Pt will demonstrate speech compensations for 100% intelligibility in 10 simple conversation with rare min A over 2 sessions    Baseline 10-06-20    Status Partially Met      SLP SHORT TERM GOAL #3   Title Pt will verbalize and demonstrate recommended swallow strategies in therapy and at home with min A over 2 sessions    Baseline 09-29-20, 10-08-20    Status Achieved      SLP SHORT TERM GOAL #4   Title Pt will be able to verbalize three non-physical deficits by giving examples from home or within ST session in 2 sessions    Baseline 09-29-20    Status Partially Met              SLP Long Term Goals - 12/29/20 0959       SLP LONG TERM GOAL #1   Title Pt will verbalize functional application of 3 memory/attention strategies for home/work environments with rare min A over 2 sessions    Baseline 10-22-20, 12-03-20    Period --   or 25 total visits for all LTGs   Status Achieved      SLP LONG TERM GOAL #2   Title Pt will demonstrate speech compensations for 100% intelligibility in 30 mod complex conversation with rare min A over 3 sessions    Baseline 12-15-20, 12-21-20, 12-29-20    Status Achieved      SLP LONG TERM GOAL #3   Title Pt and wife will report consistent carryover of recommended swallow strategies and reduced s/sx of aspiration over 3 sessions    Baseline 10-15-20, 11-03-20, 11-05-20    Status Achieved      SLP LONG TERM GOAL #4   Title Pt will demonstrate improved insight and awareness of current deficits and impact on his performance  with both work and home environments with rare min A over 2 sessions    Baseline 10-20-20, 11-03-20    Status Achieved      SLP LONG TERM GOAL #5   Title Pt will verbalize how to organize/plan/sequence day to day home/work tasks for increased functional independence with occasional min A over 2 sessions    Baseline 11-26-20, 12-03-20    Status Achieved      SLP LONG TERM GOAL #6   Title Pt will utilize word finding compensations when anomia/dysnomia occurs for 4/5 opportunities with rare min A over 2  sessions    Time 3    Period Weeks    Status On-going      SLP LONG TERM GOAL #7   Title Pt will verbalize awareness of when/why swallow difficulty occurs and demonstrate how to implement swallow strategy to reduce occurance for 4/5 opportunities over 2 sessions    Baseline 12-21-20    Time 3    Period Weeks    Status On-going              Plan - 12/29/20 0958     Clinical Impression Statement Moussa is seen for ST intervention to address improving 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 SLP targeted word finding skills, in which pt benefited from occasional to usual min questioning cues to expand responses. No overt word finding exhibited and pt was 100% intelligibile. SLP provided functional activities to initiate at home and in community to engage cognitive communication skills. See "skilled treatment" for additional details of today's session. Pt would continue to benefit from skilled ST intervention to address cognitive communication, aphasia, dysarthria, and monitor dysphagia to aid patient in return to PLOF and work, reduce communication breakdown, and decrease aspiration risk.    Speech Therapy Frequency 1x /week    Duration Other (comment)   for 6 weeks or 6 more visits   Treatment/Interventions Compensatory strategies;Patient/family education;Functional tasks;Cueing hierarchy;Multimodal  communcation approach;Cognitive reorganization;Environmental controls;Diet toleration management by SLP;Aspiration precaution training;Compensatory techniques;Internal/external aids;SLP instruction and feedback    Potential to Achieve Goals Fair    Potential Considerations Previous level of function;Cooperation/participation level    SLP Home Exercise Plan provided    Consulted and Agree with Plan of Care Patient             Patient will benefit from skilled therapeutic intervention in order to improve the following deficits and impairments:   Aphasia  Dysarthria and anarthria  Cognitive communication deficit    Problem List Patient Active Problem List   Diagnosis Date Noted   Orthostatic syncope 08/06/2020   CVA (cerebral vascular accident) ([place]) 07/21/2020   Acute CVA (cerebrovascular accident) ([facility]) 07/20/2020   Glaucoma    Hypertension    S/P CABG x 4    Coronary artery disease 07/08/2020   NSTEMI (non-ST elevated myocardial infarction) ([place]) 07/02/2020   Hypertensive emergency 07/01/2020    Alinda Deem, [place] CCC-SLP 12/29/2020, 10:00 AM  [facility] [address] [place], [place], [address] Phone: [phone]   Fax:  [phone]   Name: COLIE FUGITT MRN: [id number] Date of Birth: 1965/10/11

## 2020-12-29 NOTE — Telephone Encounter (Signed)
Pt short term disability form @ front desk for p/u.  

## 2021-01-04 ENCOUNTER — Ambulatory Visit: Payer: BC Managed Care – PPO | Admitting: Occupational Therapy

## 2021-01-04 ENCOUNTER — Ambulatory Visit: Payer: BC Managed Care – PPO

## 2021-01-04 ENCOUNTER — Other Ambulatory Visit: Payer: Self-pay

## 2021-01-04 ENCOUNTER — Encounter: Payer: Self-pay | Admitting: Occupational Therapy

## 2021-01-04 DIAGNOSIS — R4701 Aphasia: Secondary | ICD-10-CM

## 2021-01-04 DIAGNOSIS — I69354 Hemiplegia and hemiparesis following cerebral infarction affecting left non-dominant side: Secondary | ICD-10-CM | POA: Diagnosis not present

## 2021-01-04 DIAGNOSIS — R471 Dysarthria and anarthria: Secondary | ICD-10-CM

## 2021-01-04 DIAGNOSIS — R41841 Cognitive communication deficit: Secondary | ICD-10-CM

## 2021-01-04 NOTE — Therapy (Signed)
[facility] [address] [place], [place], [address] Phone: [phone]   Fax:  [phone]  Speech Language Pathology Treatment  Patient Details  Name: Leonard Donovan MRN: [id number] Date of Birth: 1966/03/05 Referring Provider (SLP): Frann Rider NP   Encounter Date: 01/04/2021   End of Session - 01/04/21 1408     Visit Number 29    Number of Visits 30    Date for SLP Re-Evaluation 01/14/21    Authorization Type BCBS    SLP Start Time 1400    SLP Stop Time  4765    SLP Time Calculation (min) 45 min    Activity Tolerance Patient tolerated treatment well             Past Medical History:  Diagnosis Date   CAD (coronary artery disease) of bypass graft    CABG x 4   Glaucoma    H/O: CVA (cerebrovascular accident) 2022   Hyperlipidemia LDL goal <70    Hypertension     Past Surgical History:  Procedure Laterality Date   CORONARY ARTERY BYPASS GRAFT N/A 07/08/2020   Procedure: CORONARY ARTERY BYPASS GRAFTING (CABG), ON PUMP, TIMES FOUR, LEFT INTERNAL MAMMARY ARTERY TO LAD, RIGHT SVG TO PDA, DISTAL CIRCUMFLEX, AND OM1;  Surgeon: Grace Isaac, MD;  Location: [facility];  Service: Open Heart Surgery;  Laterality: N/A;   ENDOVEIN HARVEST OF GREATER SAPHENOUS VEIN Right 07/08/2020   Procedure: ENDOVEIN HARVEST OF RIGHT GREATER SAPHENOUS VEIN;  Surgeon: Grace Isaac, MD;  Location: [facility];  Service: Open Heart Surgery;  Laterality: Right;   LEFT HEART CATH AND CORONARY ANGIOGRAPHY N/A 07/02/2020   Procedure: LEFT HEART CATH AND CORONARY ANGIOGRAPHY;  Surgeon: Jettie Booze, MD;  Location: [facility];  Service: Cardiovascular;  Laterality: N/A;   NO PAST SURGERIES     TEE WITHOUT CARDIOVERSION N/A 07/08/2020   Procedure: TRANSESOPHAGEAL ECHOCARDIOGRAM (TEE);  Surgeon: Grace Isaac, MD;  Location: [facility];  Service: Open Heart Surgery;  Laterality: N/A;    There were no vitals filed for this  visit.   Subjective Assessment - 01/04/21 1401     Subjective "just got out of the pool"    Currently in Pain? Yes    Pain Score 1     Pain Location Shoulder    Pain Orientation Left                   ADULT SLP TREATMENT - 01/04/21 1400       General Information   Behavior/Cognition Alert;Cooperative;Pleasant mood;Distractible      Treatment Provided   Treatment provided Cognitive-Linquistic      Cognitive-Linquistic Treatment   Treatment focused on Aphasia;Cognition;Dysarthria;Patient/family/caregiver education    Skilled Treatment Pt reports he did not complete any HWK to engage self in new communication or cognitive scenarios to target functional word finding, speech intelligiblity, and attention. Rare word finding episode x1 reported over last week. No coughing or choking reported for swallowing. SLP targeted sentence completions, in which pt completed with 95% accuracy given extended time x8. SLP targeted generation of sentence using two targeted words, in which pt completed with rare min A given usual extended time.      Assessment / Recommendations / Plan   Plan Continue with current plan of care      Progression Toward Goals   Progression toward goals Progressing toward goals  SLP Education - 01/04/21 1451     Education Details functional practice, HEP    Person(s) Educated Patient    Methods Explanation;Demonstration;Handout    Comprehension Verbalized understanding;Returned demonstration              SLP Short Term Goals - 10/13/20 1722       SLP SHORT TERM GOAL #1   Title Pt will verbalize functional application of 2 memory/attention strategies for home/work environments with occasional min A over 2 sessions    Baseline 10-06-20    Period Weeks   or 9 total visits for all STGs   Status Partially Met      SLP SHORT TERM GOAL #2   Title Pt will demonstrate speech compensations for 100% intelligibility in 10 simple conversation  with rare min A over 2 sessions    Baseline 10-06-20    Status Partially Met      SLP SHORT TERM GOAL #3   Title Pt will verbalize and demonstrate recommended swallow strategies in therapy and at home with min A over 2 sessions    Baseline 09-29-20, 10-08-20    Status Achieved      SLP SHORT TERM GOAL #4   Title Pt will be able to verbalize three non-physical deficits by giving examples from home or within ST session in 2 sessions    Baseline 09-29-20    Status Partially Met              SLP Long Term Goals - 01/04/21 1408       SLP LONG TERM GOAL #1   Title Pt will verbalize functional application of 3 memory/attention strategies for home/work environments with rare min A over 2 sessions    Baseline 10-22-20, 12-03-20    Period --   or 25 total visits for all LTGs   Status Achieved      SLP LONG TERM GOAL #2   Title Pt will demonstrate speech compensations for 100% intelligibility in 30 mod complex conversation with rare min A over 3 sessions    Baseline 12-15-20, 12-21-20, 12-29-20    Status Achieved      SLP LONG TERM GOAL #3   Title Pt and wife will report consistent carryover of recommended swallow strategies and reduced s/sx of aspiration over 3 sessions    Baseline 10-15-20, 11-03-20, 11-05-20    Status Achieved      SLP LONG TERM GOAL #4   Title Pt will demonstrate improved insight and awareness of current deficits and impact on his performance with both work and home environments with rare min A over 2 sessions    Baseline 10-20-20, 11-03-20    Status Achieved      SLP LONG TERM GOAL #5   Title Pt will verbalize how to organize/plan/sequence day to day home/work tasks for increased functional independence with occasional min A over 2 sessions    Baseline 11-26-20, 12-03-20    Status Achieved      SLP LONG TERM GOAL #6   Title Pt will utilize word finding compensations when anomia/dysnomia occurs for 4/5 opportunities with rare min A over 2 sessions    Baseline 01-04-21    Time 2     Period Weeks    Status On-going      SLP LONG TERM GOAL #7   Title Pt will verbalize awareness of when/why swallow difficulty occurs and demonstrate how to implement swallow strategy to reduce occurance for 4/5 opportunities over 2 sessions    Baseline  12-21-20    Time 2    Period Weeks    Status On-going              Plan - 01/04/21 1451     Clinical Impression Statement Rhonin is seen for ST intervention to address improving mild to mod cognitive communication impairment, mild aphasia and dysarthria, and seemingly resolving (possibly cognitive based) dysphagia s/p CVA in February 2021. SLP targeted word finding skills, in which pt benefited from occasional to usual extended time. Rare dysnomia exhibited x1 and rare anomia x2 exhibited, in which pt benefited from rare min A. SLP again recommended functional activities to initiate at home and in community to engage cognitive communication skills. See "skilled treatment" for additional details of today's session. Pt would continue to benefit from skilled ST intervention to address cognitive communication, aphasia, dysarthria, and monitor dysphagia to aid patient in return to PLOF and work, reduce communication breakdown, and decrease aspiration risk.    Speech Therapy Frequency 1x /week    Duration Other (comment)   for 6 weeks or 6 more visits   Treatment/Interventions Compensatory strategies;Patient/family education;Functional tasks;Cueing hierarchy;Multimodal communcation approach;Cognitive reorganization;Environmental controls;Diet toleration management by SLP;Aspiration precaution training;Compensatory techniques;Internal/external aids;SLP instruction and feedback    Potential to Achieve Goals Fair    Potential Considerations Previous level of function;Cooperation/participation level    SLP Home Exercise Plan provided    Consulted and Agree with Plan of Care Patient             Patient will benefit from skilled therapeutic  intervention in order to improve the following deficits and impairments:   Aphasia  Dysarthria and anarthria  Cognitive communication deficit    Problem List Patient Active Problem List   Diagnosis Date Noted   Orthostatic syncope 08/06/2020   CVA (cerebral vascular accident) ([facility]) 07/21/2020   Acute CVA (cerebrovascular accident) ([facility]) 07/20/2020   Glaucoma    Hypertension    S/P CABG x 4    Coronary artery disease 07/08/2020   NSTEMI (non-ST elevated myocardial infarction) ([facility]) 07/02/2020   Hypertensive emergency 07/01/2020    Alinda Deem, MA CCC-SLP 01/04/2021, 2:53 PM  [facility] [address] [place], [place], [address] Phone: [phone]   Fax:  [phone]   Name: BENJI POYNTER MRN: [id number] Date of Birth: May 01, 1966

## 2021-01-04 NOTE — Therapy (Signed)
[facility] [address] [place], [place], [address] Phone: [phone]   Fax:  [phone]  Occupational Therapy Treatment and Discharge Summary  Patient Details  Name: Leonard Donovan MRN: [id number] Date of Birth: 26-Mar-1966 Referring Provider (OT): Frann Rider   Encounter Date: 01/04/2021   OT End of Session - 01/04/21 1308     Visit Number 20    Number of Visits 24    Date for OT Re-Evaluation 01/30/21    Authorization Type BCBS:  [id number]    OT Start Time 1215    OT Stop Time 90    OT Time Calculation (min) 45 min    Equipment Utilized During Treatment water dumbbells, floatation equipment    Activity Tolerance Patient tolerated treatment well    Behavior During Therapy WFL for tasks assessed/performed             Past Medical History:  Diagnosis Date   CAD (coronary artery disease) of bypass graft    CABG x 4   Glaucoma    H/O: CVA (cerebrovascular accident) 2022   Hyperlipidemia LDL goal <70    Hypertension     Past Surgical History:  Procedure Laterality Date   CORONARY ARTERY BYPASS GRAFT N/A 07/08/2020   Procedure: CORONARY ARTERY BYPASS GRAFTING (CABG), ON PUMP, TIMES FOUR, LEFT INTERNAL MAMMARY ARTERY TO LAD, RIGHT SVG TO PDA, DISTAL CIRCUMFLEX, AND OM1;  Surgeon: Grace Isaac, MD;  Location: [facility];  Service: Open Heart Surgery;  Laterality: N/A;   ENDOVEIN HARVEST OF GREATER SAPHENOUS VEIN Right 07/08/2020   Procedure: ENDOVEIN HARVEST OF RIGHT GREATER SAPHENOUS VEIN;  Surgeon: Grace Isaac, MD;  Location: [facility];  Service: Open Heart Surgery;  Laterality: Right;   LEFT HEART CATH AND CORONARY ANGIOGRAPHY N/A 07/02/2020   Procedure: LEFT HEART CATH AND CORONARY ANGIOGRAPHY;  Surgeon: Jettie Booze, MD;  Location: [facility];  Service: Cardiovascular;  Laterality: N/A;   NO PAST SURGERIES     TEE WITHOUT CARDIOVERSION N/A 07/08/2020   Procedure: TRANSESOPHAGEAL  ECHOCARDIOGRAM (TEE);  Surgeon: Grace Isaac, MD;  Location: [facility];  Service: Open Heart Surgery;  Laterality: N/A;    There were no vitals filed for this visit.   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    Currently in Pain? Yes    Pain Score 1     Pain Location Shoulder    Pain Orientation Left    Pain Descriptors / Indicators Aching    Pain Type Chronic pain    Pain Onset More than a month ago    Pain Frequency Intermittent    Aggravating Factors  shoulder extension, internal rotation, end range flexion/abd/horiz adduction    Pain Relieving Factors heat, pool therapy    Multiple Pain Sites No              Patient seen for aquatic therapy visit.  Reports two days of soreness following last session.  Patient entered and exited pool independently at stairs.  Therapy took place in 4 ft of water with emphasis on buoyancy assisted movement and resisted movement in pain free ranges with use of water dumbbells.  Worked to mobilize rib cage through various rotational movements in supported supine.   Discussed returning to driving at this time - just driving trials in empty parking lot with another adult licensed driver to  ascertain ability to accelerate, brake, turn using two hands, use left handed controls.  Patient is not yet ready to drive on the road driving per his report.  Discussed concern relating to reaction time which may limit his ability to retunr to on the road driving.  Patient has mowed the lawn, and has backed up his truck in the driveway - but that is the extent of his driving.                      OT Education - 01/04/21 1307     Education Details revisitied graduated driving - operating vehicle with another licensed driver in an empty parking lot.  Concern regarding reaction time    Person(s) Educated Patient    Methods Explanation    Comprehension  Need further instruction              OT Short Term Goals - 01/04/21 1311       OT SHORT TERM GOAL #1   Title Patient will complete an HEP designed to improve grip strength in left hand    Time 4    Period Weeks    Status Achieved      OT SHORT TERM GOAL #2   Title Patient will complete an HEP designed to improve range of motion and decrease pain in left shoulder    Time 4    Period Weeks    Status Achieved      OT SHORT TERM GOAL #3   Title Patient will report greater ease in  washing lower legs and donning socks.    Time 4    Period Weeks    Status Achieved   pt reports greater ease. 09/22/20     OT SHORT TERM GOAL #4   Title Patient will report improved ability to get onto and off of lawn mower (management of left leg)    Time 4    Period Weeks    Status Achieved               OT Long Term Goals - 01/04/21 1311       OT LONG TERM GOAL #1   Title Patient will complete an updated HEP to address LUE functioning    Time 8    Period Weeks    Status Achieved      OT LONG TERM GOAL #2   Title Patient will demonstrate 5 lb increase in left grip strength to improve functional grasp    Time 4    Period Weeks    Status Achieved   40lbs with LUE 10/06/20     OT LONG TERM GOAL #3   Title Patient will reach overhead to obtain and place an object no heavier than 2 lbs x 3 without increased pain.    Time 8    Period Weeks    Status Not Met      OT LONG TERM GOAL #4   Title Patient and wife will demonstrate understanding of return to driving recommendations    Time 8    Period Weeks    Status Partially Met   recommend only driving with another driver in empty parking lot at this time     OT [place] #5   Title Patient and wife will demonstrate understanding of return to work recommendations    Time 8    Period Weeks    Status Partially Met   Patient is not yet ready for return  to work                  Plan - 01/04/21 1308     Clinical  Impression Statement Patient reports shoulder is getting better.  Patient agreeablke to discharge as this was last scheduled visit.  Patient reports he is scheduled to start therapy in [place] soon.    OT Occupational Profile and History Detailed Assessment- Review of Records and additional review of physical, cognitive, psychosocial history related to current functional performance    Occupational performance deficits (Please refer to evaluation for details): ADL's;IADL's;Rest and Sleep;Work    Marketing executive / Function / Physical Skills ADL;Coordination;Endurance;GMC;Muscle spasms;UE functional use;Vestibular;Decreased knowledge of precautions;Balance;Body mechanics;Decreased knowledge of use of DME;Flexibility;IADL;Pain;Strength;FMC;Dexterity;Cardiopulmonary status limiting activity;Mobility;ROM;Tone    Cognitive Skills Attention;Emotional;Energy/Drive;Learn;Memory;Temperament/Personality;Sequencing;Safety Awareness;Problem Solve;Perception    Rehab Potential Good    Clinical Decision Making Several treatment options, min-mod task modification necessary    Comorbidities Affecting Occupational Performance: May have comorbidities impacting occupational performance    Modification or Assistance to Complete Evaluation  Min-Moderate modification of tasks or assist with assess necessary to complete eval    OT Frequency 1x / week    OT Duration 8 weeks    OT Treatment/Interventions Self-care/ADL training;Electrical Stimulation;Therapeutic exercise;Patient/family education;Splinting;Neuromuscular education;Paraffin;Moist Heat;Aquatic Therapy;Fluidtherapy;Energy conservation;Therapist, nutritional;Therapeutic activities;Balance training;Cryotherapy;Ultrasound;Contrast Bath;DME and/or AE instruction;Manual Therapy;Passive range of motion;Cognitive remediation/compensation    Plan discharge    Consulted and Agree with Plan of Care Patient             Patient will benefit from skilled  therapeutic intervention in order to improve the following deficits and impairments:   Body Structure / Function / Physical Skills: ADL, Coordination, Endurance, GMC, Muscle spasms, UE functional use, Vestibular, Decreased knowledge of precautions, Balance, Body mechanics, Decreased knowledge of use of DME, Flexibility, IADL, Pain, Strength, FMC, Dexterity, Cardiopulmonary status limiting activity, Mobility, ROM, Tone Cognitive Skills: Attention, Emotional, Energy/Drive, Learn, Memory, Temperament/Personality, Sequencing, Safety Awareness, Problem Solve, Perception     Visit Diagnosis: Hemiplegia and hemiparesis following cerebral infarction affecting left non-dominant side (HCC)  Acute pain of left shoulder  Muscle weakness (generalized)  Unsteadiness on feet  Attention and concentration deficit    Problem List Patient Active Problem List   Diagnosis Date Noted   Orthostatic syncope 08/06/2020   CVA (cerebral vascular accident) ([facility]) 07/21/2020   Acute CVA (cerebrovascular accident) ([facility]) 07/20/2020   Glaucoma    Hypertension    S/P CABG x 4    Coronary artery disease 07/08/2020   NSTEMI (non-ST elevated myocardial infarction) ([facility]) 07/02/2020   Hypertensive emergency 07/01/2020   OCCUPATIONAL THERAPY DISCHARGE SUMMARY  Visits from Start of Care: 20  Current functional level related to goals / functional outcomes: Reduced shoulder pain, increased independence with ADL and functional mobility   Remaining deficits: Mild but persistent shoulder pain LUE, decreased initiation   Education / Equipment: HEP shoulder, hand   Patient agrees to discharge. Patient goals were partially met. Patient is being discharged due to being pleased with the current functional level.Mariah Milling, OTR/L 01/04/2021, 1:13 PM  [facility] [address] [place], [place], [address] Phone: [phone]   Fax:   [phone]  Name: MAURIE OLESEN MRN: [id number] Date of Birth: [birth year]

## 2021-01-05 ENCOUNTER — Ambulatory Visit: Payer: BC Managed Care – PPO

## 2021-01-12 ENCOUNTER — Other Ambulatory Visit: Payer: Self-pay

## 2021-01-12 ENCOUNTER — Ambulatory Visit: Payer: BC Managed Care – PPO

## 2021-01-12 DIAGNOSIS — R1310 Dysphagia, unspecified: Secondary | ICD-10-CM

## 2021-01-12 DIAGNOSIS — R471 Dysarthria and anarthria: Secondary | ICD-10-CM

## 2021-01-12 DIAGNOSIS — R41841 Cognitive communication deficit: Secondary | ICD-10-CM

## 2021-01-12 DIAGNOSIS — R4701 Aphasia: Secondary | ICD-10-CM

## 2021-01-12 DIAGNOSIS — I69354 Hemiplegia and hemiparesis following cerebral infarction affecting left non-dominant side: Secondary | ICD-10-CM | POA: Diagnosis not present

## 2021-01-12 NOTE — Therapy (Signed)
[facility] [address] [place], [place], [address] Phone: [phone]   Fax:  [phone]  Speech Language Pathology Treatment/Discharge Summary  Patient Details  Name: Leonard Donovan MRN: [id number] Date of Birth: 1966/01/23 Referring Provider (SLP): Frann Rider NP   Encounter Date: 01/12/2021   End of Session - 01/12/21 1322     Visit Number 30    Number of Visits 30    Date for SLP Re-Evaluation 01/14/21    Authorization Type BCBS    SLP Start Time 1319    SLP Stop Time  1400    SLP Time Calculation (min) 41 min    Activity Tolerance Patient tolerated treatment well             Past Medical History:  Diagnosis Date   CAD (coronary artery disease) of bypass graft    CABG x 4   Glaucoma    H/O: CVA (cerebrovascular accident) 2022   Hyperlipidemia LDL goal <70    Hypertension     Past Surgical History:  Procedure Laterality Date   CORONARY ARTERY BYPASS GRAFT N/A 07/08/2020   Procedure: CORONARY ARTERY BYPASS GRAFTING (CABG), ON PUMP, TIMES FOUR, LEFT INTERNAL MAMMARY ARTERY TO LAD, RIGHT SVG TO PDA, DISTAL CIRCUMFLEX, AND OM1;  Surgeon: Grace Isaac, MD;  Location: [facility];  Service: Open Heart Surgery;  Laterality: N/A;   ENDOVEIN HARVEST OF GREATER SAPHENOUS VEIN Right 07/08/2020   Procedure: ENDOVEIN HARVEST OF RIGHT GREATER SAPHENOUS VEIN;  Surgeon: Grace Isaac, MD;  Location: [facility];  Service: Open Heart Surgery;  Laterality: Right;   LEFT HEART CATH AND CORONARY ANGIOGRAPHY N/A 07/02/2020   Procedure: LEFT HEART CATH AND CORONARY ANGIOGRAPHY;  Surgeon: Jettie Booze, MD;  Location: [facility];  Service: Cardiovascular;  Laterality: N/A;   NO PAST SURGERIES     TEE WITHOUT CARDIOVERSION N/A 07/08/2020   Procedure: TRANSESOPHAGEAL ECHOCARDIOGRAM (TEE);  Surgeon: Grace Isaac, MD;  Location: [facility];  Service: Open Heart Surgery;  Laterality: N/A;    There were no vitals  filed for this visit.   SPEECH THERAPY DISCHARGE SUMMARY  Visits from Start of Care: 30  Current functional level related to goals / functional outcomes: Theoplis presents with improvements in aphasia, dysarthria, cognition, and dysphagia secondary to ST education and training, as pt has met all ST goals at this time. Rare word finding and rare reduced speech intelligibility exhibited in ST sessions and now rarely reported at home or in the community. Pt continues with occasional deviations in attention and reduced awareness, which is much improved from initial evaluation. Suspected oral/cognitive dysphagia has mostly resolved with concentrated effort to use learned swallow compensations. Pt aware of need for additional script to return to ST if warranted, as pt may benefit from further ST instruction if pt does plan to return to work. Pt verbalized understanding and agreement with ST discharge this date.    Remaining deficits: Mild inattention, distractable    Education / Equipment: Swallow strategies, word finding strategies, memory/attention compensations, functional therapeutic tasks for home, caregiver education    Patient agrees to discharge. Patient goals were met. Patient is being discharged due to meeting the stated rehab goals.          ADULT SLP TREATMENT - 01/12/21 1320       General Information   Behavior/Cognition Alert;Cooperative;Pleasant mood;Distractible      Treatment Provided   Treatment provided Cognitive-Linquistic      Cognitive-Linquistic  Treatment   Treatment focused on Aphasia;Cognition;Dysarthria;Patient/family/caregiver education    Skilled Treatment SLP reviewed HEP, in which occasional blanks noted. Pt able to self-correct errors and/or anomia intermittently, with occasional SLP modeling needed to use 3 words in sentences. Pt reports overall improvements in word finding, dysarthria, cognition, and dysphagia. Pt is agreeable to ST discharge at this time  as pt has met all ST goals. Pt aware he may request to return to ST service as need prior to returning to work in December.      Assessment / Recommendations / Plan   Plan All goals met;Discharge SLP treatment due to (comment)   POC complete     Progression Toward Goals   Progression toward goals Goals met, education completed, patient discharged from [facility] - 01/12/21 1335     Education Details functional practice, may return if needed (with script) if patient plans to return to work in December    Person(s) Educated Patient    Methods Explanation;Demonstration    Comprehension Verbalized understanding;Returned demonstration              SLP Short Term Goals - 10/13/20 1722       SLP SHORT TERM GOAL #1   Title Pt will verbalize functional application of 2 memory/attention strategies for home/work environments with occasional min A over 2 sessions    Baseline 10-06-20    Period Weeks   or 9 total visits for all STGs   Status Partially Met      SLP SHORT TERM GOAL #2   Title Pt will demonstrate speech compensations for 100% intelligibility in 10 simple conversation with rare min A over 2 sessions    Baseline 10-06-20    Status Partially Met      SLP SHORT TERM GOAL #3   Title Pt will verbalize and demonstrate recommended swallow strategies in therapy and at home with min A over 2 sessions    Baseline 09-29-20, 10-08-20    Status Achieved      SLP SHORT TERM GOAL #4   Title Pt will be able to verbalize three non-physical deficits by giving examples from home or within ST session in 2 sessions    Baseline 09-29-20    Status Partially Met              SLP Long Term Goals - 01/12/21 1326       SLP LONG TERM GOAL #1   Title Pt will verbalize functional application of 3 memory/attention strategies for home/work environments with rare min A over 2 sessions    Baseline 10-22-20, 12-03-20    Period --   or 25 total visits for all LTGs   Status  Achieved      SLP LONG TERM GOAL #2   Title Pt will demonstrate speech compensations for 100% intelligibility in 30 mod complex conversation with rare min A over 3 sessions    Baseline 12-15-20, 12-21-20, 12-29-20    Status Achieved      SLP LONG TERM GOAL #3   Title Pt and wife will report consistent carryover of recommended swallow strategies and reduced s/sx of aspiration over 3 sessions    Baseline 10-15-20, 11-03-20, 11-05-20    Status Achieved      SLP LONG TERM GOAL #4   Title Pt will demonstrate improved insight and awareness of current deficits and impact on his performance with both work and home environments  with rare min A over 2 sessions    Baseline 10-20-20, 11-03-20    Status Achieved      SLP LONG TERM GOAL #5   Title Pt will verbalize how to organize/plan/sequence day to day home/work tasks for increased functional independence with occasional min A over 2 sessions    Baseline 11-26-20, 12-03-20    Status Achieved      SLP LONG TERM GOAL #6   Title Pt will utilize word finding compensations when anomia/dysnomia occurs for 4/5 opportunities with rare min A over 2 sessions    Baseline 01-04-21, 01-12-21    Status Achieved      SLP LONG TERM GOAL #7   Title Pt will verbalize awareness of when/why swallow difficulty occurs and demonstrate how to implement swallow strategy to reduce occurance for 4/5 opportunities over 2 sessions    Baseline 12-21-20, 01-12-21    Time 2    Status Achieved              Plan - 01/12/21 1335     Clinical Impression Statement Barnabas presents with improving cognitive communication skills, rare word finding, minimal dysarthria (only when tired), and seemingly resolved (possibly cognitive based) dysphagia s/p CVA in February 2021. Pt exhibited significant improvements in all deficits as identified and targeted during ST intervention. Extensive education and training completed for anomia/dysarthria strategies, attention tasks, and carryover of swallow  precautions. Pt is agreeable to ST discharge at this time. Pt aware of need to request script to return for ST services if needed.    Treatment/Interventions Compensatory strategies;Patient/family education;Functional tasks;Cueing hierarchy;Multimodal communcation approach;Cognitive reorganization;Environmental controls;Diet toleration management by SLP;Aspiration precaution training;Compensatory techniques;Internal/external aids;SLP instruction and feedback    Potential to Achieve Goals Fair    Potential Considerations Previous level of function;Cooperation/participation level    Consulted and Agree with Plan of Care Patient             Patient will benefit from skilled therapeutic intervention in order to improve the following deficits and impairments:   Aphasia  Dysarthria and anarthria  Cognitive communication deficit  Dysphagia, unspecified type    Problem List Patient Active Problem List   Diagnosis Date Noted   Orthostatic syncope 08/06/2020   CVA (cerebral vascular accident) ([facility]) 07/21/2020   Acute CVA (cerebrovascular accident) ([facility]) 07/20/2020   Glaucoma    Hypertension    S/P CABG x 4    Coronary artery disease 07/08/2020   NSTEMI (non-ST elevated myocardial infarction) ([facility]) 07/02/2020   Hypertensive emergency 07/01/2020    Alinda Deem, MA CCC-SLP 01/12/2021, 1:44 PM  [facility] [address] [place], [place], [address] Phone: [phone]   Fax:  [phone]   Name: LUCUS LAMBERTSON MRN: [id number] Date of Birth: Jan 30, 1966

## 2021-01-26 ENCOUNTER — Ambulatory Visit (INDEPENDENT_AMBULATORY_CARE_PROVIDER_SITE_OTHER): Payer: BC Managed Care – PPO | Admitting: Neurology

## 2021-01-26 ENCOUNTER — Encounter: Payer: Self-pay | Admitting: Neurology

## 2021-01-26 VITALS — BP 163/99 | HR 62 | Ht 69.0 in | Wt 227.0 lb

## 2021-01-26 DIAGNOSIS — I639 Cerebral infarction, unspecified: Secondary | ICD-10-CM | POA: Diagnosis not present

## 2021-01-26 DIAGNOSIS — I69354 Hemiplegia and hemiparesis following cerebral infarction affecting left non-dominant side: Secondary | ICD-10-CM | POA: Diagnosis not present

## 2021-01-26 DIAGNOSIS — U099 Post covid-19 condition, unspecified: Secondary | ICD-10-CM

## 2021-01-26 DIAGNOSIS — R0683 Snoring: Secondary | ICD-10-CM

## 2021-01-26 DIAGNOSIS — R29818 Other symptoms and signs involving the nervous system: Secondary | ICD-10-CM

## 2021-01-26 DIAGNOSIS — R5382 Chronic fatigue, unspecified: Secondary | ICD-10-CM

## 2021-01-26 NOTE — Progress Notes (Signed)
SLEEP MEDICINE CLINIC    Provider:  Melvyn Novas, MD  Primary Care Physician:  Montez Hageman, [place] [address] [place] [address]     Referring Provider:  STROKE MD through Ihor Austin.        Chief Complaint according to patient   Patient presents with:     New Patient (Initial Visit)           HISTORY OF PRESENT ILLNESS:  Leonard Donovan is a 55 y.o. year old White or Caucasian male patient seen here as a referral on 01/26/2021 from Ihor Austin, NP .  Chief concern according to patient :  My sleep changed after my stroke, I always wake between 2-3 AM.    I have the pleasure of seeing Leonard Donovan today, a right -handed White or Caucasian male with a possible sleep disorder.  He   has a past medical history of CAD (coronary artery disease) of bypass graft, Glaucoma, H/O: CVA (cerebrovascular accident) (2022), Hyperlipidemia LDL goal <70, and Hypertension.. 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      Sleep relevant medical history: Nocturia/ 2 times , Sleep walking in childhood,  Family medical /sleep history: no  other family member on CPAP with OSA, insomnia, sleep walkers.    Social history:  Patient is working as a Music therapist-  and lives in a household with spouse and children,  daughter live in the same home. .The patient used to work in shifts early AM-  Tobacco use quit in 1990.  ETOH use /,  Caffeine intake in form of Coffee( /) Soda( /) Tea ( /) or energy drinks. Regular exercise in form of PT.   Hobbies : gardening.       Sleep habits are as follows: The patient's dinner time is between 6-8 PM. The patient goes to bed at 10 PM and continues to sleep for 2AM-  wakes for 2 bathroom breaks, the first time at 2 AM.   The preferred sleep position is prone , now on his side - no longer snoring after his quadruple bypass, with the support of 2 pillows. Dreams are reportedly rare. Between 5-6 AM is the usual  rise time. The patient wakes up spontaneously/.  He reports not feeling refreshed or restored in AM, with symptoms such as dry mouth, but no morning headaches, and residual fatigue. Naps are taken frequently, not scheduled, lasting from 5-15  minutes and are  refreshing .     CONE notes : Leonard Donovan returns on 11-24-2020 for a 59-month stroke follow-up to the [facility] neurologic stroke clinic.  He had no new stroke or TIA symptoms in the interval.  He had speech and language therapy, had some swallowing difficulties currently evaluated by orthopedics he still has left-sided arm and hand weakness and some shoulder pain for which she has worked with OT.  Blood pressure is elevated here today but was normal in his last clinic visit.  Stroke admission to place on 07-20-2020.  At the time the patient was less than 6 weeks from a COVID-19 infection.   Presented with left-sided facial droop, slurred speech and left upper extremity weakness evaluated by Dr. Junious Silk hospital revealing multifocal infarcts to be of cardioembolic source.  He had a CABG surgery on 2-9 2022, "had a heart attack" so he was in the hospital after he contracted COVID.and CABG for four vessels, then  stroked.  He had CT angiograms of head and neck which showed no occlusion or high-grade stenosis but bilateral carotid bifurcation bifurcation atherosclerosis.  Right more than left actually.  He had an acute pontine infarct in addition to multiple small acute and early subacute infarcts on both sides of the brain frontal and parietal white matter.  A cardiac monitor to evaluate for atrial fibrillation have been completed his echocardiogram showed an EF of 60%, hemoglobin A1c was 5.6.  He was discharged on aspirin 81 mg and Plavix clopidogrel 75 mg.  January of this year      Review of Systems: Out of a complete 14 system review, the patient complains of only the following symptoms, and all other reviewed systems are negative.:  Fatigue,  sleepiness , snoring, fragmented sleep, Insomnia    How likely are you to doze in the following situations: 0 = not likely, 1 = slight chance, 2 = moderate chance, 3 = high chance   Sitting and Reading? Watching Television? Sitting inactive in a public place (theater or meeting)? As a passenger in a car for an hour without a break? Lying down in the afternoon when circumstances permit? Sitting and talking to someone? Sitting quietly after lunch without alcohol? In a car, while stopped for a few minutes in traffic?   Total = 19/ 24 points   FSS endorsed at 60/ 63 points.   GDS 9-15 points, high depression score   Social History   Socioeconomic History   Marital status: Married    Spouse name: Not on file   Number of children: Not on file   Years of education: Not on file   Highest education level: Not on file  Occupational History   Not on file  Tobacco Use   Smoking status: Former   Smokeless tobacco: Former  Building services engineer Use: Never used  Substance and Sexual Activity   Alcohol use: Not Currently   Drug use: Not Currently   Sexual activity: Not on file  Other Topics Concern   Not on file  Social History Narrative   Not on file   Social Determinants of Health   Financial Resource Strain: Not on file  Food Insecurity: Not on file  Transportation Needs: Not on file  Physical Activity: Not on file  Stress: Not on file  Social Connections: Not on file    Family History  Problem Relation Age of Onset   Hypertension Father    CAD Sister        diagnosed in her 50s   Heart attack Sister     Past Medical History:  Diagnosis Date   CAD (coronary artery disease) of bypass graft    CABG x 4   Glaucoma    H/O: CVA (cerebrovascular accident) 2022   Hyperlipidemia LDL goal <70    Hypertension     Past Surgical History:  Procedure Laterality Date   CORONARY ARTERY BYPASS GRAFT N/A 07/08/2020   Procedure: CORONARY ARTERY BYPASS GRAFTING (CABG), ON PUMP,  TIMES FOUR, LEFT INTERNAL MAMMARY ARTERY TO LAD, RIGHT SVG TO PDA, DISTAL CIRCUMFLEX, AND OM1;  Surgeon: Delight Ovens, MD;  Location: [facility];  Service: Open Heart Surgery;  Laterality: N/A;   ENDOVEIN HARVEST OF GREATER SAPHENOUS VEIN Right 07/08/2020   Procedure: ENDOVEIN HARVEST OF RIGHT GREATER SAPHENOUS VEIN;  Surgeon: Delight Ovens, MD;  Location: [facility] OR;  Service: Open Heart Surgery;  Laterality: Right;   LEFT HEART CATH AND CORONARY ANGIOGRAPHY N/A  07/02/2020   Procedure: LEFT HEART CATH AND CORONARY ANGIOGRAPHY;  Surgeon: Corky Crafts, MD;  Location: [facility] IN[facility]IVE [facility];  Service: Cardiovascular;  Laterality: N/A;   NO PAST SURGERIES     TEE WITHOUT CARDIOVERSION N/A 07/08/2020   Procedure: TRANSESOPHAGEAL ECHOCARDIOGRAM (TEE);  Surgeon: Delight Ovens, MD;  [facility]: [facility];  Service: Open Heart Surgery;  Laterality: N/A;     Current Outpatient Medications on File Prior to Visit  Medication Sig Dispense Refill   acetaminophen (TYLENOL) 500 MG tablet Take 2 tablets (1,000 mg total) by mouth every 8 (eight) hours as needed for mild pain (or discomfort). 30 tablet 0   aspirin EC 81 MG EC tablet Take 1 tablet (81 mg total) by mouth daily. Swallow whole. 30 tablet 11   atorvastatin (LIPITOR) 80 MG tablet Take 1 tablet (80 mg total) by mouth daily. 90 tablet 3   carvedilol (COREG) 6.25 MG tablet Take 1 tablet (6.25 mg total) by mouth 2 (two) times daily. 180 tablet 3   clopidogrel (PLAVIX) 75 MG tablet Take 1 tablet (75 mg total) by mouth daily. 90 tablet 3   Multiple Vitamins-Minerals (MULTIVITAMIN WITH MINERALS) tablet Take 1 tablet by mouth daily.     valsartan (DIOVAN) 160 MG tablet Take 1 tablet (160 mg total) by mouth daily. 30 tablet 3   amLODipine (NORVASC) 10 MG tablet Take 1 tablet (10 mg total) by mouth daily. 180 tablet 3   nitroGLYCERIN (NITROSTAT) 0.4 MG SL tablet Place 1 tablet (0.4 mg total) under the tongue every 5 (five) minutes as needed. 25 tablet 3   No  current facility-administered medications on file prior to visit.    No Known Allergies  Physical exam:  Today's Vitals   01/26/21 1511  BP: (!) 163/99  Pulse: 62  Weight: 227 lb (103 kg)  Height: 5\' 9"  (1.753 m)   Body mass index is 33.52 kg/m.   Wt Readings from Last 3 Encounters:  01/26/21 227 lb (103 kg)  12/16/20 223 lb 12.8 oz (101.5 kg)  11/24/20 221 lb (100.2 kg)     Ht Readings from Last 3 Encounters:  01/26/21 5\' 9"  (1.753 m)  12/16/20 5\' 10"  (1.778 m)  11/24/20 5\' 7"  (1.702 m)      General: The patient is awake, alert and appears not in acute distress. The patient is well groomed. Head: Normocephalic, atraumatic. Neck is supple. Mallampati 3- tongue deviated to the left- ,  neck circumference:18 inches . Nasal airflow  patent.  Retrognathia is not seen.  Dental status:  Cardiovascular:  Regular rate and cardiac rhythm by pulse,  without distended neck veins. Respiratory: Lungs are clear to auscultation.  Skin:  Without evidence of ankle edema, or rash. Trunk: The patient's posture is erect.   Neurologic exam : The patient is awake and alert, oriented to place and time.   Memory subjective described as intact.  Attention span & concentration ability appears normal.  Speech is fluent,  without  dysarthria, dysphonia or aphasia.  Mood and affect are appropriate.   Cranial nerves: no loss of smell or taste reported  Pupils are equal and briskly reactive to light. Funduscopic exam deferred. .  Extraocular movements in vertical and horizontal planes were intact and without nystagmus. No Diplopia. Visual fields by finger perimetry are intact. Hearing was intact to soft voice and finger rubbing.    Facial sensation intact to fine touch.  Facial motor strength is not symmetric , left eye is wider open,  left angle of the mouth, and  left tongue deviation.  Neck ROM : rotation, tilt and flexion extension were normal for age and shoulder shrug was symmetrical.     Motor exam:  Symmetric bulk, tone and ROM.   Normal tone without cog wheeling, symmetric grip strength .   Sensory:  Fine touch, pinprick and vibration were tested  and  normal.  Proprioception tested in the upper extremities was normal.   Coordination: Rapid alternating movements in the fingers/hands were of normal speed.  The Finger-to-nose maneuver was intact without evidence of ataxia, dysmetria or tremor.   Gait and station: Patient could rise unassisted from a seated position, walked without assistive device. Drifts to the left.  Deep tendon reflexes: in the  upper extremities were brisk on the left elbow an antebrachial-  lower extremities are symmetric and intact.  Very brisk both patella reflexes. .  Babinski response was deferred       After spending a total time of  45  minutes face to face and additional time for physical and neurologic examination, review of laboratory studies,  personal review of imaging studies, reports and results of other testing and review of referral information / records as far as provided in visit, I have established the following assessments:  1)  embolic strokes suspected, 6 month ago-  post COVID developed chest pain, was unvaccinated. He strokes after 4 vessel bypass.  No atrial fib.    My Plan is to proceed with:  1) HST or in lab sleep study to screen for OSA.    I would like to thank Montez Hagemanurner, Brandywine C, DO and Janeece Riggersurner, Ash[place] C, Do [address] [place],  [place] [id number] for allowing me to meet with and to take care of this pleasant patient.   In short, Leonard Donovan is presenting post stroke and post 4 vessel bypass, had MI after Covid infection.  I plan to follow up either personally or through our NP within 2-4  month.   CC: I will share my notes with PCP. Marland Kitchen.  Electronically signed by: Melvyn Novasarmen Ketina Mars, MD 01/26/2021 3:38 PM  [facility] certified by The [employer] of Sleep Medicine and  Diplomate of the [employer] of [employer]. Board certified In Neurology through the ABPN, Fellow of the [employer] of [employer]. Medical Director of [facility].

## 2021-01-27 ENCOUNTER — Telehealth: Payer: Self-pay | Admitting: *Deleted

## 2021-01-27 NOTE — Telephone Encounter (Signed)
Received STD papers for Sept, patient must turn in by 10th of each month. Papers completed, on NP's desk for review, signature.

## 2021-01-27 NOTE — Telephone Encounter (Signed)
Signed and placed in outbox.  Thank you. ?

## 2021-01-27 NOTE — Telephone Encounter (Signed)
STD papers sent to medical records for processing. ?

## 2021-02-05 LAB — HEPATIC FUNCTION PANEL
ALT: 10 IU/L (ref 0–44)
AST: 18 IU/L (ref 0–40)
Albumin: 4.9 g/dL (ref 3.8–4.9)
Alkaline Phosphatase: 106 IU/L (ref 44–121)
Bilirubin Total: 0.9 mg/dL (ref 0.0–1.2)
Bilirubin, Direct: 0.21 mg/dL (ref 0.00–0.40)
Total Protein: 7.3 g/dL (ref 6.0–8.5)

## 2021-02-05 LAB — LIPID PANEL
Chol/HDL Ratio: 3.6 ratio (ref 0.0–5.0)
Cholesterol, Total: 148 mg/dL (ref 100–199)
HDL: 41 mg/dL (ref >39)
LDL Chol Calc (NIH): 77 mg/dL (ref 0–99)
Triglycerides: 177 mg/dL — ABNORMAL HIGH (ref 0–149)
VLDL Cholesterol Cal: 30 mg/dL (ref 5–40)

## 2021-02-13 NOTE — Progress Notes (Signed)
HPI: Follow-up coronary artery disease.  Patient ruled in for non-ST elevation myocardial infarction February 2022.  Cardiac catheterization revealed severe three-vessel coronary artery disease.  Echocardiogram showed normal LV function.  Patient had coronary artery bypass graft with LIMA to the LAD, saphenous vein graft to the PDA, distal circumflex and OM1.  Note preoperative carotid Dopplers showed 40 to 59% right carotid artery stenosis.  Following his discharge he returned due to slurred speech and left upper extremity weakness and was found to have CVA.  Repeat echocardiogram showed normal LV function.  Outpatient monitor April 2022 showed sinus rhythm with no atrial fibrillation.  Following his discharge he was seen in the office with 3 episodes of syncope that was felt likely to be orthostatic mediated.  He had 1 in the office and systolic blood pressure was in the 80s.  His medications were discontinued.  We have gradually increased his medications as an outpatient.  Since last seen he denies dyspnea, chest pain, palpitations or syncope.  His systolic blood pressure is running in the high 120s to 130s.  Current Outpatient Medications  Medication Sig Dispense Refill   acetaminophen (TYLENOL) 500 MG tablet Take 2 tablets (1,000 mg total) by mouth every 8 (eight) hours as needed for mild pain (or discomfort). 30 tablet 0   aspirin EC 81 MG EC tablet Take 1 tablet (81 mg total) by mouth daily. Swallow whole. 30 tablet 11   atorvastatin (LIPITOR) 80 MG tablet Take 1 tablet (80 mg total) by mouth daily. 90 tablet 3   carvedilol (COREG) 6.25 MG tablet Take 1 tablet (6.25 mg total) by mouth 2 (two) times daily. 180 tablet 3   clopidogrel (PLAVIX) 75 MG tablet Take 1 tablet (75 mg total) by mouth daily. 90 tablet 3   Multiple Vitamins-Minerals (MULTIVITAMIN WITH MINERALS) tablet Take 1 tablet by mouth daily.     valsartan (DIOVAN) 160 MG tablet Take 1 tablet (160 mg total) by mouth daily. 30  tablet 3   amLODipine (NORVASC) 10 MG tablet Take 1 tablet (10 mg total) by mouth daily. 180 tablet 3   nitroGLYCERIN (NITROSTAT) 0.4 MG SL tablet Place 1 tablet (0.4 mg total) under the tongue every 5 (five) minutes as needed. 25 tablet 3   No current facility-administered medications for this visit.     Past Medical History:  Diagnosis Date   CAD (coronary artery disease) of bypass graft    CABG x 4   Glaucoma    H/O: CVA (cerebrovascular accident) 2022   Hyperlipidemia LDL goal <70    Hypertension     Past Surgical History:  Procedure Laterality Date   CORONARY ARTERY BYPASS GRAFT N/A 07/08/2020   Procedure: CORONARY ARTERY BYPASS GRAFTING (CABG), ON PUMP, TIMES FOUR, LEFT INTERNAL MAMMARY ARTERY TO LAD, RIGHT SVG TO PDA, DISTAL CIRCUMFLEX, AND OM1;  Surgeon: Delight Ovens, MD;  Location: [facility] OR;  Service: Open Heart Surgery;  Laterality: N/A;   ENDOVEIN HARVEST OF GREATER SAPHENOUS VEIN Right 07/08/2020   Procedure: ENDOVEIN HARVEST OF RIGHT GREATER SAPHENOUS VEIN;  Surgeon: Delight Ovens, MD;  Location: [facility] OR;  Service: Open Heart Surgery;  Laterality: Right;   LEFT HEART CATH AND CORONARY ANGIOGRAPHY N/A 07/02/2020   Procedure: LEFT HEART CATH AND CORONARY ANGIOGRAPHY;  Surgeon: Corky Crafts, MD;  Location: [facility] INVASIVE CV LAB;  Service: Cardiovascular;  Laterality: N/A;   NO PAST SURGERIES     TEE WITHOUT CARDIOVERSION N/A 07/08/2020   Procedure: TRANSESOPHAGEAL  ECHOCARDIOGRAM (TEE);  Surgeon: Delight Ovens, MD;  Location: [facility] OR;  Service: Open Heart Surgery;  Laterality: N/A;    Social History   Socioeconomic History   Marital status: Married    Spouse name: Not on file   Number of children: Not on file   Years of education: Not on file   Highest education level: Not on file  Occupational History   Not on file  Tobacco Use   Smoking status: Former   Smokeless tobacco: Former  Building services engineer Use: Never used  Substance and Sexual Activity    Alcohol use: Not Currently   Drug use: Not Currently   Sexual activity: Not on file  Other Topics Concern   Not on file  Social History Narrative   Not on file   Social Determinants of Health   Financial Resource Strain: Not on file  Food Insecurity: Not on file  Transportation Needs: Not on file  Physical Activity: Not on file  Stress: Not on file  Social Connections: Not on file  Intimate Partner Violence: Not on file    Family History  Problem Relation Age of Onset   Hypertension Father    CAD Sister        diagnosed in her 40s   Heart attack Sister     ROS: no fevers or chills, productive cough, hemoptysis, dysphasia, odynophagia, melena, hematochezia, dysuria, hematuria, rash, seizure activity, orthopnea, PND, pedal edema, claudication. Remaining systems are negative.  Physical Exam: Well-developed well-nourished in no acute distress.  Skin is warm and dry.  HEENT is normal.  Neck is supple.  Chest is clear to auscultation with normal expansion.  Cardiovascular exam is regular rate and rhythm.  Abdominal exam nontender or distended. No masses palpated. Extremities show no edema. neuro grossly intact  A/P  1 coronary artery disease status post coronary artery bypass and graft-plan to continue aspirin, Plavix and statin.  He will discontinue Plavix February 2023.  2 hypertension-blood pressure is elevated; increase carvedilol to 12.5 mg twice daily and follow.  3 hyperlipidemia-continue statin.  Recent LDL 70.  Add Zetia 10 mg daily.  Check lipids and liver in 12 weeks.  4 carotid artery disease-plan follow-up carotid Dopplers February 2023.  5 prior CVA-continue aspirin and Plavix.  As outlined previously his monitor showed no atrial fibrillation.  [name], MD

## 2021-02-16 ENCOUNTER — Ambulatory Visit (INDEPENDENT_AMBULATORY_CARE_PROVIDER_SITE_OTHER): Payer: BC Managed Care – PPO | Admitting: Cardiology

## 2021-02-16 ENCOUNTER — Encounter: Payer: Self-pay | Admitting: Cardiology

## 2021-02-16 ENCOUNTER — Other Ambulatory Visit: Payer: Self-pay

## 2021-02-16 VITALS — BP 160/92 | HR 67 | Ht 69.0 in | Wt 228.8 lb

## 2021-02-16 DIAGNOSIS — I10 Essential (primary) hypertension: Secondary | ICD-10-CM

## 2021-02-16 DIAGNOSIS — E7800 Pure hypercholesterolemia, unspecified: Secondary | ICD-10-CM | POA: Diagnosis not present

## 2021-02-16 DIAGNOSIS — I25810 Atherosclerosis of coronary artery bypass graft(s) without angina pectoris: Secondary | ICD-10-CM | POA: Diagnosis not present

## 2021-02-16 MED ORDER — CARVEDILOL 12.5 MG PO TABS: 12.5000 mg | ORAL_TABLET | Freq: Two times a day (BID) | ORAL | 3 refills | Status: DC

## 2021-02-16 MED ORDER — EZETIMIBE 10 MG PO TABS: 10.0000 mg | ORAL_TABLET | Freq: Every day | ORAL | 3 refills | Status: DC

## 2021-02-16 MED ORDER — AMLODIPINE BESYLATE 10 MG PO TABS: 10.0000 mg | ORAL_TABLET | Freq: Every day | ORAL | 3 refills | Status: DC

## 2021-02-16 NOTE — Patient Instructions (Signed)
Medication Instructions:   INCREASE CARVEDILOL TO 12.5 MG TWICE DAILY= 2 OF THE 6.25 MG TABLETS TWICE DAILY  START EZETIMIBE 10 MG ONCE DAILY  *If you need a refill on your cardiac medications before your next appointment, please call your pharmacy*   Lab Work:  Your physician recommends that you return for lab work in: 3 MONTHS-FASTING  If you have labs (blood work) drawn today and your tests are completely normal, you will receive your results only by: MyChart Message (if you have MyChart) OR A paper copy in the mail If you have any lab test that is abnormal or we need to change your treatment, we will call you to review the results.   Follow-Up: At [facility], you and your health needs are our priority.  As part of our continuing mission to provide you with exceptional heart care, we have created designated Provider Care Teams.  These Care Teams include your primary Cardiologist (physician) and Advanced Practice Providers (APPs -  Physician Assistants and Nurse Practitioners) who all work together to provide you with the care you need, when you need it.  We recommend signing up for the patient portal called "MyChart".  Sign up information is provided on this After Visit Summary.  MyChart is used to connect with patients for Virtual Visits (Telemedicine).  Patients are able to view lab/test results, encounter notes, upcoming appointments, etc.  Non-urgent messages can be sent to your provider as well.   To learn more about what you can do with MyChart, go to [email or web address].    Your next appointment:   6 month(s)  The format for your next appointment:   In Person  Provider:   [name], MD

## 2021-02-22 ENCOUNTER — Ambulatory Visit: Payer: BC Managed Care – PPO | Admitting: General Practice

## 2021-02-24 ENCOUNTER — Encounter: Payer: Self-pay | Admitting: Adult Health

## 2021-02-24 ENCOUNTER — Ambulatory Visit (INDEPENDENT_AMBULATORY_CARE_PROVIDER_SITE_OTHER): Payer: BC Managed Care – PPO | Admitting: Adult Health

## 2021-02-24 ENCOUNTER — Ambulatory Visit (INDEPENDENT_AMBULATORY_CARE_PROVIDER_SITE_OTHER): Payer: BC Managed Care – PPO | Admitting: Neurology

## 2021-02-24 VITALS — BP 155/98 | HR 65 | Ht 69.0 in | Wt 231.0 lb

## 2021-02-24 DIAGNOSIS — G4733 Obstructive sleep apnea (adult) (pediatric): Secondary | ICD-10-CM

## 2021-02-24 DIAGNOSIS — F0789 Other personality and behavioral disorders due to known physiological condition: Secondary | ICD-10-CM

## 2021-02-24 DIAGNOSIS — I639 Cerebral infarction, unspecified: Secondary | ICD-10-CM | POA: Diagnosis not present

## 2021-02-24 DIAGNOSIS — I69354 Hemiplegia and hemiparesis following cerebral infarction affecting left non-dominant side: Secondary | ICD-10-CM

## 2021-02-24 DIAGNOSIS — F482 Pseudobulbar affect: Secondary | ICD-10-CM

## 2021-02-24 DIAGNOSIS — R0683 Snoring: Secondary | ICD-10-CM

## 2021-02-24 DIAGNOSIS — I10 Essential (primary) hypertension: Secondary | ICD-10-CM | POA: Diagnosis not present

## 2021-02-24 DIAGNOSIS — G9332 Myalgic encephalomyelitis/chronic fatigue syndrome: Secondary | ICD-10-CM

## 2021-02-24 DIAGNOSIS — G4719 Other hypersomnia: Secondary | ICD-10-CM

## 2021-02-24 DIAGNOSIS — E785 Hyperlipidemia, unspecified: Secondary | ICD-10-CM

## 2021-02-24 DIAGNOSIS — R29818 Other symptoms and signs involving the nervous system: Secondary | ICD-10-CM | POA: Diagnosis not present

## 2021-02-24 DIAGNOSIS — U099 Post covid-19 condition, unspecified: Secondary | ICD-10-CM

## 2021-02-24 MED ORDER — NUEDEXTA 20-10 MG PO CAPS: 1.0000 | ORAL_CAPSULE | Freq: Two times a day (BID) | ORAL | 5 refills | Status: DC

## 2021-02-24 NOTE — Patient Instructions (Addendum)
Continue aspirin 81 mg daily and clopidogrel 75 mg daily  and atorvastatin and zetia  for secondary stroke prevention  Continue to follow up with PCP regarding cholesterol and blood pressure management  Maintain strict control of hypertension with blood pressure goal below 130/90 and cholesterol with LDL cholesterol (bad cholesterol) goal below 70 mg/dL.   Recommend starting Nudexta for pseudobulbar affect starting taking 1 tab daily for 7 days then increase to twice daily thereafter       Followup in the future with me in 4 months or call earlier if needed       Thank you for coming to see [place] at [facility]. I hope we have been able to provide you high quality care today.  You may receive a patient satisfaction survey over the next few weeks. We would appreciate your feedback and comments so that we may continue to improve ourselves and the health of our patients.

## 2021-02-24 NOTE — Progress Notes (Signed)
[facility] [address] [place]. [place] [address] [phone]       STROKE FOLLOW UP NOTE  [name] Date of Birth:  [birth year] Medical Record Number:  [id number]   Reason for Referral: stroke follow up    SUBJECTIVE:   CHIEF COMPLAINT:  Chief Complaint  Patient presents with   Ischemic Stroke    Rm 3, 3 month FU, wife- Leonard Donovan  "no new concerns"      HPI:   Today, 02/16/2021, Leonard Donovan returns for 24-monthstroke follow-up accompanied by his wife, CAngela Donovan  Overall doing well.  Completed SLP 8/16 as he met all goals and only rare word finding difficulty and occasional deviations in attention and reduced awareness.  Left-sided weakness with some improvement since prior visit.  Followed by orthopedics for adhesive capsulitis and currently working with PT noting some improvement. Continued occassional imbalance and occasional swallowing difficulties but seems to be more related to inattention.  He has not previously worked with PT for imbalance as he was previously working with PT for cardiac rehab. Per wife, continued uncontrolled crying and occasional uncontrolled laughing -previously discussed Nuedexta for PBA -declined interest at that time but now interested in trialing.  Remains on disability previously working in [employer]  Denies new stroke/TIA symptoms.  Remains on aspirin and Plavix as well as atorvastatin and zetia without side effects.  Blood pressure today 155/98. Routinely monitors at home - cardiology recently increased BP meds last week.  Evaluated by Dr. DBrett Fairyfor concern of possible sleep apnea and plans on completing HST tonight.  No further concerns at this time    History provided for reference purposes only Update 11/24/2020 JM: Mr. MLadareturns for 333-monthtroke follow-up accompanied by his wife.  Patient reports residual left-sided weakness/pain, occasional imbalance and fatigue but overall improving.  Currently working with  OT for residual left-sided weakness and shoulder pain -currently being followed by orthopedics with MRI completed yesterday and has scheduled visit next week for further evaluation due to continued shoulder pain interfering with therapy sessions as well as sleep.  Also continues to work with SLP for aphasia, dysarthria and cognitive communication deficit.  Completed MBS 4/5 which was normal although he does have occasional difficulty swallowing water.  Denies new stroke/TIA symptoms. Remains on aspirin and Plavix as well as atorvastatin and zetia without associated side effects.  Blood pressure today 125/83. Completed 30-day cardiac event monitor which was negative for atrial fibrillation. He does report daytime fatigue as well as insomnia -has not previously underwent sleep study.  Wife is concerned regarding labile emotions where he may laugh or cry uncontrollably during situations that may not be that funny or sad.  No further concerns at this time.  Initial visit 08/24/2020 JM: Leonard Donovan being seen for hospital follow-up accompanied by his wife  Reports residual left-sided weakness, left facial droop, swallowing difficulties and cognitive difficulties Evaluated by [place] SLP 3/7 - noted mild cognitive linguistic deficits; no concerns of aspiration -personally reviewed evaluation note -no further SLP recommended as patient and wife reported that he was at baseline At todays visit, wife reports occasional confusion and delayed processing which patient agrees with Report of swallowing difficulties only with water otherwise denies difficulty Denies new stroke/TIA symptoms 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 Has cards f/u on Wednesday Currently wearing cardiac monitor which will be completed on 4/3  No further concerns at this time  Stroke admission 07/20/2020 Leonard Donovan is a 55 y.o. male with history of hypertension, coronary artery disease (s/p quadruple CABG on 07/08/2020), glaucoma, COVID-19 infection (sx on 06/07/2020, dx on 06/18/2020),  who presented on 07/20/2020 with left sided facial droop, slurred speech, and left upper extremity weakness.   Personally reviewed hospitalization pertinent progress notes, lab work and imaging with summary provided.  Evaluated by Dr. Erlinda Hong with stroke work-up revealing multifocal infarcts, largest at right pontine, likely related to recent cardiac surgery however other cardioembolic source cannot be ruled out.  Recommended 30-day cardiac event monitor outpatient to rule out A. fib.  On DAPT PTA and recommended continuation at discharge per cardiology recommendations s/p CABG 07/08/2020.  LDL 49 on atorvastatin 80 mg daily.  Other stroke risk factors include former tobacco use, obesity and suspected OSA.  Residual deficit of mild left facial droop, left hemiparesis and moderate cognitive deficits.  Stroke:  Multifocal infarcts, largest at right pontine, likely related to recent cardiac surgery. However, other cardioembolic source can not rule out Code Stroke CT head: Small focus of hypodensity in the right white matter. CTA head & neck: no intracranial arterial occlusion or high-grade stenosis. Bilateral carotid bifurcation atherosclerosis, right more than left MRI Acute right pontine infarct in addition to multiple small acute to early subacute infarcts within the bilateral frontal and parietal white matter, consistent with a central (cardiac or aortic) embolic source. No hemorrhage or mass effect. Recommend 30 day monitor to evaluate for  atrial fibrillation as the source for stroke 2D Echo: EF 60 to 65% LDL 49 HgbA1c 5.6 VTE prophylaxis - Lovenox 48m daily Swallow eval: passed for heart healthy diet aspirin 81 mg daily and Plavix prior to admission, continue aspirin 81 mg daily and clopidogrel 75 mg daily DAPT on discharge. Therapy recommendations:  outpt SLP Disposition: home         ROS:   14 system review of systems performed and negative with exception of those listed in HPI  PMH:  Past Medical History:  Diagnosis Date   CAD (coronary artery disease) of bypass graft    CABG x 4   Glaucoma    H/O: CVA (cerebrovascular accident) 2022   Hyperlipidemia LDL goal <70    Hypertension     PSH:  Past Surgical History:  Procedure Laterality Date   CORONARY ARTERY BYPASS GRAFT N/A 07/08/2020   Procedure: CORONARY ARTERY BYPASS GRAFTING (CABG), ON PUMP, TIMES FOUR, LEFT INTERNAL MAMMARY ARTERY TO LAD, RIGHT SVG TO PDA, DISTAL CIRCUMFLEX, AND OM1;  Surgeon: GGrace Isaac MD;  [place]  Service: Open Heart Surgery;  Laterality: N/A;   ENDOVEIN HARVEST OF GREATER SAPHENOUS VEIN Right 07/08/2020   Procedure: ENDOVEIN HARVEST OF RIGHT GREATER SAPHENOUS VEIN;  Surgeon: GGrace Isaac MD;  [place]  Service: Open Heart Surgery;  Laterality: Right;   LEFT HEART CATH AND CORONARY ANGIOGRAPHY N/A 07/02/2020   Procedure: LEFT HEART CATH AND CORONARY ANGIOGRAPHY;  Surgeon: VJettie Booze MD;  [place] [facility];  Service: Cardiovascular;  Laterality: N/A;   NO PAST SURGERIES     TEE WITHOUT CARDIOVERSION N/A 07/08/2020   Procedure: TRANSESOPHAGEAL  ECHOCARDIOGRAM (TEE);  Surgeon: Grace Isaac, MD;  Location: [place];  Service: Open Heart Surgery;  Laterality: N/A;    Social History:  Social History   Socioeconomic History   Marital status: Married    Spouse name: Designer, jewellery   Number of children: Not on file   Years of education: Not on file   Highest education level: Not on file   Occupational History   Not on file  Tobacco Use   Smoking status: Former   Smokeless tobacco: Former  Scientific laboratory technician Use: Never used  Substance and Sexual Activity   Alcohol use: Not Currently   Drug use: Not Currently   Sexual activity: Not on file  Other Topics Concern   Not on file  Social History Narrative   Lives with wife   Social Determinants of Health   Financial Resource Strain: Not on file  Food Insecurity: Not on file  Transportation Needs: Not on file  Physical Activity: Not on file  Stress: Not on file  Social Connections: Not on file  Intimate Partner Violence: Not on file    Family History:  Family History  Problem Relation Age of Onset   Hypertension Father    CAD Sister        diagnosed in her 9s   Heart attack Sister     Medications:   Current Outpatient Medications on File Prior to Visit  Medication Sig Dispense Refill   acetaminophen (TYLENOL) 500 MG tablet Take 2 tablets (1,000 mg total) by mouth every 8 (eight) hours as needed for mild pain (or discomfort). 30 tablet 0   amLODipine (NORVASC) 10 MG tablet Take 1 tablet (10 mg total) by mouth daily. 180 tablet 3   aspirin EC 81 MG EC tablet Take 1 tablet (81 mg total) by mouth daily. Swallow whole. 30 tablet 11   atorvastatin (LIPITOR) 80 MG tablet Take 1 tablet (80 mg total) by mouth daily. 90 tablet 3   carvedilol (COREG) 12.5 MG tablet Take 1 tablet (12.5 mg total) by mouth 2 (two) times daily. 180 tablet 3   clopidogrel (PLAVIX) 75 MG tablet Take 1 tablet (75 mg total) by mouth daily. 90 tablet 3   ezetimibe (ZETIA) 10 MG tablet Take 1 tablet (10 mg total) by mouth daily. 90 tablet 3   Multiple Vitamins-Minerals (MULTIVITAMIN WITH MINERALS) tablet Take 1 tablet by mouth daily.     valsartan (DIOVAN) 160 MG tablet Take 1 tablet (160 mg total) by mouth daily. 30 tablet 3   nitroGLYCERIN (NITROSTAT) 0.4 MG SL tablet Place 1 tablet (0.4 mg total) under the tongue every 5 (five) minutes as  needed. 25 tablet 3   No current facility-administered medications on file prior to visit.    Allergies:  No Known Allergies    OBJECTIVE:  Physical Exam  Vitals:   02/24/21 1504  BP: (!) 155/98  Pulse: 65  Weight: 231 lb (104.8 kg)  Height: _0  (1.753 m)    Body mass index is 34.11 kg/m. No results found.  General: well developed, well nourished,  pleasant middle-age Caucasian male, seated, in no evident distress Head: head normocephalic and atraumatic.   Neck: supple with no carotid or supraclavicular bruits Cardiovascular: regular rate and rhythm, no murmurs Musculoskeletal: no deformity Skin:  no rash/petichiae Vascular:  Normal pulses all extremities   Neurologic Exam Mental Status: Awake and fully alert. Fluent speech and language. Oriented to place and time. Recent and remote memory intact. Attention span,  concentration and fund of knowledge fluctuated during visit. Mood and affect appropriate.   Cranial Nerves: Pupils equal, briskly reactive to light. Extraocular movements full without nystagmus. Visual fields full to confrontation. Hearing intact. Facial sensation intact.  Slight left lower facial weakness when smiling.  Tongue and palate moves normally and symmetrically.  Motor: Normal bulk and tone and strength right upper and lower extremity LUE: 5/5 deltoid and elbow extension; 4+/5 elbow flexion and handgrip.  Limited L shoulder ROM. decreased finger dexterity LLE: 5/5 Sensory.: intact to touch , pinprick , position and vibratory sensation.  Coordination: Rapid alternating movements normal in all extremities except slightly decreased left hand. Finger-to-nose and heel-to-shin performed accurately bilaterally.  Orbits right arm over left arm. Gait and Station: Arises from chair without difficulty. Stance is normal. Gait demonstrates normal stride length with mild imbalance without use of assistive device. Tandem walk and heel toe without difficulty.  Romberg  positive. Reflexes: 1+ and symmetric. Toes downgoing.        ASSESSMENT: Leonard Donovan is a 55 y.o. year old male presented with left facial droop, slurred speech and LUE weakness on 07/20/2020 with stroke work-up revealing multifocal infarcts, largest at right pontine, likely related to recent cardiac surgery (s/p quadruple CABG 2/9) although other cardioembolic sources cannot be ruled out.  Vascular risk factors include HTN, HLD, CAD s/p CABG 06/2020, COVID-19 infection 05/2020, former tobacco use and suspected OSA.      PLAN:  Multifocal infarcts:  Residual deficit: mild LUE weakness with decreased hand dexterity, cognitive impairment (high level executive functioning and inattention), left facial weakness and gait impairment with imbalance.  Completed SLP -discussed importance of continued HEP.  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  Start Nuedexta 1 capsule daily for the first 7 days then increase to twice daily. HTN: BP goal <130/90.  Well-controlled on current regimen per PCP/cardiology HLD: LDL goal <70. Recent LDL 77 on atorvastatin 80 mg daily and Zetia per cards  Suspected OSA: eval by Dr. Brett Fairy - scheduled HST tonight CAD s/p CABG: Followed closely by cardiology.  Remains on DAPT with plans on continuing until 06/2021 then aspirin alone per cardiology  note    Follow up in 4 months or call earlier if needed   CC:  PCP: Gara Kroner, DO    I spent 36 minutes of face-to-face and non-face-to-face time with patient and wife.  This included previsit chart review, lab review, study review, order entry, electronic health record documentation, and patient and wife education and discussion regarding prior stroke including secondary stroke prevention measures and aggressive stroke risk factor management, residual deficits and further recovery time, likely pseudobulbar affect and treatment options, suspected underlying sleep apnea and answered all other questions to patient and wife's satisfaction  Frann Rider, AGNP-BC  [facility] [address] [place], [place] [address]  Phone [phone] Fax [phone] Note: This document was prepared with digital dictation and  possible smart Company secretary. Any transcriptional errors that result from this process are unintentional.

## 2021-02-25 ENCOUNTER — Telehealth: Payer: Self-pay | Admitting: *Deleted

## 2021-02-25 NOTE — Telephone Encounter (Signed)
Nuedexta PA. Key: [id number], F48.2, [id number]. faxed office note to be attached to PA on CMM.

## 2021-02-25 NOTE — Telephone Encounter (Signed)
Received e mail that documents were attached, sent PA to plan. Your information has been submitted to [employer].  If [employer] has not responded to your request within 24 hours, contact [employer] at [phone].

## 2021-03-01 NOTE — Telephone Encounter (Signed)
Nudexta approved 02/25/21 - 02/25/22. Faxed approval letter to pharmacy.

## 2021-03-02 ENCOUNTER — Telehealth: Payer: Self-pay | Admitting: *Deleted

## 2021-03-02 NOTE — Telephone Encounter (Signed)
Pt short term form @ front desk for p/u

## 2021-03-02 NOTE — Telephone Encounter (Signed)
STD papers completed, signed by NP and sent to medical records for processing.

## 2021-03-08 ENCOUNTER — Telehealth: Payer: Self-pay | Admitting: *Deleted

## 2021-03-08 NOTE — Telephone Encounter (Signed)
I spoke with the patient's wife, patient has no new cardiac issues since the last cardiology visit.  He is able to ambulate on the treadmill for 10 to 15 minutes without any exertional symptoms.  [name] is a 55 year old male was past medical history of CAD s/p CABG, acute CVA in February 2022, hypertension, prediabetes and orthostatic hypotension was presyncope.  Patient was initially admitted to the hospital in early February 2022 with NSTEMI and underwent revascularization via CABG x4 LIMA-LAD, SVG-PDA, distal left circumflex and OM1 by Dr. Tyrone Sage on 07/08/2020.  Postop course complicated by acute CVA after discharge.  During office visit, he had orthostatic hypotension and presyncope.  Blood pressure medications were initially removed then slowly restarted back as outpatient.  Dr. Jens Som, since [name] is doing well, I plan to clear the patient to proceed with surgery.  Surgeon's office request him to hold aspirin and Plavix.  Would you be okay with him holding aspirin and Plavix for 5 days prior to the surgery and restart as soon as possible afterward?  Since the patient had a stroke after the NSTEMI in February, do you wish for me to send this message to Ihor Austin to see if the patient can come off of Plavix from neurology perspective as well?  Please forward your response to [facility] PREOP

## 2021-03-08 NOTE — Telephone Encounter (Signed)
   [facility] Pre-operative Risk Assessment    Patient Name: Leonard Donovan  DOB: [birth year] MRN: [id number]    Request for surgical clearance:  What type of surgery is being performed? LEFT SHOULDER SCOPE WITH LYSIS AND RESECTION OF ADHESIONS WITH POSSIBLE ROTATOR CUFF REPAIR,SAD  When is this surgery scheduled? TBD  What type of clearance is required (medical clearance vs. Pharmacy clearance to hold med vs. Both)? MEDICAL  Are there any medications that need to be held prior to surgery and how long? ASPIRIN 81 MG ,PLAVIX 75 MG  Practice name and name of physician performing surgery? [facility] ; DR M. LUCAS  What is the office phone number? [phone]   7.   What is the office fax number? [place]   Anesthesia type (None, local, MAC, general) ? GENERAL C-BLOCK   Leonard Donovan 03/08/2021, 10:31 AM  _________________________________________________________________   (provider comments below)

## 2021-03-09 NOTE — Telephone Encounter (Signed)
   Name: Leonard Donovan  DOB: [birth year]  MRN: [id number]   Primary Cardiologist: [name], MD  Chart reviewed as part of pre-operative protocol coverage. Patient was contacted 03/09/2021 in reference to pre-operative risk assessment for pending surgery as outlined below.  Leonard Donovan was last seen on 02/16/2021 by Dr. Jens Som.  Since that day, Leonard Donovan has done well without any exertional chest pain or worsening dyspnea.  Therefore, based on ACC/AHA guidelines, the patient would be at acceptable risk for the planned procedure without further cardiovascular testing.   Per Dr. Jens Som, "Ok to hold plavix but would continue ASA".  He may hold Plavix for 5 days but continue on aspirin. He will need to restart Plavix as soon as possible afterward at the surgeon's discretion.  The patient was advised that if he develops new symptoms prior to surgery to contact our office to arrange for a follow-up visit, and he verbalized understanding.  I will route this recommendation to the requesting party via Epic fax function and remove from pre-op pool. Please call with questions.  Leonard Donovan, [place] 03/09/2021, 4:33 PM

## 2021-03-11 NOTE — Progress Notes (Signed)
[facility]   HOME SLEEP TEST REPORT ( by Watch PAT)   STUDY DATE:  Data downloaded on 03-11-2021 DOB:  January 11, 1966    ORDERING CLINICIAN: Melvyn Novas, MD  REFERRING CLINICIAN: STROKE MD, Ihor Austin, NP    CLINICAL INFORMATION/HISTORY: Consultation visit on 01/26/2021 from Ihor Austin, NP .  Chief concern according to patient :  "My sleep changed after my stroke, I always wake between 2-3 AM".  [name] has medical history of CAD (coronary artery disease) and of bypass graft, Glaucoma, H/O: CVA (cerebrovascular accident) (2022), Hyperlipidemia LDL goal <70, and Hypertension.  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 He had speech and language therapy, had some swallowing difficulties.he is currently evaluated by orthopedics and he still has left-sided arm and hand weakness and some shoulder pain for which she has worked with OT.   The patient goes to bed at 10 PM and continues to sleep until 2 AM-  he wakes for 2 bathroom breaks, the first time at 2 AM.  Between 5-6 AM is the usual rise time. He reports not feeling refreshed or restored in AM, with symptoms such as dry mouth, but no morning headaches. The preferred sleep position is prone , but is now ( after bypass) sleeping on his side - no longer snoring after his quadruple bypass. He takes frequent naps. He is excessively daytime sleepy.     Epworth sleepiness score: 19/24.   BMI: 33.6 kg/m   Neck Circumference: 18"   FINDINGS:   Sleep Summary:   Total Recording Time (hours, min): Total recording time of this home sleep test amounted to 8 hours and 44 minutes of which 7 hours and 48 minutes were calculated to reflect the total sleep time.  REM sleep was present for 21.7% of sleep time.                                       Respiratory Indices:   Calculated pAHI (per hour): The overall apnea hypopnea index was 53.2/h . In REM sleep, the AHI  was exacerbated to 78.7%, versus 46.3/h in non-REM sleep.                                             Positional AHI: In supine sleep the AHI amounted to 52.5/h and in nonsupine sleep to 56/h.  The patient only slept in the left lateral position as a nonsupine position.  Snoring level was moderately loud with a mean volume of 41 dB.  Snoring was present for 24% of the total sleep time, the equivalent of 111 minutes.  Snoring reached at times a volume of over 50 dB.                                                 Oxygen Saturation Statistics:    O2 Saturation Range (%): Oxygen saturation varied between a nadir of 75% and a maximum saturation of 97% with a mean oxygenation of 91%.  O2 Saturation (minutes) <89%: Hypoxia was present for 5.8 minutes the equivalent of 1.2% of total sleep time.         Pulse Rate Statistics:      Pulse Range: Heart rate varied between 40 bpm and 82 bpm with a mean heart rate of only 53 bpm.  Please note that this home sleep test can only provide heart rate data but not cardiac rhythm data.               IMPRESSION:  This HST confirms the presence of severe obstructive sleep apnea with a strong REM sleep component and apparently not sleep position dependent.  Snoring was also present. This type of apnea requires positive airway pressure therapy which should be initiated immediately.   RECOMMENDATION: An auto titration capable CPAP device will be ordered with a setting between 6 and 18 cm water pressure, 3 cm EPR, heated humidification and an interface that should be chosen to the patient's comfort.  Please fit the patient in person and allow for supine position during the fitting.    INTERPRETING PHYSICIAN:   Melvyn Novas, MD   Medical Director of [facility].

## 2021-03-12 DIAGNOSIS — U099 Post covid-19 condition, unspecified: Secondary | ICD-10-CM | POA: Insufficient documentation

## 2021-03-12 DIAGNOSIS — R0683 Snoring: Secondary | ICD-10-CM | POA: Insufficient documentation

## 2021-03-12 DIAGNOSIS — I639 Cerebral infarction, unspecified: Secondary | ICD-10-CM | POA: Insufficient documentation

## 2021-03-12 DIAGNOSIS — G4719 Other hypersomnia: Secondary | ICD-10-CM | POA: Insufficient documentation

## 2021-03-12 DIAGNOSIS — I69354 Hemiplegia and hemiparesis following cerebral infarction affecting left non-dominant side: Secondary | ICD-10-CM | POA: Insufficient documentation

## 2021-03-12 DIAGNOSIS — F0789 Other personality and behavioral disorders due to known physiological condition: Secondary | ICD-10-CM | POA: Insufficient documentation

## 2021-03-12 DIAGNOSIS — R29818 Other symptoms and signs involving the nervous system: Secondary | ICD-10-CM | POA: Insufficient documentation

## 2021-03-12 DIAGNOSIS — G4733 Obstructive sleep apnea (adult) (pediatric): Secondary | ICD-10-CM | POA: Insufficient documentation

## 2021-03-12 DIAGNOSIS — G9332 Myalgic encephalomyelitis/chronic fatigue syndrome: Secondary | ICD-10-CM | POA: Insufficient documentation

## 2021-03-12 NOTE — Procedures (Signed)
[facility]   HOME SLEEP TEST REPORT ( by Watch PAT)   STUDY DATE:  Data downloaded on 03-11-2021 DOB:  [birth year]    ORDERING CLINICIAN: Melvyn Novas, MD  REFERRING CLINICIAN: STROKE MD, Ihor Austin, NP    CLINICAL INFORMATION/HISTORY: Consultation visit on 01/26/2021 from Ihor Austin, NP .  Chief concern according to patient :  "My sleep changed after my stroke, I always wake between 2-3 AM".  Leonard Donovan has medical history of CAD (coronary artery disease) and of bypass graft, Glaucoma, H/O: CVA (cerebrovascular accident) (2022), Hyperlipidemia LDL goal <70, and Hypertension.  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 He had speech and language therapy, had some swallowing difficulties.he is currently evaluated by orthopedics and he still has left-sided arm and hand weakness and some shoulder pain for which she has worked with OT. He had contracted COVID which also resulted in more fatigue   The patient goes to bed at 10 PM and continues to sleep until 2 AM-  he wakes for 2 bathroom breaks, the first time at 2 AM.  Between 5-6 AM is the usual rise time. He reports not feeling refreshed or restored in AM, with symptoms such as dry mouth, but no morning headaches. The preferred sleep position is prone , but is now ( after bypass) sleeping on his side - no longer snoring after his quadruple bypass. He takes frequent naps. He is excessively daytime sleepy.     Epworth sleepiness score: 19/24.   BMI: 33.6 kg/m   Neck Circumference: 18"   FINDINGS:   Sleep Summary:   Total Recording Time (hours, min): Total recording time of this home sleep test amounted to 8 hours and 44 minutes of which 7 hours and 48 minutes were calculated to reflect the total sleep time.  REM sleep was present for 21.7% of sleep time.                                       Respiratory Indices:   Calculated pAHI (per hour): The overall  apnea hypopnea index was 53.2/h . In REM sleep, the AHI was exacerbated to 78.7%, versus 46.3/h in non-REM sleep.                                             Positional AHI: In supine sleep the AHI amounted to 52.5/h and in nonsupine sleep to 56/h.  The patient only slept in the left lateral position as a nonsupine position.  Snoring level was moderately loud with a mean volume of 41 dB.  Snoring was present for 24% of the total sleep time, the equivalent of 111 minutes.  Snoring reached at times a volume of over 50 dB.                                                 Oxygen Saturation Statistics:    O2 Saturation Range (%): Oxygen saturation varied between a nadir of 75% and a maximum saturation of 97% with a mean oxygenation of 91%.  O2 Saturation (minutes) <89%: Hypoxia was present for 5.8 minutes the equivalent of 1.2% of total sleep time.         Pulse Rate Statistics:      Pulse Range: Heart rate varied between 40 bpm and 82 bpm with a mean heart rate of only 53 bpm.  Please note that this home sleep test can only provide heart rate data but not cardiac rhythm data.               IMPRESSION:  This HST confirms the presence of severe obstructive sleep apnea with a strong REM sleep component and apparently not sleep position dependent.  Snoring was also present. This type of apnea requires positive airway pressure therapy which should be initiated immediately.   RECOMMENDATION: An auto titration capable CPAP device will be ordered with a setting between 6 and 18 cm water pressure, 3 cm EPR, heated humidification and an interface that should be chosen to the patient's comfort.  Please fit the patient in person and allow for supine position during the fitting.    INTERPRETING PHYSICIAN:   Melvyn Novas, MD   Medical Director of [facility].

## 2021-03-12 NOTE — Addendum Note (Signed)
Addended by: Melvyn Novas on: 03/12/2021 03:24 PM   Modules accepted: Orders

## 2021-03-12 NOTE — Progress Notes (Signed)
PCP is not listed on EPIC:  IMPRESSION:  This HST confirms the presence of severe obstructive sleep apnea with a strong REM sleep component and apparently not sleep position dependent.  Snoring was also present. This type of apnea requires positive airway pressure therapy which should be initiated immediately.  RECOMMENDATION: An auto titration capable CPAP device will be ordered with a setting between 6 and 18 cm water pressure, 3 cm EPR, heated humidification and an interface that should be chosen to the patient's comfort.  Please fit the patient in person and allow for supine position during the fitting.

## 2021-03-15 ENCOUNTER — Telehealth: Payer: Self-pay

## 2021-03-15 NOTE — Telephone Encounter (Signed)
-----   Message from Melvyn Novas, MD sent at 03/12/2021  3:24 PM EDT ----- PCP is not listed on EPIC:  IMPRESSION:  This HST confirms the presence of severe obstructive sleep apnea with a strong REM sleep component and apparently not sleep position dependent.  Snoring was also present. This type of apnea requires positive airway pressure therapy which should be initiated immediately.  RECOMMENDATION: An auto titration capable CPAP device will be ordered with a setting between 6 and 18 cm water pressure, 3 cm EPR, heated humidification and an interface that should be chosen to the patient's comfort.  Please fit the patient in person and allow for supine position during the fitting.

## 2021-03-15 NOTE — Telephone Encounter (Signed)
Thank you for update. If he has already been off Nuedexta for the past 3 days, okay to remain off if he wishes. Will be important to monitor for worsening pseudobulbar affect symptoms for which this may need to be restarted.

## 2021-03-15 NOTE — Telephone Encounter (Signed)
I called patient's wife, Albin Felling, per [name]. I discussed this with her. She will let us know if the PBA symptoms worsen.

## 2021-03-15 NOTE — Telephone Encounter (Signed)
I called pt, spoke with patient's wife, Albin Felling, per [name]. I advised pt's wife that Dr. Vickey Huger reviewed their sleep study results and found that pt has severe osa. Dr. Vickey Huger recommends that pt start an auto pap at home. I reviewed PAP compliance expectations with the pt's wife. Pt's wife is agreeable to pt starting an auto-PAP. I advised pt's wife that an order will be sent to a DME, [facility], and [facility] will call the pt's wife within about one week after they file with the pt's insurance. [facility] will show the pt how to use the machine, fit for masks, and troubleshoot the auto-PAP if needed. A follow up appt was made for insurance purposes with Shanda Bumps, NP  on 07/07/21 at 2:45pm. Pt verbalized understanding to arrive 15 minutes early and bring their auto-PAP. A letter with all of this information in it will be mailed to the pt as a reminder. Pt's wife verbalized understanding of results. I have sent the order to [facility] and have received confirmation that they have received the order.  Of note, patient's wife reports that he stopped nuedexta 3 days ago. He reported that it made him feel depressed and lethargic. She wanted Shanda Bumps, NP to be aware of this in case he needed to taper off of it. Since he has already been off of it for 3 days, I advised patient's wife that I would let Shanda Bumps, NP know and if there are any further instructions about stopping nuedexta that I would call her back.

## 2021-03-30 ENCOUNTER — Telehealth: Payer: Self-pay | Admitting: *Deleted

## 2021-03-30 NOTE — Telephone Encounter (Signed)
STD papers signed, sent to medical records for processing.  °

## 2021-03-30 NOTE — Telephone Encounter (Signed)
Received STD papers, filled out and placed on Jessica' NP's desk for review, signature.

## 2021-04-04 ENCOUNTER — Other Ambulatory Visit: Payer: Self-pay | Admitting: General Practice

## 2021-04-12 HISTORY — PX: SHOULDER ARTHROSCOPY: SHX128

## 2021-05-05 ENCOUNTER — Telehealth: Payer: Self-pay

## 2021-05-05 NOTE — Telephone Encounter (Signed)
Short term disability paper work has been completed and fwd back to medical records for processing.

## 2021-05-06 ENCOUNTER — Telehealth: Payer: Self-pay | Admitting: *Deleted

## 2021-05-06 NOTE — Telephone Encounter (Signed)
Pt form ready @ front desk for p/u

## 2021-05-13 ENCOUNTER — Encounter: Payer: Self-pay | Admitting: Cardiology

## 2021-05-18 ENCOUNTER — Telehealth: Payer: Self-pay

## 2021-05-18 NOTE — Telephone Encounter (Signed)
Signed and placed in outbox.  Thank you. ?

## 2021-05-18 NOTE — Telephone Encounter (Signed)
Form received, placed in medical records for debra to pick up.

## 2021-05-18 NOTE — Telephone Encounter (Signed)
Form completed, placed on Leonard Donovan desk for review and signature.

## 2021-06-07 ENCOUNTER — Telehealth: Payer: Self-pay

## 2021-06-07 ENCOUNTER — Encounter: Payer: Self-pay | Admitting: Neurology

## 2021-06-07 NOTE — Telephone Encounter (Signed)
Hilda Blades, I have been completing Mr. Altons disability paperwork for quite some time - he has not been seen by MD in office since hospital discharge. Apparently, disability company is now requesting MD sign off on disability. Can we ensure this is accurate? If so, he may need to schedule a follow up visit with Dr. Leonie Man but I can clarify this if needed. Thank you.

## 2021-06-07 NOTE — Telephone Encounter (Signed)
Do you know why this is all of a sudden? Will Dr. Leonie Man need to sign on going or just this one time (he needs paperwork monthly)? If this is needed ongoing, he will need to continue to follow with Dr. Leonie Man to completion ongoing.

## 2021-06-07 NOTE — Telephone Encounter (Signed)
Forms received, completed and placed on Jessica's desk for  MD review and signature.  (Will Dr Pearlean Brownie sign this? Please advise )

## 2021-06-08 NOTE — Progress Notes (Signed)
[facility] [address] [place]. [place] [address] [phone]       OFFICE FOLLOW UP NOTE  [name] Date of Birth:  May 21, 1966 Medical Record Number:  [id number]   Reason for visit: initial CPAP f/u and stroke f/u    SUBJECTIVE:   CHIEF COMPLAINT:  Chief Complaint  Patient presents with   Obstructive Sleep Apnea    Rm 3 with spouse Leonard Donovan Pt is well, states he is having issues with CPAP, feels like he cant breath and having leaks       HPI:    Update 06/09/2021 [id number] returns for initial CPAP compliance visit and stroke follow up. Previously seen 4 months ago for stroke f/u. Completed HST 02/24/2021 which showed severe OSA with strong REM sleep component with total AHI 53.2/h and recommended initiating AutoPap which was started on 03/25/2021.  Compliance report from 05/09/2021 -06/07/2021 shows 24 out of 30 usage days for 80% compliance with 20 days greater than 4 hours per 67% compliance.  Average usage 5 hours and 18 minutes.  Residual AHI 10.7.  Leaks in the 95th percentile 5.8 (max 41.9).  Pressure in the 95th percentile 12.6 with mean pressure 6 and max pressure 18 and EPR level 3. Has difficulty tolerating pressure at times as he feels he cannot breathe and also c/o leaks around his mask. Does notice some improvement of daytime fatigue and sleeping better at night.  Otherwise tolerating mask well.  Current use of full facemask.  Epworth Sleepiness Scale 9.  Stable from stroke standpoint - residual mild LUE weakness, cognitive impairment and gait impairment with imbalance stable. Does note some improvement of LUE strength especially left hand since s/p left rotator cuff repair and procedure for adhesive capsulitis and subacromial decompression of left shoulder on 04/12/2021 by Dr. Rojelio Donovan. Working with PT with gradual improvement. Gradually trying to drive - still having occultly with inattention. Recurrent falls thankfully without severe injury or  hitting head - usually on uneven ground (walking in the woods). Tried Nuedexta for suspected PBA - felt this caused worsening depression. Still occasionally laughing at inappropriate times but more so seems to be heightened emotions. Remains on disability - in the process of applying for Social Security disability.  Denies new stroke/TIA symptoms.  Compliant on aspirin, Plavix, atorvastatin and Zetia without side effects.  Blood pressure today 155/95.  Routinely follows with PCP and cardiology.  No new concerns at this time.    History provided for reference purposes only Update 02/16/2021 JM: [name] returns for 61-monthstroke follow-up accompanied by his wife, CAngela Donovan  Overall doing well.  Completed SLP 8/16 as he met all goals and only rare word finding difficulty and occasional deviations in attention and reduced awareness.  Left-sided weakness with some improvement since prior visit.  Followed by orthopedics for adhesive capsulitis and currently working with PT noting some improvement. Continued occassional imbalance and occasional swallowing difficulties but seems to be more related to inattention.  He has not previously worked with PT for imbalance as he was previously working with PT for cardiac rehab. Per wife, continued uncontrolled crying and occasional uncontrolled laughing -previously discussed Nuedexta for PBA -declined interest at that time but now interested in trialing.  Remains on disability previously working in [employer]  Denies new stroke/TIA symptoms.  Remains on aspirin and Plavix as well as atorvastatin and zetia without side effects.  Blood pressure today 155/98. Routinely monitors at home - cardiology recently increased BP meds last week.  Evaluated by Dr. Brett Donovan for concern of possible sleep apnea and plans on completing HST tonight.  No further concerns at this time  Update 11/24/2020 JM: [name] returns for 48-monthstroke follow-up accompanied by his wife.  Patient  reports residual left-sided weakness/pain, occasional imbalance and fatigue but overall improving.  Currently working with OT for residual left-sided weakness and shoulder pain -currently being followed by orthopedics with MRI completed yesterday and has scheduled visit next week for further evaluation due to continued shoulder pain interfering with therapy sessions as well as sleep.  Also continues to work with SLP for aphasia, dysarthria and cognitive communication deficit.  Completed MBS 4/5 which was normal although he does have occasional difficulty swallowing water.  Denies new stroke/TIA symptoms. Remains on aspirin and Plavix as well as atorvastatin and zetia without associated side effects.  Blood pressure today 125/83. Completed 30-day cardiac event monitor which was negative for atrial fibrillation. He does report daytime fatigue as well as insomnia -has not previously underwent sleep study.  Wife is concerned regarding labile emotions where he may laugh or cry uncontrollably during situations that may not be that funny or sad.  No further concerns at this time.  Initial visit 08/24/2020 JM: [name] being seen for hospital follow-up accompanied by his wife  Reports residual left-sided weakness, left facial droop, swallowing difficulties and cognitive difficulties Evaluated by [place] SLP 3/7 - noted mild cognitive linguistic deficits; no concerns of aspiration -personally reviewed evaluation note -no further SLP recommended as patient and wife reported that he was at baseline At todays visit, wife reports occasional confusion and delayed processing which patient agrees with Report of swallowing difficulties only with water otherwise denies difficulty Denies new stroke/TIA symptoms He has not returned back to work working in c[employer]ropod[employer]due to recent MI  Compliant on aspirin and Plavix -denies associated side effects Compliant on atorvastatin 80 mg daily -denies  associated side effects Blood pressure today 186/112 -shortly after discharge, evaluated by cardiology for syncopal episode which was felt likely due to orthostatic hypotension - cards d/c'd carvedilol and losartan and has since been slowly restarting BP regimen. Has cards f/u on Wednesday Currently wearing cardiac monitor which will be completed on 4/3  No further concerns at this time  Stroke admission 07/20/2020 Mr. AHARUKI ARNOLDis a 56y.o. male with history of hypertension, coronary artery disease (s/p quadruple CABG on 07/08/2020), glaucoma, COVID-19 infection (sx on 06/07/2020, dx on 06/18/2020),  who presented on 07/20/2020 with left sided facial droop, slurred speech, and left upper extremity weakness.   Personally reviewed hospitalization pertinent progress notes, lab work and imaging with summary provided.  Evaluated by Dr. XErlinda Hongwith stroke work-up revealing multifocal infarcts, largest at right pontine, likely related to recent cardiac surgery however other cardioembolic source cannot be ruled out.  Recommended 30-day cardiac event monitor outpatient to rule out A. fib.  On DAPT PTA and recommended continuation at discharge per cardiology recommendations s/p CABG 07/08/2020.  LDL 49 on atorvastatin 80 mg daily.  Other stroke risk factors include former tobacco use, obesity and suspected OSA.  Residual deficit of mild left facial droop, left hemiparesis and moderate cognitive deficits.  Stroke:  Multifocal infarcts, largest at right pontine, likely related to recent cardiac surgery. However, other cardioembolic source can not rule out Code Stroke CT head: Small focus of hypodensity in the right white matter. CTA head & neck: no intracranial arterial occlusion or high-grade stenosis. Bilateral carotid bifurcation atherosclerosis,  right more than left MRI Acute right pontine infarct in addition to multiple small acute to early subacute infarcts within the bilateral frontal and parietal white matter,  consistent with a central (cardiac or aortic) embolic source. No hemorrhage or mass effect. Recommend 30 day monitor to evaluate for atrial fibrillation as the source for stroke 2D Echo: EF 60 to 65% LDL 49 HgbA1c 5.6 VTE prophylaxis - Lovenox 74m daily Swallow eval: passed for heart healthy diet aspirin 81 mg daily and Plavix prior to admission, continue aspirin 81 mg daily and clopidogrel 75 mg daily DAPT on discharge. Therapy recommendations:  outpt SLP Disposition: home         ROS:   14 system review of systems performed and negative with exception of those listed in HPI  PMH:  Past Medical History:  Diagnosis Date   CAD (coronary artery disease) of bypass graft    CABG x 4   Glaucoma    H/O: CVA (cerebrovascular accident) 2022   Hyperlipidemia LDL goal <70    Hypertension     PSH:  Past Surgical History:  Procedure Laterality Date   CORONARY ARTERY BYPASS GRAFT N/A 07/08/2020   Procedure: CORONARY ARTERY BYPASS GRAFTING (CABG), ON PUMP, TIMES FOUR, LEFT INTERNAL MAMMARY ARTERY TO LAD, RIGHT SVG TO PDA, DISTAL CIRCUMFLEX, AND OM1;  Surgeon: GGrace Isaac MD;  [facility]  Service: Open Heart Surgery;  Laterality: N/A;   ENDOVEIN HARVEST OF GREATER SAPHENOUS VEIN Right 07/08/2020   Procedure: ENDOVEIN HARVEST OF RIGHT GREATER SAPHENOUS VEIN;  Surgeon: GGrace Isaac MD;  [facility]  Service: Open Heart Surgery;  Laterality: Right;   LEFT HEART CATH AND CORONARY ANGIOGRAPHY N/A 07/02/2020   Procedure: LEFT HEART CATH AND CORONARY ANGIOGRAPHY;  Surgeon: VJettie Booze MD;  [facility];  Service: Cardiovascular;  Laterality: N/A;   NO PAST SURGERIES     SHOULDER ARTHROSCOPY  04/12/2021   TEE WITHOUT CARDIOVERSION N/A 07/08/2020   Procedure: TRANSESOPHAGEAL ECHOCARDIOGRAM (TEE);  Surgeon: GGrace Isaac MD;  [facility]  Service: Open Heart Surgery;  Laterality: N/A;    Social History:  Social History    Socioeconomic History   Marital status: Married    Spouse name: CDesigner, jewellery  Number of children: Not on file   Years of education: Not on file   Highest education level: Not on file  Occupational History   Not on file  Tobacco Use   Smoking status: Former   Smokeless tobacco: Former  VScientific laboratory technicianUse: Never used  Substance and Sexual Activity   Alcohol use: Not Currently   Drug use: Not Currently   Sexual activity: Not on file  Other Topics Concern   Not on file  Social History Narrative   Lives with wife   Social Determinants of Health   Financial Resource Strain: Not on file  Food Insecurity: Not on file  Transportation Needs: Not on file  Physical Activity: Not on file  Stress: Not on file  Social Connections: Not on file  Intimate Partner Violence: Not on file    Family History:  Family History  Problem Relation Age of Onset   Hypertension Father    CAD Sister        diagnosed in her 528s  Heart attack Sister     Medications:   Current Outpatient Medications on File Prior to Visit  Medication Sig Dispense Refill   acetaminophen (TYLENOL) 500 MG tablet Take  2 tablets (1,000 mg total) by mouth every 8 (eight) hours as needed for mild pain (or discomfort). 30 tablet 0   amLODipine (NORVASC) 10 MG tablet Take 1 tablet (10 mg total) by mouth daily. 180 tablet 3   aspirin EC 81 MG EC tablet Take 1 tablet (81 mg total) by mouth daily. Swallow whole. 30 tablet 11   atorvastatin (LIPITOR) 80 MG tablet Take 1 tablet (80 mg total) by mouth daily. 90 tablet 3   carvedilol (COREG) 12.5 MG tablet Take 1 tablet (12.5 mg total) by mouth 2 (two) times daily. 180 tablet 3   clopidogrel (PLAVIX) 75 MG tablet Take 1 tablet (75 mg total) by mouth daily. 90 tablet 3   Dextromethorphan-quiNIDine (NUEDEXTA) 20-10 MG capsule Take 1 capsule by mouth 2 (two) times daily. 60 capsule 5   ezetimibe (ZETIA) 10 MG tablet Take 1 tablet (10 mg total) by mouth daily. 90 tablet 3    Multiple Vitamins-Minerals (MULTIVITAMIN WITH MINERALS) tablet Take 1 tablet by mouth daily.     nitroGLYCERIN (NITROSTAT) 0.4 MG SL tablet Place 1 tablet (0.4 mg total) under the tongue every 5 (five) minutes as needed. 25 tablet 3   valsartan (DIOVAN) 160 MG tablet TAKE 1 TABLET BY MOUTH EVERY DAY 90 tablet 1   No current facility-administered medications on file prior to visit.    Allergies:  No Known Allergies    OBJECTIVE:  Physical Exam  Vitals:   06/09/21 1544  BP: (!) 155/95  Pulse: 70  Weight: 242 lb (109.8 kg)  Height: 5' 9" (1.753 Leonard)   Body mass index is 35.74 kg/Leonard. No results found.  General: well developed, well nourished, very pleasant middle-age Caucasian male, seated, in no evident distress Head: head normocephalic and atraumatic.   Neck: supple with no carotid or supraclavicular bruits Cardiovascular: regular rate and rhythm, no murmurs Musculoskeletal: no deformity Skin:  no rash/petichiae Vascular:  Normal pulses all extremities   Neurologic Exam Mental Status: Awake and fully alert. Occasional word finding difficulty.  No evidence of dysarthria.  Oriented to place and time. Recent and remote memory intact. Attention span, concentration and fund of knowledge fluctuated during visit. Mood and affect appropriate.  Recall 3/3. 4 legged animal naming 9 in 60 seconds. Struggles with serial addition. Cranial Nerves: Pupils equal, briskly reactive to light. Extraocular movements full without nystagmus. Visual fields full to confrontation. Hearing intact. Facial sensation intact.  Slight left lower facial weakness when smiling.  Tongue and palate moves normally and symmetrically.  Motor: Normal bulk and tone and strength right upper and lower extremity LUE: 5/5 deltoid and elbow extension; 4+/5 elbow flexion and handgrip.  Limited L shoulder ROM and decreased finger dexterity but improved compared to prior visit LLE: 5/5 Sensory.: intact to touch , pinprick ,  position and vibratory sensation.  Coordination: Rapid alternating movements normal in all extremities except slightly decreased left hand. Finger-to-nose and heel-to-shin performed accurately bilaterally.  Orbits right arm over left arm. Gait and Station: Arises from chair without difficulty. Stance is normal. Gait demonstrates normal stride length with mild imbalance without use of assistive device. Tandem walk and heel toe with mild difficulty.  Romberg positive. Reflexes: 1+ and symmetric. Toes downgoing.        ASSESSMENT: GERLAD PELZEL is a 56 y.o. year old male presented with left facial droop, slurred speech and LUE weakness on 07/20/2020 with stroke work-up revealing multifocal infarcts, largest at right pontine, likely related to recent cardiac surgery (s/p quadruple  CABG 2/9) although other cardioembolic sources cannot be ruled out.  Vascular risk factors include HTN, HLD, CAD s/p CABG 06/2020, COVID-19 infection 05/2020, former tobacco use and severe OSA now on CPAP. S/p left shoulder rotator cuff repair, manipulation for adhesive capsulitis and subacromial decompression 04/12/2021 by Dr. Linton Rump     PLAN:  Multifocal infarcts:  Residual deficit: mild LUE weakness with decreased hand dexterity, cognitive impairment (high level executive functioning and inattention), left facial weakness and gait impairment with imbalance.   Discussed use of AD (such as walking stick) when on uneven ground for fall prevention.  Emotional lability: declines interest in medication management. Discussed psychotherapy but declines interest at this time. Pseudobulbar affect: mild with only occasional inappropriate laughing.  Intolerant to Nuedexta.  Manageable on nonpharmacological management at this time No significant improvements since prior visit in regards to potential return to work. As he is almost 1 year post stroke, unlikely for further recovery. Continue disability and continue applying for  SS[employer] Cardiac event monitor negative for atrial fibrillation Continue aspirin 81 mg daily  and atorvastatin for secondary stroke prevention.   Discussed secondary stroke prevention measures and importance of close PCP follow up for aggressive stroke risk factor management  HTN: BP goal <130/90.  Well-controlled on current regimen per PCP/cardiology HLD: LDL goal <70. On atorvastatin 80 mg daily and Zetia per cards. Prior lipids stable  Severe OSA: now on CPAP.  Difficulty tolerating high pressure with c/o mask leaks. Will try to decrease max pressure from 18 to 16. Continue min pressure at 6. Will plan on repeat compliance review in 1 month. Adjusted settings via ResMed.  Discussed importance of increasing nightly usage greater than 4 hours per night.  Continue to follow with DME company for any needed supplies or CPAP related concerns     Follow up in 6 months or call earlier if needed   CC:  PCP: Gara Kroner, DO    I spent 43 minutes of face-to-face and non-face-to-face time with patient and wife.  This included previsit chart review, lab review, study review, order entry, electronic health record documentation, and patient and wife education and discussion regarding prior stroke with residual deficits, secondary stroke prevention measures and aggressive stroke risk factor management, diagnosis of severe OSA with review and discussion of compliance report and intolerance concerns and answered all other questions to patient and wife's satisfaction  Frann Rider, AGNP-BC  [facility] [address] [place], [place] [address]  Phone [phone] Fax [phone] Note: This document was prepared with digital dictation and possible smart phrase technology. Any transcriptional errors that result from this process are unintentional.

## 2021-06-09 ENCOUNTER — Ambulatory Visit: Payer: BC Managed Care – PPO | Admitting: Adult Health

## 2021-06-09 ENCOUNTER — Encounter: Payer: Self-pay | Admitting: Adult Health

## 2021-06-09 VITALS — BP 155/95 | HR 70 | Ht 69.0 in | Wt 242.0 lb

## 2021-06-09 DIAGNOSIS — I639 Cerebral infarction, unspecified: Secondary | ICD-10-CM

## 2021-06-09 DIAGNOSIS — G4733 Obstructive sleep apnea (adult) (pediatric): Secondary | ICD-10-CM

## 2021-06-10 ENCOUNTER — Telehealth: Payer: Self-pay | Admitting: *Deleted

## 2021-06-10 NOTE — Telephone Encounter (Signed)
*  POD2  Form placed in POD 2 for Dr Pearlean Brownie review.

## 2021-06-10 NOTE — Telephone Encounter (Signed)
Pt  disability form @ front desk for p/u.

## 2021-06-10 NOTE — Telephone Encounter (Signed)
Needs to be signed by Dr. Pearlean Brownie for this particular form. Continued monthly forms do not need MD signature per patient/wife. Can provide to POD 1 to complete. Thank you

## 2021-06-10 NOTE — Telephone Encounter (Signed)
Dr. Pearlean Brownie signed the form. It has been sent back to medical records.

## 2021-06-14 ENCOUNTER — Ambulatory Visit: Payer: BC Managed Care – PPO | Admitting: Adult Health

## 2021-06-29 ENCOUNTER — Telehealth: Payer: Self-pay | Admitting: *Deleted

## 2021-06-29 NOTE — Telephone Encounter (Signed)
STD paper sent to medical records for processing.

## 2021-06-29 NOTE — Telephone Encounter (Signed)
Signed and placed in outbox.  Thank you. ?

## 2021-06-29 NOTE — Telephone Encounter (Signed)
Received STD papers for month on Feb. On NP's desk for review, completion, signature.

## 2021-07-07 ENCOUNTER — Ambulatory Visit: Payer: BC Managed Care – PPO | Admitting: Adult Health

## 2021-07-07 NOTE — Telephone Encounter (Signed)
Called and spoke with patients wife, advised that STD paperwork was ready for pick up. Advised her of office hours and placed at check in.

## 2021-07-08 ENCOUNTER — Telehealth: Payer: Self-pay | Admitting: *Deleted

## 2021-07-08 NOTE — Telephone Encounter (Signed)
Received STD papers, placed on NP desk for review, signature.

## 2021-07-08 NOTE — Telephone Encounter (Signed)
signed and placed in out box. Thank you.

## 2021-07-12 NOTE — Telephone Encounter (Signed)
Forms given to medical records for processing.  

## 2021-07-13 ENCOUNTER — Encounter: Payer: Self-pay | Admitting: Neurology

## 2021-07-19 ENCOUNTER — Telehealth: Payer: Self-pay | Admitting: Adult Health

## 2021-07-19 ENCOUNTER — Telehealth: Payer: Self-pay

## 2021-07-19 DIAGNOSIS — G4733 Obstructive sleep apnea (adult) (pediatric): Secondary | ICD-10-CM

## 2021-07-19 NOTE — Telephone Encounter (Signed)
I called pt and relayed Np's message to pt's wife ( ok per dpr). She sts pt is agreeable to this recommendation. I advised once sleep lab has received approval from insurance we will be in touch to schedule.

## 2021-07-19 NOTE — Telephone Encounter (Signed)
I talked with pt's wife today ( ok per dpr). She is requesting update on 7-a form for pt's disability. She sts the form was to clear pt for a year for disability and office notes pertaining  to why the pt is on disability is needed.  I advised I would send to medical records for review on this.

## 2021-07-19 NOTE — Telephone Encounter (Signed)
Reviewed CPAP compliance report since pressure setting adjustments made at recent visit. Compliance report from 06/14/2021 -07/13/2021 shows 30 out of 30 usage days with 29 days greater than 4 hours for 97% compliance.  Average usage 7 hours and 2 minutes.  Residual AHI 11.3 on set pressure 6-16 with EPR level 3.  Pressure in the 95th percentile 12.4.  Leaks in the 90 percentile 7.0.  Compliance greatly improved since lowering pressure settings but unfortunately, residual AHI increased (central 2.9, obstructive 5.3). Recommend titration study with option to switch to BiPAP if indicated.

## 2021-07-19 NOTE — Telephone Encounter (Signed)
Done medical records faxed today. 

## 2021-07-22 ENCOUNTER — Ambulatory Visit (INDEPENDENT_AMBULATORY_CARE_PROVIDER_SITE_OTHER): Payer: BC Managed Care – PPO | Admitting: Neurology

## 2021-07-22 ENCOUNTER — Other Ambulatory Visit: Payer: Self-pay

## 2021-07-22 DIAGNOSIS — G4733 Obstructive sleep apnea (adult) (pediatric): Secondary | ICD-10-CM

## 2021-07-22 DIAGNOSIS — G4739 Other sleep apnea: Secondary | ICD-10-CM

## 2021-07-22 DIAGNOSIS — I69354 Hemiplegia and hemiparesis following cerebral infarction affecting left non-dominant side: Secondary | ICD-10-CM

## 2021-07-28 DIAGNOSIS — G4733 Obstructive sleep apnea (adult) (pediatric): Secondary | ICD-10-CM | POA: Insufficient documentation

## 2021-07-28 DIAGNOSIS — G4739 Other sleep apnea: Secondary | ICD-10-CM | POA: Insufficient documentation

## 2021-07-28 NOTE — Procedures (Signed)
PATIENT'S NAME:  Leonard Donovan, Leonard Donovan DOB:      [birth year]      MR#:    [id number]     DATE OF RECORDING: 07/22/2021 Carmelina Dane, RPSGT REFERRING.:  Frann Rider, NP Study Performed:   CPAP  Titration HISTORY:  This male stroke Patient was diagnosed by HST 02-24-2021, severe OSA- started on auto CPAP, initially 6-18 cm water, 3 cm EPR. .  Reviewed CPAP compliance report since pressure setting adjustments made at recent visit. Uses a medium size AirFit F 20 FFM at home.  Compliance report from 06/14/2021 -07/13/2021 shows 30 out of 30 usage days with 29 days greater than 4 hours for 97% compliance.  Average usage 7 hours and 2 minutes.   Residual AHI 11.3 on set pressure 6-16 with EPR level 3.  Pressure in the 95th percentile is 12.4.  Leaks in the 90 percentile 7.0. Compliance greatly improved since lowering pressure settings but unfortunately, residual AHI increased (central 2.9, obstructive 5.3). Patient remains excessively daytime sleepy.  Recommend titration study with option to switch to BiPAP if indicated.  The patient endorsed the Epworth Sleepiness Scale at 19/24 points.   The patient's weight 242 pounds with a height of 69 (inches), resulting in a BMI of 35.9 kg/m2. The patient's neck circumference measured 18 inches.  CURRENT MEDICATIONS: Tylenol, Norvasc, Aspirin, Lipitor, Coreg, Plavix, Nuedexta, Zetia, Multivitamins w/minerals, Diovan   PROCEDURE:  This is a multichannel digital polysomnogram utilizing the SomnoStar 11.2 system.  Electrodes and sensors were applied and monitored per AASM Specifications.   EEG, EOG, Chin and Limb EMG, were sampled at 200 Hz.  ECG, Snore and Nasal Pressure, Thermal Airflow, Respiratory Effort, CPAP Flow and Pressure, Oximetry was sampled at 50 Hz. Digital video and audio were recorded.      CPAP was initiated under a Medium Simplus FFM at 5 cmH20 with heated humidity per AASM split night standards and pressure was advanced to 12 cmH20 because of hypopneas,  apneas and desaturations.  At a PAP pressure of 12 cmH20, there was a reduction of the AHI to 4.8/h with some improvement of sleep apnea. The lowest AHI was seen under 10 cm water pressure, AHI was 1.7/h and all events were seen in supine.   Lights Out was at 21:58 and Lights On at 05:04. Total recording time (TRT) was 427 minutes, with a total sleep time (TST) of 367 minutes. The patient's sleep latency was 14.5 minutes. REM latency was 56.5 minutes.  The sleep efficiency was 85.9 %.    SLEEP ARCHITECTURE: WASO (Wake after sleep onset) was 43 minutes.  There were 40 minutes in Stage N1, 240 minutes Stage N2, 15.5 minutes Stage N3 and 71.5 minutes in Stage REM.  The percentage of Stage N1 was 10.9%, Stage N2 was 65.4%, Stage N3 was 4.2% and Stage R (REM sleep) was 19.5%.   RESPIRATORY ANALYSIS:  There was a total of 92 respiratory events:  3 obstructive apneas, 4 central apneas and 5 mixed apneas with a total of 12 apneas and 80 hypopneas. The patient also had 1 respiratory event related arousals (RERAs).     The total APNEA/HYPOPNEA INDEX (AHI) was 15.0 /hour and the total RESPIRATORY DISTURBANCE INDEX was 15.2 /hour. 1 event occurred in REM sleep and 91 events in NREM.  The REM AHI was 0.8 /hour versus a non-REM AHI of 18.5 /hour.   The patient spent 66.5 minutes of total sleep time in the supine position and 301 minutes in non-supine. The supine  AHI was 8.1, versus a non-supine AHI of 16.6.  OXYGEN SATURATION & C02:  The baseline 02 saturation was 95%, with the lowest being 85%. Time spent below 89% saturation equaled 3 minutes.  PERIODIC LIMB MOVEMENTS:  The patient had a total of 0 Periodic Limb Movements. The arousals were noted as: 46 were spontaneous, 0 were associated with PLMs, 15 were associated with respiratory events.  Audio and video analysis did not show any abnormal or unusual movements, behaviors, phonations or vocalizations.   Snoring was noted in supine sleep at 8 cm water  pressure. EKG was in keeping with normal sinus rhythm. The patient was fitted with a Medium sized Simplus full face mask.  DIAGNOSIS Complex Sleep Apnea is present, this explains that REM sleep is being spared from respiratory events. The beginning of the study under 5 through 8 cm water showed more obstructive events, as pressure increased so did central and mixed apneas. The patient was fitted with a Medium sized Simplus full face mask.  No significant Sleep Related Hypoxemia was recorded. No evidence of Periodic Limb Movement Disorder   PLANS/RECOMMENDATIONS: CPAP therapy compliance is defined as 4 hours or more of nightly use. The patient has an autotitration device available and the 2 most successful pressures of CPAP are covered: 10 and 12 cm water, here used without EPR. The new settings will be 5-13 cm water without EPR.   I am hopeful that the new mask -Medium sized Simplus full face mask.-can bring improvement.    A follow up appointment will be scheduled in the Sleep Clinic at [facility].   Please call [phone] with any questions.     I certify that I have reviewed the entire raw data recording prior to the issuance of this report in accordance with the Standards of Accreditation of the American Academy of Sleep Medicine (AASM)   Larey Seat, M.D. Diplomat, Tax adviser of Psychiatry and Neurology  Diplomat, Tax adviser of Sleep Medicine Market researcher, [employer] [facility] at [employer]

## 2021-07-29 ENCOUNTER — Other Ambulatory Visit: Payer: Self-pay | Admitting: Adult Health

## 2021-07-29 DIAGNOSIS — Z9989 Dependence on other enabling machines and devices: Secondary | ICD-10-CM

## 2021-07-29 DIAGNOSIS — G4731 Primary central sleep apnea: Secondary | ICD-10-CM

## 2021-07-29 NOTE — Progress Notes (Signed)
DME order placed as advised to pressure setting adjustments and mask refitting.  ? ?Per Dr. Vickey Huger from CPAP titration on 07/22/2021: ? ?DIAGNOSIS ?Complex Sleep Apnea is present, this explains that REM sleep is being spared from respiratory events. The beginning of the study under 5 through 8 cm water showed more obstructive events, as pressure increased so did central and mixed apneas. The patient was fitted with a Medium sized Simplus full face mask. ?  ?No significant Sleep Related Hypoxemia was recorded. ?No evidence of Periodic Limb Movement Disorder  ?  ?PLANS/RECOMMENDATIONS: ?CPAP therapy compliance is defined as 4 hours or more of nightly use. The patient has an autotitration device available and the 2 most successful pressures of CPAP are covered: 10 and 12 cm water, here used without EPR. The new settings will be 5-13 cm water without EPR.  ? I am hopeful that the new mask -Medium sized Simplus full face mask.-can bring improvement.  ? ? ? ? ? ?

## 2021-08-11 ENCOUNTER — Ambulatory Visit: Payer: BC Managed Care – PPO | Admitting: Cardiology

## 2021-08-11 ENCOUNTER — Encounter: Payer: Self-pay | Admitting: Cardiology

## 2021-08-12 ENCOUNTER — Telehealth: Payer: Self-pay | Admitting: *Deleted

## 2021-08-12 ENCOUNTER — Telehealth: Payer: Self-pay

## 2021-08-12 NOTE — Telephone Encounter (Signed)
STD paperwork received, completed and placed on Leonard Donovan desk for review and signature.  ?

## 2021-08-12 NOTE — Telephone Encounter (Signed)
Received colonoscopy clearance letter from [facility]. Scheduled for 08/19/21. Placed on NP desk for completion.  ?

## 2021-08-13 ENCOUNTER — Telehealth: Payer: Self-pay | Admitting: *Deleted

## 2021-08-13 ENCOUNTER — Telehealth: Payer: Self-pay

## 2021-08-13 NOTE — Telephone Encounter (Signed)
Preoperative team, please contact this patient and set up a phone call appointment for further cardiac evaluation.  Thank you for your help. ? ?Thomasene Ripple. Emmamarie Kluender NP-C ? ?  ?08/13/2021, 10:44 AM ?[facility] ?[address] ?Office [phone] Fax [phone] ? ?

## 2021-08-13 NOTE — Telephone Encounter (Signed)
Preoperative team, he may hold his Plavix 5 days prior to his procedure.  He will still need preoperative cardiac evaluation via telephone visit.  Thank you. ? ?Thomasene Ripple. Britt Petroni NP-C ? ?  ?08/13/2021, 11:16 AM ? [facility] ?[address] ?Office [phone] Fax [phone] ? ?

## 2021-08-13 NOTE — Telephone Encounter (Signed)
?  Patient Consent for Virtual Visit  ? ? ?   ? ?Leonard Donovan has provided verbal consent on 08/13/2021 for a virtual visit (video or telephone). ? ? ?CONSENT FOR VIRTUAL VISIT FOR:  [place]  ?By participating in this virtual visit I agree to the following: ? ?I hereby voluntarily request, consent and authorize [facility] and its employed or contracted physicians, physician assistants, nurse practitioners or other licensed health care professionals (the Practitioner), to provide me with telemedicine health care services (the ?Services") as deemed necessary by the treating Practitioner. I acknowledge and consent to receive the Services by the Practitioner via telemedicine. I understand that the telemedicine visit will involve communicating with the Practitioner through live audiovisual communication technology and the disclosure of certain medical information by electronic transmission. I acknowledge that I have been given the opportunity to request an in-person assessment or other available alternative prior to the telemedicine visit and am voluntarily participating in the telemedicine visit. ? ?I understand that I have the right to withhold or withdraw my consent to the use of telemedicine in the course of my care at any time, without affecting my right to future care or treatment, and that the Practitioner or I may terminate the telemedicine visit at any time. I understand that I have the right to inspect all information obtained and/or recorded in the course of the telemedicine visit and may receive copies of available information for a reasonable fee.  I understand that some of the potential risks of receiving the Services via telemedicine include:  ?Delay or interruption in medical evaluation due to technological equipment failure or disruption; ?Information transmitted may not be sufficient (e.g. poor resolution of images) to allow for appropriate medical decision making by the Practitioner; and/or   ?In rare instances, security protocols could fail, causing a breach of personal health information. ? ?Furthermore, I acknowledge that it is my responsibility to provide information about my medical history, conditions and care that is complete and accurate to the best of my ability. I acknowledge that Practitioner's advice, recommendations, and/or decision may be based on factors not within their control, such as incomplete or inaccurate data provided by me or distortions of diagnostic images or specimens that may result from electronic transmissions. I understand that the practice of medicine is not an exact science and that Practitioner makes no warranties or guarantees regarding treatment outcomes. I acknowledge that a copy of this consent can be made available to me via my patient portal ([facility]), or I can request a printed copy by calling the office of [facility].   ? ?I understand that my insurance will be billed for this visit.  ? ?I have read or had this consent read to me. ?I understand the contents of this consent, which adequately explains the benefits and risks of the Services being provided via telemedicine.  ?I have been provided ample opportunity to ask questions regarding this consent and the Services and have had my questions answered to my satisfaction. ?I give my informed consent for the services to be provided through the use of telemedicine in my medical care ? ? ? ?

## 2021-08-13 NOTE — Telephone Encounter (Signed)
? ?  Pre-operative Risk Assessment  ?  ?Patient Name: Leonard Donovan  ?DOB: [birth year] ?MRN: [id number]  ? ?  ? ?Request for Surgical Clearance   ? ?Procedure:   Colonoscopy ? ?Date of Surgery:  Clearance 08/19/21                              ?   ?Surgeon:  Linus Salmons ?Surgeon's Group or Practice Name:  [facility] ?Phone number:  [phone] ?Fax number:  [phone] ?  ?Type of Clearance Requested:   ?- Medical  ?- Pharmacy:  Hold Aspirin and Clopidogrel (Plavix) hold 5 to 7 days prior ?  ?Type of Anesthesia:  Not Indicated ?  ?Additional requests/questions:  Please advise surgeon/provider what medications should be held. ?Please fax a copy of clearance to the surgeon's office. ? ?Signed, ?Thedore Mins Pegge Cumberledge   ?08/13/2021, 10:14 AM  ? ?

## 2021-08-13 NOTE — Telephone Encounter (Signed)
[name] pt's wife has been advised for pt to begin holding his Plavix as of tomorrow. We will still keep the tele pre op appt 08/16/21 @ 4 pm. Pt's wife thanked [place] for the help.  ?

## 2021-08-13 NOTE — Telephone Encounter (Signed)
I s/w with pt's [name] pt's wife. We did schedule a teel pre op appt for Monday 08/16/21 @ 4 pm. Pt's wife did state though that he is supposed to hold his Plavix 5-7 days. This would mean that the pt will need to begin to hold Plavix as of tomorrow which will be 5 days prior. I informed the pt's wife let me update the pre op provider and get confirmation from him ok to begin holding Plavix as of tomorrow. I assured the pt's wife that I will call back once I have confirmation.   ?

## 2021-08-16 ENCOUNTER — Encounter: Payer: Self-pay | Admitting: Physician Assistant

## 2021-08-16 ENCOUNTER — Other Ambulatory Visit: Payer: Self-pay

## 2021-08-16 ENCOUNTER — Ambulatory Visit (INDEPENDENT_AMBULATORY_CARE_PROVIDER_SITE_OTHER): Payer: BC Managed Care – PPO | Admitting: Physician Assistant

## 2021-08-16 DIAGNOSIS — Z01810 Encounter for preprocedural cardiovascular examination: Secondary | ICD-10-CM

## 2021-08-16 NOTE — Telephone Encounter (Addendum)
Recommendations faxed back to [facility] at [phone], confirmation received and placed in medical records  ?

## 2021-08-16 NOTE — Progress Notes (Signed)
? ?Virtual Visit via Telephone Note  ? ?This visit type was conducted due to national recommendations for restrictions regarding the COVID-19 Pandemic (e.g. social distancing) in an effort to limit this patient's exposure and mitigate transmission in our community.  Due to his co-morbid illnesses, this patient is at least at moderate risk for complications without adequate follow up.  This format is felt to be most appropriate for this patient at this time.  The patient did not have access to video technology/had technical difficulties with video requiring transitioning to audio format only (telephone).  All issues noted in this document were discussed and addressed.  No physical exam could be performed with this format.  Please refer to the patient's chart for his  consent to telehealth for [facility]. ?Evaluation Performed:  Preoperative cardiovascular risk assessment ? ?This visit type was conducted due to national recommendations for restrictions regarding the COVID-19 Pandemic (e.g. social distancing).  This format is felt to be most appropriate for this patient at this time.  All issues noted in this document were discussed and addressed.  No physical exam was performed (except for noted visual exam findings with Video Visits).  Please refer to the patient's chart (MyChart message for video visits and phone note for telephone visits) for the patient's consent to telehealth for [facility]. ?_____________  ? ?Date:  08/16/2021  ? ?Patient ID:  Leonard Donovan, DOB Oct 05, 1965, MRN [id number] ?Patient Location:  ?Home ?Provider location:   ?Office ? ?Primary Care Provider:  Montez Hagemanurner, [place] C, DO ?Primary Cardiologist:  Olga MillersBrian Crenshaw, MD ? ?Chief Complaint  ?  ?56 y.o. y/o male with a h/o CVA on plavix, CAD s/p CABG, HTN, prediabetes, and orthostatic hypotension who is pending colonoscopy, and presents today for telephonic preoperative cardiovascular risk assessment. ? ?Past Medical History  ?  ?Past Medical  History:  ?Diagnosis Date  ? CAD (coronary artery disease) of bypass graft   ? CABG x 4  ? Glaucoma   ? H/O: CVA (cerebrovascular accident) 2022  ? Hyperlipidemia LDL goal <70   ? Hypertension   ? ?Past Surgical History:  ?Procedure Laterality Date  ? CORONARY ARTERY BYPASS GRAFT N/A 07/08/2020  ? Procedure: CORONARY ARTERY BYPASS GRAFTING (CABG), ON PUMP, TIMES FOUR, LEFT INTERNAL MAMMARY ARTERY TO LAD, RIGHT SVG TO PDA, DISTAL CIRCUMFLEX, AND OM1;  Surgeon: Delight OvensGerhardt, Edward B, MD;  Location: [facility] OR;  Service: Open Heart Surgery;  Laterality: N/A;  ? ENDOVEIN HARVEST OF GREATER SAPHENOUS VEIN Right 07/08/2020  ? Procedure: ENDOVEIN HARVEST OF RIGHT GREATER SAPHENOUS VEIN;  Surgeon: Delight OvensGerhardt, Edward B, MD;  [facility];  Service: Open Heart Surgery;  Laterality: Right;  ? LEFT HEART CATH AND CORONARY ANGIOGRAPHY N/A 07/02/2020  ? Procedure: LEFT HEART CATH AND CORONARY ANGIOGRAPHY;  Surgeon: Corky CraftsVaranasi, Jayadeep S, MD;  [facility];  Service: Cardiovascular;  Laterality: N/A;  ? NO PAST SURGERIES    ? SHOULDER ARTHROSCOPY  04/12/2021  ? TEE WITHOUT CARDIOVERSION N/A 07/08/2020  ? Procedure: TRANSESOPHAGEAL ECHOCARDIOGRAM (TEE);  Surgeon: Delight OvensGerhardt, Edward B, MD;  [facility];  Service: Open Heart Surgery;  Laterality: N/A;  ? ? ?Allergies ? ?No Known Allergies ? ?History of Present Illness  ?  ?Leonard Donovan is a 56 y.o. male who presents via audio/video conferencing for a telehealth visit today.  Pt was last seen in cardiology clinic on 02/16/21, by Dr. Jens Somrenshaw.  At that time Leonard Donovan was doing well.  he is now pending colonoscopy.  Since his last visit, he underwent shoulder surgery without cardiac complications (Nov 123456). He is sedentary, but can complete 4.0 METS (flight of stairs, grocery store, walks on treadmill at a 3.5 speed).  ? ?Home Medications  ?  ?Prior to Admission medications   ?Medication Sig Start Date End Date Taking? Authorizing Provider  ?acetaminophen (TYLENOL) 500  MG tablet Take 2 tablets (1,000 mg total) by mouth every 8 (eight) hours as needed for mild pain (or discomfort). 07/22/20   Elodia Florence., MD  ?amLODipine (NORVASC) 10 MG tablet Take 1 tablet (10 mg total) by mouth daily. 02/16/21 08/13/21  Lelon Perla, MD  ?aspirin EC 81 MG EC tablet Take 1 tablet (81 mg total) by mouth daily. Swallow whole. 07/11/20   Nani Skillern, PA-C  ?atorvastatin (LIPITOR) 80 MG tablet Take 1 tablet (80 mg total) by mouth daily. 09/08/20   Lelon Perla, MD  ?carvedilol (COREG) 12.5 MG tablet Take 1 tablet (12.5 mg total) by mouth 2 (two) times daily. 02/16/21   Lelon Perla, MD  ?clopidogrel (PLAVIX) 75 MG tablet Take 1 tablet (75 mg total) by mouth daily. 09/08/20   Lelon Perla, MD  ?ezetimibe (ZETIA) 10 MG tablet Take 1 tablet (10 mg total) by mouth daily. 02/16/21 08/13/21  Lelon Perla, MD  ?Multiple Vitamins-Minerals (MULTIVITAMIN WITH MINERALS) tablet Take 1 tablet by mouth daily.    [provider]  ?nitroGLYCERIN (NITROSTAT) 0.4 MG SL tablet Place 1 tablet (0.4 mg total) under the tongue every 5 (five) minutes as needed. 08/07/20 08/13/21  Erlene Quan, PA-C  ?valsartan (DIOVAN) 160 MG tablet TAKE 1 TABLET BY MOUTH EVERY DAY 04/05/21   Lelon Perla, MD  ? ? ?Physical Exam  ?  ?Vital Signs:  Leonard Donovan does not have vital signs available for review today. ? ?Given telephonic nature of communication, physical exam is limited. ?AAOx3. NAD. Normal affect.  Speech and respirations are unlabored. ? ?Accessory Clinical Findings  ?  ?None ? ?Assessment & Plan  ?  ?1.  Preoperative Cardiovascular Risk Assessment: ? ?According to the RCRI, he has a 6.6% risk of Mace.  He underwent shoulder surgery in November 2020 without cardiac complications.  He is able to complete 4.0 METS without angina.  He has had no interval change in his cardiac history.  He understands his risk and wishes to proceed. ? ?Therefore, based on ACC/AHA guidelines, the  patient would be at acceptable risk for the planned procedure without further cardiovascular testing.  ? ?The patient was advised that if he develops new symptoms prior to surgery to contact our office to arrange for a follow-up visit, and she verbalized understanding. ? ? ?Per Dr. Jacalyn Lefevre note on 03/09/21: ?"Ok to hold plavix but would continue ASA".  He may hold Plavix for 5 days but continue on aspirin. He will need to restart Plavix as soon as possible afterward at the surgeon's discretion. ?He has already started holding Plavix in anticipation for colonoscopy. ? ? ?COVID-19 Education: ?The signs and symptoms of COVID-19 were discussed with the patient and how to seek care for testing (follow up with PCP or arrange E-visit).  The importance of social distancing was discussed today. ? ?Patient Risk:   ?After full review of this patient's history and clinical status, I feel that he is at least moderate risk for cardiac complications at this time, thus necessitating a telehealth visit sooner than our first available in office visit. ? ?Time:   ?  Today, I have spent 10 minutes with the patient with telehealth technology discussing medical history, symptoms, and management plan.   ? ? ?Ledora Bottcher, PA ? ?08/16/2021, 4:12 PM ?

## 2021-08-16 NOTE — Telephone Encounter (Signed)
Looks like he obtained clearance from cardiology. As he is stable from stroke standpoint over the past year without any reoccurring strokes, okay to hold Plavix 5 to 7 days as requested with small but acceptable risk of recurrent stroke while off therapy.  Recommend restarting immediately after or once hemodynamically stable. ?

## 2021-08-16 NOTE — Telephone Encounter (Signed)
Forms faxed back to [phone], confirmation received and placed in medical records.  ?

## 2021-08-16 NOTE — Telephone Encounter (Signed)
Signed and placed in outbox.  Thank you. ?

## 2021-08-20 ENCOUNTER — Other Ambulatory Visit: Payer: Self-pay | Admitting: Cardiology

## 2021-09-15 ENCOUNTER — Telehealth: Payer: Self-pay | Admitting: *Deleted

## 2021-09-15 NOTE — Telephone Encounter (Signed)
Received STD paperwork, on NP desk for review, signature.  ?

## 2021-09-16 NOTE — Telephone Encounter (Signed)
Signed and placed in outbox.  Thank you. ?

## 2021-09-16 NOTE — Telephone Encounter (Signed)
STD papers sent to medical records for processing. ?

## 2021-09-20 DIAGNOSIS — Z0271 Encounter for disability determination: Secondary | ICD-10-CM

## 2021-09-21 NOTE — Progress Notes (Signed)
? ? ? ? ?HPI: Follow-up CAD.  Patient ruled in for non-ST elevation myocardial infarction February 2022.  Cardiac catheterization revealed severe three-vessel coronary artery disease.  Echocardiogram showed normal LV function.  Patient had coronary artery bypass graft with LIMA to the LAD, saphenous vein graft to the PDA, distal circumflex and OM1.  Note preoperative carotid Dopplers showed 40 to 59% right carotid artery stenosis.  Following his discharge he returned due to slurred speech and left upper extremity weakness and was found to have CVA.  Repeat echocardiogram showed normal LV function.  Outpatient monitor April 2022 showed sinus rhythm with no atrial fibrillation.  Following his discharge he was seen in the office with 3 episodes of syncope that was felt likely to be orthostatic mediated.  He had 1 in the office and systolic blood pressure was in the 80s.  His medications were discontinued.  We have gradually increased his medications as an outpatient.  Since last seen he denies increased dyspnea on exertion, orthopnea, PND, pedal edema or recurrent syncope.  He did have a dizzy spell approximately 1 week ago.  His chest is sore but he has not had chest pain.  He does describe fatigue. ? ?Current Outpatient Medications  ?Medication Sig Dispense Refill  ? acetaminophen (TYLENOL) 500 MG tablet Take 2 tablets (1,000 mg total) by mouth every 8 (eight) hours as needed for mild pain (or discomfort). 30 tablet 0  ? aspirin EC 81 MG EC tablet Take 1 tablet (81 mg total) by mouth daily. Swallow whole. 30 tablet 11  ? atorvastatin (LIPITOR) 80 MG tablet TAKE 1 TABLET BY MOUTH EVERY DAY 90 tablet 3  ? carvedilol (COREG) 12.5 MG tablet Take 1 tablet (12.5 mg total) by mouth 2 (two) times daily. 180 tablet 3  ? clopidogrel (PLAVIX) 75 MG tablet TAKE 1 TABLET BY MOUTH EVERY DAY 90 tablet 3  ? Multiple Vitamins-Minerals (MULTIVITAMIN WITH MINERALS) tablet Take 1 tablet by mouth daily.    ? valsartan (DIOVAN) 160 MG  tablet TAKE 1 TABLET BY MOUTH EVERY DAY 90 tablet 1  ? amLODipine (NORVASC) 10 MG tablet Take 1 tablet (10 mg total) by mouth daily. 180 tablet 3  ? ezetimibe (ZETIA) 10 MG tablet Take 1 tablet (10 mg total) by mouth daily. 90 tablet 3  ? nitroGLYCERIN (NITROSTAT) 0.4 MG SL tablet Place 1 tablet (0.4 mg total) under the tongue every 5 (five) minutes as needed. 25 tablet 3  ? ?No current facility-administered medications for this visit.  ? ? ? ?Past Medical History:  ?Diagnosis Date  ? CAD (coronary artery disease) of bypass graft   ? CABG x 4  ? Glaucoma   ? H/O: CVA (cerebrovascular accident) 2022  ? Hyperlipidemia LDL goal <70   ? Hypertension   ? ? ?Past Surgical History:  ?Procedure Laterality Date  ? CORONARY ARTERY BYPASS GRAFT N/A 07/08/2020  ? Procedure: CORONARY ARTERY BYPASS GRAFTING (CABG), ON PUMP, TIMES FOUR, LEFT INTERNAL MAMMARY ARTERY TO LAD, RIGHT SVG TO PDA, DISTAL CIRCUMFLEX, AND OM1;  Surgeon: Delight Ovens, MD;  Location: [facility] OR;  Service: Open Heart Surgery;  Laterality: N/A;  ? ENDOVEIN HARVEST OF GREATER SAPHENOUS VEIN Right 07/08/2020  ? Procedure: ENDOVEIN HARVEST OF RIGHT GREATER SAPHENOUS VEIN;  Surgeon: Delight Ovens, MD;  Location: [facility] OR;  Service: Open Heart Surgery;  Laterality: Right;  ? LEFT HEART CATH AND CORONARY ANGIOGRAPHY N/A 07/02/2020  ? Procedure: LEFT HEART CATH AND CORONARY ANGIOGRAPHY;  Surgeon: Corky Crafts,  MD;  Location: [facility] INVASIVE CV LAB;  Service: Cardiovascular;  Laterality: N/A;  ? NO PAST SURGERIES    ? SHOULDER ARTHROSCOPY  04/12/2021  ? TEE WITHOUT CARDIOVERSION N/A 07/08/2020  ? Procedure: TRANSESOPHAGEAL ECHOCARDIOGRAM (TEE);  Surgeon: Delight Ovens, MD;  Location: [facility] OR;  Service: Open Heart Surgery;  Laterality: N/A;  ? ? ?Social History  ? ?Socioeconomic History  ? Marital status: Married  ?  Spouse name: Albin Felling  ? Number of children: Not on file  ? Years of education: Not on file  ? Highest education level: Not on file  ?Occupational  History  ? Not on file  ?Tobacco Use  ? Smoking status: Former  ? Smokeless tobacco: Former  ?Vaping Use  ? Vaping Use: Never used  ?Substance and Sexual Activity  ? Alcohol use: Not Currently  ? Drug use: Not Currently  ? Sexual activity: Not on file  ?Other Topics Concern  ? Not on file  ?Social History Narrative  ? Lives with wife  ? ?Social Determinants of Health  ? ?Financial Resource Strain: Not on file  ?Food Insecurity: Not on file  ?Transportation Needs: Not on file  ?Physical Activity: Not on file  ?Stress: Not on file  ?Social Connections: Not on file  ?Intimate Partner Violence: Not on file  ? ? ?Family History  ?Problem Relation Age of Onset  ? Hypertension Father   ? CAD Sister   ?     diagnosed in her 60s  ? Heart attack Sister   ? ? ?ROS: Fatigue but no fevers or chills, productive cough, hemoptysis, dysphasia, odynophagia, melena, hematochezia, dysuria, hematuria, rash, seizure activity, orthopnea, PND, pedal edema, claudication. Remaining systems are negative. ? ?Physical Exam: ?Well-developed well-nourished in no acute distress.  ?Skin is warm and dry.  ?HEENT is normal.  ?Neck is supple.  ?Chest is clear to auscultation with normal expansion.  ?Cardiovascular exam is regular rate and rhythm.  ?Abdominal exam nontender or distended. No masses palpated. ?Extremities show no edema. ?neuro grossly intact ? ?ECG-sinus bradycardia at a rate of 56, no ST changes.  Personally reviewed ? ?A/P ? ?1 coronary artery disease-patient denies chest pain.  Continue aspirin and statin.   ? ?2 hypertension-patient's blood pressure is controlled.  However he is having some fatigue.  I wonder if his beta-blocker could be contributing.  Decrease carvedilol to 6.25 mg twice daily and follow.  I also asked him to increase his activities as given his previous bypass surgery and CVA there may be a component of deconditioning. ? ?3 hyperlipidemia-continue Lipitor and Zetia. ? ?4 carotid artery disease-arrange follow-up  carotid Dopplers. ? ?5 History of CVA-we will continue aspirin and Plavix.  He was placed on Plavix by neurology following his CVA.  He is scheduled to see them in July and will ask if aspirin by itself would be sufficient. ? ?[name], MD ? ? ? ?

## 2021-10-04 ENCOUNTER — Encounter: Payer: Self-pay | Admitting: Cardiology

## 2021-10-04 ENCOUNTER — Ambulatory Visit: Payer: BC Managed Care – PPO | Admitting: Cardiology

## 2021-10-04 VITALS — BP 122/80 | HR 56 | Ht 69.0 in | Wt 240.0 lb

## 2021-10-04 DIAGNOSIS — I25810 Atherosclerosis of coronary artery bypass graft(s) without angina pectoris: Secondary | ICD-10-CM | POA: Diagnosis not present

## 2021-10-04 DIAGNOSIS — I6523 Occlusion and stenosis of bilateral carotid arteries: Secondary | ICD-10-CM

## 2021-10-04 DIAGNOSIS — E7800 Pure hypercholesterolemia, unspecified: Secondary | ICD-10-CM | POA: Diagnosis not present

## 2021-10-04 DIAGNOSIS — I10 Essential (primary) hypertension: Secondary | ICD-10-CM | POA: Diagnosis not present

## 2021-10-04 MED ORDER — CARVEDILOL 6.25 MG PO TABS: 6.2500 mg | ORAL_TABLET | Freq: Two times a day (BID) | ORAL | 3 refills | Status: AC

## 2021-10-04 NOTE — Patient Instructions (Signed)
Medication Instructions:  ? ?DECREASE CARVEDILOL TO 6.25 MG TWICE DAILY=1/2 OF THE 12.5 MG TABLET TWICE DAILY ? ?*If you need a refill on your cardiac medications before your next appointment, please call your pharmacy* ? ? ?Testing/Procedures: ? ?Your physician has requested that you have a carotid duplex. This test is an ultrasound of the carotid arteries in your neck. It looks at blood flow through these arteries that supply the brain with blood. Allow one hour for this exam. There are no restrictions or special instructions. [facility] ? ? ?Follow-Up: ?At [facility], you and your health needs are our priority.  As part of our continuing mission to provide you with exceptional heart care, we have created designated Provider Care Teams.  These Care Teams include your primary Cardiologist (physician) and Advanced Practice Providers (APPs -  Physician Assistants and Nurse Practitioners) who all work together to provide you with the care you need, when you need it. ? ?We recommend signing up for the patient portal called "MyChart".  Sign up information is provided on this After Visit Summary.  MyChart is used to connect with patients for Virtual Visits (Telemedicine).  Patients are able to view lab/test results, encounter notes, upcoming appointments, etc.  Non-urgent messages can be sent to your provider as well.   ?To learn more about what you can do with MyChart, go to [email or web address].   ? ?Your next appointment:   ?6 month(s) ? ?The format for your next appointment:   ?In Person ? ?Provider:   ?[name], MD   ? ? ? ?Important Information About Sugar ? ? ? ? ?  ?

## 2021-10-14 ENCOUNTER — Ambulatory Visit (HOSPITAL_COMMUNITY)
Admission: RE | Admit: 2021-10-14 | Discharge: 2021-10-14 | Disposition: A | Discharge status: Home / Self Care | Payer: BC Managed Care – PPO | Source: Ambulatory Visit | Attending: Internal Medicine | Admitting: Internal Medicine

## 2021-10-14 DIAGNOSIS — I6523 Occlusion and stenosis of bilateral carotid arteries: Secondary | ICD-10-CM | POA: Insufficient documentation

## 2021-10-15 ENCOUNTER — Encounter: Payer: Self-pay | Admitting: Cardiology

## 2021-10-15 MED ORDER — VALSARTAN 320 MG PO TABS: 320.0000 mg | ORAL_TABLET | Freq: Every day | ORAL | 3 refills | Status: DC

## 2021-10-17 ENCOUNTER — Other Ambulatory Visit: Payer: Self-pay | Admitting: Cardiology

## 2021-12-07 ENCOUNTER — Ambulatory Visit: Payer: BC Managed Care – PPO | Admitting: Adult Health

## 2021-12-07 ENCOUNTER — Encounter: Payer: Self-pay | Admitting: Adult Health

## 2021-12-07 VITALS — BP 135/96 | HR 67 | Ht 69.0 in | Wt 240.0 lb

## 2021-12-07 DIAGNOSIS — F482 Pseudobulbar affect: Secondary | ICD-10-CM

## 2021-12-07 DIAGNOSIS — R519 Headache, unspecified: Secondary | ICD-10-CM

## 2021-12-07 DIAGNOSIS — F0630 Mood disorder due to known physiological condition, unspecified: Secondary | ICD-10-CM

## 2021-12-07 DIAGNOSIS — I69398 Other sequelae of cerebral infarction: Secondary | ICD-10-CM | POA: Diagnosis not present

## 2021-12-07 DIAGNOSIS — I639 Cerebral infarction, unspecified: Secondary | ICD-10-CM

## 2021-12-07 DIAGNOSIS — I6930 Unspecified sequelae of cerebral infarction: Secondary | ICD-10-CM

## 2021-12-07 MED ORDER — AMITRIPTYLINE HCL 25 MG PO TABS
25.0000 mg | ORAL_TABLET | Freq: Every day | ORAL | 3 refills | Status: DC
Start: 2021-12-07 — End: 2021-12-30

## 2021-12-07 NOTE — Patient Instructions (Addendum)
Start amitriptyline 25mg  nightly to help with anxiety/depression  You will be called to schedule an MRI brain - this should be an open MRI so please ensure this is the case with scheduling  Consider participating in speech and occupational therapy - please let me know if you would like to do this  Continue aspirin 81 mg daily and clopidogrel 75 mg daily  and atorvastatin and Zetia  for secondary stroke prevention  Continue to follow up with PCP regarding cholesterol and blood pressure management  Maintain strict control of hypertension with blood pressure goal below 130/90 and cholesterol with LDL cholesterol (bad cholesterol) goal below 70 mg/dL.   Signs of a Stroke? Follow the BEFAST method:  Balance Watch for a sudden loss of balance, trouble with coordination or vertigo Eyes Is there a sudden loss of vision in one or both eyes? Or double vision?  Face: Ask the person to smile. Does one side of the face droop or is it numb?  Arms: Ask the person to raise both arms. Does one arm drift downward? Is there weakness or numbness of a leg? Speech: Ask the person to repeat a simple phrase. Does the speech sound slurred/strange? Is the person confused ? Time: If you observe any of these signs, call 911.     Followup in the future with me in 4 months or call earlier if needed       Thank you for coming to see at [facility]. I hope we have been able to provide you high quality care today.  You may receive a patient satisfaction survey over the next few weeks. We would appreciate your feedback and comments so that we may continue to improve ourselves and the health of our patients.     Amitriptyline Tablets What is this medication? AMITRIPTYLINE (a mee TRIP ti leen) treats depression. It increases the amount of serotonin and norepinephrine in the brain, hormones that help regulate mood. It belongs to a group of medications called tricyclic antidepressants  (TCAs). This medicine may be used for other purposes; ask your health care provider or pharmacist if you have questions. COMMON BRAND NAME(S): Elavil, Vanatrip What should I tell my care team before I take this medication? They need to know if you have any of these conditions: An alcohol problem Asthma, trouble breathing Bipolar disorder or schizophrenia Difficulty passing urine, prostate trouble Glaucoma Heart disease or previous heart attack Liver disease Over active thyroid Seizures Thoughts or plans of suicide, a previous suicide attempt, or family history of suicide attempt An unusual or allergic reaction to amitriptyline, other medications, foods, dyes, or preservatives Pregnant or trying to get pregnant Breast-feeding How should I use this medication? Take this medication by mouth with a drink of water. Follow the directions on the prescription label. You can take the tablets with or without food. Take your medication at regular intervals. Do not take it more often than directed. Do not stop taking this medication suddenly except upon the advice of your care team. Stopping this medication too quickly may cause serious side effects or your condition may worsen. A special MedGuide will be given to you by the pharmacist with each prescription and refill. Be sure to read this information carefully each time. Talk to your care team regarding the use of this medication in children. Special care may be needed. Overdosage: If you think you have taken too much of this medicine contact a poison control center or emergency room at once. NOTE:  This medicine is only for you. Do not share this medicine with others. What if I miss a dose? If you miss a dose, take it as soon as you can. If it is almost time for your next dose, take only that dose. Do not take double or extra doses. What may interact with this medication? Do not take this medication with any of the following: Arsenic  trioxide Certain medications used to regulate abnormal heartbeat or to treat other heart conditions Cisapride Droperidol Halofantrine Linezolid MAOIs like Carbex, Eldepryl, Marplan, Nardil, and Parnate Methylene blue Other medications for mental depression Phenothiazines like perphenazine, thioridazine and chlorpromazine Pimozide Probucol Procarbazine Sparfloxacin St. John's Wort This medication may also interact with the following: Atropine and related medications like hyoscyamine, scopolamine, tolterodine and others Barbiturate medications for inducing sleep or treating seizures, like phenobarbital Cimetidine Disulfiram Ethchlorvynol Thyroid hormones such as levothyroxine Ziprasidone This list may not describe all possible interactions. Give your health care provider a list of all the medicines, herbs, non-prescription drugs, or dietary supplements you use. Also tell them if you smoke, drink alcohol, or use illegal drugs. Some items may interact with your medicine. What should I watch for while using this medication? Tell your care team if your symptoms do not get better or if they get worse. Visit your care team for regular checks on your progress. Because it may take several weeks to see the full effects of this medication, it is important to continue your treatment as prescribed by your care team. Patients and their families should watch out for new or worsening thoughts of suicide or depression. Also watch out for sudden changes in feelings such as feeling anxious, agitated, panicky, irritable, hostile, aggressive, impulsive, severely restless, overly excited and hyperactive, or not being able to sleep. If this happens, especially at the beginning of treatment or after a change in dose, call your care team. You may get drowsy or dizzy. Do not drive, use machinery, or do anything that needs mental alertness until you know how this medication affects you. Do not stand or sit up  quickly, especially if you are an older patient. This reduces the risk of dizzy or fainting spells. Alcohol may interfere with the effect of this medication. Avoid alcoholic drinks. Do not treat yourself for coughs, colds, or allergies without asking your care team for advice. Some ingredients can increase possible side effects. Your mouth may get dry. Chewing sugarless gum or sucking hard candy, and drinking plenty of water will help. Contact your care team if the problem does not go away or is severe. This medication may cause dry eyes and blurred vision. If you wear contact lenses you may feel some discomfort. Lubricating drops may help. See your eye doctor if the problem does not go away or is severe. This medication can cause constipation. Try to have a bowel movement at least every 2 to 3 days. If you do not have a bowel movement for 3 days, call your care team. This medication can make you more sensitive to the sun. Keep out of the sun. If you cannot avoid being in the sun, wear protective clothing and use sunscreen. Do not use sun lamps or tanning beds/booths. What side effects may I notice from receiving this medication? Side effects that you should report to your care team as soon as possible: Allergic reactions--skin rash, itching, hives, swelling of the face, lips, tongue, or throat Heart rhythm changes-- fast or irregular heartbeat, dizziness, feeling faint or lightheaded, chest  pain, trouble breathing Serotonin syndrome--irritability, confusion, fast or irregular heartbeat, muscle stiffness, twitching muscles, sweating, high fever, seizure, chills, vomiting, diarrhea Sudden eye pain or change in vision such as blurry vision, seeing halos around lights, vision loss Thoughts of suicide or self-harm, worsening mood, feelings of depression Side effects that usually do not require medical attention (report to your care team if they continue or are bothersome): Change in appetite or  weight Change in sex drive or performance Constipation Dizziness Drowsiness Dry mouth Tremors This list may not describe all possible side effects. Call your doctor for medical advice about side effects. You may report side effects to FDA at 1-800-FDA-1088. Where should I keep my medication? Keep out of the reach of children and pets. Store at room temperature between 20 and 25 degrees C (68 and 77 degrees F). Throw away any unused medication after the expiration date. NOTE: This sheet is a summary. It may not cover all possible information. If you have questions about this medicine, talk to your doctor, pharmacist, or health care provider.  2023 Elsevier/Gold Standard (2020-06-12 00:00:00)

## 2021-12-07 NOTE — Progress Notes (Signed)
[facility] [address] [place]. [place] [address] [phone]       OFFICE FOLLOW UP NOTE  [name] Date of Birth:  09/26/1965 Medical Record Number:  [id number]   Reason for visit: stroke f/u    SUBJECTIVE:   CHIEF COMPLAINT:  Chief Complaint  Patient presents with   Follow-up    Rm 3 with spouse Leonard Donovan Pt states he is well and stable. Wife states he has been drooling more, more weakness in L hand, headaches,  trouble choking on things.       HPI:   Update 12/07/2021 JM: Patient returns for 78-monthstroke follow-up accompanied by his wife. Reports over the past 2-3 weeks, patient has had more difficulty holding objects with left hand, increased drooling and more episodes of choking (usually with eating/drinking but can also be on his saliva). Wife also notes possible worsening of concentration and forgetfulness and worsening anxiety/irritability with panic attacks  as well as periods of inappropriate laughing or crying.  Slurred speech still present and can worsen with fatigue which is his baseline. Continued dizziness and imbalance at baseline. Occasional falls still but has been less compared to prior visit, still without any significant injury.  Patient does note experiencing a severe right occipital headache at night about 2 weeks ago, described as a sharp stabbing pain that lasted about 2 minutes then resolved. He informed his wife the next morning. He had a mild headache a couple days later in the same area but nothing since that time.   Reports compliance on aspirin and Plavix without side effects. Of note, did hold plavix for scheduled colonoscopy 5 days prior to procedure on 6/16 but unable to complete colonoscopy as he was not "cleared out enough". He did remain on aspirin during that duration.  Compliant on atorvastatin, denies side effects.  Blood pressure today 135/96.  Did have some BP med adjustment back in May and has been stable since  that time, denies any low BP readings at home. Reports continued nightly CPAP compliance. Did not discuss fully today due to time constraint.  No further concerns at this time.       History provided for reference purposes only Update 06/09/2021 [name]:[id number] for initial CPAP compliance visit and stroke follow up. Previously seen 4 months ago for stroke f/u. Completed HST 02/24/2021 which showed severe OSA with strong REM sleep component with total AHI 53.2/h and recommended initiating AutoPap which was started on 03/25/2021.  Compliance report from 05/09/2021 -06/07/2021 shows 24 out of 30 usage days for 80% compliance with 20 days greater than 4 hours per 67% compliance.  Average usage 5 hours and 18 minutes.  Residual AHI 10.7.  Leaks in the 95th percentile 5.8 (max 41.9).  Pressure in the 95th percentile 12.6 with mean pressure 6 and max pressure 18 and EPR level 3. Has difficulty tolerating pressure at times as he feels he cannot breathe and also c/o leaks around his mask. Does notice some improvement of daytime fatigue and sleeping better at night.  Otherwise tolerating mask well.  Current use of full facemask.  Epworth Sleepiness Scale 9.  Stable from stroke standpoint - residual mild LUE weakness, cognitive impairment and gait impairment with imbalance stable. Does note some improvement of LUE strength especially left hand since s/p left rotator cuff repair and procedure for adhesive capsulitis and subacromial decompression of left shoulder on 04/12/2021 by Dr. [name] Working with PT with gradual improvement. Gradually trying to drive -  still having occultly with inattention. Recurrent falls thankfully without severe injury or hitting head - usually on uneven ground (walking in the woods). Tried Nuedexta for suspected PBA - felt this caused worsening depression. Still occasionally laughing at inappropriate times but more so seems to be heightened emotions. Remains on disability - in the process  of applying for Social Security disability.  Denies new stroke/TIA symptoms.  Compliant on aspirin, Plavix, atorvastatin and Zetia without side effects.  Blood pressure today 155/95.  Routinely follows with PCP and cardiology.  No new concerns at this time.   Update 02/16/2021 JM: Mr. Donovan returns for 53-monthstroke follow-up accompanied by his wife, Leonard Donovan  Overall doing well.  Completed SLP 8/16 as he met all goals and only rare word finding difficulty and occasional deviations in attention and reduced awareness.  Left-sided weakness with some improvement since prior visit.  Followed by orthopedics for adhesive capsulitis and currently working with PT noting some improvement. Continued occassional imbalance and occasional swallowing difficulties but seems to be more related to inattention.  He has not previously worked with PT for imbalance as he was previously working with PT for cardiac rehab. Per wife, continued uncontrolled crying and occasional uncontrolled laughing -previously discussed Nuedexta for PBA -declined interest at that time but now interested in trialing.  Remains on disability previously working in [employer]  Denies new stroke/TIA symptoms.  Remains on aspirin and Plavix as well as atorvastatin and zetia without side effects.  Blood pressure today 155/98. Routinely monitors at home - cardiology recently increased BP meds last week.  Evaluated by Dr. DBrett Fairyfor concern of possible sleep apnea and plans on completing HST tonight.  No further concerns at this time  Update 11/24/2020 JM: Mr. MAlthausreturns for 317-monthtroke follow-up accompanied by his wife.  Patient reports residual left-sided weakness/pain, occasional imbalance and fatigue but overall improving.  Currently working with OT for residual left-sided weakness and shoulder pain -currently being followed by orthopedics with MRI completed yesterday and has scheduled visit next week for further evaluation due to continued  shoulder pain interfering with therapy sessions as well as sleep.  Also continues to work with SLP for aphasia, dysarthria and cognitive communication deficit.  Completed MBS 4/5 which was normal although he does have occasional difficulty swallowing water.  Denies new stroke/TIA symptoms. Remains on aspirin and Plavix as well as atorvastatin and zetia without associated side effects.  Blood pressure today 125/83. Completed 30-day cardiac event monitor which was negative for atrial fibrillation. He does report daytime fatigue as well as insomnia -has not previously underwent sleep study.  Wife is concerned regarding labile emotions where he may laugh or cry uncontrollably during situations that may not be that funny or sad.  No further concerns at this time.  Initial visit 08/24/2020 JM: Mr. MiKlimowiczs being seen for hospital follow-up accompanied by his wife  Reports residual left-sided weakness, left facial droop, swallowing difficulties and cognitive difficulties Evaluated by [place] SLP 3/7 - noted mild cognitive linguistic deficits; no concerns of aspiration -personally reviewed evaluation note -no further SLP recommended as patient and wife reported that he was at baseline At todays visit, wife reports occasional confusion and delayed processing which patient agrees with Report of swallowing difficulties only with water otherwise denies difficulty Denies new stroke/TIA symptoms 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 Has cards f/u on Wednesday Currently wearing cardiac monitor  which will be completed on 4/3  No further concerns at this time  Stroke admission 07/20/2020 Mr. IKEEM CLECKLER is a 56 y.o. male with history of hypertension, coronary artery disease (s/p quadruple CABG on 07/08/2020), glaucoma, COVID-19 infection (sx on 06/07/2020, dx on 06/18/2020),  who presented on 07/20/2020 with left sided facial droop, slurred speech, and left upper extremity weakness.   Personally reviewed hospitalization pertinent progress notes, lab work and imaging with summary provided.  Evaluated by Dr. Erlinda Hong with stroke work-up revealing multifocal infarcts, largest at right pontine, likely related to recent cardiac surgery however other cardioembolic source cannot be ruled out.  Recommended 30-day cardiac event monitor outpatient to rule out A. fib.  On DAPT PTA and recommended continuation at discharge per cardiology recommendations s/p CABG 07/08/2020.  LDL 49 on atorvastatin 80 mg daily.  Other stroke risk factors include former tobacco use, obesity and suspected OSA.  Residual deficit of mild left facial droop, left hemiparesis and moderate cognitive deficits.  Stroke:  Multifocal infarcts, largest at right pontine, likely related to recent cardiac surgery. However, other cardioembolic source can not rule out Code Stroke CT head: Small focus of hypodensity in the right white matter. CTA head & neck: no intracranial arterial occlusion or high-grade stenosis. Bilateral carotid bifurcation atherosclerosis, right more than left MRI Acute right pontine infarct in addition to multiple small acute to early subacute infarcts within the bilateral frontal and parietal white matter, consistent with a central (cardiac or aortic) embolic source. No hemorrhage or mass effect. Recommend 30 day monitor to evaluate for atrial fibrillation as the source for stroke 2D Echo: EF 60 to 65% LDL 49 HgbA1c 5.6 VTE prophylaxis - Lovenox 76m daily Swallow eval: passed for heart healthy diet aspirin 81 mg daily and  Plavix prior to admission, continue aspirin 81 mg daily and clopidogrel 75 mg daily DAPT on discharge. Therapy recommendations:  outpt SLP Disposition: home         ROS:   14 system review of systems performed and negative with exception of those listed in HPI  PMH:  Past Medical History:  Diagnosis Date   CAD (coronary artery disease) of bypass graft    CABG x 4   Glaucoma    H/O: CVA (cerebrovascular accident) 2022   Hyperlipidemia LDL goal <70    Hypertension     PSH:  Past Surgical History:  Procedure Laterality Date   CORONARY ARTERY BYPASS GRAFT N/A 07/08/2020   Procedure: CORONARY ARTERY BYPASS GRAFTING (CABG), ON PUMP, TIMES FOUR, LEFT INTERNAL MAMMARY ARTERY TO LAD, RIGHT SVG TO PDA, DISTAL CIRCUMFLEX, AND OM1;  Surgeon: GGrace Isaac MD;  [facility]  Service: Open Heart Surgery;  Laterality: N/A;   ENDOVEIN HARVEST OF GREATER SAPHENOUS VEIN Right 07/08/2020   Procedure: ENDOVEIN HARVEST OF RIGHT GREATER SAPHENOUS VEIN;  Surgeon: GGrace Isaac MD;  [facility] [facility]  Service: Open Heart Surgery;  Laterality: Right;   LEFT HEART CATH AND CORONARY ANGIOGRAPHY N/A 07/02/2020   Procedure: LEFT HEART CATH AND CORONARY ANGIOGRAPHY;  Surgeon: VJettie Booze MD;  [facility];  Service: Cardiovascular;  Laterality: N/A;   NO PAST SURGERIES     SHOULDER ARTHROSCOPY  04/12/2021   TEE WITHOUT CARDIOVERSION N/A 07/08/2020   Procedure: TRANSESOPHAGEAL ECHOCARDIOGRAM (TEE);  Surgeon: Grace Isaac, MD;  Location: [facility];  Service: Open Heart Surgery;  Laterality: N/A;    Social History:  Social History   Socioeconomic History   Marital status: Married    Spouse name: Designer, jewellery   Number of children: Not on file   Years of education: Not on file   Highest education level: Not on file  Occupational History   Not on file  Tobacco Use   Smoking status: Former   Smokeless tobacco: Former  Scientific laboratory technician Use: Never used   Substance and Sexual Activity   Alcohol use: Not Currently   Drug use: Not Currently   Sexual activity: Not on file  Other Topics Concern   Not on file  Social History Narrative   Lives with wife   Social Determinants of Health   Financial Resource Strain: Not on file  Food Insecurity: Not on file  Transportation Needs: Not on file  Physical Activity: Not on file  Stress: Not on file  Social Connections: Not on file  Intimate Partner Violence: Not on file    Family History:  Family History  Problem Relation Age of Onset   Hypertension Father    CAD Sister        diagnosed in her 92s   Heart attack Sister     Medications:   Current Outpatient Medications on File Prior to Visit  Medication Sig Dispense Refill   acetaminophen (TYLENOL) 500 MG tablet Take 2 tablets (1,000 mg total) by mouth every 8 (eight) hours as needed for mild pain (or discomfort). 30 tablet 0   amLODipine (NORVASC) 10 MG tablet Take 1 tablet (10 mg total) by mouth daily. 180 tablet 3   aspirin EC 81 MG EC tablet Take 1 tablet (81 mg total) by mouth daily. Swallow whole. 30 tablet 11   atorvastatin (LIPITOR) 80 MG tablet TAKE 1 TABLET BY MOUTH EVERY DAY 90 tablet 3   carvedilol (COREG) 6.25 MG tablet Take 1 tablet (6.25 mg total) by mouth 2 (two) times daily. 180 tablet 3   clopidogrel (PLAVIX) 75 MG tablet TAKE 1 TABLET BY MOUTH EVERY DAY 90 tablet 3   ezetimibe (ZETIA) 10 MG tablet Take 1 tablet (10 mg total) by mouth daily. 90 tablet 3   Multiple Vitamins-Minerals (MULTIVITAMIN WITH MINERALS) tablet Take 1 tablet by mouth daily.     nitroGLYCERIN (NITROSTAT) 0.4 MG SL tablet Place 1 tablet (0.4 mg total) under the tongue every 5 (five) minutes as needed. 25 tablet 3   valsartan (DIOVAN) 320 MG tablet Take 1 tablet (320 mg total) by mouth daily. 90 tablet 3   No current facility-administered medications on file prior to visit.    Allergies:  No Known Allergies    OBJECTIVE:  Physical  Exam  Vitals:   12/07/21 1538  BP: (!) 135/96  Pulse: 67  Weight: 240 lb (108.9 kg)  Height: _0  (1.753 m)   Body mass index is 35.44 kg/m. No results found.  General: well developed, well nourished, very pleasant middle-age Caucasian male, seated, in no evident distress Head: head normocephalic and atraumatic.   Neck: supple with no carotid or supraclavicular bruits Cardiovascular: regular rate and rhythm, no murmurs Musculoskeletal: no deformity Skin:  no rash/petichiae Vascular:  Normal pulses all extremities   Neurologic Exam Mental Status: Awake and fully alert. Occasional word finding difficulty.  Follows commands without difficulty.  No evidence of dysarthria.  Oriented to place and time. Recent and remote memory intact. Attention span, concentration and fund of knowledge fluctuated during visit. Mood and affect appropriate.   Cranial Nerves: Pupils equal, briskly reactive to light. Extraocular movements full without nystagmus. Visual fields full to confrontation. Hearing intact. Facial sensation intact.  Moderate left lower facial weakness.  Tongue and palate moves normally and symmetrically.  Motor: Normal bulk and tone and strength right upper and lower extremity LUE: 4/5 deltoid and elbow extension; 4-/5 elbow flexion and handgrip.  Limited L shoulder ROM and decreased finger dexterity and grip strength LLE: 5/5 Sensory.: intact to touch , pinprick , position and vibratory sensation.  Coordination: Rapid alternating movements normal in all extremities except slightly decreased left hand. Finger-to-nose slightly impaired LUE and heel-to-shin performed accurately bilaterally.  Orbits right arm over left arm. Gait and Station: Arises from chair without difficulty. Stance is normal. Gait demonstrates normal stride length with mild imbalance without use of assistive device. Tandem walk and heel toe not attempted Reflexes: 1+ and symmetric. Toes downgoing.         ASSESSMENT: Leonard Donovan is a 56 y.o. year old male presented with left facial droop, slurred speech and LUE weakness on 07/20/2020 with stroke work-up revealing multifocal infarcts, largest at right pontine, likely related to recent cardiac surgery (s/p quadruple CABG 2/9) although other cardioembolic sources cannot be ruled out.  Vascular risk factors include HTN, HLD, CAD s/p CABG 06/2020, COVID-19 infection 05/2020, former tobacco use and severe OSA now on CPAP. S/p left shoulder rotator cuff repair, manipulation for adhesive capsulitis and subacromial decompression 04/12/2021 by Dr. Linton Rump     PLAN:  Worsening stroke deficits: Present over the past 2 to 3 weeks Worsening LUE weakness, facial weakness with increased drooling, possible worsening of dysphagia, cognition and anxiety. Also noted severe right occipital headache 2 weeks ago lasting a couple minutes.  Obtain MR brain wo contrast to rule out new stroke or extension of prior stroke Offered SLP with MBS and OT but he declines interest at this time.  Discussed swallowing precautions and would highly recommend pursuing MBS if he continues to experience choking   Multifocal infarcts:  Residual deficit: mild LUE weakness with decreased hand dexterity, cognitive impairment (high level executive functioning and inattention), left facial weakness and gait impairment with imbalance.   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:  Initiate amitriptyline 25 mg nightly -advised to call after 3-4 weeks if no benefit or sooner if difficulty tolerating If no benefit or unable to tolerate amitriptyline, can trial use of fluoxetine Intolerant to Nuedexta Continue to assist with disability as he pursues Social Security disability due to residual deficits as noted above Cardiac event monitor negative for atrial fibrillation Continue aspirin 81 mg daily, Plavix and atorvastatin for secondary stroke prevention and per cardiology  recommendations Discussed secondary stroke prevention measures and importance of close PCP follow up for aggressive stroke risk factor management including BP goal<130/90, and HLD with LDL goal<70   Severe OSA: not discussed today due to time constraints. Advised continued nightly use and will discuss at follow up visit     Follow up in 4 months or call earlier if needed   CC:  PCP: Gara Kroner, DO    I spent 39 minutes of face-to-face and non-face-to-face time with patient and wife.  This included previsit chart review, lab review, study review, order entry, electronic health record documentation, and patient and wife education and discussion  regarding prior stroke with residual deficits and recent worsening of stroke deficits, anxiety/depression and pseudobulbar affect concerns and treatment options, secondary stroke prevention measures and aggressive stroke risk factor management,  and answered all other questions to patient and wife's satisfaction  Frann Rider, AGNP-BC  [facility] [address] [place], [place] [address]  Phone [phone] Fax [phone] Note: This document was prepared with digital dictation and possible smart phrase technology. Any transcriptional errors that result from this process are unintentional.

## 2021-12-13 ENCOUNTER — Telehealth: Payer: Self-pay | Admitting: Adult Health

## 2021-12-13 NOTE — Telephone Encounter (Signed)
[employer] Berkley Harvey: [id number] exp. 12/13/21-01/11/22 sent to [facility] for open MRI

## 2021-12-27 ENCOUNTER — Telehealth: Payer: Self-pay

## 2021-12-27 ENCOUNTER — Telehealth: Payer: Self-pay | Admitting: Adult Health

## 2021-12-27 DIAGNOSIS — I671 Cerebral aneurysm, nonruptured: Secondary | ICD-10-CM

## 2021-12-27 NOTE — Telephone Encounter (Signed)
MRI head received via fax. Placed on NP desk for review

## 2021-12-27 NOTE — Addendum Note (Signed)
Addended by: Ihor Austin L on: 12/27/2021 02:26 PM   Modules accepted: Orders

## 2021-12-27 NOTE — Telephone Encounter (Signed)
[id number] exp. 12/27/21-01/25/22

## 2021-12-27 NOTE — Telephone Encounter (Signed)
Recent MRI brain results completed through [facility] on 12/23/2021   MRI HEAD WO CONTRAST IMPRESSION:   1.   No acute intracranial abnormality. No evidence of acute infarct.   2.  Small old appearing infarcts within the pons, right thalamus, left cerebral white matter, and left posterior body of the corpus callosum.   3.  A few scattered small T2 FLAIR hyperintensity within the periventricular and subcortical white matter of the cerebral hemispheres. These lesions are nonspecific, usually resulting from benign/remote/incidental causes (e.g. prior trauma/inflammation/demyelination, or chronic ischemia associated with migraine/atherosclerosis/other vasculopathies).   4.  Possible tiny outpouching projecting at the basilar tip on coronal T2 imaging, this could be related to CSF flow artifact, however recommend a MRA of the head without contrast to exclude tiny aneurysm at this site.     Unable to view actual imaging through epic or through [facility]. Per recent results, no evidence of acute findings or new infarcts compared to prior imaging. Did note possible tiny outpouching of basilar tip, will complete CTA for further evaluation (prior CTA 06/2020 noted normal basilar artery).    Please advise patient of these results as well as recommended CTA head.  If he would like to proceed with therapy at this time or pursue swallowing evaluation, please let me know and orders will be placed.  Continue current medication regimen for secondary stroke prevention measures and continue close PCP follow-up for aggressive stroke risk factor management

## 2021-12-30 ENCOUNTER — Other Ambulatory Visit: Payer: Self-pay | Admitting: Adult Health

## 2021-12-30 DIAGNOSIS — F482 Pseudobulbar affect: Secondary | ICD-10-CM

## 2022-01-28 ENCOUNTER — Ambulatory Visit
Admission: RE | Admit: 2022-01-28 | Discharge: 2022-01-28 | Disposition: A | Discharge status: Home / Self Care | Payer: BC Managed Care – PPO | Source: Ambulatory Visit | Attending: Adult Health | Admitting: Adult Health

## 2022-01-28 DIAGNOSIS — I671 Cerebral aneurysm, nonruptured: Secondary | ICD-10-CM

## 2022-01-28 MED ORDER — IOPAMIDOL (ISOVUE-370) INJECTION 76%
75.0000 mL | Freq: Once | INTRAVENOUS | Status: AC | PRN
Start: 2022-01-28 — End: 2022-01-28
  Administered 2022-01-28: 75 mL via INTRAVENOUS

## 2022-02-28 ENCOUNTER — Telehealth: Payer: Self-pay | Admitting: Adult Health

## 2022-02-28 NOTE — Telephone Encounter (Signed)
LVM and sent mychart msg informing pt of need to reschedule 11/22 appointment - NP out 

## 2022-04-05 ENCOUNTER — Other Ambulatory Visit: Payer: Self-pay | Admitting: Cardiology

## 2022-04-05 DIAGNOSIS — I25810 Atherosclerosis of coronary artery bypass graft(s) without angina pectoris: Secondary | ICD-10-CM

## 2022-04-19 NOTE — Progress Notes (Unsigned)
[facility] [address] [place] [address] [phone]       OFFICE FOLLOW UP NOTE  [name] Date of Birth:  May 24, 1966 Medical Record Number:  [id number]   Reason for visit: stroke f/u    SUBJECTIVE:   CHIEF COMPLAINT:  No chief complaint on file.     HPI:   Update 04/20/2022 JM: Patient Donovan for 27-monthfollow-up.  Prior visit, increased left hand weakness, worsening dysphagia and worsening cognition.  Completed MRI brain which did not show any acute findings or new findings compared to prior imaging, did mention possible outpouching of basilar tip vs artifact, completed CTA head which did not show any evidence of aneurysms or abnormal findings.  Denies any new or worsening stroke/TIA symptoms since prior visit. He has remained on both aspirin and Plavix as well as atorvastatin Blood pressure well controlled Closely follows with PCP Dr. TRadford Pax      History provided for reference purposes only Update 12/07/2021 JM: Patient Donovan for 633-monthtroke follow-up accompanied by his wife. Reports over the past 2-3 weeks, patient has had more difficulty holding objects with left hand, increased drooling and more episodes of choking (usually with eating/drinking but can also be on his saliva). Wife also notes possible worsening of concentration and forgetfulness and worsening anxiety/irritability with panic attacks  as well as periods of inappropriate laughing or crying.  Slurred speech still present and can worsen with fatigue which is his baseline. Continued dizziness and imbalance at baseline. Occasional falls still but has been less compared to prior visit, still without any significant injury.  Patient does note experiencing a severe right occipital headache at night about 2 weeks ago, described as a sharp stabbing pain that lasted about 2 minutes then resolved. He informed his wife the next morning. He had a mild headache a couple  days later in the same area but nothing since that time.   Reports compliance on aspirin and Plavix without side effects. Of note, did hold plavix for scheduled colonoscopy 5 days prior to procedure on 6/16 but unable to complete colonoscopy as he was not "cleared out enough". He did remain on aspirin during that duration.  Compliant on atorvastatin, denies side effects.  Blood pressure today 135/96.  Did have some BP med adjustment back in May and has been stable since that time, denies any low BP readings at home. Reports continued nightly CPAP compliance. Did not discuss fully today due to time constraint.  No further concerns at this time.  Update 06/09/2021 JMPT:WSFKCLEeturns for initial CPAP compliance visit and stroke follow up. Previously seen 4 months ago for stroke f/u. Completed HST 02/24/2021 which showed severe OSA with strong REM sleep component with total AHI 53.2/h and recommended initiating AutoPap which was started on 03/25/2021.  Compliance report from 05/09/2021 -06/07/2021 shows 24 out of 30 usage days for 80% compliance with 20 days greater than 4 hours per 67% compliance.  Average usage 5 hours and 18 minutes.  Residual AHI 10.7.  Leaks in the 95th percentile 5.8 (max 41.9).  Pressure in the 95th percentile 12.6 with mean pressure 6 and max pressure 18 and EPR level 3. Has difficulty tolerating pressure at times as he feels he cannot breathe and also c/o leaks around his mask. Does notice some improvement of daytime fatigue and sleeping better at night.  Otherwise tolerating mask well.  Current use of full facemask.  Epworth Sleepiness Scale 9.  Stable from stroke standpoint -  residual mild LUE weakness, cognitive impairment and gait impairment with imbalance stable. Does note some improvement of LUE strength especially left hand since s/p left rotator cuff repair and procedure for adhesive capsulitis and subacromial decompression of left shoulder on 04/12/2021 by Dr. Rojelio Brenner. Working with  PT with gradual improvement. Gradually trying to drive - still having occultly with inattention. Recurrent falls thankfully without severe injury or hitting head - usually on uneven ground (walking in the woods). Tried Nuedexta for suspected PBA - felt this caused worsening depression. Still occasionally laughing at inappropriate times but more so seems to be heightened emotions. Remains on disability - in the process of applying for Social Security disability.  Denies new stroke/TIA symptoms.  Compliant on aspirin, Plavix, atorvastatin and Zetia without side effects.  Blood pressure today 155/95.  Routinely follows with PCP and cardiology.  No new concerns at this time.   Update 02/16/2021 JM: Leonard Donovan for 59-monthstroke follow-up accompanied by his wife, Leonard Donovan  Overall doing well.  Completed SLP 8/16 as he met all goals and only rare word finding difficulty and occasional deviations in attention and reduced awareness.  Left-sided weakness with some improvement since prior visit.  Followed by orthopedics for adhesive capsulitis and currently working with PT noting some improvement. Continued occassional imbalance and occasional swallowing difficulties but seems to be more related to inattention.  He has not previously worked with PT for imbalance as he was previously working with PT for cardiac rehab. Per wife, continued uncontrolled crying and occasional uncontrolled laughing -previously discussed Nuedexta for PBA -declined interest at that time but now interested in trialing.  Remains on disability previously working in [employer]  Denies new stroke/TIA symptoms.  Remains on aspirin and Plavix as well as atorvastatin and zetia without side effects.  Blood pressure today 155/98. Routinely monitors at home - cardiology recently increased BP meds last week.  Evaluated by Dr. DBrett Fairyfor concern of possible sleep apnea and plans on completing HST tonight.  No further concerns at this time  Update  11/24/2020 JM: Mr. MCoddreturns for 34-monthtroke follow-up accompanied by his wife.  Patient reports residual left-sided weakness/pain, occasional imbalance and fatigue but overall improving.  Currently working with OT for residual left-sided weakness and shoulder pain -currently being followed by orthopedics with MRI completed yesterday and has scheduled visit next week for further evaluation due to continued shoulder pain interfering with therapy sessions as well as sleep.  Also continues to work with SLP for aphasia, dysarthria and cognitive communication deficit.  Completed MBS 4/5 which was normal although he does have occasional difficulty swallowing water.  Denies new stroke/TIA symptoms. Remains on aspirin and Plavix as well as atorvastatin and zetia without associated side effects.  Blood pressure today 125/83. Completed 30-day cardiac event monitor which was negative for atrial fibrillation. He does report daytime fatigue as well as insomnia -has not previously underwent sleep study.  Wife is concerned regarding labile emotions where he may laugh or cry uncontrollably during situations that may not be that funny or sad.  No further concerns at this time.  Initial visit 08/24/2020 JM: Leonard Donovan being seen for hospital follow-up accompanied by his wife  Reports residual left-sided weakness, left facial droop, swallowing difficulties and cognitive difficulties Evaluated by [place] SLP 3/7 - noted mild cognitive linguistic deficits; no concerns of aspiration -personally reviewed evaluation note -no further SLP recommended as patient and wife reported that he was at baseline At todays visit, wife reports  occasional confusion and delayed processing which patient agrees with Report of swallowing difficulties only with water otherwise denies difficulty Denies new stroke/TIA symptoms He has not returned back to work working in [employer] due to recent MI  Compliant on aspirin and  Plavix -denies associated side effects Compliant on atorvastatin 80 mg daily -denies associated side effects Blood pressure today 186/112 -shortly after discharge, evaluated by cardiology for syncopal episode which was felt likely due to orthostatic hypotension - cards d/c'd carvedilol and losartan and has since been slowly restarting BP regimen. Has cards f/u on Wednesday Currently wearing cardiac monitor which will be completed on 4/3  No further concerns at this time  Stroke admission 07/20/2020 Leonard Donovan is a 56 y.o. male with history of hypertension, coronary artery disease (s/p quadruple CABG on 07/08/2020), glaucoma, COVID-19 infection (sx on 06/07/2020, dx on 06/18/2020),  who presented on 07/20/2020 with left sided facial droop, slurred speech, and left upper extremity weakness.   Personally reviewed hospitalization pertinent progress notes, lab work and imaging with summary provided.  Evaluated by Dr. Erlinda Hong with stroke work-up revealing multifocal infarcts, largest at right pontine, likely related to recent cardiac surgery however other cardioembolic source cannot be ruled out.  Recommended 30-day cardiac event monitor outpatient to rule out A. fib.  On DAPT PTA and recommended continuation at discharge per cardiology recommendations s/p CABG 07/08/2020.  LDL 49 on atorvastatin 80 mg daily.  Other stroke risk factors include former tobacco use, obesity and suspected OSA.  Residual deficit of mild left facial droop, left hemiparesis and moderate cognitive deficits.  Stroke:  Multifocal infarcts, largest at right pontine, likely related to recent cardiac surgery. However, other cardioembolic source can not rule out Code Stroke CT head: Small focus of hypodensity in the right white matter. CTA head & neck: no intracranial arterial occlusion or high-grade stenosis. Bilateral carotid bifurcation atherosclerosis, right more than left MRI Acute right pontine infarct in addition to multiple small acute  to early subacute infarcts within the bilateral frontal and parietal white matter, consistent with a central (cardiac or aortic) embolic source. No hemorrhage or mass effect. Recommend 30 day monitor to evaluate for atrial fibrillation as the source for stroke 2D Echo: EF 60 to 65% LDL 49 HgbA1c 5.6 VTE prophylaxis - Lovenox 60m daily Swallow eval: passed for heart healthy diet aspirin 81 mg daily and Plavix prior to admission, continue aspirin 81 mg daily and clopidogrel 75 mg daily DAPT on discharge. Therapy recommendations:  outpt SLP Disposition: home         ROS:   14 system review of systems performed and negative with exception of those listed in HPI  PMH:  Past Medical History:  Diagnosis Date   CAD (coronary artery disease) of bypass graft    CABG x 4   Glaucoma    H/O: CVA (cerebrovascular accident) 2022   Hyperlipidemia LDL goal <70    Hypertension     PSH:  Past Surgical History:  Procedure Laterality Date   CORONARY ARTERY BYPASS GRAFT N/A 07/08/2020   Procedure: CORONARY ARTERY BYPASS GRAFTING (CABG), ON PUMP, TIMES FOUR, LEFT INTERNAL MAMMARY ARTERY TO LAD, RIGHT SVG TO PDA, DISTAL CIRCUMFLEX, AND OM1;  Surgeon: GGrace Isaac MD;  [place] [facility]  Service: Open Heart Surgery;  Laterality: N/A;   ENDOVEIN HARVEST OF GREATER SAPHENOUS VEIN Right 07/08/2020   Procedure: ENDOVEIN HARVEST OF RIGHT GREATER SAPHENOUS VEIN;  Surgeon: GGrace Isaac MD;  [place] [facility]  Service: Open Heart Surgery;  Laterality: Right;   LEFT HEART CATH AND CORONARY ANGIOGRAPHY N/A 07/02/2020   Procedure: LEFT HEART CATH AND CORONARY ANGIOGRAPHY;  Surgeon: Jettie Booze, MD;  Location: [facility];  Service: Cardiovascular;  Laterality: N/A;   NO PAST SURGERIES     SHOULDER ARTHROSCOPY  04/12/2021   TEE WITHOUT CARDIOVERSION N/A 07/08/2020   Procedure: TRANSESOPHAGEAL ECHOCARDIOGRAM (TEE);  Surgeon: Grace Isaac, MD;  Location: [facility];  Service: Open  Heart Surgery;  Laterality: N/A;    Social History:  Social History   Socioeconomic History   Marital status: Married    Spouse name: Designer, jewellery   Number of children: Not on file   Years of education: Not on file   Highest education level: Not on file  Occupational History   Not on file  Tobacco Use   Smoking status: Former   Smokeless tobacco: Former  Scientific laboratory technician Use: Never used  Substance and Sexual Activity   Alcohol use: Not Currently   Drug use: Not Currently   Sexual activity: Not on file  Other Topics Concern   Not on file  Social History Narrative   Lives with wife   Social Determinants of Health   Financial Resource Strain: Not on file  Food Insecurity: Not on file  Transportation Needs: Not on file  Physical Activity: Not on file  Stress: Not on file  Social Connections: Not on file  Intimate Partner Violence: Not on file    Family History:  Family History  Problem Relation Age of Onset   Hypertension Father    CAD Sister        diagnosed in her 43s   Heart attack Sister     Medications:   Current Outpatient Medications on File Prior to Visit  Medication Sig Dispense Refill   acetaminophen (TYLENOL) 500 MG tablet Take 2 tablets (1,000 mg total) by mouth every 8 (eight) hours as needed for mild pain (or discomfort). 30 tablet 0   amitriptyline (ELAVIL) 25 MG tablet TAKE 1 TABLET BY MOUTH EVERYDAY AT BEDTIME 90 tablet 2   amLODipine (NORVASC) 10 MG tablet Take 1 tablet (10 mg total) by mouth daily. 180 tablet 3   aspirin EC 81 MG EC tablet Take 1 tablet (81 mg total) by mouth daily. Swallow whole. 30 tablet 11   atorvastatin (LIPITOR) 80 MG tablet TAKE 1 TABLET BY MOUTH EVERY DAY 90 tablet 3   carvedilol (COREG) 6.25 MG tablet Take 1 tablet (6.25 mg total) by mouth 2 (two) times daily. 180 tablet 3   clopidogrel (PLAVIX) 75 MG tablet TAKE 1 TABLET BY MOUTH EVERY DAY 90 tablet 3   ezetimibe (ZETIA) 10 MG tablet Take 1 tablet (10 mg total) by mouth  daily. 90 tablet 3   Multiple Vitamins-Minerals (MULTIVITAMIN WITH MINERALS) tablet Take 1 tablet by mouth daily.     nitroGLYCERIN (NITROSTAT) 0.4 MG SL tablet Place 1 tablet (0.4 mg total) under the tongue every 5 (five) minutes as needed. 25 tablet 3   valsartan (DIOVAN) 320 MG tablet Take 1 tablet (320 mg total) by mouth daily. 90 tablet 3   No current facility-administered medications on file prior to visit.    Allergies:  No Known Allergies    OBJECTIVE:  Physical Exam  There were no vitals filed for this visit.  There is no height or weight on file to calculate BMI. No results found.  General: well developed, well nourished, very pleasant  middle-age Caucasian male, seated, in no evident distress Head: head normocephalic and atraumatic.   Neck: supple with no carotid or supraclavicular bruits Cardiovascular: regular rate and rhythm, no murmurs Musculoskeletal: no deformity Skin:  no rash/petichiae Vascular:  Normal pulses all extremities   Neurologic Exam Mental Status: Awake and fully alert. Occasional word finding difficulty.  Follows commands without difficulty.  No evidence of dysarthria.  Oriented to place and time. Recent and remote memory intact. Attention span, concentration and fund of knowledge fluctuated during visit. Mood and affect appropriate.   Cranial Nerves: Pupils equal, briskly reactive to light. Extraocular movements full without nystagmus. Visual fields full to confrontation. Hearing intact. Facial sensation intact.  Moderate left lower facial weakness.  Tongue and palate moves normally and symmetrically.  Motor: Normal bulk and tone and strength right upper and lower extremity LUE: 4/5 deltoid and elbow extension; 4-/5 elbow flexion and handgrip.  Limited L shoulder ROM and decreased finger dexterity and grip strength LLE: 5/5 Sensory.: intact to touch , pinprick , position and vibratory sensation.  Coordination: Rapid alternating movements normal in  all extremities except slightly decreased left hand. Finger-to-nose slightly impaired LUE and heel-to-shin performed accurately bilaterally.  Orbits right arm over left arm. Gait and Station: Arises from chair without difficulty. Stance is normal. Gait demonstrates normal stride length with mild imbalance without use of assistive device. Tandem walk and heel toe not attempted Reflexes: 1+ and symmetric. Toes downgoing.        ASSESSMENT: Leonard Donovan is a 56 y.o. year old male presented with left facial droop, slurred speech and LUE weakness on 07/20/2020 with stroke work-up revealing multifocal infarcts, largest at right pontine, likely related to recent cardiac surgery (s/p quadruple CABG 2/9) although other cardioembolic sources cannot be ruled out.  Vascular risk factors include HTN, HLD, CAD s/p CABG 06/2020, COVID-19 infection 05/2020, former tobacco use and severe OSA now on CPAP. S/p left shoulder rotator cuff repair, manipulation for adhesive capsulitis and subacromial decompression 04/12/2021 by Dr. Linton Rump     PLAN:  Worsening stroke deficits: MRI brain negative for acute findings or new findings compared to prior imaging CTA unremarkable  Present over the past 2 to 3 weeks Worsening LUE weakness, facial weakness with increased drooling, possible worsening of dysphagia, cognition and anxiety. Also noted severe right occipital headache 2 weeks ago lasting a couple minutes.  Obtain MR brain wo contrast to rule out new stroke or extension of prior stroke Offered SLP with MBS and OT but he declines interest at this time.  Discussed swallowing precautions and would highly recommend pursuing MBS if he continues to experience choking   Multifocal infarcts:  Residual deficit: mild LUE weakness with decreased hand dexterity, cognitive impairment (high level executive functioning and inattention), left facial weakness and gait impairment with imbalance.   Emotional lability with anxiety and  pseudobulbar affect:  Initiate amitriptyline 25 mg nightly -advised to call after 3-4 weeks if no benefit or sooner if difficulty tolerating If no benefit or unable to tolerate amitriptyline, can trial use of fluoxetine Intolerant to Nuedexta Continue to assist with disability as he pursues Social Security disability due to residual deficits as noted above Cardiac event monitor negative for atrial fibrillation Continue aspirin 81 mg daily, Plavix and atorvastatin for secondary stroke prevention and per cardiology recommendations Discussed secondary stroke prevention measures and importance of close PCP follow up for aggressive stroke risk factor management including BP goal<130/90, and HLD with LDL goal<70   Severe OSA: not  discussed today due to time constraints. Advised continued nightly use and will discuss at follow up visit     Follow up in 4 months or call earlier if needed   CC:  PCP: Gara Kroner, DO    I spent 39 minutes of face-to-face and non-face-to-face time with patient and wife.  This included previsit chart review, lab review, study review, order entry, electronic health record documentation, and patient and wife education and discussion regarding prior stroke with residual deficits and recent worsening of stroke deficits, anxiety/depression and pseudobulbar affect concerns and treatment options, secondary stroke prevention measures and aggressive stroke risk factor management,  and answered all other questions to patient and wife's satisfaction  Frann Rider, AGNP-BC  [facility] [address] [place], [place] [address]  Phone [phone] Fax [phone] Note: This document was prepared with digital dictation and possible smart phrase technology. Any transcriptional errors that result from this process are unintentional.

## 2022-04-20 ENCOUNTER — Ambulatory Visit: Payer: BC Managed Care – PPO | Admitting: Adult Health

## 2022-04-20 ENCOUNTER — Encounter: Payer: Self-pay | Admitting: Adult Health

## 2022-04-20 VITALS — BP 148/96 | HR 68 | Ht 69.0 in | Wt 243.0 lb

## 2022-04-20 DIAGNOSIS — I639 Cerebral infarction, unspecified: Secondary | ICD-10-CM

## 2022-04-20 DIAGNOSIS — F482 Pseudobulbar affect: Secondary | ICD-10-CM

## 2022-04-20 DIAGNOSIS — G4733 Obstructive sleep apnea (adult) (pediatric): Secondary | ICD-10-CM | POA: Diagnosis not present

## 2022-04-20 DIAGNOSIS — Z9989 Dependence on other enabling machines and devices: Secondary | ICD-10-CM | POA: Diagnosis not present

## 2022-04-20 NOTE — Patient Instructions (Signed)
   Continue aspirin 81 mg daily and clopidogrel 75 mg daily  and atorvastatin  for secondary stroke prevention  Continue to follow up with PCP regarding cholesterol and blood pressure management  Maintain strict control of hypertension with blood pressure goal below 130/90 and cholesterol with LDL cholesterol (bad cholesterol) goal below 70 mg/dL.   Signs of a Stroke? Follow the BEFAST method:  Balance Watch for a sudden loss of balance, trouble with coordination or vertigo Eyes Is there a sudden loss of vision in one or both eyes? Or double vision?  Face: Ask the person to smile. Does one side of the face droop or is it numb?  Arms: Ask the person to raise both arms. Does one arm drift downward? Is there weakness or numbness of a leg? Speech: Ask the person to repeat a simple phrase. Does the speech sound slurred/strange? Is the person confused ? Time: If you observe any of these signs, call 911.     Followup in the future with me in 9 months or call earlier if needed      Thank you for coming to see [place] at [facility]. I hope we have been able to provide you high quality care today.  You may receive a patient satisfaction survey over the next few weeks. We would appreciate your feedback and comments so that we may continue to improve ourselves and the health of our patients.

## 2022-04-25 NOTE — Progress Notes (Signed)
HPI: Follow-up CAD.  Patient ruled in for non-ST elevation myocardial infarction February 2022.  Cardiac catheterization revealed severe three-vessel coronary artery disease.  Echocardiogram showed normal LV function.  Patient had coronary artery bypass graft with LIMA to the LAD, saphenous vein graft to the PDA, distal circumflex and OM1.  Note preoperative carotid Dopplers showed 40 to 59% right carotid artery stenosis.  Following his discharge he returned due to slurred speech and left upper extremity weakness and was found to have CVA.  Repeat echocardiogram showed normal LV function.  Outpatient monitor April 2022 showed sinus rhythm with no atrial fibrillation.  Following his discharge he was seen in the office with 3 episodes of syncope that was felt likely to be orthostatic mediated.  He had 1 in the office and systolic blood pressure was in the 80s.  His medications were discontinued.  We have gradually increased his medications as an outpatient.  Carotid Dopplers May 2023 showed 1 to 39% bilateral stenosis.  Since last seen there is no dyspnea, chest pain, palpitations or syncope.  Current Outpatient Medications  Medication Sig Dispense Refill   acetaminophen (TYLENOL) 500 MG tablet Take 2 tablets (1,000 mg total) by mouth every 8 (eight) hours as needed for mild pain (or discomfort). 30 tablet 0   amitriptyline (ELAVIL) 25 MG tablet TAKE 1 TABLET BY MOUTH EVERYDAY AT BEDTIME 90 tablet 2   amLODipine (NORVASC) 10 MG tablet Take 1 tablet (10 mg total) by mouth daily. 180 tablet 3   aspirin EC 81 MG EC tablet Take 1 tablet (81 mg total) by mouth daily. Swallow whole. 30 tablet 11   atorvastatin (LIPITOR) 80 MG tablet TAKE 1 TABLET BY MOUTH EVERY DAY 90 tablet 3   carvedilol (COREG) 6.25 MG tablet Take 1 tablet (6.25 mg total) by mouth 2 (two) times daily. 180 tablet 3   clopidogrel (PLAVIX) 75 MG tablet TAKE 1 TABLET BY MOUTH EVERY DAY 90 tablet 3   ezetimibe (ZETIA) 10 MG tablet Take 1  tablet (10 mg total) by mouth daily. 90 tablet 3   Multiple Vitamins-Minerals (MULTIVITAMIN WITH MINERALS) tablet Take 1 tablet by mouth daily.     nitroGLYCERIN (NITROSTAT) 0.4 MG SL tablet Place 1 tablet (0.4 mg total) under the tongue every 5 (five) minutes as needed. 25 tablet 3   valsartan (DIOVAN) 320 MG tablet Take 1 tablet (320 mg total) by mouth daily. 90 tablet 3   No current facility-administered medications for this visit.     Past Medical History:  Diagnosis Date   CAD (coronary artery disease) of bypass graft    CABG x 4   Glaucoma    H/O: CVA (cerebrovascular accident) 2022   Hyperlipidemia LDL goal <70    Hypertension     Past Surgical History:  Procedure Laterality Date   CORONARY ARTERY BYPASS GRAFT N/A 07/08/2020   Procedure: CORONARY ARTERY BYPASS GRAFTING (CABG), ON PUMP, TIMES FOUR, LEFT INTERNAL MAMMARY ARTERY TO LAD, RIGHT SVG TO PDA, DISTAL CIRCUMFLEX, AND OM1;  Surgeon: Delight Ovens, MD;  Location: [facility] OR;  Service: Open Heart Surgery;  Laterality: N/A;   ENDOVEIN HARVEST OF GREATER SAPHENOUS VEIN Right 07/08/2020   Procedure: ENDOVEIN HARVEST OF RIGHT GREATER SAPHENOUS VEIN;  Surgeon: Delight Ovens, MD;  Location: [facility] OR;  Service: Open Heart Surgery;  Laterality: Right;   LEFT HEART CATH AND CORONARY ANGIOGRAPHY N/A 07/02/2020   Procedure: LEFT HEART CATH AND CORONARY ANGIOGRAPHY;  Surgeon: Corky Crafts, MD;  Location: [facility] INVASIVE CV LAB;  Service: Cardiovascular;  Laterality: N/A;   NO PAST SURGERIES     SHOULDER ARTHROSCOPY  04/12/2021   TEE WITHOUT CARDIOVERSION N/A 07/08/2020   Procedure: TRANSESOPHAGEAL ECHOCARDIOGRAM (TEE);  Surgeon: Delight Ovens, MD;  Location: [facility] OR;  Service: Open Heart Surgery;  Laterality: N/A;    Social History   Socioeconomic History   Marital status: Married    Spouse name: Albin Felling   Number of children: Not on file   Years of education: Not on file   Highest education level: Not on file   Occupational History   Not on file  Tobacco Use   Smoking status: Former   Smokeless tobacco: Former  Building services engineer Use: Never used  Substance and Sexual Activity   Alcohol use: Not Currently   Drug use: Not Currently   Sexual activity: Not on file  Other Topics Concern   Not on file  Social History Narrative   Lives with wife   Social Determinants of Health   Financial Resource Strain: Not on file  Food Insecurity: Not on file  Transportation Needs: Not on file  Physical Activity: Not on file  Stress: Not on file  Social Connections: Not on file  Intimate Partner Violence: Not on file    Family History  Problem Relation Age of Onset   Hypertension Father    CAD Sister        diagnosed in her 7s   Heart attack Sister     ROS: no fevers or chills, productive cough, hemoptysis, dysphasia, odynophagia, melena, hematochezia, dysuria, hematuria, rash, seizure activity, orthopnea, PND, pedal edema, claudication. Remaining systems are negative.  Physical Exam: Well-developed well-nourished in no acute distress.  Skin is warm and dry.  HEENT is normal.  Neck is supple.  Chest is clear to auscultation with normal expansion.  Cardiovascular exam is regular rate and rhythm.  Abdominal exam nontender or distended. No masses palpated. Extremities show no edema. neuro grossly intact   A/P  1 coronary artery disease-patient denies chest pain.  Continue aspirin and statin.  2 hypertension-patient's blood pressure is elevated.  Add chlorthalidone 12.5 mg daily.  In 1 week check potassium and renal function.  Follow blood pressure and advance medications as needed.  3 hyperlipidemia-continue Lipitor and Zetia.  Check lipids and liver.  4 carotid artery disease-mild on most recent Dopplers.  5 history of CVA-continue aspirin and Plavix.  This was initiated by neurology following his prior CVA.  [name], MD

## 2022-05-05 ENCOUNTER — Encounter: Payer: Self-pay | Admitting: Cardiology

## 2022-05-05 ENCOUNTER — Ambulatory Visit: Payer: BC Managed Care – PPO | Attending: Cardiology | Admitting: Cardiology

## 2022-05-05 VITALS — BP 142/94 | HR 68 | Ht 69.0 in | Wt 242.0 lb

## 2022-05-05 DIAGNOSIS — I25810 Atherosclerosis of coronary artery bypass graft(s) without angina pectoris: Secondary | ICD-10-CM | POA: Diagnosis not present

## 2022-05-05 DIAGNOSIS — I10 Essential (primary) hypertension: Secondary | ICD-10-CM | POA: Diagnosis not present

## 2022-05-05 DIAGNOSIS — I6523 Occlusion and stenosis of bilateral carotid arteries: Secondary | ICD-10-CM | POA: Diagnosis not present

## 2022-05-05 DIAGNOSIS — E7800 Pure hypercholesterolemia, unspecified: Secondary | ICD-10-CM

## 2022-05-05 MED ORDER — CHLORTHALIDONE 25 MG PO TABS: 12.5000 mg | ORAL_TABLET | Freq: Every day | ORAL | 3 refills | Status: DC

## 2022-05-05 NOTE — Patient Instructions (Signed)
Medication Instructions:   START CHLORTHALIDONE 12.5 [facility] ONCE DAILY= 1/2 OF THE 25 MG TABLET ONCE DAILY  *If you need a refill on your cardiac medications before your next appointment, please call your pharmacy*   Lab Work:  Your physician recommends that you return for lab work in: ONE WEEK-DO NOT NEED TO FAST  If you have labs (blood work) drawn today and your tests are completely normal, you will receive your results only by: MyChart Message (if you have MyChart) OR A paper copy in the mail If you have any lab test that is abnormal or we need to change your treatment, we will call you to review the results.   Follow-Up: At [facility], you and your health needs are our priority.  As part of our continuing mission to provide you with exceptional heart care, we have created designated Provider Care Teams.  These Care Teams include your primary Cardiologist (physician) and Advanced Practice Providers (APPs -  Physician Assistants and Nurse Practitioners) who all work together to provide you with the care you need, when you need it.  We recommend signing up for the patient portal called "MyChart".  Sign up information is provided on this After Visit Summary.  MyChart is used to connect with patients for Virtual Visits (Telemedicine).  Patients are able to view lab/test results, encounter notes, upcoming appointments, etc.  Non-urgent messages can be sent to your provider as well.   To learn more about what you can do with MyChart, go to [email or web address].    Your next appointment:   12 month(s)  The format for your next appointment:   In Person  Provider:   [name], MD

## 2022-05-12 ENCOUNTER — Other Ambulatory Visit: Payer: Self-pay | Admitting: Cardiology

## 2022-05-20 LAB — LIPID PANEL
Chol/HDL Ratio: 3.5 ratio (ref 0.0–5.0)
Cholesterol, Total: 117 mg/dL (ref 100–199)
HDL: 33 mg/dL — ABNORMAL LOW (ref >39)
LDL Chol Calc (NIH): 57 mg/dL (ref 0–99)
Triglycerides: 159 mg/dL — ABNORMAL HIGH (ref 0–149)
VLDL Cholesterol Cal: 27 mg/dL (ref 5–40)

## 2022-05-20 LAB — COMPREHENSIVE METABOLIC PANEL
ALT: 11 IU/L (ref 0–44)
AST: 25 IU/L (ref 0–40)
Albumin/Globulin Ratio: 2 (ref 1.2–2.2)
Albumin: 4.5 g/dL (ref 3.8–4.9)
Alkaline Phosphatase: 133 IU/L — ABNORMAL HIGH (ref 44–121)
BUN/Creatinine Ratio: 16 (ref 9–20)
BUN: 19 mg/dL (ref 6–24)
Bilirubin Total: 0.8 mg/dL (ref 0.0–1.2)
CO2: 23 mmol/L (ref 20–29)
Calcium: 9.2 mg/dL (ref 8.7–10.2)
Chloride: 101 mmol/L (ref 96–106)
Creatinine, Ser: 1.22 mg/dL (ref 0.76–1.27)
Globulin, Total: 2.3 g/dL (ref 1.5–4.5)
Glucose: 117 mg/dL — ABNORMAL HIGH (ref 70–99)
Potassium: 4.1 mmol/L (ref 3.5–5.2)
Sodium: 139 mmol/L (ref 134–144)
Total Protein: 6.8 g/dL (ref 6.0–8.5)
eGFR: 70 mL/min/{1.73_m2} (ref >59)

## 2022-05-26 ENCOUNTER — Other Ambulatory Visit: Payer: Self-pay | Admitting: Cardiology

## 2022-05-26 DIAGNOSIS — E7800 Pure hypercholesterolemia, unspecified: Secondary | ICD-10-CM

## 2022-05-31 ENCOUNTER — Other Ambulatory Visit: Payer: Self-pay | Admitting: Adult Health

## 2022-05-31 DIAGNOSIS — F482 Pseudobulbar affect: Secondary | ICD-10-CM

## 2022-06-15 ENCOUNTER — Telehealth: Payer: Self-pay | Admitting: Neurology

## 2022-06-15 NOTE — Telephone Encounter (Signed)
I have completed the 2024 paperwork for the patient and placed in Dr Leonie Man office for him to sign upon his return.

## 2022-06-20 NOTE — Telephone Encounter (Signed)
Paperwork signed and given to Hilda Blades in medical records.

## 2022-08-13 ENCOUNTER — Other Ambulatory Visit: Payer: Self-pay | Admitting: Cardiology

## 2022-10-08 ENCOUNTER — Other Ambulatory Visit: Payer: Self-pay | Admitting: Adult Health

## 2022-10-08 DIAGNOSIS — F482 Pseudobulbar affect: Secondary | ICD-10-CM

## 2022-10-24 ENCOUNTER — Other Ambulatory Visit: Payer: Self-pay | Admitting: Cardiology

## 2022-11-13 IMAGING — CR DG CHEST 2V
2 series · 2 of 2 positions shown · non-contrast
Comparison: None.

CLINICAL DATA: Chest pain.  Shortness of breath

EXAM:
CHEST - 2 VIEW

[w chest pa]
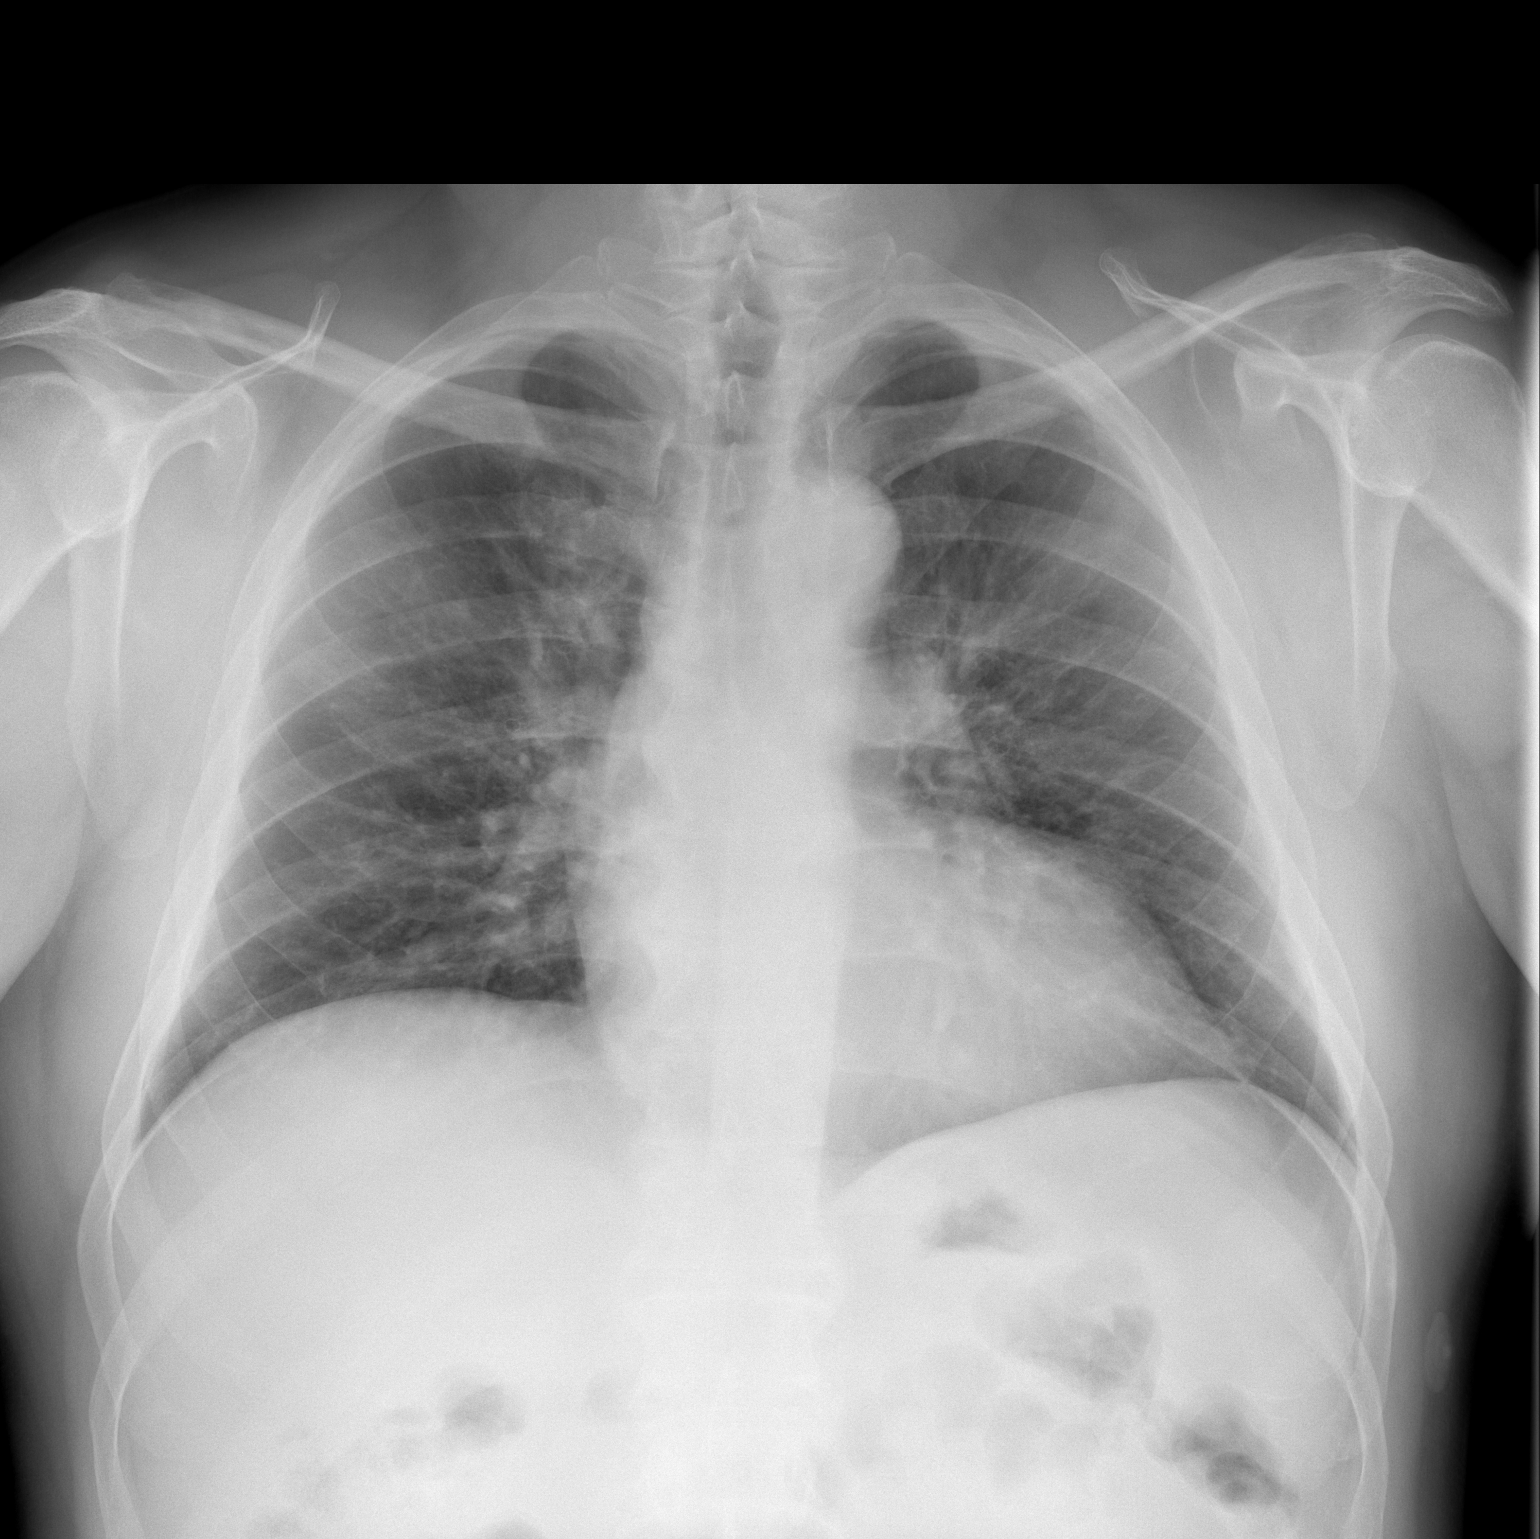

[w chest lat]
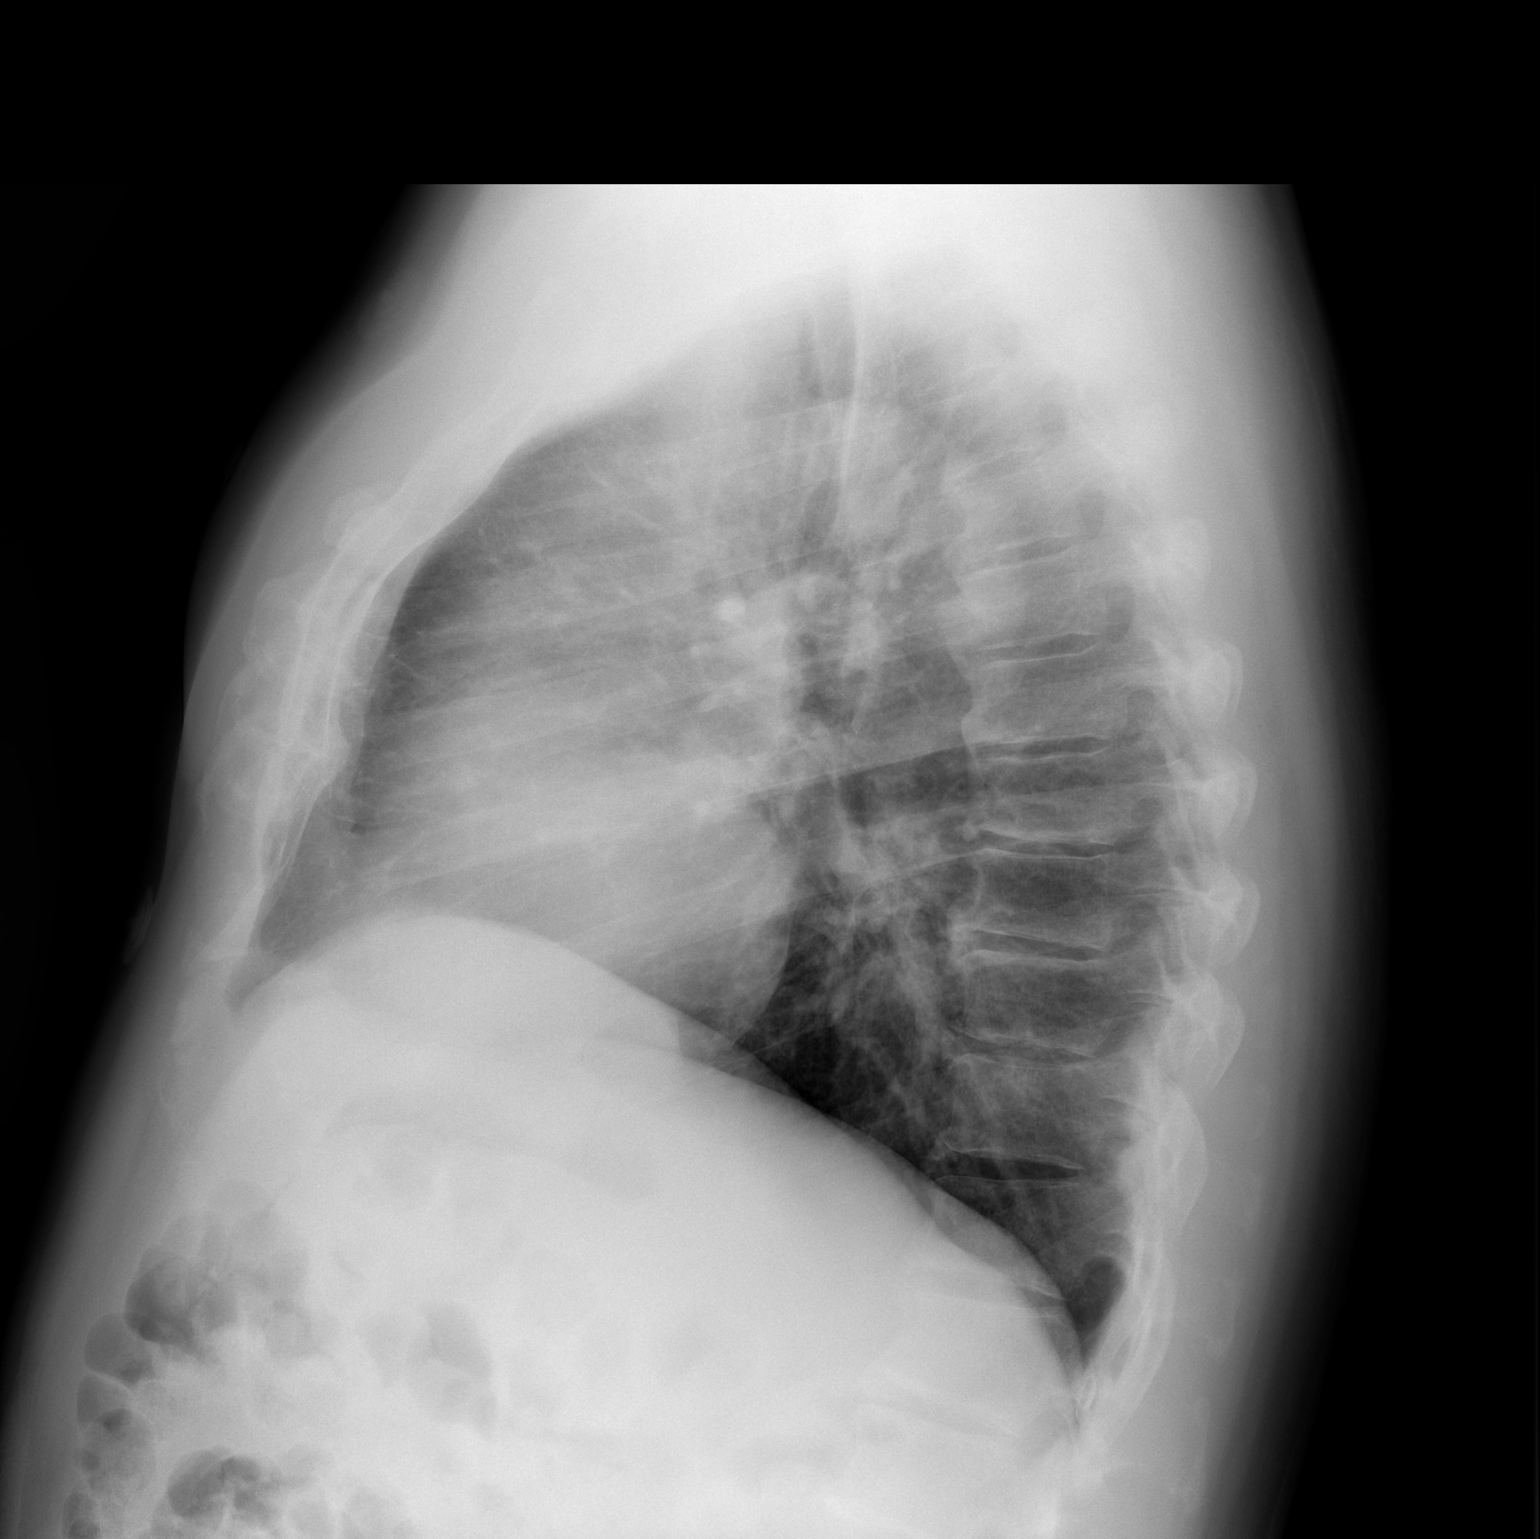

[2 of 2 positions shown; findings below may reference images not displayed]

FINDINGS: Heart size is normal. No pleural effusion or edema. Bilateral upper
lobe predominant hazy opacities are identified. Subsegmental
atelectasis noted in the left base. The visualized osseous
structures are unremarkable.
IMPRESSION: Bilateral upper lobe predominant hazy opacities concerning for
multifocal infection.

## 2022-12-02 IMAGING — CT CT HEAD W/O CM
4 series · 16 of 47 positions shown, 18 images · non-contrast
Comparison: None.

CLINICAL DATA: Left-sided weakness with slurred speech

EXAM:
CT HEAD WITHOUT CONTRAST
TECHNIQUE: Contiguous axial images were obtained from the base of the skull
through the vertex without intravenous contrast.

[Series 3: head bone · axial · 0.46mm/px · z∈[-149,-117]mm · 3 of 84 slices shown]
[im 9/84  bone]
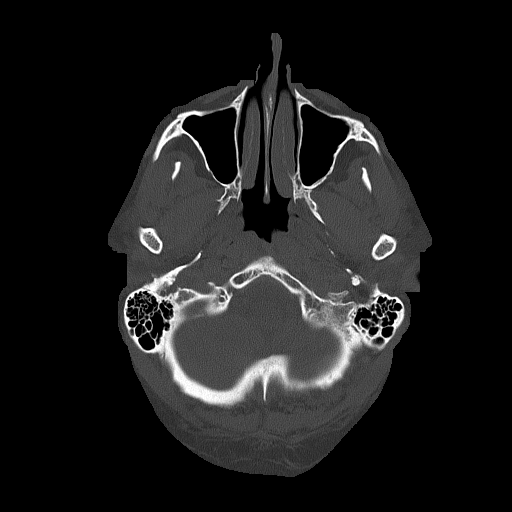
[im 17/84  bone]
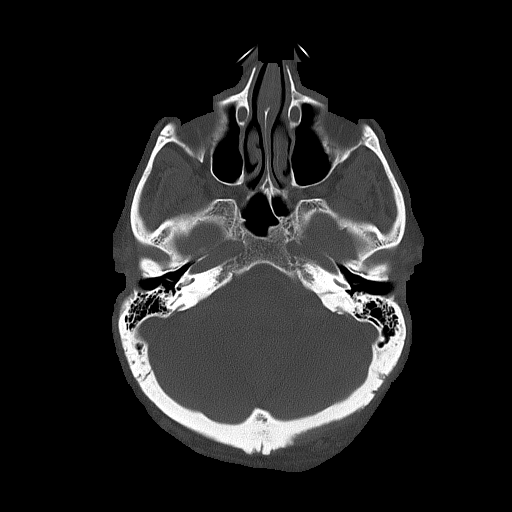
[im 25/84  bone]
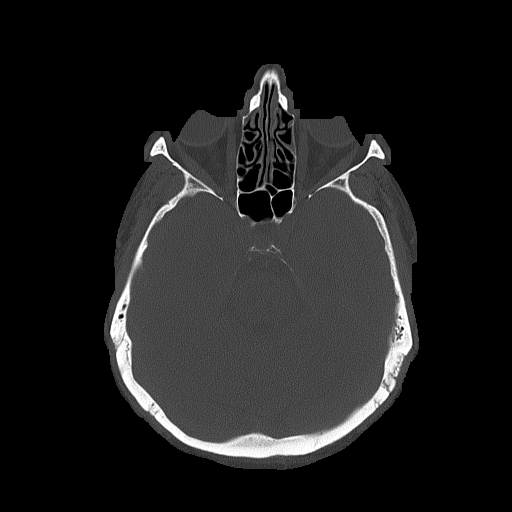

[Series 4: head without · axial · non-contrast · 0.46mm/px · z∈[-145,-25]mm · 7 of 34 slices shown, 9 images]
[im 5/34  brain]
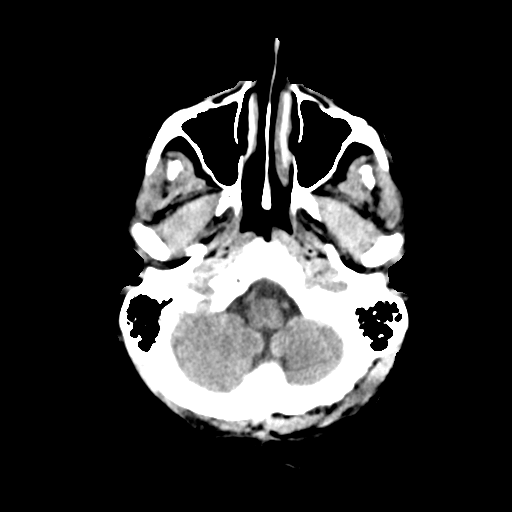
[im 5/34  bone]
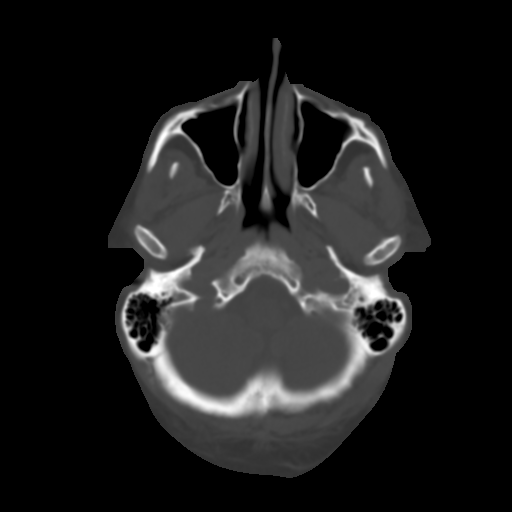
[im 9/34  brain]
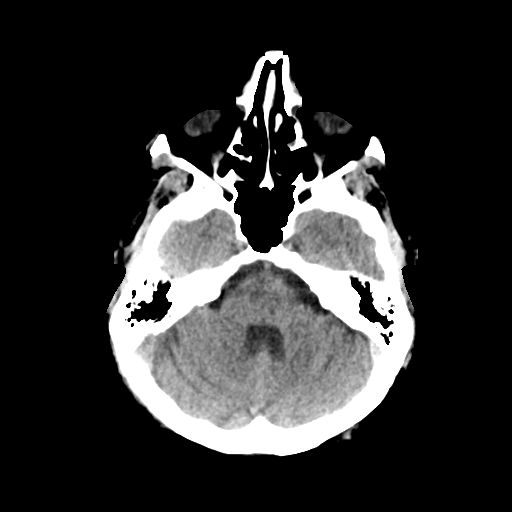
[im 13/34  brain]
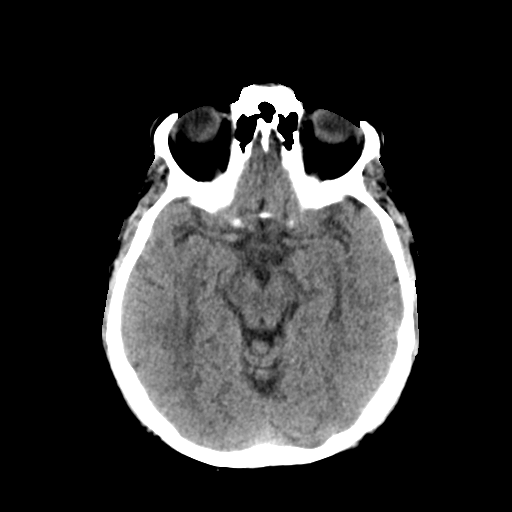
[im 17/34  brain]
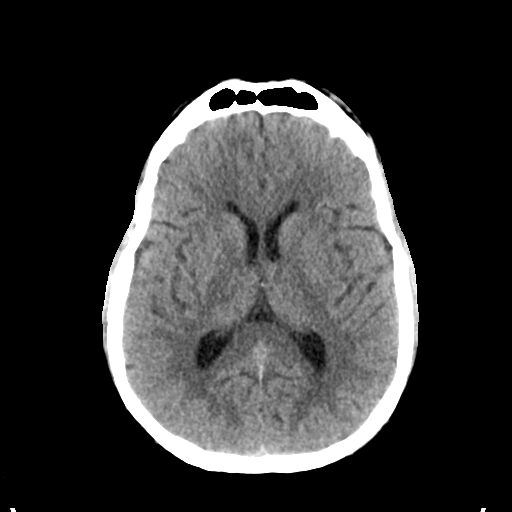
[im 21/34  brain]
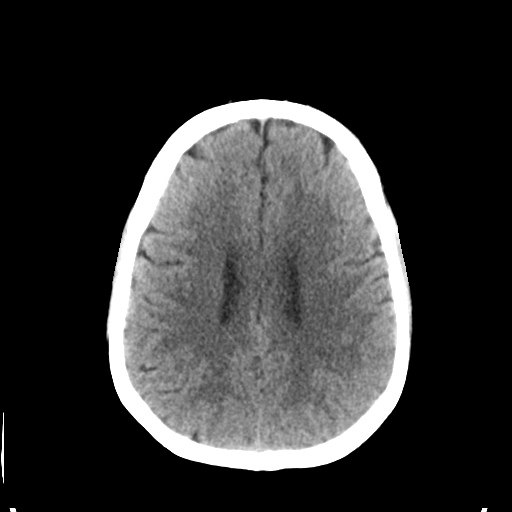
[im 21/34  bone]
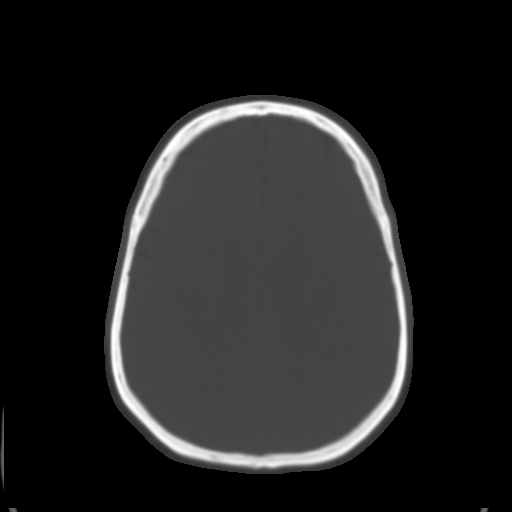
[im 25/34  brain]
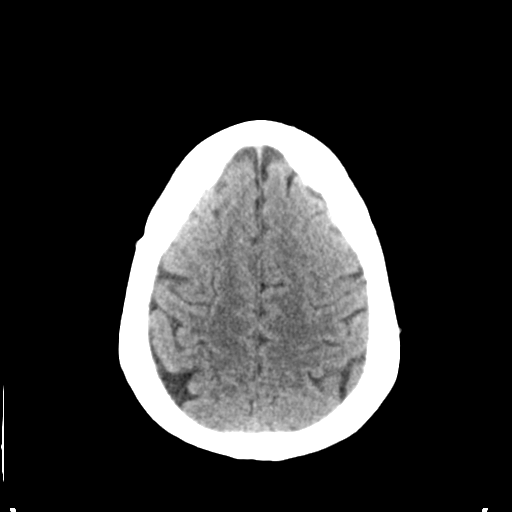
[im 29/34  brain]
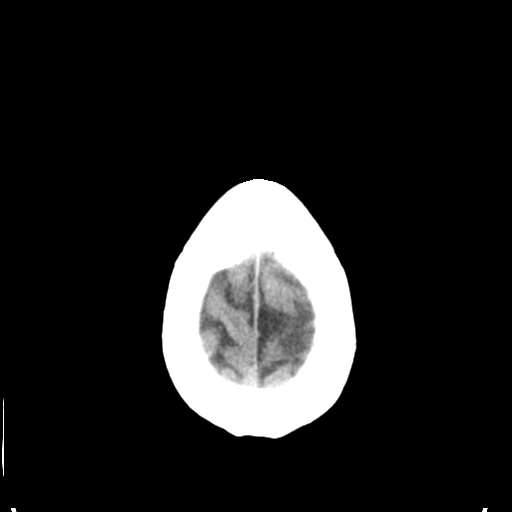

[Series 5: head without cor · coronal · non-contrast · 0.37mm/px · 3 of 69 slices shown]
[im 23/69  brain]
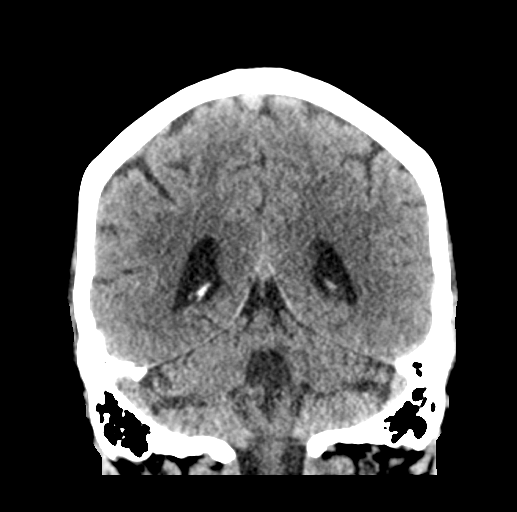
[im 31/69  brain]
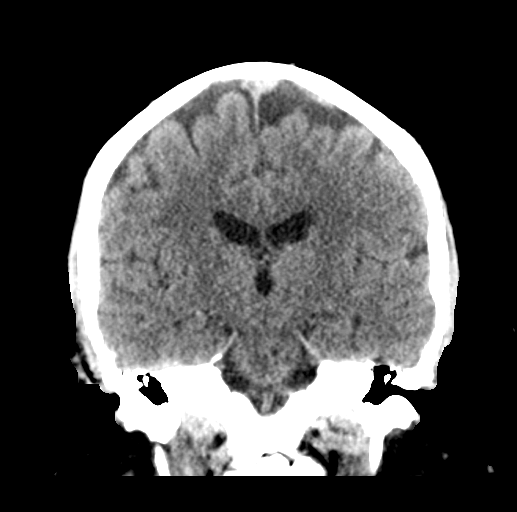
[im 38/69  brain]
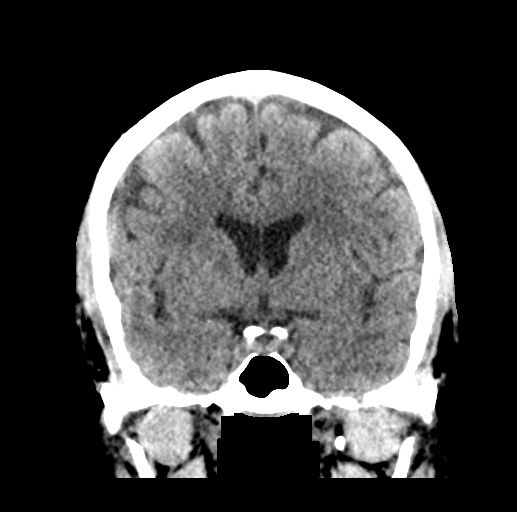

[Series 6: head without sag · sagittal · non-contrast · 0.32mm/px · 3 of 54 slices shown]
[im 18/54  brain]
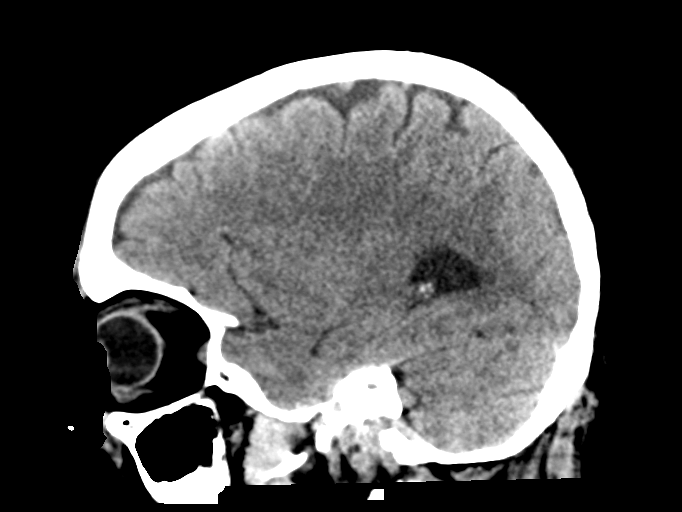
[im 27/54  brain]
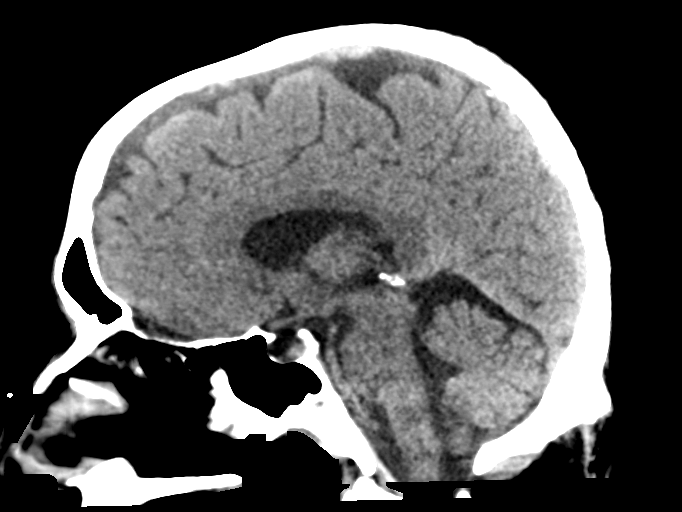
[im 36/54  brain]
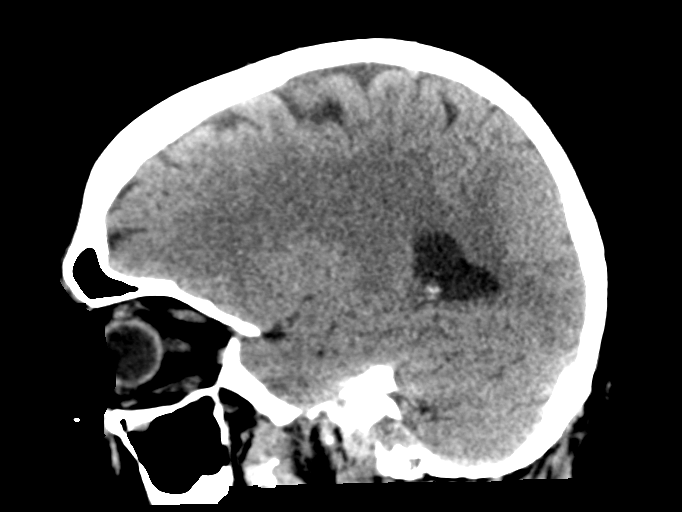

[16 of 47 positions shown; findings below may reference images not displayed]

FINDINGS: Brain: No hemorrhage or intracranial mass. Small focus of
hypodensity in the right white matter. Normal ventricle size.

Vascular: No hyperdense vessels.  No unexpected calcification

Skull: Normal. Negative for fracture or focal lesion.

Sinuses/Orbits: Mucosal thickening or retention cysts in the
maxillary sinuses and ethmoid sinuses

Other: None
IMPRESSION: 1. Small focus of hypodensity in the right white matter, may
represent age indeterminate lacunar infarct or age indeterminate
small vessel ischemic change.
2. Otherwise no CT evidence for acute intracranial abnormality.

## 2023-01-19 ENCOUNTER — Ambulatory Visit: Payer: Medicare Other | Admitting: Adult Health

## 2023-01-19 ENCOUNTER — Ambulatory Visit (INDEPENDENT_AMBULATORY_CARE_PROVIDER_SITE_OTHER): Payer: Medicare Other | Admitting: Adult Health

## 2023-01-19 ENCOUNTER — Encounter: Payer: Self-pay | Admitting: Adult Health

## 2023-01-19 VITALS — BP 128/85 | HR 79 | Ht 69.0 in | Wt 245.0 lb

## 2023-01-19 DIAGNOSIS — Z9989 Dependence on other enabling machines and devices: Secondary | ICD-10-CM

## 2023-01-19 DIAGNOSIS — G4733 Obstructive sleep apnea (adult) (pediatric): Secondary | ICD-10-CM | POA: Diagnosis not present

## 2023-01-19 DIAGNOSIS — I639 Cerebral infarction, unspecified: Secondary | ICD-10-CM | POA: Diagnosis not present

## 2023-01-19 DIAGNOSIS — F0630 Mood disorder due to known physiological condition, unspecified: Secondary | ICD-10-CM

## 2023-01-19 DIAGNOSIS — F482 Pseudobulbar affect: Secondary | ICD-10-CM | POA: Diagnosis not present

## 2023-01-19 DIAGNOSIS — I69398 Other sequelae of cerebral infarction: Secondary | ICD-10-CM

## 2023-01-19 MED ORDER — AMITRIPTYLINE HCL 25 MG PO TABS: 25.0000 mg | ORAL_TABLET | Freq: Every day | ORAL | 3 refills | Status: DC

## 2023-01-19 NOTE — Progress Notes (Signed)
[facility] [address] [place].  [address] [phone]       OFFICE FOLLOW UP NOTE  [name] Date of Birth:  09/30/1965 Medical Record Number:  [id number]   Reason for visit: stroke and CPAP f/u    SUBJECTIVE:   CHIEF COMPLAINT:  Chief Complaint  Patient presents with   Follow-up    Rm 3, here with wife Leonard Donovan Pt is following up on stroke and CPAP. Pt states he is doing better. Pt states he get really tired at the end of day and the weakness on left side continues.       HPI:   Update 01/19/2023 JM: Patient returns for follow-up visit accompanied by his wife.  Stable from stroke standpoint without new stroke/TIA symptoms.  Residual LUE weakness, cognitive impairment and gait impairment stable. Continues to get fatigued quicker after increased exertion (compared to pre stroke). Remains on amitriptyline 25 mg nightly for mood stabilization, takes around 6pm due to causing morning grogginess taking at bedtime. Does still have some anxiety at times but no longer having extreme emotional fluctuations.  Approved for Social Security disability in 06/2022.  On aspirin, Plavix and atorvastatin.  Routinely follows with PCP and cardiology.   CPAP compliance report over the past 30 days shows excellent usage at 100% compliance and 97% compliance greater than 4 hours.  Residual AHI 5.7.  Pressure in the 95th percentile 10.8 on set pressure of 5-13 with EPR level 3.  Leaks in the 98th percentile 8.6.  Overall tolerate CPAP well but isn't a huge fan of it.  For the most part he sleeps well and daytime energy levels satisfactory. DME [employer], up to date on supplies.      History provided for reference purposes only Update 04/20/2022 JM: Patient returns for 105-month follow-up accompanied by his wife. At prior visit, reported increased left hand weakness, worsening dysphagia and worsening cognition.  Completed MRI brain which did not show any acute  findings or new findings compared to prior imaging, did mention possible outpouching of basilar tip vs artifact, completed CTA head which did not show any evidence of aneurysms or abnormal findings. Reports gradual improvement since that time. Denies any further worsening or new stroke/TIA symptoms. No recent issues with swallowing. Reports improvement of anxiety and heightened emotions on amitriptyline 25 mg nightly. Does need to take in the evening as he experienced morning grogginess when taking at bedtime.   He has remained on both aspirin and Plavix as well as atorvastatin Blood pressure well controlled, slightly elevated today but believes this was because he ate pork yesterday Closely follows with PCP Dr. Mayford Donovan and cardiologist Dr. Jens Donovan  CPAP compliance report over the past 30 days shows 30 out of 30 usage days and 28 days greater than 4 hours for 93% compliance.  Average usage 6 hours and 52 minutes.  Residual AHI 3.5.  Pressure in the 95th percentile 10.6 on pressure settings of 5-13 with EPR level 3.  Leaks in the 95th percentile 2.0.  Reports relatively doing well on CPAP, does not necessarily like using understands importance of continued use.  Feels well rested in the morning and usually sleeps well throughout the night.  Update 12/07/2021 JM: Patient returns for 42-month stroke follow-up accompanied by his wife. Reports over the past 2-3 weeks, patient has had more difficulty holding objects with left hand, increased drooling and more episodes of choking (usually with eating/drinking but can also be on his saliva). Wife also  notes possible worsening of concentration and forgetfulness and worsening anxiety/irritability with panic attacks  as well as periods of inappropriate laughing or crying.  Slurred speech still present and can worsen with fatigue which is his baseline. Continued dizziness and imbalance at baseline. Occasional falls still but has been less compared to prior visit, still  without any significant injury.  Patient does note experiencing a severe right occipital headache at night about 2 weeks ago, described as a sharp stabbing pain that lasted about 2 minutes then resolved. He informed his wife the next morning. He had a mild headache a couple days later in the same area but nothing since that time.   Reports compliance on aspirin and Plavix without side effects. Of note, did hold plavix for scheduled colonoscopy 5 days prior to procedure on 6/16 but unable to complete colonoscopy as he was not "cleared out enough". He did remain on aspirin during that duration.  Compliant on atorvastatin, denies side effects.  Blood pressure today 135/96.  Did have some BP med adjustment back in May and has been stable since that time, denies any low BP readings at home. Reports continued nightly CPAP compliance. Did not discuss fully today due to time constraint.  No further concerns at this time.  Update 06/09/2020 [id number] returns for initial CPAP compliance visit and stroke follow up. Previously seen 4 months ago for stroke f/u. Completed HST 02/24/2021 which showed severe OSA with strong REM sleep component with total AHI 53.2/h and recommended initiating AutoPap which was started on 03/25/2021.  Compliance report from 05/09/2021 -06/07/2021 shows 24 out of 30 usage days for 80% compliance with 20 days greater than 4 hours per 67% compliance.  Average usage 5 hours and 18 minutes.  Residual AHI 10.7.  Leaks in the 95th percentile 5.8 (max 41.9).  Pressure in the 95th percentile 12.6 with mean pressure 6 and max pressure 18 and EPR level 3. Has difficulty tolerating pressure at times as he feels he cannot breathe and also c/o leaks around his mask. Does notice some improvement of daytime fatigue and sleeping better at night.  Otherwise tolerating mask well.  Current use of full facemask.  Epworth Sleepiness Scale 9.  Stable from stroke standpoint - residual mild LUE weakness, cognitive  impairment and gait impairment with imbalance stable. Does note some improvement of LUE strength especially left hand since s/p left rotator cuff repair and procedure for adhesive capsulitis and subacromial decompression of left shoulder on 04/12/2021 by Dr. Lucianne Lei. Working with PT with gradual improvement. Gradually trying to drive - still having occultly with inattention. Recurrent falls thankfully without severe injury or hitting head - usually on uneven ground (walking in the woods). Tried Nuedexta for suspected PBA - felt this caused worsening depression. Still occasionally laughing at inappropriate times but more so seems to be heightened emotions. Remains on disability - in the process of applying for Social Security disability.  Denies new stroke/TIA symptoms.  Compliant on aspirin, Plavix, atorvastatin and Zetia without side effects.  Blood pressure today 155/95.  Routinely follows with PCP and cardiology.  No new concerns at this time.   Update 02/16/2021 JM: Mr. Cazes returns for 63-month stroke follow-up accompanied by his wife, Leonard Donovan.  Overall doing well.  Completed SLP 8/16 as he met all goals and only rare word finding difficulty and occasional deviations in attention and reduced awareness.  Left-sided weakness with some improvement since prior visit.  Followed by orthopedics for adhesive capsulitis and currently working with PT  noting some improvement. Continued occassional imbalance and occasional swallowing difficulties but seems to be more related to inattention.  He has not previously worked with PT for imbalance as he was previously working with PT for cardiac rehab. Per wife, continued uncontrolled crying and occasional uncontrolled laughing -previously discussed Nuedexta for PBA -declined interest at that time but now interested in trialing.  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  Denies new stroke/TIA symptoms.  Remains on aspirin and Plavix as well as atorvastatin and  zetia without side effects.  Blood pressure today 155/98. Routinely monitors at home - cardiology recently increased BP meds last week.  Evaluated by Dr. Vickey Huger for concern of possible sleep apnea and plans on completing HST tonight.  No further concerns at this time  Update 11/24/2020 JM: Mr. Reynaga returns for 82-month stroke follow-up accompanied by his wife.  Patient reports residual left-sided weakness/pain, occasional imbalance and fatigue but overall improving.  Currently working with OT for residual left-sided weakness and shoulder pain -currently being followed by orthopedics with MRI completed yesterday and has scheduled visit next week for further evaluation due to continued shoulder pain interfering with therapy sessions as well as sleep.  Also continues to work with SLP for aphasia, dysarthria and cognitive communication deficit.  Completed MBS 4/5 which was normal although he does have occasional difficulty swallowing water.  Denies new stroke/TIA symptoms. Remains on aspirin and Plavix as well as atorvastatin and zetia without associated side effects.  Blood pressure today 125/83. Completed 30-day cardiac event monitor which was negative for atrial fibrillation. He does report daytime fatigue as well as insomnia -has not previously underwent sleep study.  Wife is concerned regarding labile emotions where he may laugh or cry uncontrollably during situations that may not be that funny or sad.  No further concerns at this time.  Initial visit 08/24/2020 JM: Mr. Lippold is being seen for hospital follow-up accompanied by his wife  Reports residual left-sided weakness, left facial droop, swallowing difficulties and cognitive difficulties Evaluated by [place] SLP 3/7 - noted mild cognitive linguistic deficits; no concerns of aspiration -personally reviewed evaluation note -no further SLP recommended as patient and wife reported that he was at baseline At todays visit, wife reports occasional  confusion and delayed processing which patient agrees with Report of swallowing difficulties only with water otherwise denies difficulty Denies new stroke/TIA symptoms 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 Has cards f/u on Wednesday Currently wearing cardiac monitor which will be completed on 4/3  No further concerns at this time  Stroke admission 07/20/2020 Mr. DAVIONNE STUTZMAN is a 57 y.o. male with history of hypertension, coronary artery disease (s/p quadruple CABG on 07/08/2020), glaucoma, COVID-19 infection (sx on 06/07/2020, dx on 06/18/2020),  who presented on 07/20/2020 with left sided facial droop, slurred speech, and left upper extremity weakness.   Personally reviewed hospitalization pertinent progress notes, lab work and imaging with summary provided.  Evaluated by Dr. Roda Shutters with stroke work-up revealing multifocal infarcts, largest at right pontine, likely related to recent cardiac surgery however other cardioembolic source cannot be ruled out.  Recommended 30-day cardiac event  monitor outpatient to rule out A. fib.  On DAPT PTA and recommended continuation at discharge per cardiology recommendations s/p CABG 07/08/2020.  LDL 49 on atorvastatin 80 mg daily.  Other stroke risk factors include former tobacco use, obesity and suspected OSA.  Residual deficit of mild left facial droop, left hemiparesis and moderate cognitive deficits.  Stroke:  Multifocal infarcts, largest at right pontine, likely related to recent cardiac surgery. However, other cardioembolic source can not rule  out Code Stroke CT head: Small focus of hypodensity in the right white matter. CTA head & neck: no intracranial arterial occlusion or high-grade stenosis. Bilateral carotid bifurcation atherosclerosis, right more than left MRI Acute right pontine infarct in addition to multiple small acute to early subacute infarcts within the bilateral frontal and parietal white matter, consistent with a central (cardiac or aortic) embolic source. No hemorrhage or mass effect. Recommend 30 day monitor to evaluate for atrial fibrillation as the source for stroke 2D Echo: EF 60 to 65% LDL 49 HgbA1c 5.6 VTE prophylaxis - Lovenox 40mg  daily Swallow eval: passed for heart healthy diet aspirin 81 mg daily and Plavix prior to admission, continue aspirin 81 mg daily and clopidogrel 75 mg daily DAPT on discharge. Therapy recommendations:  outpt SLP Disposition: home         ROS:   14 system review of systems performed and negative with exception of those listed in HPI  PMH:  Past Medical History:  Diagnosis Date   CAD (coronary artery disease) of bypass graft    CABG x 4   Glaucoma    H/O: CVA (cerebrovascular accident) 2022   Hyperlipidemia LDL goal <70    Hypertension     PSH:  Past Surgical History:  Procedure Laterality Date   CORONARY ARTERY BYPASS GRAFT N/A 07/08/2020   Procedure: CORONARY ARTERY BYPASS GRAFTING (CABG), ON PUMP, TIMES FOUR, LEFT INTERNAL MAMMARY ARTERY TO LAD, RIGHT SVG TO PDA, DISTAL CIRCUMFLEX, AND OM1;  Surgeon: Delight Ovens, MD;  Location: [facility] OR;  Service: Open Heart Surgery;  Laterality: N/A;   ENDOVEIN HARVEST OF GREATER SAPHENOUS VEIN Right 07/08/2020   Procedure: ENDOVEIN HARVEST OF RIGHT GREATER SAPHENOUS VEIN;  Surgeon: Delight Ovens, MD;  Location: [facility] OR;  Service: Open Heart Surgery;  Laterality: Right;   LEFT HEART CATH AND CORONARY ANGIOGRAPHY N/A 07/02/2020   Procedure: LEFT HEART CATH AND CORONARY ANGIOGRAPHY;  Surgeon: Corky Crafts, MD;   Location: [facility] INVASIVE CV LAB;  Service: Cardiovascular;  Laterality: N/A;   NO PAST SURGERIES     SHOULDER ARTHROSCOPY  04/12/2021   TEE WITHOUT CARDIOVERSION N/A 07/08/2020   Procedure: TRANSESOPHAGEAL ECHOCARDIOGRAM (TEE);  Surgeon: Delight Ovens, MD;  Location: [facility] OR;  Service: Open Heart Surgery;  Laterality: N/A;    Social History:  Social History   Socioeconomic History   Marital status: Married    Spouse name: Leonard Donovan   Number of children: Not on file   Years of education: Not on file   Highest education level: Not on file  Occupational History   Not on file  Tobacco Use   Smoking status: Former   Smokeless tobacco: Former  Building services engineer status: Never Used  Substance and Sexual Activity   Alcohol use: Not Currently   Drug use: Not Currently   Sexual activity: Not on file  Other Topics Concern   Not on file  Social History Narrative   Lives with wife   Social Determinants of Health   Financial  Resource Strain: Not on file  Food Insecurity: No Food Insecurity (03/16/2022)   [facility] prior to 07/30/2022[facility], [facility] visits prior to 07/30/2022., [facility]   Hunger Vital Sign    Worried About Running Out of Food in the Last Year: Never true    Ran Out of Food in the Last Year: Never true  Transportation Needs: Unknown (03/16/2022)   [facility] prior to 07/30/2022., [facility], [facility] visits prior to 07/30/2022., [facility]   PRAPARE - Transportation    Lack of Transportation (Medical): No    Lack of Transportation (Non-Medical): Patient declined  Physical Activity: Not on file  Stress: Not on file  Social Connections: Unknown (09/27/2021)   Received from [facility]   Social Network    Social Network: Not on file  Intimate Partner Violence: Unknown (09/01/2021)   Received from [facility]   HITS     Physically Hurt: Not on file    Insult or Talk Down To: Not on file    Threaten Physical Harm: Not on file    Scream or Curse: Not on file    Family History:  Family History  Problem Relation Age of Onset   Hypertension Father    CAD Sister        diagnosed in her 68s   Heart attack Sister     Medications:   Current Outpatient Medications on File Prior to Visit  Medication Sig Dispense Refill   acetaminophen (TYLENOL) 500 MG tablet Take 2 tablets (1,000 mg total) by mouth every 8 (eight) hours as needed for mild pain (or discomfort). 30 tablet 0   amitriptyline (ELAVIL) 25 MG tablet TAKE 1 TABLET BY MOUTH EVERYDAY AT BEDTIME 90 tablet 2   amLODipine (NORVASC) 10 MG tablet TAKE 1 TABLET BY MOUTH EVERY DAY 90 tablet 7   aspirin EC 81 MG EC tablet Take 1 tablet (81 mg total) by mouth daily. Swallow whole. 30 tablet 11   atorvastatin (LIPITOR) 80 MG tablet TAKE 1 TABLET BY MOUTH EVERY DAY 90 tablet 3   carvedilol (COREG) 6.25 MG tablet Take 1 tablet (6.25 mg total) by mouth 2 (two) times daily. 180 tablet 3   chlorthalidone (HYGROTON) 25 MG tablet Take 0.5 tablets (12.5 mg total) by mouth daily. 45 tablet 3   clopidogrel (PLAVIX) 75 MG tablet TAKE 1 TABLET BY MOUTH EVERY DAY 90 tablet 3   ezetimibe (ZETIA) 10 MG tablet TAKE 1 TABLET BY MOUTH EVERY DAY 90 tablet 3   Multiple Vitamins-Minerals (MULTIVITAMIN WITH MINERALS) tablet Take 1 tablet by mouth daily.     valsartan (DIOVAN) 320 MG tablet TAKE 1 TABLET BY MOUTH EVERY DAY 90 tablet 1   nitroGLYCERIN (NITROSTAT) 0.4 MG SL tablet Place 1 tablet (0.4 mg total) under the tongue every 5 (five) minutes as needed. 25 tablet 3   No current facility-administered medications on file prior to visit.    Allergies:  No Known Allergies    OBJECTIVE:  Physical Exam  Vitals:   01/19/23 0736  BP: 128/85  Pulse: 79  Weight: 245 lb (111.1 kg)  Height: 5\' 9"  (1.753 m)   Body mass index is 36.18 kg/m. No results found.  General: well  developed, well nourished, very pleasant middle-age Caucasian male, seated, in no evident distress Head: head normocephalic and atraumatic.   Neck: supple with no carotid or supraclavicular bruits  Cardiovascular: regular rate and rhythm, no murmurs Musculoskeletal: no deformity Skin:  no rash/petichiae Vascular:  Normal pulses all extremities   Neurologic Exam Mental Status: Awake and fully alert. Occasional word finding difficulty.  Follows commands without difficulty.  No evidence of dysarthria.  Oriented to place and time. Recent and remote memory intact. Attention span, concentration and fund of knowledge fluctuated during visit. Mood and affect appropriate.   Cranial Nerves: Pupils equal, briskly reactive to light. Extraocular movements full without nystagmus. Visual fields full to confrontation. Hearing intact. Facial sensation intact.  Mild left lower facial weakness.  Tongue and palate moves normally and symmetrically.  Motor: Normal bulk and tone and strength right upper and lower extremity LUE: 4+/5 grip strength otherwise 5/5 LLE: 5/5 Sensory.: intact to touch , pinprick , position and vibratory sensation.  Coordination: Rapid alternating movements normal in all extremities except very slight decreased left hand. Finger-to-nose slightly impaired LUE and heel-to-shin performed accurately bilaterally.  Orbits right arm over left arm. Gait and Station: Arises from chair without difficulty. Stance is normal. Gait demonstrates normal stride length without use of assistive device. Tandem walk and heel toe without difficulty Reflexes: 1+ and symmetric. Toes downgoing.        ASSESSMENT: Leonard Donovan is a 57 y.o. year old male with multifocal infarcts on 07/20/2020, largest in the right pontine, likely related to cardiac surgery (s/p quadruple CABG 2/9).  Vascular risk factors include HTN, HLD, CAD s/p CABG 06/2020, COVID-19 infection 05/2020, former tobacco use and severe OSA now on CPAP.  S/p left shoulder rotator cuff repair, manipulation for adhesive capsulitis and subacromial decompression 04/12/2021 by Dr. Samuel Bouche. C/o worsening stroke deficits 11/2021 with MRI brain no acute infarct and has been gradually improving since that time.      PLAN:  Multifocal infarcts:  Residual deficit: mild left hand weakness, cognitive impairment (high level executive functioning and inattention), left facial weakness and gait impairment with imbalance.  Stable since prior visit.  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  Advised to call if this should worsen to consider dosage increase. Takes at 6pm, caused morning grogginess when taking at bedtime. Refill provided.  Intolerant to Nuedexta Now on Social Security disability Cardiac event monitor negative for atrial fibrillation Continue aspirin 81 mg daily, Plavix and atorvastatin for secondary stroke prevention and per cardiology recommendations, medications refilled/managed by PCP/cardiology Discussed secondary stroke prevention measures and importance of close PCP follow up for aggressive stroke risk factor management including BP goal<130/90, and HLD with LDL goal<70 LDL 69 (08/2022)  Severe OSA:  Compliance report shows satisfactory usage, residual AHI 5.7.  Recommend continued pressure settings at this time.  Discussed continued nightly usage with ensuring greater than 4 hours nightly for optimal benefit and per insurance purposes.  Continue to follow with DME company for any needed supplies or CPAP related concerns HST 01/2021 severe OSA with total AHI 53.2/h accentuated during REM sleep with AHI 78.7/h CPAP set up date 03/25/2021      Follow up in 1 year or call earlier if needed   CC:  PCP: Montez Hageman, DO    I spent 25 minutes of face-to-face and non-face-to-face time with patient and wife.  This included previsit chart  review, lab review, study review, order entry, electronic health record documentation, and patient and wife education and discussion regarding above diagnoses and treatment plan and answered all the questions  to patient wife satisfaction  Ihor Austin, [facility]  [facility] [address] [place], [place] [address]  Phone [phone] Fax [phone] Note: This document was prepared with digital dictation and possible smart phrase technology. Any transcriptional errors that result from this process are unintentional.

## 2023-01-19 NOTE — Patient Instructions (Addendum)
Continue nightly use of CPAP with ensuring greater than 4 hours per night  Continue to follow with your DME company [employer] for any needed supplies or CPAP related concerns  Continue Elavil 25mg  nightly - please let me know if anxiety worsens or you would like to increase dosage   Continue aspirin 81 mg daily and clopidogrel 75 mg daily  and atorvastatin for secondary stroke prevention  Continue to follow up with PCP regarding blood pressure and cholesterol management  Maintain strict control of hypertension with blood pressure goal below 130/90 and cholesterol with LDL cholesterol (bad cholesterol) goal below 70 mg/dL.   Signs of a Stroke? Follow the BEFAST method:  Balance Watch for a sudden loss of balance, trouble with coordination or vertigo Eyes Is there a sudden loss of vision in one or both eyes? Or double vision?  Face: Ask the person to smile. Does one side of the face droop or is it numb?  Arms: Ask the person to raise both arms. Does one arm drift downward? Is there weakness or numbness of a leg? Speech: Ask the person to repeat a simple phrase. Does the speech sound slurred/strange? Is the person confused ? Time: If you observe any of these signs, call 911.      Followup in the future with me in 1 year or call earlier if needed       Thank you for coming to see [place] at [facility]. I hope we have been able to provide you high quality care today.  You may receive a patient satisfaction survey over the next few weeks. We would appreciate your feedback and comments so that we may continue to improve ourselves and the health of our patients.

## 2023-04-22 ENCOUNTER — Other Ambulatory Visit: Payer: Self-pay | Admitting: Cardiology

## 2023-05-19 ENCOUNTER — Other Ambulatory Visit: Payer: Self-pay | Admitting: Cardiology

## 2023-05-19 DIAGNOSIS — I10 Essential (primary) hypertension: Secondary | ICD-10-CM

## 2023-06-13 ENCOUNTER — Other Ambulatory Visit: Payer: Self-pay | Admitting: Cardiology

## 2023-06-13 DIAGNOSIS — I10 Essential (primary) hypertension: Secondary | ICD-10-CM

## 2023-07-06 ENCOUNTER — Other Ambulatory Visit: Payer: Self-pay | Admitting: Adult Health

## 2023-07-06 DIAGNOSIS — F482 Pseudobulbar affect: Secondary | ICD-10-CM

## 2023-07-11 ENCOUNTER — Other Ambulatory Visit: Payer: Self-pay | Admitting: Cardiology

## 2023-07-11 DIAGNOSIS — I10 Essential (primary) hypertension: Secondary | ICD-10-CM

## 2023-07-12 MED ORDER — AMITRIPTYLINE HCL 25 MG PO TABS: 25.0000 mg | ORAL_TABLET | Freq: Every day | ORAL | 3 refills | Status: DC

## 2023-07-12 NOTE — Telephone Encounter (Signed)
Wife called back and I was able to review information that we received stating the pharmacy was out of stock. I will send to [facility] pharmacy as requested by wife.

## 2023-07-12 NOTE — Telephone Encounter (Signed)
Left message for patient to call to see if he would like a different pharmacy.

## 2023-07-26 ENCOUNTER — Other Ambulatory Visit: Payer: Self-pay | Admitting: Cardiology

## 2023-07-27 ENCOUNTER — Other Ambulatory Visit: Payer: Self-pay | Admitting: Cardiology

## 2023-08-02 ENCOUNTER — Other Ambulatory Visit: Payer: Self-pay | Admitting: Cardiology

## 2023-08-02 DIAGNOSIS — I10 Essential (primary) hypertension: Secondary | ICD-10-CM

## 2023-08-03 ENCOUNTER — Other Ambulatory Visit: Payer: Self-pay | Admitting: Cardiology

## 2023-08-18 ENCOUNTER — Other Ambulatory Visit: Payer: Self-pay | Admitting: Cardiology

## 2023-08-22 ENCOUNTER — Other Ambulatory Visit: Payer: Self-pay | Admitting: Cardiology

## 2023-08-24 NOTE — Progress Notes (Deleted)
 Cardiology Office Note    Date:  08/24/2023  ID:  Leonard, Donovan Aug 19, 1965, MRN [id number] PCP:  Leonard Hageman, DO  Cardiologist:  Leonard Millers, MD  Electrophysiologist:  None   Chief Complaint: ***  History of Present Illness: .    Leonard Donovan is a 58 y.o. male with visit-pertinent history of hypertension, glaucoma, prediabetes, CAD s/p CABG in 06/2020 and CVA.  In 06/2020 patient presented to the hospital with diffuse chest pain for 2 weeks and ruled in for NSTEMI.  Cardiac catheterization performed on 2/30/2022 revealed 75% proximal LAD, 80% mid left circumflex lesion, 75% OM1 lesion, 80% proximal RCA lesion, 75% PDA lesion.  Echocardiogram showed EF 60 to 65%, mild MR.  Patient was referred to cardiothoracic surgery service and underwent successful CABG x 4 with LIMA to LAD, SVG to PDA, distal LCx and OM1 by Dr. Nydia Donovan on 07/08/2020.  Preoperative carotid duplex showed no significant left carotid artery disease, 40 to 59% right carotid artery disease.  Creatinine was initially elevated but returned to normal prior to discharge.  Unfortunately after his discharge from the hospital he returned back on 07/08/2020 due to slurred speech, left upper extremity weakness and was diagnosed with stroke based on MRI findings.  MRI demonstrated multiple small acute or subacute early infarct with the bilateral frontal and parietal white matter consistent with central embolus stroke.  Neurology was consulted and recommended to continue on aspirin and Plavix along with Lipitor.  Neurology service also recommended a 30-day event monitor.  Repeat echo showed EF 66 5%, no intracardiac source of embolism detected.  Outpatient cardiac monitor in April 2022 showed sinus rhythm with no atrial fibrillation.  Following discharge she was seen in the office with 3 episodes of syncope that was felt to likely be orthostatic mediated.  Patient had 1 episode in office and systolic blood pressure was in the 80s.   Medications were discontinued.  His antihypertensive regimen was slowly increased.  Patient was last seen in office by Dr. Jens Donovan on 05/05/2022.  Patient had remained stable from a cardiac standpoint.  CAD: s/p CABG x 4 with LIMA to LAD, SVG to PDA, distal LCx and OM1 by Dr. Nydia Donovan on 07/08/2020.  History of CVA: Patient was CVA after bypass surgery.  30-day cardiac monitor showed no evidence of atrial fibrillation.  Patient has been continued on aspirin and Plavix per neurology.  Hypertension: Blood pressure today  Hyperlipidemia: Last lipid profile on  Carotid artery disease: Carotid Dopplers on 10/14/2021 indicated bilateral 1 to 39% stenosis.  OSA: Patient reports CPAP compliance.  This has been managed by neurology.  Labwork independently reviewed:   ROS: .   *** denies chest pain, shortness of breath, lower extremity edema, fatigue, palpitations, melena, hematuria, hemoptysis, diaphoresis, weakness, presyncope, syncope, orthopnea, and PND.  All other systems are reviewed and otherwise negative.  Studies Reviewed: Marland Kitchen    EKG:  EKG is ordered today, personally reviewed, demonstrating ***     CV Studies: Cardiac studies reviewed are outlined and summarized above. Otherwise please see EMR for full report. Cardiac Studies & Procedures   ______________________________________________________________________________________________ CARDIAC CATHETERIZATION  CARDIAC CATHETERIZATION 07/02/2020  Narrative  1st Mrg lesion is 75% stenosed.  Mid Cx lesion is 80% stenosed.  Prox RCA lesion is 80% stenosed.  Mid LAD lesion is 50% stenosed.  Dist LAD lesion is 75% stenosed.  RPDA-1 lesion is 75% stenosed.  RPDA-2 lesion is 50% stenosed.  RPAV lesion is 50% stenosed.  Prox  LAD to Mid LAD lesion is 75% stenosed.  The left ventricular systolic function is normal.  LV end diastolic pressure is normal.  The left ventricular ejection fraction is 55-65% by visual estimate.   There is no aortic valve stenosis.  Diffuse three vessel disease, particularly throughout the LAD.  Plan for cardiac surgery consult.  Distal small vessel disease may affect quality of targets.  I stressed the importance of risk factor modification with the patient including lipid lowering therapy and BP control.  Results discussed with his wife, Leonard Donovan.  Findings Coronary Findings Diagnostic  Dominance: Right  Left Anterior Descending There is mild diffuse disease throughout the vessel. Prox LAD to Mid LAD lesion is 75% stenosed. Mid LAD lesion is 50% stenosed. Dist LAD lesion is 75% stenosed.  Left Circumflex Mid Cx lesion is 80% stenosed.  First Obtuse Marginal Branch 1st Mrg lesion is 75% stenosed.  Right Coronary Artery There is mild diffuse disease throughout the vessel. Prox RCA lesion is 80% stenosed.  Right Posterior Descending Artery RPDA-1 lesion is 75% stenosed. RPDA-2 lesion is 50% stenosed.  Right Posterior Atrioventricular Artery RPAV lesion is 50% stenosed.  Intervention  No interventions have been documented.     ECHOCARDIOGRAM  ECHOCARDIOGRAM COMPLETE 07/21/2020  Narrative ECHOCARDIOGRAM REPORT    Patient Name:   Leonard Donovan Date of Exam: 07/21/2020 Medical Rec #:  [id number]      Height:       69.0 in Accession #:    [id number]     Weight:       214.3 lb Date of Birth:  Aug 04, 1965     BSA:          2.127 m Patient Age:    54 years       BP:           128/86 mmHg Patient Gender: M              HR:           56 bpm. Exam Location:  Inpatient  Procedure: 2D Echo, Cardiac Doppler and Color Doppler  Indications:    Stroke 434.91 / I163.9  History:        Patient has prior history of Echocardiogram examinations, most recent 07/08/2020. Risk Factors:Hypertension.  Sonographer:    Elmarie Shiley Dance Referring Phys: [id number] Leonard Donovan  IMPRESSIONS   1. Left ventricular ejection fraction, by estimation, is 60 to 65%. The left ventricle  has normal function. The left ventricle has no regional wall motion abnormalities. There is mild concentric left ventricular hypertrophy. Left ventricular diastolic parameters are indeterminate. 2. Right ventricular systolic function is low normal. The right ventricular size is normal. 3. Left atrial size was mildly dilated. 4. The mitral valve is normal in structure. Trivial mitral valve regurgitation. 5. The aortic valve is tricuspid. Aortic valve regurgitation is not visualized. No aortic stenosis is present. 6. The inferior vena cava is normal in size with greater than 50% respiratory variability, suggesting right atrial pressure of 3 mmHg.  Comparison(s): No significant change from prior study.  Conclusion(s)/Recommendation(s): No intracardiac source of embolism detected on this transthoracic study. A transesophageal echocardiogram is recommended to exclude cardiac source of embolism if clinically indicated.  FINDINGS Left Ventricle: Left ventricular ejection fraction, by estimation, is 60 to 65%. The left ventricle has normal function. The left ventricle has no regional wall motion abnormalities. The left ventricular internal cavity size was normal in size. There is mild concentric left ventricular hypertrophy. Abnormal (paradoxical)  septal motion consistent with post-operative status. Left ventricular diastolic parameters are indeterminate.  Right Ventricle: The right ventricular size is normal. Right vetricular wall thickness was not well visualized. Right ventricular systolic function is low normal.  Left Atrium: Left atrial size was mildly dilated.  Right Atrium: Right atrial size was normal in size.  Pericardium: Trivial pericardial effusion is present.  Mitral Valve: The mitral valve is normal in structure. Trivial mitral valve regurgitation.  Tricuspid Valve: The tricuspid valve is normal in structure. Tricuspid valve regurgitation is trivial. No evidence of tricuspid  stenosis.  Aortic Valve: The aortic valve is tricuspid. Aortic valve regurgitation is not visualized. No aortic stenosis is present.  Pulmonic Valve: The pulmonic valve was not well visualized. Pulmonic valve regurgitation is trivial.  Aorta: The aortic root, ascending aorta and aortic arch are all structurally normal, with no evidence of dilitation or obstruction.  Venous: The inferior vena cava is normal in size with greater than 50% respiratory variability, suggesting right atrial pressure of 3 mmHg.  IAS/Shunts: The atrial septum is grossly normal.   LEFT VENTRICLE PLAX 2D LVIDd:         3.90 cm  Diastology LVIDs:         3.30 cm  LV e' medial:    7.29 cm/s LV PW:         1.70 cm  LV E/e' medial:  13.5 LV IVS:        1.40 cm  LV e' lateral:   10.40 cm/s LVOT diam:     2.10 cm  LV E/e' lateral: 9.5 LV SV:         71 LV SV Index:   33 LVOT Area:     3.46 cm   RIGHT VENTRICLE             IVC RV Basal diam:  2.80 cm     IVC diam: 1.80 cm RV S prime:     11.20 cm/s TAPSE (M-mode): 1.6 cm  LEFT ATRIUM             Index       RIGHT ATRIUM           Index LA diam:        4.60 cm 2.16 cm/m  RA Area:     11.70 cm LA Vol (A2C):   56.7 ml 26.65 ml/m RA Volume:   20.60 ml  9.68 ml/m LA Vol (A4C):   59.8 ml 28.11 ml/m LA Biplane Vol: 58.5 ml 27.50 ml/m AORTIC VALVE LVOT Vmax:   103.00 cm/s LVOT Vmean:  68.800 cm/s LVOT VTI:    0.205 m  AORTA Ao Root diam: 3.40 cm Ao Asc diam:  3.30 cm  MITRAL VALVE MV Area (PHT): 3.27 cm    SHUNTS MV Decel Time: 232 msec    Systemic VTI:  0.20 m MV E velocity: 98.50 cm/s  Systemic Diam: 2.10 cm MV A velocity: 74.20 cm/s MV E/A ratio:  1.33  Jodelle Red MD Electronically signed by Jodelle Red MD Signature Date/Time: 07/21/2020/5:11:14 PM    Final   TEE  ECHO INTRAOPERATIVE TEE 07/08/2020  Narrative *INTRAOPERATIVE TRANSESOPHAGEAL REPORT *    Patient Name:   Leonard Donovan Date of Exam:  07/08/2020 Medical Rec #:  [id number]      Height:       69.0 in Accession #:    [id number]     Weight:       209.8 lb Date of Birth:  27-Nov-1965  BSA:          2.11 m Patient Age:    54 years       BP:           150/83 mmHg Patient Gender: M              HR:           51 bpm. Exam Location:  Anesthesiology  Transesophogeal exam was perform intraoperatively during surgical procedure. Patient was closely monitored under general anesthesia during the entirety of examination.  Indications:     CAD Native Vessel i25.10 Sonographer:     Irving Burton Senior RDCS Performing Phys: [id number] Gwenith Daily [id number] Diagnosing Phys: Kipp Brood MD  Complications: No known complications during this procedure. PRE-OP FINDINGS Left Ventricle: Marland Kitchen There was normal LV systolic function with the ejection fraction estimated at 55-60% using the 3DQ volumetric package. There were no regional wall motion abnormalities. There was moderate concentric left ventricular hypertrophy with the LV wall thickness 1.15-1.20 cm.  On the post-bypass exam, the LV systolic function was normal and unchanged from the pre-bypass exam.  Right Ventricle: The right ventricle has normal systolic function. The cavity was normal. There is no increase in right ventricular wall thickness. On the post-bypass exam, the RV systolic function was normal and unchanged from the pre-bypass study.  Left Atrium: Left atrial size was dilated. The left atrial appendage is well visualized and there is evidence of thrombus present. Left atrial appendage velocity is normal at greater than 40 cm/s.  Right Atrium: Right atrial size was dilated.  Interatrial Septum: No atrial level shunt detected by color flow Doppler.  Pericardium: There is no evidence of pericardial effusion.  Mitral Valve: The mitral valve is normal in structure. Mild thickening of the mitral valve leaflet. Mitral valve regurgitation is trivial by color flow Doppler. There is No evidence of  mitral stenosis.  Tricuspid Valve: The tricuspid valve was normal in structure. Tricuspid valve regurgitation is trivial by color flow Doppler.  Aortic Valve: The aortic valve is tricuspid Aortic valve regurgitation was not visualized by color flow Doppler. There is no evidence of aortic valve stenosis. There is no evidence of a vegetation on the aortic valve.  Pulmonic Valve: The pulmonic valve was normal in structure, with normal. No evidence of pumonic stenosis. Pulmonic valve regurgitation is trivial by color flow Doppler.   Aorta: The ascending aorta was normal in diameter with a well defined aortic root and sinotubular junction without dilataion or effacement. The aortic root at the sinuses of valsalva was 2.98 cm The STJ was 2.91 cm. The proximal ascending aorta was 2.93 cm. There was normal wall thickness of the ascending aorta.  The descending aorta was normal in diameter and measured . There was grade 3-4 atherosclerotic plaque within the descending aorta was 2.03 cm in diameter.  +--------------+-------++ LEFT VENTRICLE        +--------------+-------++ PLAX 2D               +------------+---------++ +--------------+-------++ 3D Volume EF          LVIDd:        4.80 cm +------------+---------++ +--------------+-------++ LV 3D EDV:  126.87 ml LVIDs:        3.70 cm +------------+---------++ +--------------+-------++ LV 3D ESV:  50.92 ml  LV PW:        1.60 cm +------------+---------++ +--------------+-------++ LV IVS:       0.80 cm +--------------+-------++ LV SV:  49 ml   +--------------+-------++ LV SV Index:  22.66   +--------------+-------++                       +--------------+-------++  +--------------+----------++ MITRAL VALVE             +--------------+----------++ MV Area (PHT):2.96 cm   +--------------+----------++ MV PHT:       74.24 msec +--------------+----------++ MV Decel Time:256  msec   +--------------+----------++ +--------------+----------++ MV E velocity:78.90 cm/s +--------------+----------++ MV A velocity:46.70 cm/s +--------------+----------++ MV E/A ratio: 1.69       +--------------+----------++   Kipp Brood MD Electronically signed by Kipp Brood MD Signature Date/Time: 07/08/2020/6:45:51 PM    Final  MONITORS  CARDIAC EVENT MONITOR 09/02/2020  Narrative Sinus bradycardia to normal sinus rhythm.  No documented atrial fibrillation. Leonard Donovan       ______________________________________________________________________________________________       Current Reported Medications:.    No outpatient medications have been marked as taking for the 08/25/23 encounter (Appointment) with Rip Harbour, NP.    Physical Exam:    VS:  There were no vitals taken for this visit.   Wt Readings from Last 3 Encounters:  01/19/23 245 lb (111.1 kg)  05/05/22 242 lb (109.8 kg)  04/20/22 243 lb (110.2 kg)    GEN: Well nourished, well developed in no acute distress NECK: No JVD; No carotid bruits CARDIAC: ***RRR, no murmurs, rubs, gallops RESPIRATORY:  Clear to auscultation without rales, wheezing or rhonchi  ABDOMEN: Soft, non-tender, non-distended EXTREMITIES:  No edema; No acute deformity     Asessement and Plan:.     ***     Disposition: F/u with ***  Signed, Rip Harbour, NP

## 2023-08-25 ENCOUNTER — Ambulatory Visit: Payer: Self-pay | Admitting: Cardiology

## 2023-08-25 DIAGNOSIS — I25810 Atherosclerosis of coronary artery bypass graft(s) without angina pectoris: Secondary | ICD-10-CM

## 2023-08-25 DIAGNOSIS — E7800 Pure hypercholesterolemia, unspecified: Secondary | ICD-10-CM

## 2023-08-25 DIAGNOSIS — I10 Essential (primary) hypertension: Secondary | ICD-10-CM

## 2023-08-27 ENCOUNTER — Other Ambulatory Visit: Payer: Self-pay | Admitting: Cardiology

## 2023-08-27 DIAGNOSIS — I10 Essential (primary) hypertension: Secondary | ICD-10-CM

## 2023-09-15 ENCOUNTER — Other Ambulatory Visit: Payer: Self-pay | Admitting: Cardiology

## 2023-09-19 ENCOUNTER — Other Ambulatory Visit: Payer: Self-pay | Admitting: Cardiology

## 2023-11-21 NOTE — Progress Notes (Signed)
 HPI: Follow-up CAD.  Patient ruled in for non-ST elevation myocardial infarction February 2022.  Cardiac catheterization revealed severe three-vessel coronary artery disease.  Echocardiogram showed normal LV function.  Patient had coronary artery bypass graft with LIMA to the LAD, saphenous vein graft to the PDA, distal circumflex and OM1.  Note preoperative carotid Dopplers showed 40 to 59% right carotid artery stenosis.  Following his discharge he returned due to slurred speech and left upper extremity weakness and was found to have CVA.  Repeat echocardiogram showed normal LV function.  Outpatient monitor April 2022 showed sinus rhythm with no atrial fibrillation.  Following his discharge he was seen in the office with 3 episodes of syncope that was felt likely to be orthostatic mediated.  He had 1 in the office and systolic blood pressure was in the 80s.  His medications were discontinued.  Carotid Dopplers May 2023 showed 1 to 39% bilateral stenosis.  Since last seen 12/23 patient denies dyspnea, chest pain, palpitations or syncope.  Current Outpatient Medications  Medication Sig Dispense Refill   acetaminophen  (TYLENOL ) 500 MG tablet Take 2 tablets (1,000 mg total) by mouth every 8 (eight) hours as needed for mild pain (or discomfort). 30 tablet 0   amitriptyline  (ELAVIL ) 25 MG tablet Take 1 tablet (25 mg total) by mouth at bedtime. 90 tablet 3   amLODipine  (NORVASC ) 10 MG tablet TAKE 1 TABLET BY MOUTH EVERY DAY 30 tablet 0   aspirin  EC 81 MG EC tablet Take 1 tablet (81 mg total) by mouth daily. Swallow whole. 30 tablet 11   atorvastatin  (LIPITOR ) 80 MG tablet TAKE 1 TABLET BY MOUTH EVERY DAY 30 tablet 2   carvedilol  (COREG ) 6.25 MG tablet Take 1 tablet (6.25 mg total) by mouth 2 (two) times daily. 180 tablet 3   chlorthalidone  (HYGROTON ) 25 MG tablet TAKE 1/2 TABLET BY MOUTH EVERY DAY 45 tablet 1   clopidogrel  (PLAVIX ) 75 MG tablet TAKE 1 TABLET BY MOUTH EVERY DAY 90 tablet 0   Multiple  Vitamins-Minerals (MULTIVITAMIN WITH MINERALS) tablet Take 1 tablet by mouth daily.     nitroGLYCERIN  (NITROSTAT ) 0.4 MG SL tablet Place 1 tablet (0.4 mg total) under the tongue every 5 (five) minutes as needed. 25 tablet 3   valsartan  (DIOVAN ) 320 MG tablet TAKE 1 TABLET BY MOUTH EVERY DAY 30 tablet 2   amitriptyline  (ELAVIL ) 25 MG tablet Take 1 tablet (25 mg total) by mouth at bedtime. (Patient not taking: Reported on 11/28/2023) 90 tablet 3   ezetimibe  (ZETIA ) 10 MG tablet TAKE 1 TABLET BY MOUTH EVERY DAY (Patient not taking: Reported on 11/28/2023) 90 tablet 3   No current facility-administered medications for this visit.     Past Medical History:  Diagnosis Date   CAD (coronary artery disease) of bypass graft    CABG x 4   Glaucoma    H/O: CVA (cerebrovascular accident) 2022   Hyperlipidemia LDL goal <70    Hypertension     Past Surgical History:  Procedure Laterality Date   CORONARY ARTERY BYPASS GRAFT N/A 07/08/2020   Procedure: CORONARY ARTERY BYPASS GRAFTING (CABG), ON PUMP, TIMES FOUR, LEFT INTERNAL MAMMARY ARTERY TO LAD, RIGHT SVG TO PDA, DISTAL CIRCUMFLEX, AND OM1;  Surgeon: Army Dallas NOVAK, MD;  Location: [facility] OR;  Service: Open Heart Surgery;  Laterality: N/A;   ENDOVEIN HARVEST OF GREATER SAPHENOUS VEIN Right 07/08/2020   Procedure: ENDOVEIN HARVEST OF RIGHT GREATER SAPHENOUS VEIN;  Surgeon: Army Dallas NOVAK, MD;  Location: [facility]  OR;  Service: Open Heart Surgery;  Laterality: Right;   LEFT HEART CATH AND CORONARY ANGIOGRAPHY N/A 07/02/2020   Procedure: LEFT HEART CATH AND CORONARY ANGIOGRAPHY;  Surgeon: Dann Candyce RAMAN, MD;  Location: [facility] INVASIVE CV LAB;  Service: Cardiovascular;  Laterality: N/A;   NO PAST SURGERIES     SHOULDER ARTHROSCOPY  04/12/2021   TEE WITHOUT CARDIOVERSION N/A 07/08/2020   Procedure: TRANSESOPHAGEAL ECHOCARDIOGRAM (TEE);  Surgeon: Army Dallas NOVAK, MD;  Location: [facility] OR;  Service: Open Heart Surgery;  Laterality: N/A;    Social History    Socioeconomic History   Marital status: Married    Spouse name: Tobias   Number of children: Not on file   Years of education: Not on file   Highest education level: Not on file  Occupational History   Not on file  Tobacco Use   Smoking status: Former   Smokeless tobacco: Former  Building services engineer status: Never Used  Substance and Sexual Activity   Alcohol use: Not Currently   Drug use: Not Currently   Sexual activity: Not on file  Other Topics Concern   Not on file  Social History Narrative   Lives with wife   Social Drivers of Corporate investment banker Strain: Not on file  Food Insecurity: Low Risk  (09/28/2023)   Received from [facility]   Hunger Vital Sign    Within the past 12 months, you worried that your food would run out before you got money to buy more: Never true    Within the past 12 months, the food you bought just didn't last and you didn't have money to get more. : Never true  Transportation Needs: No Transportation Needs (09/28/2023)   Received from [facility]    In the past 12 months, has lack of reliable transportation kept you from medical appointments, meetings, work or from getting things needed for daily living? : No  Physical Activity: Not on file  Stress: Not on file  Social Connections: Unknown (09/27/2021)   Received from [facility]   Social Network    Social Network: Not on file  Intimate Partner Violence: Unknown (09/01/2021)   Received from [facility]   HITS    Physically Hurt: Not on file    Insult or Talk Down To: Not on file    Threaten Physical Harm: Not on file    Scream or Curse: Not on file    Family History  Problem Relation Age of Onset   Hypertension Father    CAD Sister        diagnosed in her 37s   Heart attack Sister     ROS: no fevers or chills, productive cough, hemoptysis, dysphasia, odynophagia, melena, hematochezia, dysuria, hematuria, rash, seizure activity, orthopnea, PND, pedal  edema, claudication. Remaining systems are negative.  Physical Exam: Well-developed well-nourished in no acute distress.  Skin is warm and dry.  HEENT is normal.  Neck is supple.  Chest is clear to auscultation with normal expansion.  Cardiovascular exam is regular rate and rhythm.  Abdominal exam nontender or distended. No masses palpated. Extremities show no edema. neuro grossly intact  EKG Interpretation Date/Time:  Tuesday November 28 2023 14:18:51 EDT Ventricular Rate:  75 PR Interval:  176 QRS Duration:  96 QT Interval:  392 QTC Calculation: 437 R Axis:   7  Text Interpretation: Normal sinus rhythm Normal ECG Confirmed by Pietro Rogue ([id number]) on 11/28/2023 2:21:17 PM  A/P  1 coronary artery disease status post coronary artery bypass and graft-patient denies chest pain.  Plan to continue medical therapy with aspirin  and statin.  2 hypertension-blood pressure borderline but controlled at home.  Continue present medications and follow. 3 hyperlipidemia-continue Lipitor .  Most recent LDL April 2025 75.  Add Zetia  10 mg daily.  Check lipids and liver in 8 weeks.  4 history of CVA-continue aspirin  and Plavix .  Redell Shallow, MD

## 2023-11-28 ENCOUNTER — Encounter: Payer: Self-pay | Admitting: Cardiology

## 2023-11-28 ENCOUNTER — Ambulatory Visit: Attending: Cardiology | Admitting: Cardiology

## 2023-11-28 VITALS — BP 124/90 | HR 75 | Ht 69.0 in | Wt 247.0 lb

## 2023-11-28 DIAGNOSIS — I25810 Atherosclerosis of coronary artery bypass graft(s) without angina pectoris: Secondary | ICD-10-CM

## 2023-11-28 DIAGNOSIS — E7800 Pure hypercholesterolemia, unspecified: Secondary | ICD-10-CM | POA: Diagnosis not present

## 2023-11-28 DIAGNOSIS — I10 Essential (primary) hypertension: Secondary | ICD-10-CM | POA: Diagnosis not present

## 2023-11-28 MED ORDER — EZETIMIBE 10 MG PO TABS: 10.0000 mg | ORAL_TABLET | Freq: Every day | ORAL | 3 refills | Status: AC

## 2023-11-28 NOTE — Patient Instructions (Addendum)
 Medication Instructions:   START EZETIMIBE  10 MG ONCE A DAY  *If you need a refill on your cardiac medications before your next appointment, please call your pharmacy*  Lab Work: Your physician recommends that you return for lab work in: 8 WEEKS FASTING  If you have labs (blood work) drawn today and your tests are completely normal, you will receive your results only by: MyChart Message (if you have MyChart) OR A paper copy in the mail If you have any lab test that is abnormal or we need to change your treatment, we will call you to review the results.  Follow-Up: At [facility], you and your health needs are our priority.  As part of our continuing mission to provide you with exceptional heart care, our providers are all part of one team.  This team includes your primary Cardiologist (physician) and Advanced Practice Providers or APPs (Physician Assistants and Nurse Practitioners) who all work together to provide you with the care you need, when you need it.  Your next appointment:   12 month(s)  Provider:   Redell Shallow, MD

## 2023-11-30 ENCOUNTER — Encounter: Payer: Self-pay | Admitting: Cardiology

## 2023-11-30 MED ORDER — NITROGLYCERIN 0.4 MG SL SUBL: SUBLINGUAL_TABLET | SUBLINGUAL | 3 refills | Status: DC

## 2023-12-12 ENCOUNTER — Other Ambulatory Visit: Payer: Self-pay | Admitting: Cardiology

## 2023-12-21 ENCOUNTER — Other Ambulatory Visit: Payer: Self-pay | Admitting: Cardiology

## 2024-01-16 NOTE — Progress Notes (Unsigned)
 [facility] [address] [place]. [place] [address] [phone]       OFFICE FOLLOW UP NOTE  Leonard Donovan Donovan Date of Birth:  09/22/1965 Medical Record Number:  [id number]   Reason for visit: stroke and CPAP f/u    SUBJECTIVE:   CHIEF COMPLAINT:  No chief complaint on file.     HPI:   Update 01/17/2024 JM: Patient returns for yearly follow-up visit.  Overall stable from stroke standpoint without new stroke/TIA symptoms.  Residual LUE weakness, cognitive impairment and gait impairment stable.  Continues on amitriptyline  for mood which has been beneficial.  Remains on Social Security disability.  Reports compliance on aspirin , Plavix  and atorvastatin .  Routinely follows with PCP for stroke risk factor management.  CPAP compliance report over the past 30 days shows 100% compliance with average usage 8 hours and 54 minutes.  Residual AHI 2.7.  Pressure in the 95th percentile 10.6 on pressure setting of 5-13 with EPR 3.  Leaks in the 95th percentile 9.0.     History provided for reference purposes only Update 01/19/2023 JM: Patient returns for follow-up visit accompanied by his wife.  Stable from stroke standpoint without new stroke/TIA symptoms.  Residual LUE weakness, cognitive impairment and gait impairment stable. Continues to get fatigued quicker after increased exertion (compared to pre stroke). Remains on amitriptyline  25 mg nightly for mood stabilization, takes around 6pm due to causing morning grogginess taking at bedtime. Does still have some anxiety at times but no longer having extreme emotional fluctuations.  Approved for Social Security disability in 06/2022.  On aspirin , Plavix  and atorvastatin .  Routinely follows with PCP and cardiology.   CPAP compliance report over the past 30 days shows excellent usage at 100% compliance and 97% compliance greater than 4 hours.  Residual AHI 5.7.  Pressure in the 95th percentile 10.8 on set pressure of 5-13 with  EPR level 3.  Leaks in the 98th percentile 8.6.  Overall tolerate CPAP well but isn't a huge fan of it.  For the most part he sleeps well and daytime energy levels satisfactory. DME [employer], up to date on supplies.   Update 04/20/2022 JM: Patient returns for 74-month follow-up accompanied by his wife. At prior visit, reported increased left hand weakness, worsening dysphagia and worsening cognition.  Completed MRI brain which did not show any acute findings or new findings compared to prior imaging, did mention possible outpouching of basilar tip vs artifact, completed CTA head which did not show any evidence of aneurysms or abnormal findings. Reports gradual improvement since that time. Denies any further worsening or new stroke/TIA symptoms. No recent issues with swallowing. Reports improvement of anxiety and heightened emotions on amitriptyline  25 mg nightly. Does need to take in the evening as he experienced morning grogginess when taking at bedtime.   He has remained on both aspirin  and Plavix  as well as atorvastatin  Blood pressure well controlled, slightly elevated today but believes this was because he ate pork yesterday Closely follows with PCP Dr. Shlomo and cardiologist Dr. Pietro  CPAP compliance report over the past 30 days shows 30 out of 30 usage days and 28 days greater than 4 hours for 93% compliance.  Average usage 6 hours and 52 minutes.  Residual AHI 3.5.  Pressure in the 95th percentile 10.6 on pressure settings of 5-13 with EPR level 3.  Leaks in the 95th percentile 2.0.  Reports relatively doing well on CPAP, does not necessarily like using understands importance of continued use.  Feels well rested in the morning and usually sleeps well throughout the night.  Update 12/07/2021 JM: Patient returns for 57-month stroke follow-up accompanied by his wife. Reports over the past 2-3 weeks, patient has had more difficulty holding objects with left hand, increased drooling and more  episodes of choking (usually with eating/drinking but can also be on his saliva). Wife also notes possible worsening of concentration and forgetfulness and worsening anxiety/irritability with panic attacks  as well as periods of inappropriate laughing or crying.  Slurred speech still present and can worsen with fatigue which is his baseline. Continued dizziness and imbalance at baseline. Occasional falls still but has been less compared to prior visit, still without any significant injury.  Patient does note experiencing a severe right occipital headache at night about 2 weeks ago, described as a sharp stabbing pain that lasted about 2 minutes then resolved. He informed his wife the next morning. He had a mild headache a couple days later in the same area but nothing since that time.   Reports compliance on aspirin  and Plavix  without side effects. Of note, did hold plavix  for scheduled colonoscopy 5 days prior to procedure on 6/16 but unable to complete colonoscopy as he was not cleared out enough. He did remain on aspirin  during that duration.  Compliant on atorvastatin , denies side effects.  Blood pressure today 135/96.  Did have some BP med adjustment back in May and has been stable since that time, denies any low BP readings at home. Reports continued nightly CPAP compliance. Did not discuss fully today due to time constraint.  No further concerns at this time.  Update 06/09/2020 [name] returns for initial CPAP compliance visit and stroke follow up. Previously seen 4 months ago for stroke f/u. Completed HST 02/24/2021 which showed severe OSA with strong REM sleep component with total AHI 53.2/h and recommended initiating AutoPap which was started on 03/25/2021.  Compliance report from 05/09/2021 -06/07/2021 shows 24 out of 30 usage days for 80% compliance with 20 days greater than 4 hours per 67% compliance.  Average usage 5 hours and 18 minutes.  Residual AHI 10.7.  Leaks in the 95th percentile 5.8  (max 41.9).  Pressure in the 95th percentile 12.6 with mean pressure 6 and max pressure 18 and EPR level 3. Has difficulty tolerating pressure at times as he feels he cannot breathe and also c/o leaks around his mask. Does notice some improvement of daytime fatigue and sleeping better at night.  Otherwise tolerating mask well.  Current use of full facemask.  Epworth Sleepiness Scale 9.  Stable from stroke standpoint - residual mild LUE weakness, cognitive impairment and gait impairment with imbalance stable. Does note some improvement of LUE strength especially left hand since s/p left rotator cuff repair and procedure for adhesive capsulitis and subacromial decompression of left shoulder on 04/12/2021 by Dr. Runell. Working with PT with gradual improvement. Gradually trying to drive - still having occultly with inattention. Recurrent falls thankfully without severe injury or hitting head - usually on uneven ground (walking in the woods). Tried Nuedexta  for suspected PBA - felt this caused worsening depression. Still occasionally laughing at inappropriate times but more so seems to be heightened emotions. Remains on disability - in the process of applying for Social Security disability.  Denies new stroke/TIA symptoms.  Compliant on aspirin , Plavix , atorvastatin  and Zetia  without side effects.  Blood pressure today 155/95.  Routinely follows with PCP and cardiology.  No new concerns at this time.   Update  02/16/2021 JM: Leonard Donovan Donovan returns for 59-month stroke follow-up accompanied by his wife, Tobias.  Overall doing well.  Completed SLP 8/16 as he met all goals and only rare word finding difficulty and occasional deviations in attention and reduced awareness.  Left-sided weakness with some improvement since prior visit.  Followed by orthopedics for adhesive capsulitis and currently working with PT noting some improvement. Continued occassional imbalance and occasional swallowing difficulties but seems to be more  related to inattention.  He has not previously worked with PT for imbalance as he was previously working with PT for cardiac rehab. Per wife, continued uncontrolled crying and occasional uncontrolled laughing -previously discussed Nuedexta  for PBA -declined interest at that time but now interested in trialing.  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  Denies new stroke/TIA symptoms.  Remains on aspirin  and Plavix  as well as atorvastatin  and zetia  without side effects.  Blood pressure today 155/98. Routinely monitors at home - cardiology recently increased BP meds last week.  Evaluated by Dr. Chalice for concern of possible sleep apnea and plans on completing HST tonight.  No further concerns at this time  Update 11/24/2020 JM: Leonard Donovan Donovan returns for 79-month stroke follow-up accompanied by his wife.  Patient reports residual left-sided weakness/pain, occasional imbalance and fatigue but overall improving.  Currently working with OT for residual left-sided weakness and shoulder pain -currently being followed by orthopedics with MRI completed yesterday and has scheduled visit next week for further evaluation due to continued shoulder pain interfering with therapy sessions as well as sleep.  Also continues to work with SLP for aphasia, dysarthria and cognitive communication deficit.  Completed MBS 4/5 which was normal although he does have occasional difficulty swallowing water.  Denies new stroke/TIA symptoms. Remains on aspirin  and Plavix  as well as atorvastatin  and zetia  without associated side effects.  Blood pressure today 125/83. Completed 30-day cardiac event monitor which was negative for atrial fibrillation. He does report daytime fatigue as well as insomnia -has not previously underwent sleep study.  Wife is concerned regarding labile emotions where he may laugh or cry uncontrollably during situations that may not be that funny or sad.  No further concerns at this time.  Initial visit  08/24/2020 JM: Leonard Donovan Donovan is being seen for hospital follow-up accompanied by his wife  Reports residual left-sided weakness, left facial droop, swallowing difficulties and cognitive difficulties Evaluated by [place] SLP 3/7 - noted mild cognitive linguistic deficits; no concerns of aspiration -personally reviewed evaluation note -no further SLP recommended as patient and wife reported that he was at baseline At todays visit, wife reports occasional confusion and delayed processing which patient agrees with Report of swallowing difficulties only with water otherwise denies difficulty Denies new stroke/TIA symptoms 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 Has cards f/u on Wednesday Currently wearing cardiac monitor which will be completed on 4/3  No further concerns at this time  Stroke admission 07/20/2020 Leonard Donovan Donovan is a 58 y.o. male with history of hypertension, coronary artery disease (s/p quadruple CABG on 07/08/2020), glaucoma, COVID-19 infection (sx on 06/07/2020, dx on 06/18/2020),  who presented on  07/20/2020 with left sided facial droop, slurred speech, and left upper extremity weakness.   Personally reviewed hospitalization pertinent progress notes, lab work and imaging with summary provided.  Evaluated by Dr. Jerri with stroke work-up revealing multifocal infarcts, largest at right pontine, likely related to recent cardiac surgery however other cardioembolic source cannot be ruled out.  Recommended 30-day cardiac event monitor outpatient to rule out A. fib.  On DAPT PTA and  recommended continuation at discharge per cardiology recommendations s/p CABG 07/08/2020.  LDL 49 on atorvastatin  80 mg daily.  Other stroke risk factors include former tobacco use, obesity and suspected OSA.  Residual deficit of mild left facial droop, left hemiparesis and moderate cognitive deficits.  Stroke:  Multifocal infarcts, largest at right pontine, likely related to recent cardiac surgery. However, other cardioembolic source can not rule out Code Stroke CT head: Small focus of hypodensity in the right white matter. CTA head & neck: no intracranial arterial occlusion or high-grade stenosis. Bilateral carotid bifurcation atherosclerosis, right more than left MRI Acute right pontine infarct in addition to multiple small acute to early subacute infarcts within the bilateral frontal and parietal white matter, consistent with a central (cardiac or aortic) embolic source. No hemorrhage or mass effect. Recommend 30 day monitor to evaluate for atrial fibrillation as the source for stroke 2D Echo: EF 60 to 65% LDL 49 HgbA1c 5.6 VTE prophylaxis - Lovenox  40mg  daily Swallow eval: passed for heart healthy diet aspirin  81 mg daily and Plavix  prior to admission, continue aspirin  81 mg daily and clopidogrel  75 mg daily DAPT on discharge. Therapy recommendations:  outpt SLP Disposition: home         ROS:   14 system review of systems performed and negative with exception of those listed in HPI  PMH:  Past Medical History:  Diagnosis Date   CAD (coronary artery disease) of bypass graft    CABG x 4   Glaucoma    H/O: CVA (cerebrovascular accident) 2022   Hyperlipidemia LDL goal <70    Hypertension     PSH:  Past Surgical History:  Procedure Laterality Date   CORONARY ARTERY BYPASS GRAFT N/A 07/08/2020   Procedure: CORONARY ARTERY BYPASS GRAFTING (CABG), ON PUMP, TIMES FOUR, LEFT INTERNAL MAMMARY ARTERY TO LAD, RIGHT SVG TO PDA, DISTAL CIRCUMFLEX, AND OM1;  Surgeon: Army Dallas NOVAK,  MD;  Location: [facility] OR;  Service: Open Heart Surgery;  Laterality: N/A;   ENDOVEIN HARVEST OF GREATER SAPHENOUS VEIN Right 07/08/2020   Procedure: ENDOVEIN HARVEST OF RIGHT GREATER SAPHENOUS VEIN;  Surgeon: Army Dallas NOVAK, MD;  Location: [facility] OR;  Service: Open Heart Surgery;  Laterality: Right;   LEFT HEART CATH AND CORONARY ANGIOGRAPHY N/A 07/02/2020   Procedure: LEFT HEART CATH AND CORONARY ANGIOGRAPHY;  Surgeon: Dann Candyce RAMAN, MD;  Location: [facility] INVASIVE CV LAB;  Service: Cardiovascular;  Laterality: N/A;   NO PAST SURGERIES     SHOULDER ARTHROSCOPY  04/12/2021   TEE WITHOUT CARDIOVERSION N/A 07/08/2020   Procedure: TRANSESOPHAGEAL ECHOCARDIOGRAM (TEE);  Surgeon: Army Dallas NOVAK, MD;  Location: [facility] OR;  Service: Open Heart Surgery;  Laterality: N/A;    Social History:  Social History   Socioeconomic History   Marital status: Married    Spouse name: Tobias   Number of children: Not on file   Years of education: Not on file   Highest education level: Not on file  Occupational History   Not on file  Tobacco Use   Smoking status: Former   Smokeless  tobacco: Former  Building services engineer status: Never Used  Substance and Sexual Activity   Alcohol use: Not Currently   Drug use: Not Currently   Sexual activity: Not on file  Other Topics Concern   Not on file  Social History Narrative   Lives with wife   Social Drivers of Corporate investment banker Strain: Not on file  Food Insecurity: Low Risk  (09/28/2023)   Received from [facility]   Hunger Vital Sign    Within the past 12 months, you worried that your food would run out before you got money to buy more: Never true    Within the past 12 months, the food you bought just didn't last and you didn't have money to get more. : Never true  Transportation Needs: No Transportation Needs (09/28/2023)   Received from [facility]    In the past 12 months, has lack of reliable transportation kept you from  medical appointments, meetings, work or from getting things needed for daily living? : No  Physical Activity: Not on file  Stress: Not on file  Social Connections: Unknown (09/27/2021)   Received from [facility]   Social Network    Social Network: Not on file  Intimate Partner Violence: Unknown (09/01/2021)   Received from [facility]   HITS    Physically Hurt: Not on file    Insult or Talk Down To: Not on file    Threaten Physical Harm: Not on file    Scream or Curse: Not on file    Family History:  Family History  Problem Relation Age of Onset   Hypertension Father    CAD Sister        diagnosed in her 64s   Heart attack Sister     Medications:   Current Outpatient Medications on File Prior to Visit  Medication Sig Dispense Refill   acetaminophen  (TYLENOL ) 500 MG tablet Take 2 tablets (1,000 mg total) by mouth every 8 (eight) hours as needed for mild pain (or discomfort). 30 tablet 0   amitriptyline  (ELAVIL ) 25 MG tablet Take 1 tablet (25 mg total) by mouth at bedtime. (Patient not taking: Reported on 11/28/2023) 90 tablet 3   amitriptyline  (ELAVIL ) 25 MG tablet Take 1 tablet (25 mg total) by mouth at bedtime. 90 tablet 3   amLODipine  (NORVASC ) 10 MG tablet TAKE 1 TABLET BY MOUTH EVERY DAY 30 tablet 10   aspirin  EC 81 MG EC tablet Take 1 tablet (81 mg total) by mouth daily. Swallow whole. 30 tablet 11   atorvastatin  (LIPITOR ) 80 MG tablet TAKE 1 TABLET BY MOUTH EVERY DAY 90 tablet 3   carvedilol  (COREG ) 6.25 MG tablet Take 1 tablet (6.25 mg total) by mouth 2 (two) times daily. 180 tablet 3   chlorthalidone  (HYGROTON ) 25 MG tablet TAKE 1/2 TABLET BY MOUTH EVERY DAY 45 tablet 1   clopidogrel  (PLAVIX ) 75 MG tablet TAKE 1 TABLET BY MOUTH EVERY DAY 90 tablet 3   ezetimibe  (ZETIA ) 10 MG tablet Take 1 tablet (10 mg total) by mouth daily. 90 tablet 3   Multiple Vitamins-Minerals (MULTIVITAMIN WITH MINERALS) tablet Take 1 tablet by mouth daily.     nitroGLYCERIN  (NITROSTAT ) 0.4 MG SL  tablet Dissolve 1 tablet under the tongue every 5 minutes as needed for chest pain. Max of 3 doses, then 911. 25 tablet 3   valsartan  (DIOVAN ) 320 MG tablet TAKE 1 TABLET BY MOUTH EVERY DAY 90 tablet 3  No current facility-administered medications on file prior to visit.    Allergies:  No Known Allergies    OBJECTIVE:  Physical Exam  There were no vitals filed for this visit.  There is no height or weight on file to calculate BMI. No results found.  General: well developed, well nourished, very pleasant middle-age Caucasian male, seated, in no evident distress Head: head normocephalic and atraumatic.   Neck: supple with no carotid or supraclavicular bruits Cardiovascular: regular rate and rhythm, no murmurs Musculoskeletal: no deformity Skin:  no rash/petichiae Vascular:  Normal pulses all extremities   Neurologic Exam Mental Status: Awake and fully alert. Occasional word finding difficulty.  Follows commands without difficulty.  No evidence of dysarthria.  Oriented to place and time. Recent and remote memory intact. Attention span, concentration and fund of knowledge fluctuated during visit. Mood and affect appropriate.   Cranial Nerves: Pupils equal, briskly reactive to light. Extraocular movements full without nystagmus. Visual fields full to confrontation. Hearing intact. Facial sensation intact.  Mild left lower facial weakness.  Tongue and palate moves normally and symmetrically.  Motor: Normal bulk and tone and strength right upper and lower extremity LUE: 4+/5 grip strength otherwise 5/5 LLE: 5/5 Sensory.: intact to touch , pinprick , position and vibratory sensation.  Coordination: Rapid alternating movements normal in all extremities except very slight decreased left hand. Finger-to-nose slightly impaired LUE and heel-to-shin performed accurately bilaterally.  Orbits right arm over left arm. Gait and Station: Arises from chair without difficulty. Stance is normal. Gait  demonstrates normal stride length without use of assistive device. Tandem walk and heel toe without difficulty Reflexes: 1+ and symmetric. Toes downgoing.        ASSESSMENT: Leonard Donovan Donovan is a 58 y.o. year old male with multifocal infarcts on 07/20/2020, largest in the right pontine, likely related to cardiac surgery (s/p quadruple CABG 2/9).  Vascular risk factors include HTN, HLD, CAD s/p CABG 06/2020, COVID-19 infection 05/2020, former tobacco use and severe OSA now on CPAP. S/p left shoulder rotator cuff repair, manipulation for adhesive capsulitis and subacromial decompression 04/12/2021 by Dr. Duwaine. C/o worsening stroke deficits 11/2021 with MRI brain no acute infarct and has been gradually improving since that time.      PLAN:  Multifocal infarcts:  Residual deficit: mild left hand weakness, cognitive impairment (high level executive functioning and inattention), left facial weakness and gait impairment with imbalance.  Stable since prior visit.  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  Advised to call if this should worsen to consider dosage increase. Takes at 6pm, caused morning grogginess when taking at bedtime. Refill provided.  Intolerant to Nuedexta  Now on Social Security disability Cardiac event monitor negative for atrial fibrillation Continue aspirin  81 mg daily, Plavix  and atorvastatin  for secondary stroke prevention and per cardiology recommendations, medications refilled/managed by PCP/cardiology Discussed secondary stroke prevention measures and importance of close PCP follow up for aggressive stroke risk factor management including BP goal<130/90, and HLD with LDL goal<70 LDL 69 (08/2022)  Severe OSA:  Compliance report shows satisfactory usage, residual AHI 5.7.  Recommend continued pressure settings at this time.  Discussed continued nightly usage with ensuring  greater than 4 hours nightly for optimal benefit and per insurance purposes.  Continue to follow with DME company for any needed supplies or CPAP related concerns HST 01/2021 severe OSA with total AHI 53.2/h accentuated during REM sleep with AHI 78.7/h  CPAP set up date 03/25/2021      Follow up in 1 year or call earlier if needed   CC:  PCP: Shlomo Darryle BROCKS, DO    I spent 25 minutes of face-to-face and non-face-to-face time with patient and wife.  This included previsit chart review, lab review, study review, order entry, electronic health record documentation, and patient and wife education and discussion regarding above diagnoses and treatment plan and answered all the questions to patient wife satisfaction  Harlene Bogaert, AGNP-BC  [facility] [address] [place], [place] [address]  Phone [phone] Fax [phone] Note: This document was prepared with digital dictation and possible smart phrase technology. Any transcriptional errors that result from this process are unintentional.

## 2024-01-18 ENCOUNTER — Ambulatory Visit: Payer: BC Managed Care – PPO | Admitting: Adult Health

## 2024-01-18 ENCOUNTER — Encounter: Payer: Self-pay | Admitting: Adult Health

## 2024-01-18 VITALS — BP 120/68 | HR 68 | Ht 70.0 in | Wt 254.4 lb

## 2024-01-18 DIAGNOSIS — I639 Cerebral infarction, unspecified: Secondary | ICD-10-CM | POA: Diagnosis not present

## 2024-01-18 DIAGNOSIS — I69398 Other sequelae of cerebral infarction: Secondary | ICD-10-CM

## 2024-01-18 DIAGNOSIS — F0630 Mood disorder due to known physiological condition, unspecified: Secondary | ICD-10-CM

## 2024-01-18 DIAGNOSIS — G4733 Obstructive sleep apnea (adult) (pediatric): Secondary | ICD-10-CM | POA: Diagnosis not present

## 2024-01-18 DIAGNOSIS — F482 Pseudobulbar affect: Secondary | ICD-10-CM

## 2024-01-18 MED ORDER — AMITRIPTYLINE HCL 25 MG PO TABS: 25.0000 mg | ORAL_TABLET | Freq: Every day | ORAL | 4 refills | Status: AC

## 2024-02-03 ENCOUNTER — Ambulatory Visit: Payer: Self-pay | Admitting: Cardiology

## 2024-02-03 DIAGNOSIS — R748 Abnormal levels of other serum enzymes: Secondary | ICD-10-CM

## 2024-02-03 LAB — LIPID PANEL
Chol/HDL Ratio: 3 ratio (ref 0.0–5.0)
Cholesterol, Total: 127 mg/dL (ref 100–199)
HDL: 43 mg/dL (ref >39)
LDL Chol Calc (NIH): 63 mg/dL (ref 0–99)
Triglycerides: 118 mg/dL (ref 0–149)
VLDL Cholesterol Cal: 21 mg/dL (ref 5–40)

## 2024-02-03 LAB — HEPATIC FUNCTION PANEL
ALT: 20 [IU]/L (ref 0–44)
AST: 27 [IU]/L (ref 0–40)
Albumin: 4.6 g/dL (ref 3.8–4.9)
Alkaline Phosphatase: 124 [IU]/L — ABNORMAL HIGH (ref 44–121)
Bilirubin Total: 0.8 mg/dL (ref 0.0–1.2)
Bilirubin, Direct: 0.26 mg/dL (ref 0.00–0.40)
Total Protein: 7.3 g/dL (ref 6.0–8.5)

## 2024-02-26 LAB — GAMMA GT: GGT: 30 IU/L (ref 0–65)

## 2024-02-26 LAB — NUCLEOTIDASE, 5', BLOOD: 5-Nucleotidase: 3 IU/L (ref 0–11)

## 2024-03-06 ENCOUNTER — Other Ambulatory Visit: Payer: Self-pay | Admitting: Cardiology

## 2024-03-06 DIAGNOSIS — I10 Essential (primary) hypertension: Secondary | ICD-10-CM

## 2024-04-10 ENCOUNTER — Other Ambulatory Visit: Payer: Self-pay | Admitting: Cardiology

## 2025-01-16 ENCOUNTER — Ambulatory Visit: Admitting: Adult Health
